# Patient Record
Sex: Female | Born: 1965 | Race: Black or African American | Hispanic: No | Marital: Single | Smoking: Never smoker
Health system: Southern US, Community
[De-identification: ages and names within clinical notes are randomized; demographics above are authoritative.]

## PROBLEM LIST (undated history)

## (undated) DIAGNOSIS — E119 Type 2 diabetes mellitus without complications: Secondary | ICD-10-CM

## (undated) DIAGNOSIS — G8250 Quadriplegia, unspecified: Secondary | ICD-10-CM

## (undated) DIAGNOSIS — I10 Essential (primary) hypertension: Secondary | ICD-10-CM

## (undated) HISTORY — PX: JOINT REPLACEMENT: SHX530

## (undated) HISTORY — PX: CERVICAL SPINE SURGERY: SHX589

---

## 2019-09-26 NOTE — ED Provider Notes (Signed)
 [facility] Emergency Department Provider Note  Initial Impression/Assessment and Plan/ED Course   Wendy Nelson is a 54 y.o. female who p/w SOB. Exam: VS WNL. Well-appearing obese middle-aged female. NAD. Speaks in complete sentences. WOB normal. LLL field crackles. No other adventitious lung sounds. No m/r/g. No JVD. Abdo soft, benign. Lwr ext symmetric and non-edematous. Consider PE, new onset CHF, URI, ACS, other. Plan: ECG, CBM, CT PE, BNP, C19 rapid, other basic labs, reassess.    12:55 PM ECG (my interpretation): NSR (HR 70), PR/QRS/QT intervals nml, QW V1-2, NAIC.    1:31 PM CBC w/o anemia or leukocytosis. Rapid C19 negative.   3:15 PM CMP w/ glucose 69, other values not acutely concerning. Trop neg. Minor elevation CK - 820. BNP 50. Unlikely CHF. Will encourage PO hydration, given this.   4:00 PM CT PE read as 1. No evidence of pulmonary emboli. 2. Scattered mild groundglass opacities of the periphery of both lungs with differential considerations including atypical infection, unusual  inflammatory conditions, and atypical edema. 3. Deep tissue locules of the right chest wall. Correlation for any history of trauma or other explanation is recommended.   5:08 PM Procalc < 0.10. ABX not recommended. Unclear etiology of GGOs. PT updated re: above. Will discharge w/ strict return precautions and recommendation that pt f/u w/ Pulmonology per my referral. No e/o acute life-threatening process at this time.   Exam, labs, imaging results, interventions, and POC discussed w/ pt (and/or family/caregivers) who understand and agree and have no further questions. Given above, will proceed w/ stated plan.  Chief Complaint  Shortness of Breath  History of Present Illness  Wendy Nelson is a 54 y.o. female c PMH CAD, HTN, HLD, obesity, GERD, left knee melanoma, needle phobia, and anxiety who p/w SOB. Onset 3d ago. No rhinorrhea, or sore throat. Nighttime congestion and cough productive of  clear sputum. No fever. No chest pain. No orthopnea. 5# weight gain in last month. No lower extremity swelling. Recently quit smoking cigarettes 4d ago. No vaping or cannabis. Pt endorses audible wheezing. Does not carry formal dx of asthma. Of note, pt had rotator cuff surgery 3d ago as well. No complications reported. Not currently in pain. No previous VTE.   Past Medical History/Past Surgical History   Past Medical History:  Diagnosis Date  . Anxiety due to invasive procedure   . Cancer (CMS-HCC)    melanona left knee removed in 2002  . Coronary artery disease involving native coronary artery of native heart without angina pectoris 03/07/2017  . GERD (gastroesophageal reflux disease)   . Hyperlipidemia 11/29/2016  . Hypertension   . Motion sickness   . Needle phobia   . Obesity (BMI 30-39.9), unspecified   . Primary osteoarthritis of right knee 12/01/2014  . Vision abnormalities    Past Surgical History:  Procedure Laterality Date  . ARTHROPLASTY TOTAL KNEE Right 12/13/2016   Procedure: ARTHROPLASTY, RIGHT KNEE, CONDYLE AND PLATEAU; MEDIAL AND LATERAL COMPARTMENTSWITH OR WITHOUT PATELLA RESURFACING (TOTAL KNEE ARTHROPLASTY);  Surgeon: Jayson Alm Bars, MD;  Location: [facility] OR;  Service: Orthopedics;  Laterality: Right;  . ARTHROSCOPIC ROTATOR CUFF REPAIR Right 09/24/2019   Procedure: R- ARTHROSCOPY, SHOULDER, SURGICAL; WITH ROTATOR CUFF REPAIR;  Surgeon: Klifto, Christopher Scott, MD;  Location: [facility] OR;  Service: Orthopedics;  Laterality: Right;  . ARTHROSCOPIC SUBACROMIAL DECOMP Right 09/24/2019   Procedure: R- ARTHROSCOPY, SHOULDER, SURGICAL; DECOMPRESSION SUBACROMIAL SPACE W/PARTIAL ACROMIOPLASTY, W/CORACOACROMIAL LIGAMENT RELEASE, WHEN PERFORMED (LIST IN ADDITION TO PRIMARY PROCEDURE);  Surgeon:  Klifto, Lonni Hamilton, MD;  Location: [facility] OR;  Service: Orthopedics;  Laterality: Right;  . ARTHROSCOPY KNEE W/LYSIS ADHESIONS Right 04/18/2017   Procedure: ARTHROSCOPY, RIGHT KNEE,  SURGICAL; WITH LYSIS OF ADHESIONS, WITH MANIPULATION (SEPARATE PROCEDURE);  Surgeon: Taft Jayson Lenis, MD;  Location: [facility] OR;  Service: Orthopedics;  Laterality: Right;  . ARTHROSCOPY SHOULDER W/DEBRIDEMENT Right 09/24/2019   Procedure: R- ARTHROSCOPY, SHOULDER, SURGICAL; DEBRIDEMENT, EXTENSIVE, 3 OR MORE DISCRETE STRUCTURES;  Surgeon: Sandralee Lonni Hamilton, MD;  Location: [facility] OR;  Service: Orthopedics;  Laterality: Right;  . BONE SPUR REMOVAL RIGHT FOOT    . cardiac catheterization  12/2016  . CESAREAN SECTION     THREE  . COLONOSCOPY  2020   diverticuli  . CYST REMOVAL RIGHT WRIST Right   . ESOPHAGOGASTRODOUDENOSCOPY W/BIOPSY N/A 01/09/2019   Procedure: EGD;  Surgeon: Merle Norleen Ricardo Mickey., MD;  Location: [facility];  Service: Gastroenterology;  Laterality: N/A;  . EXCISION TUMOR SOFT TISSUE FOREARM &/OR WRIST Right 05/19/2019   Procedure: EXCISION, TUMOR, SOFT TISSUE OF FOREARM AND/OR WRIST;  Surgeon: Harry Lenis Shaw, MD;  Location: [facility] OR;  Service: Orthopedics;  Laterality: Right;  . JOINT REPLACEMENT Right    knee  . KNEE ARTHROSCOPY Right   . MANIPULATION JOINT UNDER ANESTHESIA KNEE Right 02/07/2017   Procedure: MANIPULATION OF KNEE JOINT UNDER GENERAL ANESTHESIA (INCLUDES APPLICATION OF TRACTION OR OTHER FIXATION DEVICES);  Surgeon: Jayson Lenis Taft, MD;  Location: [facility] OR;  Service: Orthopedics;  Laterality: Right;  . MANIPULATION JOINT UNDER ANESTHESIA KNEE Right 04/18/2017   Procedure: MANIPULATION OF RIGHT KNEE JOINT UNDER GENERAL ANESTHESIA (INCLUDES APPLICATION OF TRACTION OR OTHER FIXATION DEVICES);  Surgeon: Taft Jayson Lenis, MD;  Location: [facility] OR;  Service: Orthopedics;  Laterality: Right;  . NEUROPLASTY &/OR TRANSPOSITION MEDIAN NERVE AT CARPAL TUNNEL Right 01/13/2019   Procedure: NEUROPLASTY AND/OR TRANSPOSITION; MEDIAN NERVE AT CARPAL TUNNEL;  Surgeon: Rocklin Maude Charleston, MD;  Location: [facility] OR;  Service: Neurosurgery;  Laterality: Right;  .  SKIN BIOPSY Left    leg - melanoma  . TENODESIS BICEPS W/TRANSPLANTATION LONG TENDON OPEN Right 09/24/2019   Procedure: R- TENODESIS OF LONG TENDON OF BICEPS;  Surgeon: Klifto, Christopher Scott, MD;  Location: [facility] OR;  Service: Orthopedics;  Laterality: Right;  . TUBAL LIGATION      Medications   Current Outpatient Medications:  .  amlodipine besylate (AMLODIPINE ORAL), Take 5 mg by mouth every morning   , Disp: , Rfl:  .  aspirin  81 MG EC tablet, Take 1 tablet (81 mg total) by mouth 2 (two) times daily for 30 days, Disp: 60 tablet, Rfl: 0 .  atorvastatin  (LIPITOR) 10 MG tablet, Take 10 mg by mouth once daily, Disp: , Rfl:  .  celecoxib (CELEBREX) 200 MG capsule, Take 1 capsule (200 mg total) by mouth 2 (two) times daily for 14 days, Disp: 28 capsule, Rfl: 0 .  cyclobenzaprine (FEXMID) 7.5 MG tablet, Take 1 tablet (7.5 mg total) by mouth once daily as needed for Muscle spasms Take in evening, Disp: 30 tablet, Rfl: 5 .  dicyclomine  (BENTYL ) 10 mg capsule, Take 1 capsule (10 mg total) by mouth 4 (four) times daily before meals and nightly, Disp: 30 capsule, Rfl: 11 .  DULoxetine  (CYMBALTA ) 30 MG DR capsule, Take 1 capsule (30 mg total) by mouth once daily, Disp: 90 capsule, Rfl: 2 .  fluorometholone  (FML) 0.1 % ophthalmic suspension, 1 drop as needed   , Disp: , Rfl:  .  hydroCHLOROthiazide (HYDRODIURIL) 25 MG  tablet, Take 25 mg by mouth every morning   , Disp: , Rfl:  .  losartan (COZAAR) 100 MG tablet, Take 100 mg by mouth every morning   , Disp: , Rfl:  .  omeprazole (PRILOSEC) 20 MG DR capsule, Take 20 mg by mouth once daily   , Disp: , Rfl:  .  ondansetron  (ZOFRAN ) 4 MG tablet, once daily as needed, Disp: , Rfl:  .  ondansetron  (ZOFRAN ) 4 MG tablet, Take 1 tablet (4 mg total) by mouth every 8 (eight) hours as needed for Nausea for up to 7 days, Disp: 20 tablet, Rfl: 0 .  oxyCODONE  (ROXICODONE ) 5 MG immediate release tablet, Take 1-2 tablets (5-10 mg total) by mouth every 4 (four) hours as  needed for Pain (1 tab moderate pain, 2 tabs severe pain) for up to 30 doses, Disp: 42 tablet, Rfl: 0 .  pregabalin  (LYRICA ) 150 MG capsule, Take 1 capsule (150 mg total) by mouth 2 (two) times daily, Disp: 60 capsule, Rfl: 5 .  sennosides (SENOKOT) 8.6 mg tablet, Take 1 tablet by mouth once daily, Disp: 30 tablet, Rfl: 0 .  traZODone  (DESYREL ) 50 MG tablet, Take 1 tablet (50 mg total) by mouth nightly as needed for Sleep, Disp: 90 tablet, Rfl: 2  Allergies  Melatonin and Neomycin sulfate  Family History   Family History  Problem Relation Age of Onset  . Diabetes type II Mother   . High blood pressure (Hypertension) Mother   . Diabetes Brother        diabetic coma twin brother  . Anesthesia problems Neg Hx   . Malignant hyperthermia Neg Hx     Social History   Social History   Tobacco Use  . Smoking status: Former Smoker    Packs/day: 0.25    Years: 15.00    Pack years: 3.75    Types: Cigarettes    Quit date: 09/23/2019  . Smokeless tobacco: Never Used  . Tobacco comment: 4-5 cigarettes a day  Substance Use Topics  . Alcohol  use: Yes    Alcohol /week: 6.0 standard drinks    Types: 6 Cans of beer per week    Drinks per session: 1 or 2    Binge frequency: Never    Comment: occ  . Drug use: No    Review of Systems   Constitutional: Negative for fever. Eyes: Negative for visual changes. ENT: Negative for sore throat. Positive for cough and congestion.  Cardiovascular: Negative for chest pain. Respiratory: Positive for shortness of breath. Gastrointestinal: Negative for abdominal pain, vomiting or diarrhea. Genitourinary: Negative for dysuria. Musculoskeletal: Negative for back pain. Skin: Negative for rash. Neurological: Negative for headache, focal weakness or numbness.  All ROS reviewed and otherwise negative  Physical Exam   VITAL SIGNS:   ED Triage Vitals [09/26/19 1203]  Enc Vitals Group     BP 124/85     Heart Rate 66     Resp 18     Temp 36.6 C  (97.9 F)     Temp Source Oral     SpO2 98 %     Weight      Height      Head Circumference      Peak Flow      Pain Score      Pain Loc      Pain Edu?      Excl. in GC?     Constitutional: Well-appearing obese middle-aged female. NAD. AOx3.  Eyes: PERRL. Conjunctiva clear.  HEENT: NCAT. MMM. Trachea midline.  Cardiovascular: No m/r/g. 2+ symmetric radial pulses. Respiratory: Speaks in complete sentences. WOB normal. LLL field crackles. No other adventitious lung sounds. Gastrointestinal: No TTP. No peritoneal signs.  Musculoskeletal: No ext edema or TTP. Neurologic: No gross focal deficits. MAE.  Skin: Warm, dry, intact.  Psychiatric: Mood/affect normal. Speech/behavior normal.  ECG   ECG (my interpretation): NSR (HR 70), PR/QRS/QT intervals nml, QW V1-2, NAIC.    Radiology   CT PE read as 1. No evidence of pulmonary emboli. 2. Scattered mild groundglass opacities of the periphery of both lungs with differential considerations including atypical infection, unusual  inflammatory conditions, and atypical edema. 3. Deep tissue locules of the right chest wall. Correlation for any history of trauma or other explanation is recommended.   Attestation   Pertinent labs & imaging results that were available during my care of the patient were reviewed by me and considered in my medical decision making (see chart for details).      Jolee Fonda Bumps, MD 09/26/19 [phone]

## 2020-09-13 DIAGNOSIS — I959 Hypotension, unspecified: Secondary | ICD-10-CM | POA: Diagnosis present

## 2020-09-13 DIAGNOSIS — G9511 Acute infarction of spinal cord (embolic) (nonembolic): Secondary | ICD-10-CM | POA: Diagnosis present

## 2020-09-13 DIAGNOSIS — M4712 Other spondylosis with myelopathy, cervical region: Secondary | ICD-10-CM | POA: Diagnosis present

## 2020-09-13 DIAGNOSIS — I639 Cerebral infarction, unspecified: Secondary | ICD-10-CM

## 2020-09-13 HISTORY — DX: Cerebral infarction, unspecified: I63.9

## 2020-10-19 ENCOUNTER — Observation Stay: Payer: Medicare Other

## 2020-10-19 ENCOUNTER — Inpatient Hospital Stay
Admission: EM | Admit: 2020-10-19 | Discharge: 2020-10-21 | DRG: 682 | Disposition: A | Discharge status: Skilled Nursing Facility | Payer: Medicare Other | Source: Skilled Nursing Facility | Attending: Internal Medicine | Admitting: Internal Medicine

## 2020-10-19 ENCOUNTER — Other Ambulatory Visit: Payer: Self-pay

## 2020-10-19 DIAGNOSIS — Z87440 Personal history of urinary (tract) infections: Secondary | ICD-10-CM

## 2020-10-19 DIAGNOSIS — N39490 Overflow incontinence: Secondary | ICD-10-CM

## 2020-10-19 DIAGNOSIS — Z888 Allergy status to other drugs, medicaments and biological substances status: Secondary | ICD-10-CM

## 2020-10-19 DIAGNOSIS — I129 Hypertensive chronic kidney disease with stage 1 through stage 4 chronic kidney disease, or unspecified chronic kidney disease: Secondary | ICD-10-CM | POA: Diagnosis present

## 2020-10-19 DIAGNOSIS — N1330 Unspecified hydronephrosis: Secondary | ICD-10-CM | POA: Diagnosis present

## 2020-10-19 DIAGNOSIS — N182 Chronic kidney disease, stage 2 (mild): Secondary | ICD-10-CM

## 2020-10-19 DIAGNOSIS — M4712 Other spondylosis with myelopathy, cervical region: Secondary | ICD-10-CM | POA: Diagnosis not present

## 2020-10-19 DIAGNOSIS — E785 Hyperlipidemia, unspecified: Secondary | ICD-10-CM | POA: Diagnosis present

## 2020-10-19 DIAGNOSIS — F419 Anxiety disorder, unspecified: Secondary | ICD-10-CM | POA: Diagnosis present

## 2020-10-19 DIAGNOSIS — Z981 Arthrodesis status: Secondary | ICD-10-CM

## 2020-10-19 DIAGNOSIS — Z8673 Personal history of transient ischemic attack (TIA), and cerebral infarction without residual deficits: Secondary | ICD-10-CM

## 2020-10-19 DIAGNOSIS — N319 Neuromuscular dysfunction of bladder, unspecified: Secondary | ICD-10-CM

## 2020-10-19 DIAGNOSIS — Z882 Allergy status to sulfonamides status: Secondary | ICD-10-CM

## 2020-10-19 DIAGNOSIS — M4722 Other spondylosis with radiculopathy, cervical region: Secondary | ICD-10-CM

## 2020-10-19 DIAGNOSIS — F444 Conversion disorder with motor symptom or deficit: Secondary | ICD-10-CM | POA: Diagnosis present

## 2020-10-19 DIAGNOSIS — I10 Essential (primary) hypertension: Secondary | ICD-10-CM | POA: Diagnosis present

## 2020-10-19 DIAGNOSIS — F418 Other specified anxiety disorders: Secondary | ICD-10-CM | POA: Diagnosis present

## 2020-10-19 DIAGNOSIS — G9511 Acute infarction of spinal cord (embolic) (nonembolic): Secondary | ICD-10-CM | POA: Diagnosis present

## 2020-10-19 DIAGNOSIS — F32A Depression, unspecified: Secondary | ICD-10-CM | POA: Diagnosis present

## 2020-10-19 DIAGNOSIS — I959 Hypotension, unspecified: Secondary | ICD-10-CM | POA: Diagnosis not present

## 2020-10-19 DIAGNOSIS — I700 Atherosclerosis of aorta: Secondary | ICD-10-CM | POA: Diagnosis present

## 2020-10-19 DIAGNOSIS — R55 Syncope and collapse: Secondary | ICD-10-CM

## 2020-10-19 DIAGNOSIS — N179 Acute kidney failure, unspecified: Secondary | ICD-10-CM | POA: Diagnosis not present

## 2020-10-19 DIAGNOSIS — N178 Other acute kidney failure: Secondary | ICD-10-CM | POA: Diagnosis not present

## 2020-10-19 DIAGNOSIS — Z8249 Family history of ischemic heart disease and other diseases of the circulatory system: Secondary | ICD-10-CM

## 2020-10-19 DIAGNOSIS — Z20822 Contact with and (suspected) exposure to covid-19: Secondary | ICD-10-CM | POA: Diagnosis present

## 2020-10-19 DIAGNOSIS — I2510 Atherosclerotic heart disease of native coronary artery without angina pectoris: Secondary | ICD-10-CM | POA: Diagnosis present

## 2020-10-19 LAB — URINALYSIS, COMPLETE (UACMP) WITH MICROSCOPIC
Bilirubin Urine: NEGATIVE
Glucose, UA: NEGATIVE mg/dL
Hgb urine dipstick: NEGATIVE
Ketones, ur: NEGATIVE mg/dL
Leukocytes,Ua: NEGATIVE
Nitrite: NEGATIVE
Protein, ur: NEGATIVE mg/dL
Specific Gravity, Urine: 1.01 (ref 1.005–1.030)
pH: 5 (ref 5.0–8.0)

## 2020-10-19 LAB — CBC WITH DIFFERENTIAL/PLATELET
Abs Immature Granulocytes: 0.03 10*3/uL (ref 0.00–0.07)
Basophils Absolute: 0.1 10*3/uL (ref 0.0–0.1)
Basophils Relative: 1 %
Eosinophils Absolute: 0.7 10*3/uL — ABNORMAL HIGH (ref 0.0–0.5)
Eosinophils Relative: 9 %
HCT: 30.5 % — ABNORMAL LOW (ref 36.0–46.0)
Hemoglobin: 10.5 g/dL — ABNORMAL LOW (ref 12.0–15.0)
Immature Granulocytes: 0 %
Lymphocytes Relative: 21 %
Lymphs Abs: 1.5 10*3/uL (ref 0.7–4.0)
MCH: 29.1 pg (ref 26.0–34.0)
MCHC: 34.4 g/dL (ref 30.0–36.0)
MCV: 84.5 fL (ref 80.0–100.0)
Monocytes Absolute: 0.7 10*3/uL (ref 0.1–1.0)
Monocytes Relative: 9 %
Neutro Abs: 4.6 10*3/uL (ref 1.7–7.7)
Neutrophils Relative %: 60 %
Platelets: 214 10*3/uL (ref 150–400)
RBC: 3.61 MIL/uL — ABNORMAL LOW (ref 3.87–5.11)
RDW: 13.5 % (ref 11.5–15.5)
WBC: 7.5 10*3/uL (ref 4.0–10.5)
nRBC: 0 % (ref 0.0–0.2)

## 2020-10-19 LAB — COMPREHENSIVE METABOLIC PANEL
ALT: 21 U/L (ref 0–44)
AST: 20 U/L (ref 15–41)
Albumin: 2.8 g/dL — ABNORMAL LOW (ref 3.5–5.0)
Alkaline Phosphatase: 82 U/L (ref 38–126)
Anion gap: 14 (ref 5–15)
BUN: 102 mg/dL — ABNORMAL HIGH (ref 6–20)
CO2: 22 mmol/L (ref 22–32)
Calcium: 8.6 mg/dL — ABNORMAL LOW (ref 8.9–10.3)
Chloride: 96 mmol/L — ABNORMAL LOW (ref 98–111)
Creatinine, Ser: 4.58 mg/dL — ABNORMAL HIGH (ref 0.44–1.00)
GFR, Estimated: 11 mL/min — ABNORMAL LOW (ref >60)
Glucose, Bld: 96 mg/dL (ref 70–99)
Potassium: 4.1 mmol/L (ref 3.5–5.1)
Sodium: 132 mmol/L — ABNORMAL LOW (ref 135–145)
Total Bilirubin: 0.9 mg/dL (ref 0.3–1.2)
Total Protein: 6.6 g/dL (ref 6.5–8.1)

## 2020-10-19 LAB — TROPONIN I (HIGH SENSITIVITY)
Troponin I (High Sensitivity): 8 ng/L (ref <18)
Troponin I (High Sensitivity): 6 ng/L (ref <18)

## 2020-10-19 LAB — BRAIN NATRIURETIC PEPTIDE: B Natriuretic Peptide: 30.9 pg/mL (ref 0.0–100.0)

## 2020-10-19 LAB — MRSA PCR SCREENING: MRSA by PCR: NEGATIVE

## 2020-10-19 LAB — RESP PANEL BY RT-PCR (FLU A&B, COVID) ARPGX2
Influenza A by PCR: NEGATIVE
Influenza B by PCR: NEGATIVE
SARS Coronavirus 2 by RT PCR: NEGATIVE

## 2020-10-19 LAB — HIV ANTIBODY (ROUTINE TESTING W REFLEX): HIV Screen 4th Generation wRfx: NONREACTIVE

## 2020-10-19 MED ORDER — ACETAMINOPHEN 325 MG PO TABS
650.0000 mg | ORAL_TABLET | Freq: Four times a day (QID) | ORAL | Status: DC | PRN
  Administered 2020-10-19 – 2020-10-21 (×3): 650 mg via ORAL
  Filled 2020-10-19 (×4): qty 2

## 2020-10-19 MED ORDER — ENOXAPARIN SODIUM 40 MG/0.4ML ~~LOC~~ SOLN
40.0000 mg | SUBCUTANEOUS | Status: DC
  Administered 2020-10-19 – 2020-10-20 (×2): 40 mg via SUBCUTANEOUS
  Filled 2020-10-19 (×2): qty 0.4

## 2020-10-19 MED ORDER — MIDODRINE HCL 5 MG PO TABS
7.5000 mg | ORAL_TABLET | Freq: Three times a day (TID) | ORAL | Status: DC
  Administered 2020-10-20 – 2020-10-21 (×5): 7.5 mg via ORAL
  Filled 2020-10-19 (×5): qty 2

## 2020-10-19 MED ORDER — ATORVASTATIN CALCIUM 10 MG PO TABS
10.0000 mg | ORAL_TABLET | Freq: Every day | ORAL | Status: DC
  Administered 2020-10-20 – 2020-10-21 (×2): 10 mg via ORAL
  Filled 2020-10-19 (×2): qty 1

## 2020-10-19 MED ORDER — DULOXETINE HCL 30 MG PO CPEP
60.0000 mg | ORAL_CAPSULE | Freq: Every day | ORAL | Status: DC
  Administered 2020-10-20 – 2020-10-21 (×2): 60 mg via ORAL
  Filled 2020-10-19 (×2): qty 2

## 2020-10-19 MED ORDER — PANTOPRAZOLE SODIUM 40 MG PO TBEC
40.0000 mg | DELAYED_RELEASE_TABLET | Freq: Two times a day (BID) | ORAL | Status: DC
  Administered 2020-10-19 – 2020-10-21 (×4): 40 mg via ORAL
  Filled 2020-10-19 (×4): qty 1

## 2020-10-19 MED ORDER — LACTATED RINGERS IV BOLUS
1000.0000 mL | Freq: Once | INTRAVENOUS | Status: AC
  Administered 2020-10-19: 1000 mL via INTRAVENOUS

## 2020-10-19 MED ORDER — ONDANSETRON HCL 4 MG/2ML IJ SOLN: 4.0000 mg | Freq: Three times a day (TID) | INTRAMUSCULAR | Status: DC | PRN

## 2020-10-19 MED ORDER — MIRTAZAPINE 15 MG PO TABS
15.0000 mg | ORAL_TABLET | Freq: Every day | ORAL | Status: DC
  Administered 2020-10-19 – 2020-10-20 (×2): 15 mg via ORAL
  Filled 2020-10-19 (×2): qty 1

## 2020-10-19 MED ORDER — SODIUM CHLORIDE 0.9 % IV BOLUS
500.0000 mL | Freq: Once | INTRAVENOUS | Status: AC
  Administered 2020-10-19: 500 mL via INTRAVENOUS

## 2020-10-19 MED ORDER — PREGABALIN 50 MG PO CAPS
100.0000 mg | ORAL_CAPSULE | Freq: Every day | ORAL | Status: DC
  Administered 2020-10-19 – 2020-10-20 (×2): 100 mg via ORAL
  Filled 2020-10-19 (×2): qty 2

## 2020-10-19 MED ORDER — LIDOCAINE 5 % EX PTCH
1.0000 | MEDICATED_PATCH | CUTANEOUS | Status: DC
  Administered 2020-10-19 – 2020-10-20 (×2): 1 via TRANSDERMAL
  Filled 2020-10-19 (×4): qty 1

## 2020-10-19 MED ORDER — ASPIRIN EC 325 MG PO TBEC: 325.0000 mg | DELAYED_RELEASE_TABLET | Freq: Every day | ORAL | Status: DC

## 2020-10-19 MED ORDER — LACTATED RINGERS IV SOLN: INTRAVENOUS | Status: DC

## 2020-10-19 MED ORDER — TRAZODONE HCL 50 MG PO TABS
50.0000 mg | ORAL_TABLET | Freq: Every day | ORAL | Status: DC
  Administered 2020-10-19 – 2020-10-20 (×2): 50 mg via ORAL
  Filled 2020-10-19 (×2): qty 1

## 2020-10-19 NOTE — ED Triage Notes (Addendum)
From [facility], syncopal episode after PT (did not hit head) BP dropped to 80s. EMS BP was 120s SBP. 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 C collar in place Per EMS, Pt usually talks in full sentences but curently only answering yes,no to questions No CP, negative cardiac hx but ST depression seen on EMS EKG CBG 117, 20g in R hand EDP at bedside. Pt currently answering questions in full sentences. Pt states has not been able to walk since stroke and has limited motor function on R side.

## 2020-10-19 NOTE — H&P (Addendum)
History and Physical    Wendy Nelson BHA:[id number] DOB: Jan 13, 1966 DOA: 10/19/2020  Referring MD/NP/PA:   PCP: System, Provider Not In   Patient coming from:  The patient is coming from rehab.     Chief Complaint: syncope  HPI: Wendy Nelson is a 55 y.o. 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  Pt has hx of cervical spondylosis with myelopathy and radiculopathy. She was recently admitted to [facility] and had surgical intervention in the form of a C5-7 laminectomy and fusion with right-sided foraminotomies. After the surgery, pt developed weakness in all extremities, MRI of cervical spine on 09/25/2020 showed possible cervical spine spinal cord infarction. Pt is currently doing rehab.  For her daughter at the bedside, patient passed out at about 9 AM when she was doing physical therapy.  She passed out for few seconds. Staff further was assisting her, she did not fall, did not hit her head. She has weakness in all extremities which has not changed. No facial droop or slurred speech.  Patient has lightheadedness. Pt has confusion.  No chest pain, cough, shortness breath, fever or chills.  No nausea, vomiting, diarrhea or abdominal pain, no symptoms of UTI. Overall feels that her neck pain is decreasing since her surgery. Per report, pt had hypotension with systolic blood pressure 80s.  Her blood pressure is 107/76 the ED.  ED Course: pt was found to have WBC 7.5, troponin level 8, BNP 30.9, negative Covid PCR, urinalysis (clear appearance, negative leukocyte, many bacteria, WBC 0-5), worsening renal function with creatinine up from baseline 1.0 on 10/06/2018 ~4.58, BUN 102, temperature normal, blood pressure 107/76, heart rate 66, RR 16, oxygen saturation 97% on room air.  CT of head is  negative for acute intracranial abnormalities.  Patient is placed on progressive benefit observation.  Dr. Apolinar Junes of urology is consulted.  US-renal: Moderate bilateral hydronephrosis without shadowing stones. Ureteral jets are noted.  CT-renal stone study: 1. Moderately distended urinary bladder with mild to moderate bilateral hydronephrosis possibly related to chronic bladder dysfunction. Clinical correlation is recommended. No kidney stones. 2. No bowel obstruction. Normal appendix. 3. Aortic Atherosclerosis (ICD10-I70.0).  Review of Systems:   General: no fevers, chills, no body weight gain, has fatigue HEENT: no blurry vision, hearing changes or sore throat Respiratory: no dyspnea, coughing, wheezing CV: no chest pain, no palpitations GI: no nausea, vomiting, abdominal pain, diarrhea, constipation GU: no dysuria, burning on urination, increased urinary frequency, hematuria  Ext: no leg edema Neuro: Has confusion and syncope.  Has weakness in all extremities. Skin: no rash, no skin tear. MSK:  Has neck pain, with collar Heme: No easy bruising.  Travel history: No recent long distant travel.  Allergy:  Allergies  Allergen Reactions  . Melatonin   . Sulfa Antibiotics     Past Medical History:  Diagnosis Date  . Stroke [facility]) 09/2020    Past Surgical History:  Procedure Laterality Date  . CERVICAL SPINE SURGERY      Social History:  reports that she has never smoked. She has never used smokeless tobacco. No history on file for alcohol use and drug use.  Family History:  Family History  Problem Relation Age of Onset  . Diabetes Mellitus II Mother   . Hypertension Mother   . Diabetes Mellitus II Brother      Prior to Admission  medications   Not on File    Physical Exam: Vitals:   10/19/20 1100 10/19/20 1230 10/19/20 1330 10/19/20 1530  BP: 107/76 128/90 131/90 113/74  Pulse: 66 63 (!) 58 64  Resp: 16 18 15 18   Temp: 98.1 F (36.7 C)   98.5 F (36.9 C)   TempSrc: Oral     SpO2: 97% 97% 99% 100%  Weight:      Height:       General: Not in acute distress HEENT:       Eyes: PERRL, EOMI, no scleral icterus.       ENT: No discharge from the ears and nose.       Neck: No JVD, no bruit, no mass felt. Heme: No neck lymph node enlargement. Cardiac: S1/S2, RRR, No murmurs, No gallops or rubs. Respiratory: No rales, wheezing, rhonchi or rubs. GI: Soft, nondistended, nontender, no organomegaly, BS present. GU: No hematuria Ext: No pitting leg edema bilaterally. 1+DP/PT pulse bilaterally. Musculoskeletal: wearing C-collar Skin: No rashes.  Neuro: has mild confusion, but still oriented X3, cranial nerves II-XII grossly intact. Has weakness in all extremities, slightly move all extremities Psych: Patient is not psychotic, no suicidal or hemocidal ideation.  Labs on Admission: I have personally reviewed following labs and imaging studies  CBC: Recent Labs  Lab 10/19/20 1113  WBC 7.5  NEUTROABS 4.6  HGB 10.5*  HCT 30.5*  MCV 84.5  PLT 214   Basic Metabolic Panel: Recent Labs  Lab 10/19/20 1113  NA 132*  K 4.1  CL 96*  CO2 22  GLUCOSE 96  BUN 102*  CREATININE 4.58*  CALCIUM 8.6*   GFR: Estimated Creatinine Clearance: 18.9 mL/min (A) (by C-G formula based on SCr of 4.58 mg/dL (H)). Liver Function Tests: Recent Labs  Lab 10/19/20 1113  AST 20  ALT 21  ALKPHOS 82  BILITOT 0.9  PROT 6.6  ALBUMIN 2.8*   No results for input(s): LIPASE, AMYLASE in the last 168 hours. No results for input(s): AMMONIA in the last 168 hours. Coagulation Profile: No results for input(s): INR, PROTIME in the last 168 hours. Cardiac Enzymes: No results for input(s): CKTOTAL, CKMB, CKMBINDEX, TROPONINI in the last 168 hours. BNP (last 3 results) No results for input(s): PROBNP in the last 8760 hours. HbA1C: No results for input(s): HGBA1C in the last 72 hours. CBG: No results for input(s): GLUCAP in the last 168 hours. Lipid Profile: No  results for input(s): CHOL, HDL, LDLCALC, TRIG, CHOLHDL, LDLDIRECT in the last 72 hours. Thyroid Function Tests: No results for input(s): TSH, T4TOTAL, FREET4, T3FREE, THYROIDAB in the last 72 hours. Anemia Panel: No results for input(s): VITAMINB12, FOLATE, FERRITIN, TIBC, IRON, RETICCTPCT in the last 72 hours. Urine analysis:    Component Value Date/Time   COLORURINE YELLOW (A) 10/19/2020 1645   APPEARANCEUR CLEAR (A) 10/19/2020 1645   LABSPEC 1.010 10/19/2020 1645   PHURINE 5.0 10/19/2020 1645   GLUCOSEU NEGATIVE 10/19/2020 1645   HGBUR NEGATIVE 10/19/2020 1645   BILIRUBINUR NEGATIVE 10/19/2020 1645   KETONESUR NEGATIVE 10/19/2020 1645   PROTEINUR NEGATIVE 10/19/2020 1645   NITRITE NEGATIVE 10/19/2020 1645   LEUKOCYTESUR NEGATIVE 10/19/2020 1645   Sepsis Labs: @LABRCNTIP (procalcitonin:4,lacticidven:4) ) Recent Results (from the past 240 hour(s))  Resp Panel by RT-PCR (Flu A&B, Covid) Nasopharyngeal Swab     Status: None   Collection Time: 10/19/20  1:45 PM   Specimen: Nasopharyngeal Swab; Nasopharyngeal(NP) swabs in vial transport medium  Result Value Ref Range Status   SARS Coronavirus  2 by RT PCR NEGATIVE NEGATIVE Final    Comment: (NOTE) SARS-CoV-2 target nucleic acids are NOT DETECTED.  The SARS-CoV-2 RNA is generally detectable in upper respiratory specimens during the acute phase of infection. The lowest concentration of SARS-CoV-2 viral copies this assay can detect is 138 copies/mL. A negative result does not preclude SARS-Cov-2 infection and should not be used as the sole basis for treatment or other patient management decisions. A negative result may occur with  improper specimen collection/handling, submission of specimen other than nasopharyngeal swab, presence of viral mutation(s) within the areas targeted by this assay, and inadequate number of viral copies(<138 copies/mL). A negative result must be combined with clinical observations, patient history, and  epidemiological information. The expected result is Negative.  Fact Sheet for Patients:  BloggerCourse.comhttps://www.fda.gov/media/152166/download  Fact Sheet for Healthcare Providers:  SeriousBroker.ithttps://www.fda.gov/media/152162/download  This test is no t yet approved or cleared by the [place] FDA and  has been authorized for detection and/or diagnosis of SARS-CoV-2 by FDA under an Emergency Use Authorization (EUA). This EUA will remain  in effect (meaning this test can be used) for the duration of the COVID-19 declaration under Section 564(b)(1) of the Act, 21 U.S.C.section 360bbb-3(b)(1), unless the authorization is terminated  or revoked sooner.       Influenza A by PCR NEGATIVE NEGATIVE Final   Influenza B by PCR NEGATIVE NEGATIVE Final    Comment: (NOTE) The Xpert Xpress SARS-CoV-2/FLU/RSV plus assay is intended as an aid in the diagnosis of influenza from Nasopharyngeal swab specimens and should not be used as a sole basis for treatment. Nasal washings and aspirates are unacceptable for Xpert Xpress SARS-CoV-2/FLU/RSV testing.  Fact Sheet for Patients: BloggerCourse.comhttps://www.fda.gov/media/152166/download  Fact Sheet for Healthcare Providers: SeriousBroker.ithttps://www.fda.gov/media/152162/download  This test is not yet approved or cleared by the [place] FDA and has been authorized for detection and/or diagnosis of SARS-CoV-2 by FDA under an Emergency Use Authorization (EUA). This EUA will remain in effect (meaning this test can be used) for the duration of the COVID-19 declaration under Section 564(b)(1) of the Act, 21 U.S.C. section 360bbb-3(b)(1), unless the authorization is terminated or revoked.  Performed at [facility], [address] [place], [place] [address]   MRSA PCR Screening     Status: None   Collection Time: 10/19/20  4:56 PM   Specimen: Urine, Catheterized; Nasopharyngeal  Result Value Ref Range Status   MRSA by PCR NEGATIVE NEGATIVE Final    Comment:        The  GeneXpert MRSA Assay (FDA approved for NASAL specimens only), is one component of a comprehensive MRSA colonization surveillance program. It is not intended to diagnose MRSA infection nor to guide or monitor treatment for MRSA infections. Performed at [facility], [address] [place], [place] [address]      Radiological Exams on Admission: CT HEAD WO CONTRAST  Result Date: 10/19/2020 CLINICAL DATA:  Syncope EXAM: CT HEAD WITHOUT CONTRAST TECHNIQUE: Contiguous axial images were obtained from the base of the skull through the vertex without intravenous contrast. COMPARISON:  Head CT July 21, 2020 FINDINGS: Brain: No evidence of acute large vascular territory infarction, hemorrhage, hydrocephalus, extra-axial collection or mass lesion/mass effect. Vascular: No hyperdense vessel or unexpected calcification. Skull: Normal. Negative for fracture or focal lesion. Sinuses/Orbits: The paranasal sinuses and mastoid air cells are predominantly clear. Orbits are grossly unremarkable. Other: None IMPRESSION: No acute intracranial findings. Electronically Signed   By: Maudry MayhewJeffrey  Waltz MD   On: 10/19/2020 14:30  US RENAL  Result Date: 10/19/2020 CLINICAL DATA:  Acute kidney injury EXAM: RENAL / URINARY TRACT ULTRASOUND COMPLETE COMPARISON:  None. FINDINGS: Right Kidney: Renal measurements: 11.1 x 5.3 x 5.2 cm = volume: 157 mL. Echogenicity within normal limits. Moderate hydronephrosis is seen. No renal or collecting system calculi. Left Kidney: Renal measurements: 1.9 x 6.4 x 5.8 cm = volume: 233 mL. Echogenicity within normal limits. Moderate hydronephrosis is noted. Bladder: Appears normal for degree of bladder distention. Bilateral ureteral jets are seen. Other: None. IMPRESSION: Moderate bilateral hydronephrosis without shadowing stones. Ureteral jets are noted. Electronically Signed   By: Jonna Clark M.D.   On: 10/19/2020 15:33   CT RENAL STONE STUDY  Result Date: 10/19/2020 CLINICAL  DATA:  55 year old female with hydronephrosis. Concern for kidney stone. EXAM: CT ABDOMEN AND PELVIS WITHOUT CONTRAST TECHNIQUE: Multidetector CT imaging of the abdomen and pelvis was performed following the standard protocol without IV contrast. COMPARISON:  Renal ultrasound dated 10/19/2020. FINDINGS: Evaluation of this exam is limited in the absence of intravenous contrast. Lower chest: Left lung base linear atelectasis/scarring. A 2 cm focal subpleural nodularity at the left lung base, likely scarring. Attention on follow-up imaging recommended. No intra-abdominal free air or free fluid. Hepatobiliary: No focal liver abnormality is seen. No gallstones, gallbladder wall thickening, or biliary dilatation. Pancreas: Unremarkable. No pancreatic ductal dilatation or surrounding inflammatory changes. Spleen: Normal in size without focal abnormality. Adrenals/Urinary Tract: The adrenal glands unremarkable. Mild to moderate bilateral hydronephrosis. No stone identified. There is mild bilateral hydroureter. There is moderate distension of the bladder with slightly irregular and trabeculated bladder wall which may be related to chronic bladder dysfunction. Clinical correlation is recommended. Stomach/Bowel: No bowel obstruction or active inflammation. The appendix is normal. Vascular/Lymphatic: Mild aortoiliac atherosclerotic disease. The IVC is unremarkable. No portal venous gas. There is no adenopathy. Reproductive: The uterus is grossly unremarkable. No adnexal masses. Other: None Musculoskeletal: Bilateral sacroiliitis. No acute osseous pathology. IMPRESSION: 1. Moderately distended urinary bladder with mild to moderate bilateral hydronephrosis possibly related to chronic bladder dysfunction. Clinical correlation is recommended. No kidney stones. 2. No bowel obstruction. Normal appendix. 3. Aortic Atherosclerosis (ICD10-I70.0). Electronically Signed   By: Elgie Collard M.D.   On: 10/19/2020 16:43     EKG: I  have personally reviewed.  Sinus rhythm, QTC 469, T wave inversion in V1-V4, Q waves in inferior leads.   Assessment/Plan Principal Problem:   Syncope Active Problems:   Acute renal failure superimposed on stage 2 chronic kidney disease (HCC)   Hypotension   Cervical spondylosis with myelopathy and radiculopathy   Spinal cord infarction (HCC)   Bilateral hydronephrosis   HTN (hypertension)   HLD (hyperlipidemia)   Syncope: Likely due to hypotension.  CT head is negative for acute intracranial abnormalities.  The hypotension possibly due to dehydration, but not sure if it is related to spinal cord infarction.  Blood pressure improved to 107/76 currently.  -Placed on progressive bed for position -Check orthostatic status -IV fluid: 1 L LR, 500 cc normal saline, followed by 100 cc per hour of LR -Frequent neuro check  Acute renal failure superimposed on stage 2 chronic kidney disease due to bilateral hydronephrosis: Dr. Apolinar Junes of urology is consulted. -Foley catheter placed -Follow-up urine culture  Hypotension: -IV fluid as above -midodrine 7.5 mg tid  Cervical spondylosis with myelopathy and radiculopathy and spinal cord infarction [facility]): -continue PT/OT when pt is back to rehab -continue lyric, tizannidine, prn percocet -ASA and lipitor for spinal cord infarction  HTN (hypertension) -Hold blood pressure medications due to hypotension  HLD (hyperlipidemia) -Lipitor  Depression and anxiety: Stable, no suicidal or homicidal ideations. -Continue home medications          DVT ppx: SQ Lovenox Code Status: Full code Family Communication:    Yes, patient's  daughter at bed side Disposition Plan:  Anticipate discharge back to previous environment Consults called:  none Admission status and Level of care: Progressive Cardiac:    for obs   Status is: Observation  The patient remains OBS appropriate and will d/c before 2 midnights.  Dispo: The patient is from:  rehab               Anticipated d/c is to: rehab              Patient currently is not medically stable to d/c.   Difficult to place patient No           Date of Service 10/19/2020    Lorretta Harp [facility]   If 7PM-7AM, please contact night-coverage [email or web address] 10/19/2020, 7:22 PM

## 2020-10-19 NOTE — ED Notes (Signed)
EDP requests repeat EKG with posterior lead placement. Repeat EKG obtained.

## 2020-10-19 NOTE — ED Notes (Signed)
Dr Niu at bedside 

## 2020-10-19 NOTE — ED Provider Notes (Signed)
[facility] Emergency Department Provider Note  ____________________________________________  Time seen: Approximately 1:04 PM  I have reviewed the triage vital signs and the nursing notes.   HISTORY  Chief Complaint Fall    Level 5 Caveat: Portions of the History and Physical including HPI and review of systems are unable to be completely obtained due to patient being a poor historian   HPI Wendy Nelson is a 55 y.o. female with a past history of stroke who was at [facility] doing physical therapy today when she became lightheaded and passed out.  Staff further assisting her, she did not fall, did not hit her head.  She was noted at that time to have systolic blood pressure decreased to 80s.  EMS report that when they arrived her blood pressure was normal and patient was alert, but still seems confused and low energy.   Patient denies any focal pain, denies headache or acute neck pain.  Overall feels that her neck pain is decreasing since her surgery.     Past Medical History:  Diagnosis Date  . Stroke [facility]) 09/2020     There are no problems to display for this patient.    Past Surgical History:  Procedure Laterality Date  . CERVICAL SPINE SURGERY       Prior to Admission medications   Not on File     Allergies Patient has no known allergies.   History reviewed. No pertinent family history.  Social History Social History   Tobacco Use  . Smoking status: Never Smoker  . Smokeless tobacco: Never Used    Review of Systems Level 5 Caveat: Portions of the History and Physical including HPI and review of systems are unable to be completely obtained due to patient being a poor historian   Constitutional:   No known fever.  ENT:   No rhinorrhea. Cardiovascular:   No chest pain or syncope. Respiratory:   No dyspnea or cough. Gastrointestinal:   Negative for abdominal pain, vomiting and diarrhea.  Musculoskeletal:   Negative for focal  pain or swelling ____________________________________________   PHYSICAL EXAM:  VITAL SIGNS: ED Triage Vitals  Enc Vitals Group     BP 10/19/20 1100 107/76     Pulse Rate 10/19/20 1100 66     Resp 10/19/20 1100 16     Temp 10/19/20 1100 98.1 F (36.7 C)     Temp Source 10/19/20 1100 Oral     SpO2 10/19/20 1100 97 %     Weight 10/19/20 1055 275 lb (124.7 kg)     Height 10/19/20 1055 5\' 5"  (1.651 m)     Head Circumference --      [facility] --      Pain Score 10/19/20 1053 0     Pain Loc --      Pain Edu? --      Excl. in GC? --     Vital signs reviewed, nursing assessments reviewed.   Constitutional:   Alert and oriented. Non-toxic appearance. Eyes:   Conjunctivae are normal. EOMI. PERRL.  No APD ENT      Head:   Normocephalic and atraumatic.      Nose:   No congestion/rhinnorhea.       Mouth/Throat:   Dry mucous membranes, no pharyngeal erythema. No peritonsillar mass.       Neck:   No meningismus. Full ROM. Hematological/Lymphatic/Immunilogical:   No cervical lymphadenopathy. Cardiovascular:   RRR. Symmetric bilateral radial and DP pulses.  No murmurs. Cap  refill less than 2 seconds. Respiratory:   Normal respiratory effort without tachypnea/retractions. Breath sounds are clear and equal bilaterally. No wheezes/rales/rhonchi. Gastrointestinal:   Soft and nontender. Non distended. There is no CVA tenderness.  No rebound, rigidity, or guarding. Genitourinary:   deferred Musculoskeletal:   Normal range of motion in all extremities. No joint effusions.  No lower extremity tenderness.  No edema. Neurologic:   Normal speech and language.  Motor grossly intact. No acute focal neurologic deficits are appreciated.  Skin:    Skin is warm, dry and intact. No rash noted.  No petechiae, purpura, or bullae.  ____________________________________________    LABS (pertinent positives/negatives) (all labs ordered are listed, but only abnormal results are displayed) Labs Reviewed   COMPREHENSIVE METABOLIC PANEL - Abnormal; Notable for the following components:      Result Value   Sodium 132 (*)    Chloride 96 (*)    BUN 102 (*)    Creatinine, Ser 4.58 (*)    Calcium 8.6 (*)    Albumin 2.8 (*)    GFR, Estimated 11 (*)    All other components within normal limits  CBC WITH DIFFERENTIAL/PLATELET - Abnormal; Notable for the following components:   RBC 3.61 (*)    Hemoglobin 10.5 (*)    HCT 30.5 (*)    Eosinophils Absolute 0.7 (*)    All other components within normal limits  RESP PANEL BY RT-PCR (FLU A&B, COVID) [id number]  URINALYSIS, COMPLETE (UACMP) WITH MICROSCOPIC  TROPONIN I (HIGH SENSITIVITY)  TROPONIN I (HIGH SENSITIVITY)   ____________________________________________   EKG  Interpreted by me Sinus rhythm rate of 67, normal axis and intervals.  Normal QRS.  Slight ST depression with T wave inversion in V2 through V4.  Repeat EKG obtained with posterior leads V4 through V6, negative for posterior STEMI.  ____________________________________________    RADIOLOGY  No results found.  ____________________________________________   PROCEDURES Procedures  ____________________________________________  DIFFERENTIAL DIAGNOSIS   Dehydration, AKI, electrolyte abnormality, non-STEMI, cystitis, Covid/flu  CLINICAL IMPRESSION / ASSESSMENT AND PLAN / ED COURSE  Medications ordered in the ED: Medications  lactated ringers bolus 1,000 mL (has no administration in time range)  sodium chloride 0.9 % bolus 500 mL (500 mLs Intravenous New Bag/Given 10/19/20 1119)    Pertinent labs & imaging results that were available during my care of the patient were reviewed by me and considered in my medical decision making (see chart for details).   Wendy Nelson was evaluated in Emergency Department on 10/19/2020 for the symptoms described in the history of present illness. She was evaluated in the context of the global COVID-19 pandemic, which necessitated  consideration that the patient might be at risk for infection with the SARS-CoV-2 virus that causes COVID-19. Institutional protocols and algorithms that pertain to the evaluation of patients at risk for COVID-19 are in a state of rapid change based on information released by regulatory bodies including the CDC and federal and state organizations. These policies and algorithms were followed during the patient's care in the ED.   Patient brought to ED due to syncope while upright doing physical therapy at [facility].  EKGs negative for STEMI.  No chest pain or shortness of breath.  Initial troponin normal.  Vital signs normal.  Lab panel shows creatinine of 4.5, acutely elevated compared to creatinine of 1.02 weeks ago in care everywhere.  Patient is also uremic with BUN of 102.  This may be causing encephalopathy.  No evidence of uremic pericarditis.  Electrolytes are normal.  Suspect dehydration.  We will plan to admit for further management.      ____________________________________________   FINAL CLINICAL IMPRESSION(S) / ED DIAGNOSES    Final diagnoses:  AKI (acute kidney injury) (HCC)  Syncope, unspecified syncope type     ED Discharge Orders    None      Portions of this note were generated with dragon dictation software. Dictation errors may occur despite best attempts at proofreading.   Sharman Cheek, MD 10/19/20 1312

## 2020-10-19 NOTE — ED Notes (Signed)
Informed RN bed assigned 1324 

## 2020-10-19 NOTE — Consult Note (Signed)
Urology Consult  I have been asked to see the patient by Dr. Clyde Lundborg, for evaluation and management of acute renal failure associated with bilateral hydronephrosis.  Chief Complaint: Syncope, hypotension  History of Present Illness: Wendy Nelson is a 55 y.o. 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  Notably, she was treated for febrile UTI during her hospitalization last month.  Urine culture grew MDR Enterobacter cloacae.  Notably, per chart review she was recommended to initiate in and out catheterization every 6 hours upon discharge given her new spinal cord injury.  Admission labs notable for creatinine 4.58, baseline 1.0.  UA pending.  WBC count 7.5.  She is afebrile, VSS.  She underwent renal ultrasound on admission which revealed moderate bilateral hydronephrosis without shadowing stones.  Both ureteral jets were noted.  Additionally, bladder was noted to be distended with estimated volume 1220 mL.  Subsequent CT stone study revealed a largely distended urinary bladder with bilateral hydronephrosis and no urolithiasis.  No prior cross-sectional imaging available to view, however on chart review she underwent CTAP with contrast on 09/29/2020; she was not noted to have hydronephrosis and her bladder was decompressed due to Foley at that time.  Today she reports loss of bladder sensation following her recent spinal cord injury.  She does not believe she has been having any urinary leakage.  She states she has not been intermittently catheterized at her SNF as recommended.  Past Medical History:  Diagnosis Date  . Stroke [facility]) 09/2020    Past Surgical  History:  Procedure Laterality Date  . CERVICAL SPINE SURGERY      Home Medications:  Current Meds  Medication Sig  . Acetaminophen 500 MG capsule Take by mouth.    Allergies:  Allergies  Allergen Reactions  . Melatonin   . Sulfa Antibiotics     Family History  Problem Relation Age of Onset  . Diabetes Mellitus II Mother   . Hypertension Mother   . Diabetes Mellitus II Brother     Social History:  reports that she has never smoked. She has never used smokeless tobacco. No history on file for alcohol use and drug use.  ROS: A complete review of systems was performed.  All systems are negative except for pertinent findings as noted.  Physical Exam:  Vital signs in last 24 hours: Temp:  [98.1 F (36.7 C)-98.5 F (36.9 C)] 98.5 F (36.9 C) (04/06 1530) Pulse Rate:  [58-66] 64 (04/06 1530) Resp:  [15-18] 18 (04/06 1530) BP: (107-131)/(74-90) 113/74 (04/06 1530) SpO2:  [97 %-100 %] 100 % (04/06 1530) Weight:  [124.7 kg] 124.7 kg (04/06 1055) Constitutional:  Alert and oriented, no acute distress HEENT: [place] AT, moist mucus membranes Cardiovascular: No clubbing, cyanosis, or edema Respiratory: Normal respiratory effort Skin: No rashes, bruises or suspicious lesions Neurologic: Lying in bed, requires assistance with movements, wearing c-collar Psychiatric: Normal mood and affect  Laboratory Data:  Recent Labs    10/19/20 1113  WBC 7.5  HGB 10.5*  HCT 30.5*   Recent Labs    10/19/20 1113  NA 132*  K 4.1  CL 96*  CO2 22  GLUCOSE 96  BUN 102*  CREATININE 4.58*  CALCIUM 8.6*  Urinalysis Results for orders placed or performed during the hospital encounter of 10/19/20  Resp Panel by RT-PCR (Flu A&B, Covid) Nasopharyngeal Swab     Status: None   Collection Time: 10/19/20  1:45 PM   Specimen: Nasopharyngeal Swab; Nasopharyngeal(NP) swabs in vial transport medium  Result Value Ref Range Status   SARS Coronavirus 2 by RT PCR NEGATIVE NEGATIVE Final    Comment:  (NOTE) SARS-CoV-2 target nucleic acids are NOT DETECTED.  The SARS-CoV-2 RNA is generally detectable in upper respiratory specimens during the acute phase of infection. The lowest concentration of SARS-CoV-2 viral copies this assay can detect is 138 copies/mL. A negative result does not preclude SARS-Cov-2 infection and should not be used as the sole basis for treatment or other patient management decisions. A negative result may occur with  improper specimen collection/handling, submission of specimen other than nasopharyngeal swab, presence of viral mutation(s) within the areas targeted by this assay, and inadequate number of viral copies(<138 copies/mL). A negative result must be combined with clinical observations, patient history, and epidemiological information. The expected result is Negative.  Fact Sheet for Patients:  BloggerCourse.com  Fact Sheet for Healthcare Providers:  SeriousBroker.it  This test is no t yet approved or cleared by the [place] FDA and  has been authorized for detection and/or diagnosis of SARS-CoV-2 by FDA under an Emergency Use Authorization (EUA). This EUA will remain  in effect (meaning this test can be used) for the duration of the COVID-19 declaration under Section 564(b)(1) of the Act, 21 U.S.C.section 360bbb-3(b)(1), unless the authorization is terminated  or revoked sooner.       Influenza A by PCR NEGATIVE NEGATIVE Final   Influenza B by PCR NEGATIVE NEGATIVE Final    Comment: (NOTE) The Xpert Xpress SARS-CoV-2/FLU/RSV plus assay is intended as an aid in the diagnosis of influenza from Nasopharyngeal swab specimens and should not be used as a sole basis for treatment. Nasal washings and aspirates are unacceptable for Xpert Xpress SARS-CoV-2/FLU/RSV testing.  Fact Sheet for Patients: BloggerCourse.com  Fact Sheet for Healthcare  Providers: SeriousBroker.it  This test is not yet approved or cleared by the [place] FDA and has been authorized for detection and/or diagnosis of SARS-CoV-2 by FDA under an Emergency Use Authorization (EUA). This EUA will remain in effect (meaning this test can be used) for the duration of the COVID-19 declaration under Section 564(b)(1) of the Act, 21 U.S.C. section 360bbb-3(b)(1), unless the authorization is terminated or revoked.  Performed at [facility], [address], [place], [place] [address]     Radiologic Imaging: US RENAL  Result Date: 10/19/2020 CLINICAL DATA:  Acute kidney injury EXAM: RENAL / URINARY TRACT ULTRASOUND COMPLETE COMPARISON:  None. FINDINGS: Right Kidney: Renal measurements: 11.1 x 5.3 x 5.2 cm = volume: 157 mL. Echogenicity within normal limits. Moderate hydronephrosis is seen. No renal or collecting system calculi. Left Kidney: Renal measurements: 1.9 x 6.4 x 5.8 cm = volume: 233 mL. Echogenicity within normal limits. Moderate hydronephrosis is noted. Bladder: Appears normal for degree of bladder distention. Bilateral ureteral jets are seen. Other: None. IMPRESSION: Moderate bilateral hydronephrosis without shadowing stones. Ureteral jets are noted. Electronically Signed   By: Jonna Clark M.D.   On: 10/19/2020 15:33   CT RENAL STONE STUDY  Result Date: 10/19/2020 CLINICAL DATA:  55 year old female with hydronephrosis. Concern for kidney stone. EXAM: CT ABDOMEN AND PELVIS WITHOUT CONTRAST TECHNIQUE: Multidetector CT imaging of the abdomen and pelvis was performed following the standard protocol  without IV contrast. COMPARISON:  Renal ultrasound dated 10/19/2020. FINDINGS: Evaluation of this exam is limited in the absence of intravenous contrast. Lower chest: Left lung base linear atelectasis/scarring. A 2 cm focal subpleural nodularity at the left lung base, likely scarring. Attention on follow-up imaging recommended. No  intra-abdominal free air or free fluid. Hepatobiliary: No focal liver abnormality is seen. No gallstones, gallbladder wall thickening, or biliary dilatation. Pancreas: Unremarkable. No pancreatic ductal dilatation or surrounding inflammatory changes. Spleen: Normal in size without focal abnormality. Adrenals/Urinary Tract: The adrenal glands unremarkable. Mild to moderate bilateral hydronephrosis. No stone identified. There is mild bilateral hydroureter. There is moderate distension of the bladder with slightly irregular and trabeculated bladder wall which may be related to chronic bladder dysfunction. Clinical correlation is recommended. Stomach/Bowel: No bowel obstruction or active inflammation. The appendix is normal. Vascular/Lymphatic: Mild aortoiliac atherosclerotic disease. The IVC is unremarkable. No portal venous gas. There is no adenopathy. Reproductive: The uterus is grossly unremarkable. No adnexal masses. Other: None Musculoskeletal: Bilateral sacroiliitis. No acute osseous pathology. IMPRESSION: 1. Moderately distended urinary bladder with mild to moderate bilateral hydronephrosis possibly related to chronic bladder dysfunction. Clinical correlation is recommended. No kidney stones. 2. No bowel obstruction. Normal appendix. 3. Aortic Atherosclerosis (ICD10-I70.0). Electronically Signed   By: Elgie Collard M.D.   On: 10/19/2020 16:43   Assessment & Plan:  55 year old female s/p cervical spinal cord infarct following C5-7 posterior cervical laminectomy and fusion with right-sided foraminotomies who presents today following a syncopal episode at SNF due to hypotension.  She was subsequently found to have acute renal failure and urinary retention with a distended urinary bladder and bilateral hydronephrosis.  Acute renal failure likely postrenal in etiology secondary to neurogenic bladder given her recent spinal cord infarct.  I explained to the patient and her daughter that I am not optimistic  regarding her ability to regain the ability to spontaneously void in the short-term given the recency of her spinal cord injury.  Management options in this case would include chronic indwelling Foley catheter versus intermittent self-catheterization, however if intermittent catheterization cannot be performed reliably at SNF, indwelling Foley would be preferred.  Recommendations: -Place Foley catheter -Obtain UA and culture -Monitor I/O's as patient is at risk for postobstructive diuresis following Foley placement -Daily BMP -Outpatient follow-up in 2-3 weeks to discuss management of neurogenic bladder  Thank you for involving me in this patient's care, please page with any further questions or concerns.  Carman Ching, PA-C 10/19/2020 4:02 PM

## 2020-10-20 DIAGNOSIS — M4712 Other spondylosis with myelopathy, cervical region: Secondary | ICD-10-CM | POA: Diagnosis present

## 2020-10-20 DIAGNOSIS — N178 Other acute kidney failure: Secondary | ICD-10-CM | POA: Diagnosis present

## 2020-10-20 DIAGNOSIS — Z981 Arthrodesis status: Secondary | ICD-10-CM | POA: Diagnosis not present

## 2020-10-20 DIAGNOSIS — Z8249 Family history of ischemic heart disease and other diseases of the circulatory system: Secondary | ICD-10-CM | POA: Diagnosis not present

## 2020-10-20 DIAGNOSIS — N1330 Unspecified hydronephrosis: Secondary | ICD-10-CM

## 2020-10-20 DIAGNOSIS — N179 Acute kidney failure, unspecified: Secondary | ICD-10-CM | POA: Diagnosis not present

## 2020-10-20 DIAGNOSIS — I129 Hypertensive chronic kidney disease with stage 1 through stage 4 chronic kidney disease, or unspecified chronic kidney disease: Secondary | ICD-10-CM | POA: Diagnosis present

## 2020-10-20 DIAGNOSIS — E785 Hyperlipidemia, unspecified: Secondary | ICD-10-CM | POA: Diagnosis present

## 2020-10-20 DIAGNOSIS — I2510 Atherosclerotic heart disease of native coronary artery without angina pectoris: Secondary | ICD-10-CM | POA: Diagnosis present

## 2020-10-20 DIAGNOSIS — Z888 Allergy status to other drugs, medicaments and biological substances status: Secondary | ICD-10-CM | POA: Diagnosis not present

## 2020-10-20 DIAGNOSIS — Z8673 Personal history of transient ischemic attack (TIA), and cerebral infarction without residual deficits: Secondary | ICD-10-CM | POA: Diagnosis not present

## 2020-10-20 DIAGNOSIS — R55 Syncope and collapse: Secondary | ICD-10-CM | POA: Diagnosis present

## 2020-10-20 DIAGNOSIS — I959 Hypotension, unspecified: Secondary | ICD-10-CM | POA: Diagnosis present

## 2020-10-20 DIAGNOSIS — Z882 Allergy status to sulfonamides status: Secondary | ICD-10-CM | POA: Diagnosis not present

## 2020-10-20 DIAGNOSIS — N182 Chronic kidney disease, stage 2 (mild): Secondary | ICD-10-CM | POA: Diagnosis present

## 2020-10-20 DIAGNOSIS — G9511 Acute infarction of spinal cord (embolic) (nonembolic): Secondary | ICD-10-CM | POA: Diagnosis present

## 2020-10-20 DIAGNOSIS — F32A Depression, unspecified: Secondary | ICD-10-CM | POA: Diagnosis present

## 2020-10-20 DIAGNOSIS — F419 Anxiety disorder, unspecified: Secondary | ICD-10-CM | POA: Diagnosis present

## 2020-10-20 DIAGNOSIS — I700 Atherosclerosis of aorta: Secondary | ICD-10-CM | POA: Diagnosis present

## 2020-10-20 DIAGNOSIS — M4722 Other spondylosis with radiculopathy, cervical region: Secondary | ICD-10-CM | POA: Diagnosis present

## 2020-10-20 DIAGNOSIS — N319 Neuromuscular dysfunction of bladder, unspecified: Secondary | ICD-10-CM | POA: Diagnosis present

## 2020-10-20 DIAGNOSIS — Z87440 Personal history of urinary (tract) infections: Secondary | ICD-10-CM | POA: Diagnosis not present

## 2020-10-20 DIAGNOSIS — Z20822 Contact with and (suspected) exposure to covid-19: Secondary | ICD-10-CM | POA: Diagnosis present

## 2020-10-20 DIAGNOSIS — F444 Conversion disorder with motor symptom or deficit: Secondary | ICD-10-CM | POA: Diagnosis present

## 2020-10-20 LAB — BASIC METABOLIC PANEL
Anion gap: 12 (ref 5–15)
BUN: 84 mg/dL — ABNORMAL HIGH (ref 6–20)
CO2: 24 mmol/L (ref 22–32)
Calcium: 8.3 mg/dL — ABNORMAL LOW (ref 8.9–10.3)
Chloride: 104 mmol/L (ref 98–111)
Creatinine, Ser: 2.57 mg/dL — ABNORMAL HIGH (ref 0.44–1.00)
GFR, Estimated: 22 mL/min — ABNORMAL LOW (ref >60)
Glucose, Bld: 82 mg/dL (ref 70–99)
Potassium: 3.9 mmol/L (ref 3.5–5.1)
Sodium: 140 mmol/L (ref 135–145)

## 2020-10-20 LAB — CBC
HCT: 26.4 % — ABNORMAL LOW (ref 36.0–46.0)
Hemoglobin: 9.5 g/dL — ABNORMAL LOW (ref 12.0–15.0)
MCH: 30.7 pg (ref 26.0–34.0)
MCHC: 36 g/dL (ref 30.0–36.0)
MCV: 85.4 fL (ref 80.0–100.0)
Platelets: 242 10*3/uL (ref 150–400)
RBC: 3.09 MIL/uL — ABNORMAL LOW (ref 3.87–5.11)
RDW: 13.6 % (ref 11.5–15.5)
WBC: 6.5 10*3/uL (ref 4.0–10.5)
nRBC: 0 % (ref 0.0–0.2)

## 2020-10-20 MED ORDER — OXYCODONE-ACETAMINOPHEN 5-325 MG PO TABS
1.0000 | ORAL_TABLET | Freq: Three times a day (TID) | ORAL | Status: DC | PRN
  Administered 2020-10-20: 1 via ORAL
  Filled 2020-10-20: qty 1

## 2020-10-20 MED ORDER — DICYCLOMINE HCL 10 MG PO CAPS
10.0000 mg | ORAL_CAPSULE | Freq: Three times a day (TID) | ORAL | Status: DC
  Administered 2020-10-20 – 2020-10-21 (×6): 10 mg via ORAL
  Filled 2020-10-20 (×7): qty 1

## 2020-10-20 MED ORDER — CHLORHEXIDINE GLUCONATE CLOTH 2 % EX PADS
6.0000 | MEDICATED_PAD | Freq: Every day | CUTANEOUS | Status: DC
  Administered 2020-10-20: 6 via TOPICAL

## 2020-10-20 MED ORDER — FLUOROMETHOLONE 0.1 % OP SUSP
1.0000 [drp] | OPHTHALMIC | Status: DC | PRN
  Filled 2020-10-20: qty 5

## 2020-10-20 MED ORDER — TIZANIDINE HCL 2 MG PO TABS
2.0000 mg | ORAL_TABLET | Freq: Two times a day (BID) | ORAL | Status: DC
  Administered 2020-10-20 – 2020-10-21 (×3): 2 mg via ORAL
  Filled 2020-10-20 (×4): qty 1

## 2020-10-20 MED ORDER — BISACODYL 10 MG RE SUPP
10.0000 mg | Freq: Every day | RECTAL | Status: DC
  Administered 2020-10-20 – 2020-10-21 (×2): 10 mg via RECTAL
  Filled 2020-10-20 (×2): qty 1

## 2020-10-20 MED ORDER — ASPIRIN 325 MG PO TABS
325.0000 mg | ORAL_TABLET | Freq: Every day | ORAL | Status: DC
  Administered 2020-10-20 – 2020-10-21 (×2): 325 mg via ORAL
  Filled 2020-10-20 (×2): qty 1

## 2020-10-20 MED ORDER — SENNOSIDES-DOCUSATE SODIUM 8.6-50 MG PO TABS: 1.0000 | ORAL_TABLET | Freq: Every day | ORAL | Status: DC | PRN

## 2020-10-20 NOTE — Progress Notes (Addendum)
[facility]  - [place] at [facility]   PATIENT NAME: Wendy Nelson    MR#:  [id number]  DATE OF BIRTH:  [birth year]  SUBJECTIVE:   Patient came in after she had a spell of hypertension and dizziness with possible near syncope at the facility. She was recently discharged from [facility] mid March after she had second surgery for cervical spinal fusion and was found to have spinal cord infarction. [facility]  REVIEW OF SYSTEMS:   Review of Systems  Constitutional: Negative for chills, fever and weight loss.  HENT: Negative for ear discharge, ear pain and nosebleeds.   Eyes: Negative for blurred vision, pain and discharge.  Respiratory: Negative for sputum production, shortness of breath, wheezing and stridor.   Cardiovascular: Negative for chest pain, palpitations, orthopnea and PND.  Gastrointestinal: Negative for abdominal pain, diarrhea, nausea and vomiting.  Genitourinary: Negative for frequency and urgency.  Musculoskeletal: Negative for back pain and joint pain.  Neurological: Positive for weakness. Negative for sensory change, speech change and focal weakness.  Psychiatric/Behavioral: Negative for depression and hallucinations. The patient is not nervous/anxious.    Tolerating Diet: Tolerating PT:   DRUG ALLERGIES:   Allergies  Allergen Reactions  . Melatonin   . Sulfa Antibiotics     VITALS:  Blood pressure 122/69, pulse 68, temperature 98.6 F (37 C), temperature source Oral, resp. rate 17, height 5\' 5"  (1.651 m), weight 119.4 kg, SpO2 97 %.  PHYSICAL EXAMINATION:   Physical Exam  GENERAL:  55 y.o.-year-old patient lying in the bed with no acute distress.   LUNGS: Normal breath sounds bilaterally, no wheezing, rales, rhonchi. No use of accessory muscles of respiration.  CARDIOVASCULAR: S1, S2 normal. No murmurs, rubs, or gallops.  ABDOMEN: Soft, nontender, nondistended. Bowel sounds present. No organomegaly or mass.   Foley+ EXTREMITIES: No cyanosis, clubbing or edema b/l.    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  SKIN: No obvious rash, lesion, or ulcer.   LABORATORY PANEL:  CBC Recent Labs  Lab 10/20/20 0437  WBC 6.5  HGB 9.5*  HCT 26.4*  PLT 242    Chemistries  Recent Labs  Lab 10/19/20 1113 10/20/20 0437  NA 132* 140  K 4.1 3.9  CL 96* 104  CO2 22 24  GLUCOSE 96 82  BUN 102* 84*  CREATININE 4.58* 2.57*  CALCIUM 8.6* 8.3*  AST 20  --   ALT 21  --   ALKPHOS 82  --   BILITOT 0.9  --    Cardiac Enzymes No results for input(s): TROPONINI in the last 168 hours. RADIOLOGY:  CT HEAD WO CONTRAST  Result Date: 10/19/2020 CLINICAL DATA:  Syncope EXAM: CT HEAD WITHOUT CONTRAST TECHNIQUE: Contiguous axial images were obtained from the base of the skull through the vertex without intravenous contrast. COMPARISON:  Head CT July 21, 2020 FINDINGS: Brain: No evidence of acute large vascular territory infarction, hemorrhage, hydrocephalus, extra-axial collection or mass lesion/mass effect. Vascular: No hyperdense vessel or unexpected calcification. Skull: Normal. Negative for fracture or focal lesion. Sinuses/Orbits: The paranasal sinuses and mastoid air cells are predominantly clear. Orbits are grossly unremarkable. Other: None IMPRESSION: No acute intracranial findings. Electronically Signed   By: July 23, 2020 MD   On: 10/19/2020 14:30   12/19/2020 RENAL  Result Date: 10/19/2020 CLINICAL DATA:  Acute kidney injury EXAM: RENAL / URINARY TRACT ULTRASOUND COMPLETE COMPARISON:  None. FINDINGS: Right Kidney:  Renal measurements: 11.1 x 5.3 x 5.2 cm = volume: 157 mL. Echogenicity within normal limits. Moderate hydronephrosis is seen. No renal or collecting system calculi. Left Kidney: Renal measurements: 1.9 x 6.4 x 5.8 cm = volume: 233 mL. Echogenicity within normal limits. Moderate hydronephrosis is noted. Bladder: Appears normal for  degree of bladder distention. Bilateral ureteral jets are seen. Other: None. IMPRESSION: Moderate bilateral hydronephrosis without shadowing stones. Ureteral jets are noted. Electronically Signed   By: Jonna Clark M.D.   On: 10/19/2020 15:33   CT RENAL STONE STUDY  Result Date: 10/19/2020 CLINICAL DATA:  55 year old female with hydronephrosis. Concern for kidney stone. EXAM: CT ABDOMEN AND PELVIS WITHOUT CONTRAST TECHNIQUE: Multidetector CT imaging of the abdomen and pelvis was performed following the standard protocol without IV contrast. COMPARISON:  Renal ultrasound dated 10/19/2020. FINDINGS: Evaluation of this exam is limited in the absence of intravenous contrast. Lower chest: Left lung base linear atelectasis/scarring. A 2 cm focal subpleural nodularity at the left lung base, likely scarring. Attention on follow-up imaging recommended. No intra-abdominal free air or free fluid. Hepatobiliary: No focal liver abnormality is seen. No gallstones, gallbladder wall thickening, or biliary dilatation. Pancreas: Unremarkable. No pancreatic ductal dilatation or surrounding inflammatory changes. Spleen: Normal in size without focal abnormality. Adrenals/Urinary Tract: The adrenal glands unremarkable. Mild to moderate bilateral hydronephrosis. No stone identified. There is mild bilateral hydroureter. There is moderate distension of the bladder with slightly irregular and trabeculated bladder wall which may be related to chronic bladder dysfunction. Clinical correlation is recommended. Stomach/Bowel: No bowel obstruction or active inflammation. The appendix is normal. Vascular/Lymphatic: Mild aortoiliac atherosclerotic disease. The IVC is unremarkable. No portal venous gas. There is no adenopathy. Reproductive: The uterus is grossly unremarkable. No adnexal masses. Other: None Musculoskeletal: Bilateral sacroiliitis. No acute osseous pathology. IMPRESSION: 1. Moderately distended urinary bladder with mild to moderate  bilateral hydronephrosis possibly related to chronic bladder dysfunction. Clinical correlation is recommended. No kidney stones. 2. No bowel obstruction. Normal appendix. 3. Aortic Atherosclerosis (ICD10-I70.0). Electronically Signed   By: Elgie Collard M.D.   On: 10/19/2020 16:43   ASSESSMENT AND PLAN:  Wendy Nelson is a 55 y.o. 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 US-renal: Moderate bilateral hydronephrosis without shadowing stones. Ureteral jets are noted.  Syncope: Likely due to hypotension.  CT head is negative for acute intracranial abnormalities.  The hypotension possibly due to dehydration, but not sure if it is related to spinal cord infarction.  Blood pressure improved to 107/76 currently. -- Hemodynamically stable. Remains in sinus rhythm.  Acute renal failure superimposed on stage 2 chronic kidney disease due to bilateral hydronephrosis:  -- suspect neurogenic bladder Dr. Apolinar Junes of urology is consulted-- recommends Foley catheter for now and follow-up in the office for further evaluation -Foley catheter placed--6475 cc UOP so far -- creatinine 4.58-- 2.5  Hypotension: -midodrine 7.5 mg tid  Cervical spondylosis with myelopathy and radiculopathy and spinal cord infarction [facility]): -continue PT/OT when pt is back to rehab -continue lyric, tizannidine, prn percocet -ASA and lipitor for spinal cord infarction  HTN (hypertension) -Hold blood pressure medications due to hypotension  HLD (hyperlipidemia) -Lipitor  Depression and anxiety: Stable, no suicidal or homicidal ideations. -Continue home medications    DVT ppx: SQ Lovenox Code Status: Full code Family Communication:    Yes, patient's  daughter on the phone  Disposition Plan:  Anticipate  discharge back to previous environment Consults called:  none Level of care: Med-Surg Status is: Inpatient  Remains inpatient appropriate because:Inpatient level of care appropriate due to severity of illness   Dispo: The patient is from: SNF              Anticipated d/c is to: SNF              Patient currently is not medically stable to d/c.   Difficult to place patient No   If creat improves than pt will d/c to rehab in am     TOTAL TIME TAKING CARE OF THIS PATIENT: 30 minutes.  >50% time spent on counselling and coordination of care  Note: This dictation was prepared with Dragon dictation along with smaller phrase technology. Any transcriptional errors that result from this process are unintentional.  Enedina Finner M.D    [facility]   CC: Primary care physician; System, Provider Not InPatient ID: Suheyla Mortellaro, female   DOB: 1966-02-27, 55 y.o.   MRN: [id number]

## 2020-10-20 NOTE — TOC Progression Note (Signed)
Transition of Care [facility]) - Progression Note    Patient Details  Name: Wendy Nelson MRN: [id number] Date of Birth: 04/25/1966  Transition of Care [facility]) CM/SW Contact  Hetty Ely, RN Phone Number: 10/20/2020, 1:36 PM  Clinical Narrative:  Spoke with patient, who says she was in Rehab. At [facility] due to Spinal Surgery at [facility] and have no issues returning.         Expected Discharge Plan and Services                                                 Social Determinants of Health (SDOH) Interventions    Readmission Risk Interventions No flowsheet data found.

## 2020-10-21 DIAGNOSIS — N319 Neuromuscular dysfunction of bladder, unspecified: Secondary | ICD-10-CM

## 2020-10-21 LAB — BASIC METABOLIC PANEL
Anion gap: 10 (ref 5–15)
BUN: 38 mg/dL — ABNORMAL HIGH (ref 6–20)
CO2: 26 mmol/L (ref 22–32)
Calcium: 8.5 mg/dL — ABNORMAL LOW (ref 8.9–10.3)
Chloride: 109 mmol/L (ref 98–111)
Creatinine, Ser: 1.06 mg/dL — ABNORMAL HIGH (ref 0.44–1.00)
GFR, Estimated: >60 mL/min (ref >60)
Glucose, Bld: 85 mg/dL (ref 70–99)
Potassium: 4.2 mmol/L (ref 3.5–5.1)
Sodium: 145 mmol/L (ref 135–145)

## 2020-10-21 LAB — URINE CULTURE: Culture: 80000 — AB

## 2020-10-21 MED ORDER — LIDOCAINE 5 % EX PTCH
1.0000 | MEDICATED_PATCH | CUTANEOUS | 0 refills | Status: DC
Start: 2020-10-21 — End: 2022-12-05

## 2020-10-21 NOTE — [place] FL2 (Addendum)
[place] MEDICAID FL2 LEVEL OF CARE SCREENING TOOL     IDENTIFICATION  Patient Name: Wendy Nelson Birthdate: June 09, 1966 Sex: female Admission Date (Current Location): 10/19/2020  [place] Number:  Chiropodist and Address:  [facility], [address], [place], [place] [address]      Provider Number: [id number]  Attending Physician Name and Address:  Enedina Finner, MD  Relative Name and Phone Number:  Joylene Igo Day daughter [phone]    Current Level of Care: Hospital Recommended Level of Care: Skilled Nursing Facility Prior Approval Number:    Date Approved/Denied:   PASRR Number: PENDING  Discharge Plan: SNF    Current Diagnoses: Patient Active Problem List   Diagnosis Date Noted  . Neurogenic bladder   . Syncope 10/19/2020  . AKI (acute kidney injury) (HCC) 10/19/2020  . Bilateral hydronephrosis 10/19/2020  . HTN (hypertension) 10/19/2020  . HLD (hyperlipidemia) 10/19/2020  . Depression with anxiety 10/19/2020  . Hypotension 09/2020  . Cervical spondylosis with myelopathy and radiculopathy 09/2020  . Spinal cord infarction (HCC) 09/2020    Orientation RESPIRATION BLADDER Height & Weight     Self,Time,Situation  Normal External catheter Weight: 116.2 kg Height:  5\' 5"  (165.1 cm)  BEHAVIORAL SYMPTOMS/MOOD NEUROLOGICAL BOWEL NUTRITION STATUS      Incontinent Diet (need assistance with meals)  AMBULATORY STATUS COMMUNICATION OF NEEDS Skin   Extensive Assist Verbally Normal                       Personal Care Assistance Level of Assistance  Total care       Total Care Assistance: Maximum assistance   Functional Limitations Info  Sight,Hearing,Speech Sight Info: Adequate Hearing Info: Adequate Speech Info: Adequate    SPECIAL CARE FACTORS FREQUENCY                       Contractures Contractures Info: Not present    Additional Factors Info  Code Status,Allergies Code Status Info:  Full Allergies Info: Melatonin, Sulfa           Current Medications (10/21/2020):  This is the current hospital active medication list Current Facility-Administered Medications  Medication Dose Route Frequency Provider Last Rate Last Admin  . acetaminophen (TYLENOL) tablet 650 mg  650 mg Oral Q6H PRN 12/21/2020, MD   650 mg at 10/20/20 0049  . aspirin tablet 325 mg  325 mg Oral Daily 12/20/20, MD   325 mg at 10/21/20 12/21/20  . atorvastatin (LIPITOR) tablet 10 mg  10 mg Oral Daily 4709, MD   10 mg at 10/21/20 0840  . bisacodyl (DULCOLAX) suppository 10 mg  10 mg Rectal Daily 12/21/20, MD   10 mg at 10/21/20 0846  . Chlorhexidine Gluconate Cloth 2 % PADS 6 each  6 each Topical Daily 12/21/20, MD   6 each at 10/20/20 1242  . dicyclomine (BENTYL) capsule 10 mg  10 mg Oral TID WC 12/20/20, MD   10 mg at 10/21/20 1358  . DULoxetine (CYMBALTA) DR capsule 60 mg  60 mg Oral Daily 12/21/20, MD   60 mg at 10/21/20 0840  . enoxaparin (LOVENOX) injection 40 mg  40 mg Subcutaneous Q24H 12/21/20, MD   40 mg at 10/20/20 2159  . fluorometholone (FML) 0.1 % ophthalmic suspension 1 drop  1 drop Right Eye PRN 2160, MD      . lactated ringers infusion   Intravenous  Continuous Lorretta Harp, MD 75 mL/hr at 10/21/20 6333 Infusion Verify at 10/21/20 [phone]  . lidocaine (LIDODERM) 5 % 1 patch  1 patch Transdermal Q24H Manuela Schwartz, NP   1 patch at 10/20/20 2159  . midodrine (PROAMATINE) tablet 7.5 mg  7.5 mg Oral TID WC Lorretta Harp, MD   7.5 mg at 10/21/20 1357  . mirtazapine (REMERON) tablet 15 mg  15 mg Oral QHS Lorretta Harp, MD   15 mg at 10/20/20 2158  . ondansetron (ZOFRAN) injection 4 mg  4 mg Intravenous Q8H PRN Lorretta Harp, MD      . oxyCODONE-acetaminophen (PERCOCET/ROXICET) 5-325 MG per tablet 1 tablet  1 tablet Oral Q8H PRN Lorretta Harp, MD   1 tablet at 10/20/20 1004  . pantoprazole (PROTONIX) EC tablet 40 mg  40 mg Oral BID Lorretta Harp, MD   40 mg at 10/21/20 0840  . pregabalin (LYRICA)  capsule 100 mg  100 mg Oral QHS Lorretta Harp, MD   100 mg at 10/20/20 2158  . senna-docusate (Senokot-S) tablet 1 tablet  1 tablet Oral Daily PRN Lorretta Harp, MD      . tiZANidine (ZANAFLEX) tablet 2 mg  2 mg Oral BID Lorretta Harp, MD   2 mg at 10/21/20 0842  . traZODone (DESYREL) tablet 50 mg  50 mg Oral QHS Lorretta Harp, MD   50 mg at 10/20/20 2158     Discharge Medications: Please see discharge summary for a list of discharge medications.  Relevant Imaging Results:  Relevant Lab Results:   Additional Information SS# [id number]  Hetty Ely, RN

## 2020-10-21 NOTE — Discharge Summary (Signed)
[facility] -  at [facility]   PATIENT NAME: Wendy PlumpSherry Nelson    MR#:  [id number]  DATE OF BIRTH:  04/22/1966  DATE OF ADMISSION:  10/19/2020 ADMITTING PHYSICIAN: Enedina FinnerSona Nicolas Banh, MD  DATE OF DISCHARGE: 10/21/2020  PRIMARY CARE PHYSICIAN: System, Provider Not In    ADMISSION DIAGNOSIS:  Syncope [R55] AKI (acute kidney injury) (HCC) [N17.9] Syncope, unspecified syncope type [R55]  DISCHARGE DIAGNOSIS:  Acute Renal failure due to Neurogenic bladder now has Foley H/o Spinal cord infarct post Cervical surgery Hypotension--improved SECONDARY DIAGNOSIS:   Past Medical History:  Diagnosis Date  . 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 US-renal: Moderate bilateral hydronephrosis without shadowing stones. Ureteral jets are noted.  Syncope:Likely due to hypotension.CT head is negative for acute intracranial abnormalities. The hypotension possibly due to dehydration, but not sure if it is related to spinal cord infarction. Blood pressure improved to 107/76 currently. -- Hemodynamically stable. Remains in sinus rhythm.  Acute renal failure superimposed on stage 2 chronic kidney diseasedue to bilateral hydronephrosis: -- suspect neurogenic bladder Dr. Apolinar JunesBrandon of urology is consulted-- recommends Foley catheter for now and follow-up in the office for further evaluation -Foley catheter placed-- 8800 cc UOP so far -- creatinine 4.58-- 2.5--1.0  Hypotension: -midodrine7.5 mg tid --d/ced losartan and amlodipine for now  Cervical spondylosis with myelopathy and radiculopathyand spinal cord infarction [facility](HCC): -continue PT/OT when pt is back to rehab -continue lyric, tizannidine, prn  percocet -ASA and lipitor for spinal cord infarction  HTN (hypertension) -Hold blood pressure medications due to hypotension  HLD (hyperlipidemia) -Lipitor  Depression and anxiety:Stable, no suicidal or homicidal ideations. -Continue home medications   DVT ppx: SQ Lovenox Code Status:Full code Family Communication: Yes, patient's daughteron the phone 4/8 Disposition Plan: Anticipate discharge back to previous environment Consults called:none Level of care: Med-Surg Status is: Inpatient Dispo: The patient is from: SNF  Anticipated d/c is to: SNF  Patient currently is  medically stable to d/c.              Difficult to place patient No  Pt will f[facility]rology on April 26th.  CONSULTS OBTAINED:  Treatment Team:  Wendy Nelson, Ashley, MD  DRUG ALLERGIES:   Allergies  Allergen Reactions  . Melatonin   . Sulfa Antibiotics     DISCHARGE MEDICATIONS:   Allergies as of 10/21/2020      Reactions   Melatonin    Sulfa Antibiotics       Medication List    STOP taking these medications   amLODipine 5 MG tablet Commonly known as: NORVASC   losartan 100 MG tablet Commonly known as: COZAAR     TAKE these medications   acamprosate 333 MG tablet Commonly known as: CAMPRAL Take 333 mg by mouth 3 (three) times daily.   Acetaminophen 500 MG capsule Take by mouth.   aspirin 325 MG tablet Take 325 mg by mouth daily.   atorvastatin 10 MG tablet Commonly known as: LIPITOR Take 10 mg by mouth daily.   bisacodyl 10 MG suppository Commonly known as: DULCOLAX Place 1 suppository rectally daily. For 10 days   buprenorphine 2 MG Subl SL tablet Commonly known as: SUBUTEX Place 2 tablets under the tongue daily.   dicyclomine 10 MG capsule Commonly known as: BENTYL Take 10 mg by mouth 3 (three) times daily  with meals.   DULoxetine 30 MG capsule Commonly known as: CYMBALTA Take 60 mg by mouth daily.   fluorometholone 0.1 %  ophthalmic suspension Commonly known as: FML Place 1 drop into the right eye as needed.   lidocaine 5 % Commonly known as: LIDODERM Place 1 patch onto the skin daily. Remove & Discard patch within 12 hours or as directed by MD   meloxicam 7.5 MG tablet Commonly known as: MOBIC Take 7.5 mg by mouth daily.   midodrine 2.5 MG tablet Commonly known as: PROAMATINE Take 7.5 mg by mouth every 8 (eight) hours. For 30 days   mirtazapine 15 MG tablet Commonly known as: REMERON Take 15 mg by mouth at bedtime. For 30 days   omeprazole 20 MG capsule Commonly known as: PRILOSEC Take 40 mg by mouth 2 (two) times daily.   oxyCODONE-acetaminophen 5-325 MG tablet Commonly known as: PERCOCET/ROXICET Take 1 tablet by mouth every 8 (eight) hours as needed.   polyethylene glycol powder 17 GM/SCOOP powder Commonly known as: GLYCOLAX/MIRALAX Take by mouth.   pregabalin 100 MG capsule Commonly known as: LYRICA Take 1 capsule by mouth 2 (two) times daily. What changed: Another medication with the same name was removed. Continue taking this medication, and follow the directions you see here.   senna-docusate 8.6-50 MG tablet Commonly known as: Senokot-S Take 1 tablet by mouth daily as needed. For up to 14 days   tiZANidine 2 MG tablet Commonly known as: ZANAFLEX Take 2 mg by mouth 2 (two) times daily.   traZODone 50 MG tablet Commonly known as: DESYREL Take 50 mg by mouth at bedtime.       If you experience worsening of your admission symptoms, develop shortness of breath, life threatening emergency, suicidal or homicidal thoughts you must seek medical attention immediately by calling 911 or calling your MD immediately  if symptoms less severe.  You Must read complete instructions/literature along with all the possible adverse reactions/side effects for all the Medicines you take and that have been prescribed to you. Take any new Medicines after you have completely understood and accept  all the possible adverse reactions/side effects.   Please note  You were cared for by a hospitalist during your hospital stay. If you have any questions about your discharge medications or the care you received while you were in the hospital after you are discharged, you can call the unit and asked to speak with the hospitalist on call if the hospitalist that took care of you is not available. Once you are discharged, your primary care physician will handle any further medical issues. Please note that NO REFILLS for any discharge medications will be authorized once you are discharged, as it is imperative that you return to your primary care physician (or establish a relationship with a primary care physician if you do not have one) for your aftercare needs so that they can reassess your need for medications and monitor your lab values. Today   SUBJECTIVE   No new complaints  VITAL SIGNS:  Blood pressure (!) 146/87, pulse 66, temperature 99.1 F (37.3 C), temperature source Oral, resp. rate 18, height 5\' 5"  (1.651 m), weight 116.2 kg, SpO2 98 %.  I/O:    Intake/Output Summary (Last 24 hours) at 10/21/2020 1321 Last data filed at 10/21/2020 1027 Gross per 24 hour  Intake 2346.74 ml  Output 2825 ml  Net -478.26 ml    PHYSICAL EXAMINATION:  GENERAL:  55 y.o.-year-old patient lying in the bed with  no acute distress.  LUNGS: Normal breath sounds bilaterally, no wheezing, rales,rhonchi or crepitation. No use of accessory muscles of respiration.  CARDIOVASCULAR: S1, S2 normal. No murmurs, rubs, or gallops.  ABDOMEN: Soft, non-tender, non-distended. Bowel sounds present. No organomegaly or mass. Foley+ EXTREMITIES: No pedal edema, cyanosis, or clubbing.  NEUROLOGIC: Cranial nerves II through XII are intact. Right UE 3/5 Bilateral LE 2/5 weakness  PSYCHIATRIC: patient is alert and oriented x 3.  SKIN: No obvious rash, lesion, or ulcer.   DATA REVIEW:   CBC  Recent Labs  Lab 10/20/20 0437   WBC 6.5  HGB 9.5*  HCT 26.4*  PLT 242    Chemistries  Recent Labs  Lab 10/19/20 1113 10/20/20 0437 10/21/20 0747  NA 132*   < > 145  K 4.1   < > 4.2  CL 96*   < > 109  CO2 22   < > 26  GLUCOSE 96   < > 85  BUN 102*   < > 38*  CREATININE 4.58*   < > 1.06*  CALCIUM 8.6*   < > 8.5*  AST 20  --   --   ALT 21  --   --   ALKPHOS 82  --   --   BILITOT 0.9  --   --    < > = values in this interval not displayed.    Microbiology Results   Recent Results (from the past 240 hour(s))  Resp Panel by RT-PCR (Flu A&B, Covid) Nasopharyngeal Swab     Status: None   Collection Time: 10/19/20  1:45 PM   Specimen: Nasopharyngeal Swab; Nasopharyngeal(NP) swabs in vial transport medium  Result Value Ref Range Status   SARS Coronavirus 2 by RT PCR NEGATIVE NEGATIVE Final    Comment: (NOTE) SARS-CoV-2 target nucleic acids are NOT DETECTED.  The SARS-CoV-2 RNA is generally detectable in upper respiratory specimens during the acute phase of infection. The lowest concentration of SARS-CoV-2 viral copies this assay can detect is 138 copies/mL. A negative result does not preclude SARS-Cov-2 infection and should not be used as the sole basis for treatment or other patient management decisions. A negative result may occur with  improper specimen collection/handling, submission of specimen other than nasopharyngeal swab, presence of viral mutation(s) within the areas targeted by this assay, and inadequate number of viral copies(<138 copies/mL). A negative result must be combined with clinical observations, patient history, and epidemiological information. The expected result is Negative.  Fact Sheet for Patients:  BloggerCourse.com  Fact Sheet for Healthcare Providers:  SeriousBroker.it  This test is no t yet approved or cleared by the [place] FDA and  has been authorized for detection and/or diagnosis of SARS-CoV-2 by FDA under an  Emergency Use Authorization (EUA). This EUA will remain  in effect (meaning this test can be used) for the duration of the COVID-19 declaration under Section 564(b)(1) of the Act, 21 U.S.C.section 360bbb-3(b)(1), unless the authorization is terminated  or revoked sooner.       Influenza A by PCR NEGATIVE NEGATIVE Final   Influenza B by PCR NEGATIVE NEGATIVE Final    Comment: (NOTE) The Xpert Xpress SARS-CoV-2/FLU/RSV plus assay is intended as an aid in the diagnosis of influenza from Nasopharyngeal swab specimens and should not be used as a sole basis for treatment. Nasal washings and aspirates are unacceptable for Xpert Xpress SARS-CoV-2/FLU/RSV testing.  Fact Sheet for Patients: BloggerCourse.com  Fact Sheet for Healthcare Providers: SeriousBroker.it  This test is not yet  approved or cleared by the [place] and has been authorized for detection and/or diagnosis of SARS-CoV-2 by FDA under an Emergency Use Authorization (EUA). This EUA will remain in effect (meaning this test can be used) for the duration of the COVID-19 declaration under Section 564(b)(1) of the Act, 21 U.S.C. section 360bbb-3(b)(1), unless the authorization is terminated or revoked.  Performed at [facility], [address] [place], [place] [address]   Urine Culture     Status: None (Preliminary result)   Collection Time: 10/19/20  4:45 PM   Specimen: Urine, Random  Result Value Ref Range Status   Specimen Description   Final    URINE, RANDOM Performed at [facility], [address] [place], [place] [address]    Special Requests   Final    NONE Performed at [facility], [address], [place], [place] [address]    Culture   Final    CULTURE REINCUBATED FOR BETTER GROWTH Performed at [facility], [address] [place], [place] [address]    Report Status PENDING  Incomplete  MRSA PCR Screening      Status: None   Collection Time: 10/19/20  4:56 PM   Specimen: Urine, Catheterized; Nasopharyngeal  Result Value Ref Range Status   MRSA by PCR NEGATIVE NEGATIVE Final    Comment:        The GeneXpert MRSA Assay (FDA approved for NASAL specimens only), is one component of a comprehensive MRSA colonization surveillance program. It is not intended to diagnose MRSA infection nor to guide or monitor treatment for MRSA infections. Performed at [facility], [address] [place], [place] [address]     RADIOLOGY:  CT HEAD WO CONTRAST  Result Date: 10/19/2020 CLINICAL DATA:  Syncope EXAM: CT HEAD WITHOUT CONTRAST TECHNIQUE: Contiguous axial images were obtained from the base of the skull through the vertex without intravenous contrast. COMPARISON:  Head CT July 21, 2020 FINDINGS: Brain: No evidence of acute large vascular territory infarction, hemorrhage, hydrocephalus, extra-axial collection or mass lesion/mass effect. Vascular: No hyperdense vessel or unexpected calcification. Skull: Normal. Negative for fracture or focal lesion. Sinuses/Orbits: The paranasal sinuses and mastoid air cells are predominantly clear. Orbits are grossly unremarkable. Other: None IMPRESSION: No acute intracranial findings. Electronically Signed   By: Maudry Mayhew MD   On: 10/19/2020 14:30   US RENAL  Result Date: 10/19/2020 CLINICAL DATA:  Acute kidney injury EXAM: RENAL / URINARY TRACT ULTRASOUND COMPLETE COMPARISON:  None. FINDINGS: Right Kidney: Renal measurements: 11.1 x 5.3 x 5.2 cm = volume: 157 mL. Echogenicity within normal limits. Moderate hydronephrosis is seen. No renal or collecting system calculi. Left Kidney: Renal measurements: 1.9 x 6.4 x 5.8 cm = volume: 233 mL. Echogenicity within normal limits. Moderate hydronephrosis is noted. Bladder: Appears normal for degree of bladder distention. Bilateral ureteral jets are seen. Other: None. IMPRESSION: Moderate bilateral hydronephrosis without  shadowing stones. Ureteral jets are noted. Electronically Signed   By: Jonna Clark M.D.   On: 10/19/2020 15:33   CT RENAL STONE STUDY  Result Date: 10/19/2020 CLINICAL DATA:  55 year old female with hydronephrosis. Concern for kidney stone. EXAM: CT ABDOMEN AND PELVIS WITHOUT CONTRAST TECHNIQUE: Multidetector CT imaging of the abdomen and pelvis was performed following the standard protocol without IV contrast. COMPARISON:  Renal ultrasound dated 10/19/2020. FINDINGS: Evaluation of this exam is limited in the absence of intravenous contrast. Lower chest: Left lung base linear atelectasis/scarring. A 2 cm focal subpleural nodularity  at the left lung base, likely scarring. Attention on follow-up imaging recommended. No intra-abdominal free air or free fluid. Hepatobiliary: No focal liver abnormality is seen. No gallstones, gallbladder wall thickening, or biliary dilatation. Pancreas: Unremarkable. No pancreatic ductal dilatation or surrounding inflammatory changes. Spleen: Normal in size without focal abnormality. Adrenals/Urinary Tract: The adrenal glands unremarkable. Mild to moderate bilateral hydronephrosis. No stone identified. There is mild bilateral hydroureter. There is moderate distension of the bladder with slightly irregular and trabeculated bladder wall which may be related to chronic bladder dysfunction. Clinical correlation is recommended. Stomach/Bowel: No bowel obstruction or active inflammation. The appendix is normal. Vascular/Lymphatic: Mild aortoiliac atherosclerotic disease. The IVC is unremarkable. No portal venous gas. There is no adenopathy. Reproductive: The uterus is grossly unremarkable. No adnexal masses. Other: None Musculoskeletal: Bilateral sacroiliitis. No acute osseous pathology. IMPRESSION: 1. Moderately distended urinary bladder with mild to moderate bilateral hydronephrosis possibly related to chronic bladder dysfunction. Clinical correlation is recommended. No kidney stones. 2.  No bowel obstruction. Normal appendix. 3. Aortic Atherosclerosis (ICD10-I70.0). Electronically Signed   By: Elgie Collard M.D.   On: 10/19/2020 16:43     CODE STATUS:     Code Status Orders  (From admission, onward)         Start     Ordered   10/19/20 1339  Full code  Continuous        10/19/20 1339        Code Status History    This patient has a current code status but no historical code status.   Advance Care Planning Activity       TOTAL TIME TAKING CARE OF THIS PATIENT: *40* minutes.    Enedina Finner M.D  [facility]    CC: Primary care physician; System, Provider Not In

## 2020-10-21 NOTE — TOC Transition Note (Signed)
Transition of Care [facility]) - CM/SW Discharge Note   Patient Details  Name: Wendy Nelson MRN: [id number] Date of Birth: 1965-11-19  [facility] CM/SW Contact:  Hetty Ely, RN Phone Number: 10/21/2020, 12:54 PM   Clinical Narrative:  Patient to be transported back to [facility], Room# 802. TOC barriers resolved.    Final next level of care: Skilled Nursing Facility Barriers to Discharge: Barriers Resolved   Patient Goals and CMS Choice Patient states their goals for this hospitalization and ongoing recovery are:: To return to SNF.   Choice offered to / list presented to : NA  Discharge Placement                Patient to be transferred to facility by: [facility] Name of family member notified: Nurse contacted family Patient and family notified of of transfer: 10/21/20  Discharge Plan and Services                DME Arranged: N/A DME Agency: NA       HH Arranged: NA HH Agency: NA        Social Determinants of Health (SDOH) Interventions     Readmission Risk Interventions No flowsheet data found.

## 2020-10-21 NOTE — Evaluation (Addendum)
Physical Therapy Evaluation Patient Details Name: Wendy Nelson MRN: [id number] DOB: [birth year] Today's Date: 10/21/2020   History of Present Illness  Pt is a 55 y/o F admitted from rehab on 10/19/20 with c/c of syncope. She was recently admitted to [facility] and had surgical intervention in the form of a C5-7 laminectomy and fusion with right-sided foraminotomies. After the surgery, pt developed weakness in all extremities, MRI of cervical spine on 09/25/2020 showed possible cervical spine spinal cord infarction. Pt passed out when doing PT. CT head is negative for acute intracranial abnormalities. Hypotension possibly due to dehydration but unsure if it's related to spinal cord infarction. 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 Pt without cervical collar donned, with pt reporting "I just took it off because it wasn't comfortable". MD (Dr. Allena Katz) in room to assess with recommendations to keep soft cervical collar donned at all times; PT educates pt on cervical precautions and importance of wearing collar, as well as log rolling for mobility. Pt requires +2 for long rolling & sidelying<>sitting with pt mainly able to assist with mobility with LUE as very little active movement noted in BLE & significant RUE weakness. Pt tolerates sitting EOB ~5 minutes with MAX assist & engages in washing face with therapists educating pt on sitting balance, core activation and upright posture. Therapists also educate pt & family on importance of repositioning in bed every 2 hours to prevent skin breakdown, benefits of PRAFO boots, R UE resting hand splint & stretching BLE & RUE. Pt's mother very grateful for all education. Pt would benefit from intensive therapies at CIR to maximize independence with  functional mobility & reduce caregiver burden prior to return home.      Follow Up Recommendations CIR    Equipment Recommendations  None recommended by PT    Recommendations for Other Services Rehab consult     Precautions / Restrictions Precautions Precautions: Fall;Cervical Required Braces or Orthoses: Cervical Brace Cervical Brace: Soft collar;At all times Restrictions Weight Bearing Restrictions: No      Mobility  Bed Mobility Overal bed mobility: Needs Assistance Bed Mobility: Rolling;Sidelying to Sit;Sit to Sidelying Rolling: Max assist;+2 for physical assistance Sidelying to sit: Max assist;+2 for physical assistance     Sit to sidelying: Max assist;+2 for safety/equipment General bed mobility comments: Cuing for use of bed rails, cuing for log rolling, moving BLE to EOB, uprighting trunk & reversing movements to return supine    Transfers                    Ambulation/Gait                Stairs            Wheelchair Mobility    Modified Rankin (Stroke Patients Only)       Balance Overall balance assessment: Needs assistance Sitting-balance support: Feet unsupported;Bilateral upper extremity supported Sitting balance-Leahy Scale: Zero Sitting balance - Comments: max assist Postural control: Posterior lean                                   Pertinent Vitals/Pain Pain Assessment: Faces Faces Pain Scale: No hurt    Home Living Family/patient expects to be discharged to::  Skilled nursing facility                      Prior Function           Comments: Pt reports independently walking prior to surgery in March, but currently requires +2 assist for bed mobility & sitting EOB at [facility].     Hand Dominance   Dominant Hand: Right    Extremity/Trunk Assessment   Upper Extremity Assessment Upper Extremity Assessment: RUE deficits/detail;LUE deficits/detail RUE Deficits / Details: 2/5 R shoulder  flexion, no active grip strength noted LUE Deficits / Details: 3+/5 L shoulder flexion    Lower Extremity Assessment Lower Extremity Assessment: RLE deficits/detail;LLE deficits/detail RLE Deficits / Details: RLE knee flexor tone, great difficulty flexing R knee to 45 degrees in supine, no active movement noted in RLE on command but some quad & dorsiflexion muscle contraction noted when PT assists pt with moving RLE in bed LLE Deficits / Details: no active movement noted in LLE on command but some quad & dorsiflexion muscle contraction noted when PT assists pt with moving LLE in bed Clonus noted in BLE    Cervical / Trunk Assessment Cervical / Trunk Assessment: Kyphotic (forward head, rounded shoulders)  Communication   Communication: No difficulties  Cognition Arousal/Alertness: Awake/alert Behavior During Therapy: WFL for tasks assessed/performed Overall Cognitive Status: Within Functional Limits for tasks assessed                                 General Comments: Pt unable to recall instructions for use of cervical collar, reports "I just took it off because it was uncomfortable"      General Comments General comments (skin integrity, edema, etc.): BP supine at beginning of session 109/67 mmHg in RUE (MAP 80), HR 73; pt does note feeling "lightheaded" after sitting EOB ~5 minutes but upon return supine reports symptoms immediately ceased    Exercises     Assessment/Plan    PT Assessment Patient needs continued PT services  PT Problem List Decreased strength;Decreased mobility;Impaired tone;Decreased safety awareness;Decreased range of motion;Decreased coordination;Obesity;Decreased knowledge of precautions;Decreased activity tolerance;Cardiopulmonary status limiting activity;Decreased balance;Decreased knowledge of use of DME       PT Treatment Interventions DME instruction;Therapeutic activities;Modalities;Gait training;Therapeutic exercise;Patient/family  education;Balance training;Stair training;Wheelchair mobility training;Functional mobility training;Neuromuscular re-education;Manual techniques    PT Goals (Current goals can be found in the Care Plan section)  Acute Rehab PT Goals Patient Stated Goal: get better PT Goal Formulation: With patient/family Time For Goal Achievement: 11/04/20 Potential to Achieve Goals: Fair    Frequency 7X/week   Barriers to discharge Decreased caregiver support;Inaccessible home environment      Co-evaluation               AM-PAC PT "6 Clicks" Mobility  Outcome Measure Help needed turning from your back to your side while in a flat bed without using bedrails?: Total Help needed moving from lying on your back to sitting on the side of a flat bed without using bedrails?: Total Help needed moving to and from a bed to a chair (including a wheelchair)?: Total Help needed standing up from a chair using your arms (e.g., wheelchair or bedside chair)?: Total Help needed to walk in hospital room?: Total Help needed climbing 3-5 steps with a railing? : Total 6 Click Score: 6    End of Session Equipment Utilized During Treatment: Cervical collar Activity Tolerance: Patient tolerated  treatment well Patient left: in bed;with call bell/phone within reach;with bed alarm set;with family/visitor present (cervical donned, positioned in R sidelying) Nurse Communication: Mobility status PT Visit Diagnosis: Muscle weakness (generalized) (M62.81);Hemiplegia and hemiparesis;Difficulty in walking, not elsewhere classified (R26.2) Hemiplegia - Right/Left: Right Hemiplegia - dominant/non-dominant: Dominant Hemiplegia - caused by: Other cerebrovascular disease    Time: 0539-7673 PT Time Calculation (min) (ACUTE ONLY): 34 min   Charges:   PT Evaluation $PT Eval Moderate Complexity: 1 Mod PT Treatments $Therapeutic Activity: 23-37 mins        Aleda Grana, PT, DPT 10/21/20, 2:15 PM   Sandi Mariscal 10/21/2020, 2:08 PM

## 2020-10-21 NOTE — Discharge Instructions (Signed)
Foley care per protocol  Pt to wear her soft neck collar as per instructions from [facility]

## 2020-11-08 ENCOUNTER — Encounter: Payer: Self-pay | Admitting: Physician Assistant

## 2020-11-08 ENCOUNTER — Ambulatory Visit: Payer: Medicare Other | Admitting: Physician Assistant

## 2020-11-17 ENCOUNTER — Ambulatory Visit: Payer: Medicare Other | Admitting: Physician Assistant

## 2020-11-22 ENCOUNTER — Ambulatory Visit: Payer: Medicare Other | Admitting: Physician Assistant

## 2020-11-29 ENCOUNTER — Ambulatory Visit (INDEPENDENT_AMBULATORY_CARE_PROVIDER_SITE_OTHER): Payer: Medicare Other | Admitting: Physician Assistant

## 2020-11-29 ENCOUNTER — Other Ambulatory Visit: Payer: Self-pay

## 2020-11-29 VITALS — BP 104/78 | HR 68

## 2020-11-29 DIAGNOSIS — N319 Neuromuscular dysfunction of bladder, unspecified: Secondary | ICD-10-CM | POA: Diagnosis not present

## 2020-11-29 NOTE — Progress Notes (Signed)
11/29/2020 4:36 PM   [name] 1966/06/19 [id number]  CC: Chief Complaint  Patient presents with  . Urinary Retention    HPI: Wendy Nelson is a 55 y.o. female with neurogenic bladder secondary to a spinal cord infarct sustained 2 months ago following C-spine surgery who presents today to discuss bladder management with Foley catheter in place.  She is accompanied today by her son, who contributes to HPI.  Patient continues to reside at [facility] and is undergoing rehab.  She reports she is getting stronger, however she remains nonambulatory and requiring max assist.  She states her Foley catheter drainage bag has been exchanged since she was seen in the hospital last month, however it is unclear if the Foley catheter itself has been exchanged.  PMH: Past Medical History:  Diagnosis Date  . Stroke [facility]) 09/2020    Surgical History: Past Surgical History:  Procedure Laterality Date  . CERVICAL SPINE SURGERY      Home Medications:  Allergies as of 11/29/2020      Reactions   Melatonin    Neomycin Other (See Comments)   Positive patch test   Sulfa Antibiotics       Medication List       Accurate as of Nov 29, 2020  4:36 PM. If you have any questions, ask your nurse or doctor.        acamprosate 333 MG tablet Commonly known as: CAMPRAL Take 333 mg by mouth 3 (three) times daily.   Acetaminophen 500 MG capsule Take by mouth.   aspirin 325 MG tablet Take 325 mg by mouth daily.   atorvastatin 10 MG tablet Commonly known as: LIPITOR Take 10 mg by mouth daily.   buprenorphine 2 MG Subl SL tablet Commonly known as: SUBUTEX Place 2 tablets under the tongue daily.   dicyclomine 10 MG capsule Commonly known as: BENTYL Take 10 mg by mouth 3 (three) times daily with meals.   DULoxetine 30 MG capsule Commonly known as: CYMBALTA Take 60 mg by mouth daily.   fluorometholone 0.1 % ophthalmic suspension Commonly known as: FML Place 1 drop into the right  eye as needed.   lidocaine 5 % Commonly known as: LIDODERM Place 1 patch onto the skin daily. Remove & Discard patch within 12 hours or as directed by MD   losartan 100 MG tablet Commonly known as: COZAAR Take 1 tablet by mouth daily.   meloxicam 7.5 MG tablet Commonly known as: MOBIC Take 7.5 mg by mouth daily.   omeprazole 20 MG capsule Commonly known as: PRILOSEC Take 40 mg by mouth 2 (two) times daily.   oxyCODONE-acetaminophen 5-325 MG tablet Commonly known as: PERCOCET/ROXICET Take 1 tablet by mouth every 8 (eight) hours as needed.   pregabalin 100 MG capsule Commonly known as: LYRICA Take 1 capsule by mouth 2 (two) times daily.   tiZANidine 2 MG tablet Commonly known as: ZANAFLEX Take 2 mg by mouth 2 (two) times daily.   traZODone 50 MG tablet Commonly known as: DESYREL Take 50 mg by mouth at bedtime.       Allergies:  Allergies  Allergen Reactions  . Melatonin   . Neomycin Other (See Comments)    Positive patch test  . Sulfa Antibiotics     Family History: Family History  Problem Relation Age of Onset  . Diabetes Mellitus II Mother   . Hypertension Mother   . Diabetes Mellitus II Brother     Social History:   reports that she  has never smoked. She has never used smokeless tobacco. No history on file for alcohol use and drug use.  Physical Exam: BP 104/78   Pulse 68   Constitutional:  Alert and oriented, no acute distress, nontoxic appearing HEENT: [place], AT Cardiovascular: No clubbing, cyanosis, or edema Respiratory: Normal respiratory effort, no increased work of breathing Skin: No rashes, bruises or suspicious lesions Neurologic: Global weakness, requires max assist and hydraulic lift, nonambulatory Psychiatric: Normal mood and affect  Assessment & Plan:   1. Neurogenic bladder We discussed bladder management options including chronic indwelling Foley, SPT, and CIC. Patient wishes to continue urethral Foley for now and I think this is  reasonable. Foley exchanged in clinic today with the aid of the hydraulic lift; see separate procedure note for more details. Patient wishes to pursue monthly Foley exchanges in our clinic and I am in agreement with this plan.  I recommended urodynamics testing for baseline evaluation of her bladder in the setting of recent spinal cord infarct and she agreed, referral placed today. - Ambulatory referral to Urology  Return in about 4 weeks (around 12/27/2020) for Catheter exchange.  Carman Ching, PA-C  [facility] [address], [address] [place], [place] [address] [phone]

## 2020-11-29 NOTE — Progress Notes (Signed)
Cath Change/ Replacement  Patient is present today for a catheter change due to urinary retention.  68ml of water was removed from the balloon, a 16FR foley cath was removed without difficulty.  Patient was cleaned and prepped in a sterile fashion with betadine. A 16 FR foley cath was replaced into the bladder no complications were noted Urine return was noted 49ml and urine was clear in color. The balloon was filled with 73ml of sterile water. A night bag was attached for drainage.      Performed by: Carman Ching, PA-C, Franchot Erichsen, CMA, and Ples Specter, CMA

## 2020-12-20 ENCOUNTER — Telehealth: Payer: Self-pay | Admitting: *Deleted

## 2020-12-20 ENCOUNTER — Telehealth: Payer: Self-pay | Admitting: Physician Assistant

## 2020-12-20 NOTE — Telephone Encounter (Signed)
Patient called to let us know that she has not produced any urine since yesterday. She does have a foley. Wendy Nelson

## 2020-12-20 NOTE — Telephone Encounter (Signed)
Patient called in today at 3.50pm  and states she has not had urine since last night. Offered her to come in today for a office visit. Patient dont have a ride. She states she needs a 24  Hour notices to get a ride. She states she has been drinking a lot of water. The medical tech got a urine sample from the bag per patient . Talked to the nurse and she will call us back with the output.

## 2020-12-20 NOTE — Telephone Encounter (Signed)
Nurse called back and states there has been output thought out the day . They did send her urine out for culture.

## 2020-12-23 ENCOUNTER — Emergency Department: Payer: Medicare Other

## 2020-12-23 ENCOUNTER — Inpatient Hospital Stay
Admission: EM | Admit: 2020-12-23 | Discharge: 2021-01-03 | DRG: 698 | Disposition: A | Discharge status: Skilled Nursing Facility | Payer: Medicare Other | Source: Skilled Nursing Facility | Attending: Internal Medicine | Admitting: Internal Medicine

## 2020-12-23 ENCOUNTER — Other Ambulatory Visit: Payer: Self-pay

## 2020-12-23 DIAGNOSIS — R509 Fever, unspecified: Secondary | ICD-10-CM | POA: Diagnosis not present

## 2020-12-23 DIAGNOSIS — Z833 Family history of diabetes mellitus: Secondary | ICD-10-CM

## 2020-12-23 DIAGNOSIS — T83511A Infection and inflammatory reaction due to indwelling urethral catheter, initial encounter: Secondary | ICD-10-CM | POA: Diagnosis not present

## 2020-12-23 DIAGNOSIS — N3090 Cystitis, unspecified without hematuria: Secondary | ICD-10-CM | POA: Diagnosis present

## 2020-12-23 DIAGNOSIS — M4712 Other spondylosis with myelopathy, cervical region: Secondary | ICD-10-CM

## 2020-12-23 DIAGNOSIS — M47812 Spondylosis without myelopathy or radiculopathy, cervical region: Secondary | ICD-10-CM | POA: Diagnosis present

## 2020-12-23 DIAGNOSIS — N319 Neuromuscular dysfunction of bladder, unspecified: Secondary | ICD-10-CM | POA: Diagnosis not present

## 2020-12-23 DIAGNOSIS — F32A Depression, unspecified: Secondary | ICD-10-CM | POA: Diagnosis present

## 2020-12-23 DIAGNOSIS — G9511 Acute infarction of spinal cord (embolic) (nonembolic): Secondary | ICD-10-CM | POA: Diagnosis not present

## 2020-12-23 DIAGNOSIS — R7989 Other specified abnormal findings of blood chemistry: Secondary | ICD-10-CM

## 2020-12-23 DIAGNOSIS — A419 Sepsis, unspecified organism: Secondary | ICD-10-CM

## 2020-12-23 DIAGNOSIS — I6930 Unspecified sequelae of cerebral infarction: Secondary | ICD-10-CM

## 2020-12-23 DIAGNOSIS — Z79899 Other long term (current) drug therapy: Secondary | ICD-10-CM

## 2020-12-23 DIAGNOSIS — Z791 Long term (current) use of non-steroidal anti-inflammatories (NSAID): Secondary | ICD-10-CM

## 2020-12-23 DIAGNOSIS — Y846 Urinary catheterization as the cause of abnormal reaction of the patient, or of later complication, without mention of misadventure at the time of the procedure: Secondary | ICD-10-CM | POA: Diagnosis present

## 2020-12-23 DIAGNOSIS — Z7982 Long term (current) use of aspirin: Secondary | ICD-10-CM

## 2020-12-23 DIAGNOSIS — F419 Anxiety disorder, unspecified: Secondary | ICD-10-CM | POA: Diagnosis present

## 2020-12-23 DIAGNOSIS — I69898 Other sequelae of other cerebrovascular disease: Secondary | ICD-10-CM

## 2020-12-23 DIAGNOSIS — Z20822 Contact with and (suspected) exposure to covid-19: Secondary | ICD-10-CM | POA: Diagnosis present

## 2020-12-23 DIAGNOSIS — Z888 Allergy status to other drugs, medicaments and biological substances status: Secondary | ICD-10-CM

## 2020-12-23 DIAGNOSIS — F418 Other specified anxiety disorders: Secondary | ICD-10-CM

## 2020-12-23 DIAGNOSIS — Z8249 Family history of ischemic heart disease and other diseases of the circulatory system: Secondary | ICD-10-CM

## 2020-12-23 DIAGNOSIS — N390 Urinary tract infection, site not specified: Principal | ICD-10-CM

## 2020-12-23 DIAGNOSIS — M4722 Other spondylosis with radiculopathy, cervical region: Secondary | ICD-10-CM

## 2020-12-23 DIAGNOSIS — G8250 Quadriplegia, unspecified: Secondary | ICD-10-CM | POA: Diagnosis present

## 2020-12-23 DIAGNOSIS — Z882 Allergy status to sulfonamides status: Secondary | ICD-10-CM

## 2020-12-23 LAB — URINALYSIS, COMPLETE (UACMP) WITH MICROSCOPIC
Bilirubin Urine: NEGATIVE
Glucose, UA: NEGATIVE mg/dL
Hgb urine dipstick: NEGATIVE
Ketones, ur: NEGATIVE mg/dL
Nitrite: NEGATIVE
Protein, ur: NEGATIVE mg/dL
Specific Gravity, Urine: 1.003 — ABNORMAL LOW (ref 1.005–1.030)
Squamous Epithelial / HPF: NONE SEEN (ref 0–5)
pH: 7 (ref 5.0–8.0)

## 2020-12-23 LAB — CBC WITH DIFFERENTIAL/PLATELET
Abs Immature Granulocytes: 0.05 10*3/uL (ref 0.00–0.07)
Basophils Absolute: 0.1 10*3/uL (ref 0.0–0.1)
Basophils Relative: 1 %
Eosinophils Absolute: 0.1 10*3/uL (ref 0.0–0.5)
Eosinophils Relative: 1 %
HCT: 32.7 % — ABNORMAL LOW (ref 36.0–46.0)
Hemoglobin: 10.9 g/dL — ABNORMAL LOW (ref 12.0–15.0)
Immature Granulocytes: 0 %
Lymphocytes Relative: 27 %
Lymphs Abs: 3.1 10*3/uL (ref 0.7–4.0)
MCH: 28.7 pg (ref 26.0–34.0)
MCHC: 33.3 g/dL (ref 30.0–36.0)
MCV: 86.1 fL (ref 80.0–100.0)
Monocytes Absolute: 1.2 10*3/uL — ABNORMAL HIGH (ref 0.1–1.0)
Monocytes Relative: 10 %
Neutro Abs: 7.2 10*3/uL (ref 1.7–7.7)
Neutrophils Relative %: 61 %
Platelets: 233 10*3/uL (ref 150–400)
RBC: 3.8 MIL/uL — ABNORMAL LOW (ref 3.87–5.11)
RDW: 15.1 % (ref 11.5–15.5)
WBC: 11.6 10*3/uL — ABNORMAL HIGH (ref 4.0–10.5)
nRBC: 0 % (ref 0.0–0.2)

## 2020-12-23 LAB — COMPREHENSIVE METABOLIC PANEL
ALT: 39 U/L (ref 0–44)
AST: 23 U/L (ref 15–41)
Albumin: 3 g/dL — ABNORMAL LOW (ref 3.5–5.0)
Alkaline Phosphatase: 134 U/L — ABNORMAL HIGH (ref 38–126)
Anion gap: 10 (ref 5–15)
BUN: 14 mg/dL (ref 6–20)
CO2: 25 mmol/L (ref 22–32)
Calcium: 8.9 mg/dL (ref 8.9–10.3)
Chloride: 100 mmol/L (ref 98–111)
Creatinine, Ser: 0.72 mg/dL (ref 0.44–1.00)
GFR, Estimated: >60 mL/min (ref >60)
Glucose, Bld: 101 mg/dL — ABNORMAL HIGH (ref 70–99)
Potassium: 4.1 mmol/L (ref 3.5–5.1)
Sodium: 135 mmol/L (ref 135–145)
Total Bilirubin: 0.9 mg/dL (ref 0.3–1.2)
Total Protein: 7 g/dL (ref 6.5–8.1)

## 2020-12-23 LAB — PROTIME-INR
INR: 1.2 (ref 0.8–1.2)
Prothrombin Time: 15.1 seconds (ref 11.4–15.2)

## 2020-12-23 LAB — LACTIC ACID, PLASMA: Lactic Acid, Venous: 0.8 mmol/L (ref 0.5–1.9)

## 2020-12-23 LAB — APTT: aPTT: 33 seconds (ref 24–36)

## 2020-12-23 MED ORDER — CHLORHEXIDINE GLUCONATE CLOTH 2 % EX PADS
6.0000 | MEDICATED_PAD | Freq: Every day | CUTANEOUS | Status: DC
  Administered 2020-12-24 – 2021-01-03 (×11): 6 via TOPICAL

## 2020-12-23 MED ORDER — ENOXAPARIN SODIUM 60 MG/0.6ML IJ SOSY
60.0000 mg | PREFILLED_SYRINGE | INTRAMUSCULAR | Status: DC
  Administered 2020-12-23 – 2021-01-02 (×11): 60 mg via SUBCUTANEOUS
  Filled 2020-12-23 (×11): qty 0.6

## 2020-12-23 MED ORDER — ONDANSETRON HCL 4 MG/2ML IJ SOLN
4.0000 mg | Freq: Four times a day (QID) | INTRAMUSCULAR | Status: DC | PRN
  Administered 2020-12-24 (×2): 4 mg via INTRAVENOUS
  Filled 2020-12-23 (×2): qty 2

## 2020-12-23 MED ORDER — ASCORBIC ACID 500 MG PO TABS
500.0000 mg | ORAL_TABLET | Freq: Two times a day (BID) | ORAL | Status: DC
  Administered 2020-12-23 – 2021-01-03 (×22): 500 mg via ORAL
  Filled 2020-12-23 (×23): qty 1

## 2020-12-23 MED ORDER — MIDODRINE HCL 5 MG PO TABS
10.0000 mg | ORAL_TABLET | Freq: Three times a day (TID) | ORAL | Status: DC
  Administered 2020-12-23: 10 mg via ORAL
  Filled 2020-12-23: qty 2

## 2020-12-23 MED ORDER — PANTOPRAZOLE SODIUM 40 MG PO TBEC
80.0000 mg | DELAYED_RELEASE_TABLET | Freq: Every day | ORAL | Status: DC
  Administered 2020-12-23 – 2021-01-03 (×12): 80 mg via ORAL
  Filled 2020-12-23 (×12): qty 2

## 2020-12-23 MED ORDER — LACTATED RINGERS IV SOLN: INTRAVENOUS | Status: DC

## 2020-12-23 MED ORDER — LACTATED RINGERS IV BOLUS
1000.0000 mL | Freq: Once | INTRAVENOUS | Status: AC
  Administered 2020-12-23: 1000 mL via INTRAVENOUS

## 2020-12-23 MED ORDER — TRAZODONE HCL 50 MG PO TABS
50.0000 mg | ORAL_TABLET | Freq: Every day | ORAL | Status: DC
  Administered 2020-12-23 – 2020-12-24 (×2): 50 mg via ORAL
  Filled 2020-12-23 (×2): qty 1

## 2020-12-23 MED ORDER — ACETAMINOPHEN 325 MG PO TABS
650.0000 mg | ORAL_TABLET | Freq: Four times a day (QID) | ORAL | Status: DC | PRN
  Administered 2020-12-25 – 2020-12-28 (×7): 650 mg via ORAL
  Filled 2020-12-23 (×7): qty 2

## 2020-12-23 MED ORDER — TIZANIDINE HCL 2 MG PO TABS
2.0000 mg | ORAL_TABLET | Freq: Two times a day (BID) | ORAL | Status: DC
  Administered 2020-12-23 – 2021-01-03 (×22): 2 mg via ORAL
  Filled 2020-12-23 (×23): qty 1

## 2020-12-23 MED ORDER — MELOXICAM 7.5 MG PO TABS
7.5000 mg | ORAL_TABLET | Freq: Every day | ORAL | Status: DC
  Administered 2020-12-23 – 2020-12-27 (×5): 7.5 mg via ORAL
  Filled 2020-12-23 (×7): qty 1

## 2020-12-23 MED ORDER — DICYCLOMINE HCL 10 MG PO CAPS
10.0000 mg | ORAL_CAPSULE | Freq: Three times a day (TID) | ORAL | Status: DC
  Administered 2020-12-23 – 2021-01-03 (×33): 10 mg via ORAL
  Filled 2020-12-23 (×35): qty 1

## 2020-12-23 MED ORDER — ONDANSETRON HCL 4 MG/2ML IJ SOLN
4.0000 mg | Freq: Once | INTRAMUSCULAR | Status: AC
  Administered 2020-12-23: 4 mg via INTRAVENOUS
  Filled 2020-12-23: qty 2

## 2020-12-23 MED ORDER — DULOXETINE HCL 60 MG PO CPEP
60.0000 mg | ORAL_CAPSULE | Freq: Every day | ORAL | Status: DC
  Administered 2020-12-23 – 2021-01-03 (×12): 60 mg via ORAL
  Filled 2020-12-23 (×2): qty 1
  Filled 2020-12-23 (×4): qty 2
  Filled 2020-12-23: qty 1
  Filled 2020-12-23: qty 2
  Filled 2020-12-23 (×5): qty 1
  Filled 2020-12-23: qty 2
  Filled 2020-12-23: qty 1
  Filled 2020-12-23: qty 2
  Filled 2020-12-23 (×3): qty 1

## 2020-12-23 MED ORDER — ACAMPROSATE CALCIUM 333 MG PO TBEC
333.0000 mg | DELAYED_RELEASE_TABLET | Freq: Three times a day (TID) | ORAL | Status: DC
  Administered 2020-12-23 – 2021-01-03 (×34): 333 mg via ORAL
  Filled 2020-12-23 (×35): qty 1

## 2020-12-23 MED ORDER — SODIUM CHLORIDE 0.9 % IV SOLN
INTRAVENOUS | Status: DC | PRN
  Administered 2020-12-24 – 2020-12-31 (×3): 500 mL via INTRAVENOUS

## 2020-12-23 MED ORDER — POLYETHYLENE GLYCOL 3350 17 G PO PACK
17.0000 g | PACK | Freq: Every day | ORAL | Status: DC
  Administered 2020-12-26 – 2020-12-27 (×2): 17 g via ORAL
  Filled 2020-12-23 (×4): qty 1

## 2020-12-23 MED ORDER — ATORVASTATIN CALCIUM 20 MG PO TABS
10.0000 mg | ORAL_TABLET | Freq: Every day | ORAL | Status: DC
  Administered 2020-12-23 – 2021-01-03 (×12): 10 mg via ORAL
  Filled 2020-12-23 (×12): qty 1

## 2020-12-23 MED ORDER — SODIUM CHLORIDE 0.9 % IV SOLN
2.0000 g | Freq: Once | INTRAVENOUS | Status: DC
  Filled 2020-12-23: qty 2

## 2020-12-23 MED ORDER — ADULT MULTIVITAMIN W/MINERALS CH
1.0000 | ORAL_TABLET | Freq: Every day | ORAL | Status: DC
  Administered 2020-12-23 – 2021-01-03 (×12): 1 via ORAL
  Filled 2020-12-23 (×12): qty 1

## 2020-12-23 MED ORDER — SODIUM CHLORIDE 0.9 % IV SOLN
1.0000 g | Freq: Once | INTRAVENOUS | Status: AC
  Administered 2020-12-23: 1 g via INTRAVENOUS
  Filled 2020-12-23: qty 10

## 2020-12-23 MED ORDER — SODIUM CHLORIDE 0.9 % IV SOLN
2.0000 g | Freq: Three times a day (TID) | INTRAVENOUS | Status: DC
  Administered 2020-12-23 – 2020-12-25 (×6): 2 g via INTRAVENOUS
  Filled 2020-12-23 (×8): qty 2

## 2020-12-23 MED ORDER — PREGABALIN 50 MG PO CAPS
100.0000 mg | ORAL_CAPSULE | Freq: Two times a day (BID) | ORAL | Status: DC
  Administered 2020-12-23 – 2021-01-03 (×22): 100 mg via ORAL
  Filled 2020-12-23 (×22): qty 2

## 2020-12-23 MED ORDER — SODIUM CHLORIDE 0.9 % IV SOLN
2.0000 g | Freq: Two times a day (BID) | INTRAVENOUS | Status: DC
  Filled 2020-12-23: qty 2

## 2020-12-23 MED ORDER — ACETAMINOPHEN 650 MG RE SUPP: 650.0000 mg | Freq: Four times a day (QID) | RECTAL | Status: DC | PRN

## 2020-12-23 MED ORDER — METHOCARBAMOL 1000 MG/10ML IJ SOLN
500.0000 mg | Freq: Four times a day (QID) | INTRAVENOUS | Status: DC | PRN
  Filled 2020-12-23: qty 5

## 2020-12-23 MED ORDER — OXYCODONE HCL 5 MG PO TABS
5.0000 mg | ORAL_TABLET | ORAL | Status: DC | PRN
  Administered 2020-12-24: 5 mg via ORAL
  Filled 2020-12-23: qty 1

## 2020-12-23 MED ORDER — ASPIRIN EC 325 MG PO TBEC
325.0000 mg | DELAYED_RELEASE_TABLET | Freq: Every day | ORAL | Status: DC
  Administered 2020-12-23 – 2021-01-03 (×12): 325 mg via ORAL
  Filled 2020-12-23 (×12): qty 1

## 2020-12-23 MED ORDER — ONDANSETRON HCL 4 MG PO TABS
4.0000 mg | ORAL_TABLET | Freq: Four times a day (QID) | ORAL | Status: DC | PRN
  Administered 2020-12-25: 09:00:00 4 mg via ORAL
  Filled 2020-12-23: qty 1

## 2020-12-23 NOTE — H&P (Signed)
[facility] History and Physical  Wendy Nelson CNO:[id number] DOB: 08/30/1965 DOA: 12/23/2020  Referring physician: Dr. Katrinka Blazing PCP: [facility]   Chief Complaint: Fever  HPI: Wendy Nelson is a 55 y.o. 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  Patient reports that for the last 3 days she has had a fever.  She reports that she lives at [facility] facility in [place].  Reports that they did many test to try and figure out why she was having fevers but did not find any source.  She currently denies any congestion, runny nose, cough, shortness of breath, chest pain, abdominal pain.  She denies any new bedsores that she is aware of.  She reports that due to a neurogenic bladder from her spinal cord injury she has had a chronic indwelling Foley for some time now.  Per review of documents from nursing facility patient had temperature as high as 103 Fahrenheit, and has been started on a course of Keflex for presumed urinary tract infection.  In the ED initial vital signs notable for temperature of 100.3 Fahrenheit, tachycardia to 107, and tachypnea to the low 20s, with remainder of vital signs normal.  Lab work-up notable for mild leukocytosis of 11.6 and anemia with hemoglobin of 10.9, remainder of laboratory work-up largely unremarkable including CMP, coags, EKG, and UA.  Chest x-ray was obtained which showed stable mild cardiomegaly but no other findings.  A CT abdomen pelvis was also obtained which showed suggestion of bladder wall thickening indicative of acute cystitis but no other acute findings.  She was presumed to have a urinary source of infection, and given 1 g ceftriaxone as well as a 1 L LR bolus.  Review of Systems:  Pertinent positives and negative per HPI,  all others reviewed and negative  Past Medical History:  Diagnosis Date   Stroke (HCC) 09/2020   Past Surgical History:  Procedure Laterality Date   CERVICAL SPINE SURGERY     Social History:  reports that she has never smoked. She has never used smokeless tobacco. No history on file for alcohol use and drug use.  Allergies  Allergen Reactions   Melatonin    Neomycin Other (See Comments)    Positive patch test   Sulfa Antibiotics     Family History  Problem Relation Age of Onset   Diabetes Mellitus II Mother    Hypertension Mother    Diabetes Mellitus II Brother      Prior to Admission medications   Medication Sig Start Date End Date Taking? Authorizing Provider  acamprosate (CAMPRAL) 333 MG tablet Take 333 mg by mouth 3 (three) times daily. 09/05/20   [provider]  Acetaminophen 500 MG capsule Take by mouth. 10/12/20   [provider]  aspirin 325 MG tablet Take 325 mg by mouth daily. 09/24/20 09/24/21  [provider]  atorvastatin (LIPITOR) 10 MG tablet Take 10 mg by mouth daily.    [provider]  buprenorphine (SUBUTEX) 2 MG SUBL SL tablet Place 2 tablets under the tongue daily. 09/12/20   [provider]  dicyclomine (BENTYL) 10 MG capsule Take 10 mg by mouth 3 (three) times daily with meals. 08/04/20 08/04/21  [provider]  DULoxetine (CYMBALTA) 30 MG capsule Take 60 mg by mouth daily. 10/12/20   [provider]  fluorometholone (FML)  0.1 % ophthalmic suspension Place 1 drop into the right eye as needed.    [provider]  lidocaine (LIDODERM) 5 % Place 1 patch onto the skin daily. Remove & Discard patch within 12 hours or as directed by MD 10/21/20   Enedina Finner, MD  losartan (COZAAR) 100 MG tablet Take 1 tablet by mouth daily. 11/28/20   [provider]  meloxicam (MOBIC) 7.5 MG tablet Take 7.5 mg by mouth daily. 09/04/20   [provider]  omeprazole (PRILOSEC) 20 MG capsule Take 40  mg by mouth 2 (two) times daily. 08/04/20   [provider]  oxyCODONE-acetaminophen (PERCOCET/ROXICET) 5-325 MG tablet Take 1 tablet by mouth every 8 (eight) hours as needed. 08/18/20   [provider]  pregabalin (LYRICA) 100 MG capsule Take 1 capsule by mouth 2 (two) times daily. 10/12/20   [provider]  tiZANidine (ZANAFLEX) 2 MG tablet Take 2 mg by mouth 2 (two) times daily. 09/14/20   [provider]  traZODone (DESYREL) 50 MG tablet Take 50 mg by mouth at bedtime. 07/25/20   [provider]   Physical Exam: Vitals:   12/23/20 1045 12/23/20 1100 12/23/20 1200 12/23/20 1230  BP:   124/88 119/80  Pulse: 95 98 90 88  Resp: (!) 23 (!) 21 15 18   Temp:      TempSrc:      SpO2: 94% 96% 94% 96%  Height:        Wt Readings from Last 3 Encounters:  10/21/20 116.2 kg     General:  Appears calm and comfortable. Speaks very softly. Eyes: PERRL, normal lids, irises & conjunctiva ENT: grossly normal hearing, lips & tongue Neck: no masses Cardiovascular: RRR, no m/r/g. No LE edema. Telemetry: SR, no arrhythmias  Respiratory: CTA bilaterally, no w/r/r. Normal respiratory effort. Abdomen: soft, ntnd Skin: no rash or induration seen on limited exam, bilateral feet in socks and heel protectors Psychiatric: grossly normal mood and affect, speech quiet and a little slow but intelligible          Labs on Admission:  Basic Metabolic Panel: Recent Labs  Lab 12/23/20 1025  NA 135  K 4.1  CL 100  CO2 25  GLUCOSE 101*  BUN 14  CREATININE 0.72  CALCIUM 8.9   Liver Function Tests: Recent Labs  Lab 12/23/20 1025  AST 23  ALT 39  ALKPHOS 134*  BILITOT 0.9  PROT 7.0  ALBUMIN 3.0*   No results for input(s): LIPASE, AMYLASE in the last 168 hours. No results for input(s): AMMONIA in the last 168 hours. CBC: Recent Labs  Lab 12/23/20 1025  WBC 11.6*  NEUTROABS 7.2  HGB 10.9*  HCT 32.7*  MCV 86.1  PLT 233   Cardiac Enzymes: No  results for input(s): CKTOTAL, CKMB, CKMBINDEX, TROPONINI in the last 168 hours.  BNP (last 3 results) Recent Labs    10/19/20 1113  BNP 30.9    ProBNP (last 3 results) No results for input(s): PROBNP in the last 8760 hours.  CBG: No results for input(s): GLUCAP in the last 168 hours.  Radiological Exams on Admission: CT ABDOMEN PELVIS WO CONTRAST  Result Date: 12/23/2020 CLINICAL DATA:  Abdominal pain with fever. Recent urinary tract infection diagnosis. Possible sepsis. EXAM: CT ABDOMEN AND PELVIS WITHOUT CONTRAST TECHNIQUE: Multidetector CT imaging of the abdomen and pelvis was performed following the standard protocol without IV contrast. COMPARISON:  Abdominopelvic CT 10/19/2020. FINDINGS: Lower chest: Stable bibasilar atelectasis or scarring. No significant pleural  or pericardial effusion. Hepatobiliary: No focal hepatic abnormalities are identified on noncontrast imaging. No evidence of gallstones, gallbladder wall thickening or biliary dilatation. Pancreas: Unremarkable. No pancreatic ductal dilatation or surrounding inflammatory changes. Spleen: Normal in size without focal abnormality. Adrenals/Urinary Tract: Both adrenal glands appear normal. Both kidneys appear unremarkable as imaged in the noncontrast state. There is no evidence of urinary tract calculus, hydronephrosis or perinephric soft tissue stranding. A Foley catheter is in place. The bladder is decompressed with possible mild wall thickening and surrounding inflammation. Stomach/Bowel: No enteric contrast administered. The stomach appears unremarkable for its degree of distension. No evidence of bowel wall thickening, distention or surrounding inflammatory change. The appendix appears normal. There is stool throughout the colon. Vascular/Lymphatic: There are no enlarged abdominal or pelvic lymph nodes. Mild aortic and branch vessel atherosclerosis. Reproductive: The uterus and ovaries appear unremarkable. No adnexal mass. Other:  No evidence of abdominal wall mass or hernia. No ascites. Musculoskeletal: No acute or significant osseous findings. Moderate facet arthropathy at L4-5 and chronic bilateral sacroiliitis, similar to previous study. IMPRESSION: 1. The urinary bladder is decompressed by a Foley catheter and suboptimally evaluated. There is possible bladder wall thickening and surrounding inflammation which could indicate cystitis. 2. No evidence of urinary tract calculus, hydronephrosis or perinephric inflammation. 3. No evidence of bowel inflammation or obstruction. 4. Chronic sacroiliitis and facet arthropathy in the lower lumbar spine. 5.  Aortic Atherosclerosis (ICD10-I70.0). Electronically Signed   By: Carey Bullocks M.D.   On: 12/23/2020 11:47   DG Chest 2 View  Result Date: 12/23/2020 CLINICAL DATA:  Suspected sepsis.  Recent UTI diagnosis. EXAM: CHEST - 2 VIEW COMPARISON:  Radiographs 08/15/2018. FINDINGS: Suboptimal inspiration on the lateral view. No focal airspace disease is seen on the frontal examination. There is stable mild cardiac enlargement. No pleural effusion or pneumothorax. Previous lower cervical fusion without evidence of acute osseous abnormality. Telemetry leads overlie the chest. IMPRESSION: No evidence of active cardiopulmonary process. Stable mild cardiomegaly. Electronically Signed   By: Carey Bullocks M.D.   On: 12/23/2020 11:40    EKG: Independently reviewed.  Sinus tachycardia, abnormal R wave progression most notable in V2 with T wave inversion, no ischemic changes noted.  Overall no significant change compared to prior.  Assessment/Plan Active Problems:   Cervical spondylosis with myelopathy and radiculopathy   Spinal cord infarction [facility])   Depression with anxiety   Neurogenic bladder   Sepsis (HCC)   #Sepsis, likely secondary to urinary source Patient presenting with sepsis physiology likely secondary to urinary source from chronic indwelling Foley.  Already started on  ceftriaxone.  Blood and urine cultures obtained which will be followed up.  Per signout from ED provider and interview with patient no bedsores contributing.  Heart exam normal without concern for endocarditis.  Imaging of chest, abdomen, and pelvis all unremarkable without acute source. - Status post ceftriaxone in the ED, per sepsis order set we will switch to preferred healthcare associated agent cefepime - Follow-up urine and blood cultures - Continue IV fluids overnight - Replace urinary Foley, may need to consider intermittent cathing if this becomes a recurrent issue   #Known Medical problems Spinal cord injury-continue acamprosate, aspirin, atorvastatin, midodrine, tizanidine GI-continue dicyclomine Anxiety/depression-continue duloxetine, trazodone Neuropathy-continue Lyrica MSK-continue meloxicam GERD-continue PPI  Code Status: Full code, confirmed DVT Prophylaxis: Lovenox Family Communication: Daughter Joylene Igo updated at bedside Disposition Plan: Observation, MedSurg  Time spent: 50 min  Venora Maples MD/MPH [facility]  Note:  This document was prepared using  [employer] training and education officer and may include unintentional dictation errors.

## 2020-12-23 NOTE — Progress Notes (Signed)
PHARMACIST - PHYSICIAN COMMUNICATION  CONCERNING:  Enoxaparin (Lovenox) for DVT Prophylaxis    RECOMMENDATION: Patient was prescribed enoxaprin 40mg  q24 hours for VTE prophylaxis.   Filed Weights   12/23/20 1610  Weight: 116.3 kg (256 lb 6.3 oz)    Body mass index is 42.67 kg/m.  Estimated Creatinine Clearance: 103.6 mL/min (by C-G formula based on SCr of 0.72 mg/dL).   Based on [facility] policy patient is candidate for enoxaparin 0.5mg /kg TBW SQ every 24 hours based on BMI being >30.   DESCRIPTION: Pharmacy has adjusted enoxaparin dose per [facility] policy.  Patient is now receiving enoxaparin 60 mg every 24 hours    [facility], PharmD, BCPS Clinical Pharmacist  12/23/2020 4:20 PM

## 2020-12-23 NOTE — Consult Note (Signed)
Pharmacy Antibiotic Note  Wendy Nelson is a 55 y.o. female admitted on 12/23/2020 with sepsis and UTI.  Pharmacy has been consulted for cefepime dosing.  Plan: Cefepime 2 gram Q8H  Height: 5\' 5"  (165.1 cm) IBW/kg (Calculated) : 57  Temp (24hrs), Avg:99.9 F (37.7 C), Min:99.4 F (37.4 C), Max:100.3 F (37.9 C)  Recent Labs  Lab 12/23/20 1025  WBC 11.6*  CREATININE 0.72  LATICACIDVEN 0.8    CrCl cannot be calculated (Unknown ideal weight.).    Allergies  Allergen Reactions   Melatonin    Neomycin Other (See Comments)    Positive patch test   Sulfa Antibiotics     Antimicrobials this admission: 6/10 ceftriaxone >> x 1 6/10 cefepime >>   Dose adjustments this admission: N/a  Microbiology results: 6/10 BCx: sent 6/10 UCx: sent    Thank you for allowing pharmacy to be a part of this patient's care.  8/10, PharmD, BCPS Clinical Pharmacist   12/23/2020 4:00 PM

## 2020-12-23 NOTE — ED Notes (Signed)
EMS, this RN and Sam RN unable to start IV, order placed for Iv team.

## 2020-12-23 NOTE — Progress Notes (Addendum)
IV Team here - ED RN  Florentina Addison had achieved IV access per Lt hand - no IV consult needed - told to replace IV Team Consult if 2nd line needed.

## 2020-12-23 NOTE — ED Provider Notes (Signed)
[facility] Emergency Department Provider Note  ____________________________________________   Event Date/Time   First MD Initiated Contact with Patient 12/23/20 1015     (approximate)  I have reviewed the triage vital signs and the nursing notes.   HISTORY  Chief Complaint Urinary Tract Infection   HPI Wendy PlumpSherry Eisinger is a 55 y.o. 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  Patient reportedly is taking Keflex for UTI and had a Foley changed last week.  She denies any recent injuries or falls, cough, shortness of breath, headache, earache, sore throat or any other clear associated sick symptoms.  She notes she is not able to feel anything in her lower abdomen and is not sure if she has had any diarrhea.  No other history is made available on patient arrival.          Past Medical History:  Diagnosis Date   Stroke (HCC) 09/2020    Patient Active Problem List   Diagnosis Date Noted   Sepsis (HCC) 12/23/2020   Neurogenic bladder    Syncope 10/19/2020   AKI (acute kidney injury) (HCC) 10/19/2020   Bilateral hydronephrosis 10/19/2020   HTN (hypertension) 10/19/2020   HLD (hyperlipidemia) 10/19/2020   Depression with anxiety 10/19/2020   Hypotension 09/2020   Cervical spondylosis with myelopathy and radiculopathy 09/2020   Spinal cord infarction [facility](HCC) 09/2020    Past Surgical History:  Procedure Laterality Date   CERVICAL SPINE SURGERY      Prior to Admission medications   Medication Sig Start Date End Date Taking? Authorizing Provider  acamprosate (CAMPRAL) 333 MG tablet Take 333 mg by mouth 3 (three) times daily. 09/05/20  Yes [provider]  Acetaminophen 500 MG capsule  Take 1,000 mg by mouth every 6 (six) hours as needed. 10/12/20  Yes [provider]  aspirin 325 MG tablet Take 325 mg by mouth daily. 09/24/20 09/24/21 Yes [provider]  atorvastatin (LIPITOR) 10 MG tablet Take 10 mg by mouth daily.   Yes [provider]  cephALEXin (KEFLEX) 500 MG capsule Take 500 mg by mouth 2 (two) times daily. 12/22/20  Yes [provider]  dicyclomine (BENTYL) 10 MG capsule Take 10 mg by mouth 3 (three) times daily with meals. 08/04/20 08/04/21 Yes [provider]  DULoxetine (CYMBALTA) 30 MG capsule Take 60 mg by mouth daily. 10/12/20  Yes [provider]  lidocaine (LIDODERM) 5 % Place 1 patch onto the skin daily. Remove & Discard patch within 12 hours or as directed by MD 10/21/20  Yes Enedina FinnerPatel, Sona, MD  meloxicam (MOBIC) 7.5 MG tablet Take 7.5 mg by mouth daily. 09/04/20  Yes [provider]  midodrine (PROAMATINE) 10 MG tablet Take 10 mg by mouth 3 (three) times daily. 12/14/20  Yes [provider]  Multiple Vitamin (MULTIVITAMIN) tablet Take 1 tablet by mouth daily.   Yes [provider]  omeprazole (PRILOSEC) 40 MG capsule Take 1 capsule by mouth 2 (two) times daily. 12/11/20  Yes [provider]  oxyCODONE-acetaminophen (PERCOCET/ROXICET) 5-325 MG tablet Take 1 tablet by mouth every 8 (eight) hours as needed. 08/18/20  Yes [provider]  polyethylene glycol (MIRALAX) 17 g packet Take 17 g by mouth daily.   Yes [provider]  pregabalin (LYRICA) 100 MG capsule Take 1 capsule by mouth 2 (two) times daily. 10/12/20  Yes [provider]  senna-docusate (SENOKOT-S) 8.6-50 MG tablet Take 1 tablet by mouth daily.   Yes [provider]  tiZANidine (ZANAFLEX) 2 MG tablet Take 2 mg by mouth 2 (two) times daily. 09/14/20  Yes [provider]  traZODone (DESYREL) 50 MG tablet Take 50 mg by mouth at bedtime. 07/25/20  Yes [provider]  vitamin C  (ASCORBIC ACID) 500 MG tablet Take 500 mg by mouth 2 (two) times daily.   Yes [provider]  amLODipine (NORVASC) 5 MG tablet Take 1 tablet by mouth daily. Patient not taking: Reported on 12/23/2020 12/18/20   [provider]  amoxicillin (AMOXIL) 875 MG tablet Take 1 tablet by mouth. Patient not taking: No sig reported    [provider]  azithromycin (ZITHROMAX) 500 MG tablet Take 1 tablet by mouth daily. Patient not taking: No sig reported    [provider]  buprenorphine (SUBUTEX) 2 MG SUBL SL tablet Place 2 tablets under the tongue daily. Patient not taking: No sig reported 09/12/20   [provider]  fluorometholone (FML) 0.1 % ophthalmic suspension Place 1 drop into the right eye as needed. Patient not taking: Reported on 12/23/2020    [provider]  hydrochlorothiazide (HYDRODIURIL) 25 MG tablet Take 1 tablet by mouth daily. Patient not taking: Reported on 12/23/2020 12/14/16   [provider]  losartan (COZAAR) 100 MG tablet Take 1 tablet by mouth daily. Patient not taking: Reported on 12/23/2020 11/28/20   [provider]  omeprazole (PRILOSEC) 20 MG capsule Take 40 mg by mouth 2 (two) times daily. Patient not taking: Reported on 12/23/2020 08/04/20   [provider]    Allergies Melatonin, Neomycin, and Sulfa antibiotics  Family History  Problem Relation Age of Onset   Diabetes Mellitus II Mother    Hypertension Mother    Diabetes Mellitus II Brother     Social History Social History   Tobacco Use   Smoking status: Never   Smokeless tobacco: Never    Review of Systems  Review of Systems  Constitutional:  Positive for chills, fever and malaise/fatigue.  HENT:  Negative for sore throat.   Eyes:  Negative for pain.  Respiratory:  Negative for cough and stridor.   Cardiovascular:  Negative for chest pain.  Gastrointestinal:  Positive for nausea. Negative for vomiting.  Genitourinary:   Negative for dysuria.  Musculoskeletal:  Positive for myalgias.  Skin:  Negative for rash.  Neurological:  Positive for weakness. Negative for seizures, loss of consciousness and headaches.  Psychiatric/Behavioral:  Negative for suicidal ideas.   All other systems reviewed and are negative.    ____________________________________________   PHYSICAL EXAM:  VITAL SIGNS: ED Triage Vitals  Enc Vitals Group     BP      Pulse      Resp      Temp      Temp src      SpO2      Weight      Height      Head Circumference      Peak Flow      Pain Score      Pain Loc      Pain Edu?      Excl. in GC?    Vitals:   12/23/20 1430 12/23/20 1500  BP: 130/83 140/90  Pulse: 90 86  Resp: 16 16  Temp:    SpO2: 96% 95%   Physical Exam Vitals and nursing note reviewed.  Constitutional:      General: She is not in acute distress.    Appearance: She is well-developed. She is obese. She is ill-appearing.  HENT:     Head: Normocephalic and atraumatic.     Right Ear: External ear normal.     Left Ear: External ear normal.     Nose: Nose normal.     Mouth/Throat:     Mouth: Mucous membranes are dry.  Eyes:     Conjunctiva/sclera: Conjunctivae normal.  Cardiovascular:     Rate and Rhythm: Regular rhythm. Tachycardia present.     Heart sounds: No murmur heard. Pulmonary:     Effort: Pulmonary effort is normal. No respiratory distress.     Breath sounds: Normal breath sounds.  Abdominal:     Palpations: Abdomen is soft.     Tenderness: There is no abdominal tenderness.  Musculoskeletal:     Cervical back: Neck supple.  Skin:    General: Skin is warm and dry.     Capillary Refill: Capillary refill takes more than 3 seconds.  Neurological:     Mental Status: She is alert and oriented to person, place, and time.  Psychiatric:        Mood and Affect: Mood normal.    No large sacral wounds or pressure ulcers noted.  Extremities have no significant erythema induration or obvious foci  of infection. ____________________________________________   LABS (all labs ordered are listed, but only abnormal results are displayed)  Labs Reviewed  COMPREHENSIVE METABOLIC PANEL - Abnormal; Notable for the following components:      Result Value   Glucose, Bld 101 (*)    Albumin 3.0 (*)    Alkaline Phosphatase 134 (*)    All other components within normal limits  CBC WITH DIFFERENTIAL/PLATELET - Abnormal; Notable for the following components:   WBC 11.6 (*)    RBC 3.80 (*)    Hemoglobin 10.9 (*)    HCT 32.7 (*)    Monocytes Absolute 1.2 (*)    All other components within normal limits  URINALYSIS, COMPLETE (UACMP) WITH MICROSCOPIC - Abnormal; Notable for the following components:   Color, Urine STRAW (*)    APPearance CLEAR (*)    Specific Gravity, Urine 1.003 (*)    Leukocytes,Ua MODERATE (*)    Bacteria, UA RARE (*)    All other components within normal limits  CULTURE, BLOOD (ROUTINE X 2)  CULTURE, BLOOD (ROUTINE X 2)  URINE CULTURE  SARS CORONAVIRUS 2 (TAT 6-24 HRS)  LACTIC ACID, PLASMA  PROTIME-INR  APTT   ____________________________________________  EKG  Sinus tachycardia with ventricular rate of 107, right axis deviation, unremarkable intervals without clearance of acute ischemia or significant underlying arrhythmia. ____________________________________________  RADIOLOGY  ED MD interpretation: CT abdomen pelvis with Foley in place and decompressed bladder without clear thickening although somewhat difficult to exclude.  No perinephric stranding, kidney stone, diverticulitis, cholecystitis, pancreatitis or other clear acute abdominopelvic process.  No SBO.  Chest x-ray has no evidence of pneumonia, pneumothorax, effusion, edema or other clear acute thoracic process.   Official radiology report(s): CT ABDOMEN PELVIS WO CONTRAST  Result Date: 12/23/2020 CLINICAL DATA:  Abdominal pain with fever. Recent urinary tract infection diagnosis. Possible sepsis.  EXAM: CT ABDOMEN AND PELVIS WITHOUT CONTRAST TECHNIQUE: Multidetector CT imaging of the abdomen and pelvis was performed following the standard protocol without IV contrast. COMPARISON:  Abdominopelvic CT 10/19/2020.  FINDINGS: Lower chest: Stable bibasilar atelectasis or scarring. No significant pleural or pericardial effusion. Hepatobiliary: No focal hepatic abnormalities are identified on noncontrast imaging. No evidence of gallstones, gallbladder wall thickening or biliary dilatation. Pancreas: Unremarkable. No pancreatic ductal dilatation or surrounding inflammatory changes. Spleen: Normal in size without focal abnormality. Adrenals/Urinary Tract: Both adrenal glands appear normal. Both kidneys appear unremarkable as imaged in the noncontrast state. There is no evidence of urinary tract calculus, hydronephrosis or perinephric soft tissue stranding. A Foley catheter is in place. The bladder is decompressed with possible mild wall thickening and surrounding inflammation. Stomach/Bowel: No enteric contrast administered. The stomach appears unremarkable for its degree of distension. No evidence of bowel wall thickening, distention or surrounding inflammatory change. The appendix appears normal. There is stool throughout the colon. Vascular/Lymphatic: There are no enlarged abdominal or pelvic lymph nodes. Mild aortic and branch vessel atherosclerosis. Reproductive: The uterus and ovaries appear unremarkable. No adnexal mass. Other: No evidence of abdominal wall mass or hernia. No ascites. Musculoskeletal: No acute or significant osseous findings. Moderate facet arthropathy at L4-5 and chronic bilateral sacroiliitis, similar to previous study. IMPRESSION: 1. The urinary bladder is decompressed by a Foley catheter and suboptimally evaluated. There is possible bladder wall thickening and surrounding inflammation which could indicate cystitis. 2. No evidence of urinary tract calculus, hydronephrosis or perinephric  inflammation. 3. No evidence of bowel inflammation or obstruction. 4. Chronic sacroiliitis and facet arthropathy in the lower lumbar spine. 5.  Aortic Atherosclerosis (ICD10-I70.0). Electronically Signed   By: Carey Bullocks M.D.   On: 12/23/2020 11:47   DG Chest 2 View  Result Date: 12/23/2020 CLINICAL DATA:  Suspected sepsis.  Recent UTI diagnosis. EXAM: CHEST - 2 VIEW COMPARISON:  Radiographs 08/15/2018. FINDINGS: Suboptimal inspiration on the lateral view. No focal airspace disease is seen on the frontal examination. There is stable mild cardiac enlargement. No pleural effusion or pneumothorax. Previous lower cervical fusion without evidence of acute osseous abnormality. Telemetry leads overlie the chest. IMPRESSION: No evidence of active cardiopulmonary process. Stable mild cardiomegaly. Electronically Signed   By: Carey Bullocks M.D.   On: 12/23/2020 11:40    ____________________________________________   PROCEDURES  Procedure(s) performed (including Critical Care):  .Critical Care  Date/Time: 12/23/2020 3:39 PM Performed by: Gilles Chiquito, MD Authorized by: Gilles Chiquito, MD   Critical care provider statement:    Critical care time (minutes):  45   Critical care was necessary to treat or prevent imminent or life-threatening deterioration of the following conditions:  Sepsis   Critical care was time spent personally by me on the following activities:  Discussions with consultants, evaluation of patient's response to treatment, examination of patient, ordering and performing treatments and interventions, ordering and review of laboratory studies, ordering and review of radiographic studies, pulse oximetry, re-evaluation of patient's condition, obtaining history from patient or surrogate and review of old charts   ____________________________________________   INITIAL IMPRESSION / ASSESSMENT AND PLAN / ED COURSE     Patient presents with above-stated history exam for  assessment of some nausea and fever and concerns that she may be septic from urinary tract infection currently being treated with Keflex.  On arrival patient is noted to have an elevated temperature of 100.7 and a slight tachypneic in the low 20s with otherwise stable vital signs on room air.  However on the monitor she is tachycardic at 105 my assessment.  EKG shows a rate of 107.  Differential includes possible symptomatic arrhythmia, ACS, and acute infectious  process.  No obvious evidence of cellulitis or sacral injury on exam.    CT abdomen pelvis with Foley in place and decompressed bladder without clear thickening although somewhat difficult to exclude.  No perinephric stranding, kidney stone, diverticulitis, cholecystitis, pancreatitis or other clear acute abdominopelvic process.  No SBO. Marland Kitchen Chest x-ray shows no clear evidence of pneumonia pneumothorax effusion or other acute intrathoracic process.  ECG is not suggestive of ischemia.   CMP without any significant electrode or metabolic derangements.  CBC with leukocytosis with WBC count of 11.6 and hemoglobin of 10.9 which is at baseline.  UA obtained after Foley replaced shows moderate leukocyte esterase with 21-50 WBCs and rare bacteria consistent with cystitis.  Given tachycardia seen on the monitor and EKG with leukocytosis and concern for possible sepsis with source being urine.  Blood and urine cultures were ordered and patient was given IV fluids and Rocephin.  Admit to medicine service for further evaluation and management.     ____________________________________________   FINAL CLINICAL IMPRESSION(S) / ED DIAGNOSES  Final diagnoses:  Urinary tract infection associated with indwelling urethral catheter, initial encounter (HCC)  Sepsis, due to unspecified organism, unspecified whether acute organ dysfunction present (HCC)    Medications  aspirin tablet 325 mg (has no administration in time range)  meloxicam (MOBIC) tablet 7.5  mg (has no administration in time range)  atorvastatin (LIPITOR) tablet 10 mg (has no administration in time range)  midodrine (PROAMATINE) tablet 10 mg (has no administration in time range)  acamprosate (CAMPRAL) tablet 333 mg (has no administration in time range)  DULoxetine (CYMBALTA) DR capsule 60 mg (has no administration in time range)  traZODone (DESYREL) tablet 50 mg (has no administration in time range)  dicyclomine (BENTYL) capsule 10 mg (has no administration in time range)  pantoprazole (PROTONIX) EC tablet 80 mg (has no administration in time range)  polyethylene glycol (MIRALAX / GLYCOLAX) packet 17 g (has no administration in time range)  pregabalin (LYRICA) capsule 100 mg (has no administration in time range)  tiZANidine (ZANAFLEX) tablet 2 mg (has no administration in time range)  multivitamin with minerals tablet 1 tablet (has no administration in time range)  ascorbic acid (VITAMIN C) tablet 500 mg (has no administration in time range)  enoxaparin (LOVENOX) injection 40 mg (has no administration in time range)  lactated ringers infusion (has no administration in time range)  ceFEPIme (MAXIPIME) 2 g in sodium chloride 0.9 % 100 mL IVPB (has no administration in time range)  acetaminophen (TYLENOL) tablet 650 mg (has no administration in time range)    Or  acetaminophen (TYLENOL) suppository 650 mg (has no administration in time range)  oxyCODONE (Oxy IR/ROXICODONE) immediate release tablet 5 mg (has no administration in time range)  methocarbamol (ROBAXIN) 500 mg in dextrose 5 % 50 mL IVPB (has no administration in time range)  ondansetron (ZOFRAN) tablet 4 mg (has no administration in time range)    Or  ondansetron (ZOFRAN) injection 4 mg (has no administration in time range)  ceFEPIme (MAXIPIME) 2 g in sodium chloride 0.9 % 100 mL IVPB (has no administration in time range)  0.9 %  sodium chloride infusion (has no administration in time range)  lactated ringers bolus  1,000 mL (0 mLs Intravenous Stopped 12/23/20 1356)  cefTRIAXone (ROCEPHIN) 1 g in sodium chloride 0.9 % 100 mL IVPB (0 g Intravenous Stopped 12/23/20 1230)  ondansetron (ZOFRAN) injection 4 mg (4 mg Intravenous Given 12/23/20 1133)     ED Discharge Orders  None        Note:  This document was prepared using Dragon voice recognition software and may include unintentional dictation errors.    Gilles Chiquito, MD 12/23/20 1540

## 2020-12-23 NOTE — ED Triage Notes (Signed)
Pt to ER via [facility] from [facility] where she is staying for Rehab. Pt was diagnosed with a UTI on Wednesday. Has chronic foley in place, reports changed monthly at facility and believes it was changed last week. Pt with spinal cord injury/ quadriplegia/ right sided deficits from previous stroke.   Pt has been receiving keflex for UTI.  EMS VS- fever 102.6, bp 153/106, HR 100-107, RA sats 96%. RR 30. CBG 124.

## 2020-12-23 NOTE — ED Notes (Signed)
14 Fr foley w/ 30cc balloon in place on arrival, removed per MD Katrinka Blazing and changed at this time.

## 2020-12-23 NOTE — ED Notes (Signed)
Pt to xray

## 2020-12-24 DIAGNOSIS — R502 Drug induced fever: Secondary | ICD-10-CM

## 2020-12-24 DIAGNOSIS — B353 Tinea pedis: Secondary | ICD-10-CM

## 2020-12-24 DIAGNOSIS — A419 Sepsis, unspecified organism: Secondary | ICD-10-CM | POA: Diagnosis not present

## 2020-12-24 DIAGNOSIS — N319 Neuromuscular dysfunction of bladder, unspecified: Secondary | ICD-10-CM | POA: Diagnosis not present

## 2020-12-24 DIAGNOSIS — T83511A Infection and inflammatory reaction due to indwelling urethral catheter, initial encounter: Secondary | ICD-10-CM | POA: Diagnosis not present

## 2020-12-24 LAB — BASIC METABOLIC PANEL
Anion gap: 7 (ref 5–15)
BUN: 14 mg/dL (ref 6–20)
CO2: 29 mmol/L (ref 22–32)
Calcium: 9 mg/dL (ref 8.9–10.3)
Chloride: 104 mmol/L (ref 98–111)
Creatinine, Ser: 0.72 mg/dL (ref 0.44–1.00)
GFR, Estimated: >60 mL/min (ref >60)
Glucose, Bld: 77 mg/dL (ref 70–99)
Potassium: 4.1 mmol/L (ref 3.5–5.1)
Sodium: 140 mmol/L (ref 135–145)

## 2020-12-24 LAB — CBC
HCT: 32.5 % — ABNORMAL LOW (ref 36.0–46.0)
Hemoglobin: 10.6 g/dL — ABNORMAL LOW (ref 12.0–15.0)
MCH: 28 pg (ref 26.0–34.0)
MCHC: 32.6 g/dL (ref 30.0–36.0)
MCV: 85.8 fL (ref 80.0–100.0)
Platelets: 231 10*3/uL (ref 150–400)
RBC: 3.79 MIL/uL — ABNORMAL LOW (ref 3.87–5.11)
RDW: 15.3 % (ref 11.5–15.5)
WBC: 8 10*3/uL (ref 4.0–10.5)
nRBC: 0 % (ref 0.0–0.2)

## 2020-12-24 LAB — PROCALCITONIN: Procalcitonin: 0.25 ng/mL

## 2020-12-24 LAB — SARS CORONAVIRUS 2 (TAT 6-24 HRS): SARS Coronavirus 2: NEGATIVE

## 2020-12-24 MED ORDER — TERBINAFINE HCL 1 % EX CREA
TOPICAL_CREAM | Freq: Two times a day (BID) | CUTANEOUS | Status: DC
  Filled 2020-12-24: qty 12

## 2020-12-24 NOTE — Progress Notes (Signed)
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  Reportedly febrile to 103 at nursing facility and started on Keflex for presumed UTI.  In ED, mild temp to 100.3.  Tachycardic to 107.  Tachypnea low 20s.  Leukocytosis to 11.6.  Hgb 10.9.  Otherwise, CBC and CMP without significant finding.  UA with moderate LE and rare bacteria.  CXR with mild cardiomegaly.  CT abdomen and pelvis w/o contrast with decompressed bladder but possible bladder wall thickening suggestive for cystitis.  Cultures obtained.  Patient was started on IV ceftriaxone.  Antibiotics escalated to IV cefepime on admission.  Subjective: Seen and examined earlier this morning.  No major events overnight of this morning.  She has no complaints.  She denies chest pain, dyspnea, nausea, vomiting or abdominal pain.  She reports cough which is at her baseline.  Objective: Vitals:   12/23/20 1954 12/24/20 0016 12/24/20 0456 12/24/20 0829  BP: (!) 156/85 115/68 129/79 136/76  Pulse: 81 83 94 (!) 104  Resp: 20 20 18 20   Temp: 100.1 F (37.8 C) 99.1 F (37.3 C) 99.7 F (37.6 C) 99.6 F (37.6 C)  TempSrc: Oral Oral Oral Oral  SpO2: 98% (!) 89% 94% 92%  Weight:      Height:        Intake/Output Summary (Last 24 hours) at 12/24/2020 1250 Last data filed at 12/24/2020 0400 Gross per 24 hour  Intake 2460.96 ml  Output --  Net 2460.96 ml   Filed Weights   12/23/20 1610  Weight: 116.3 kg    Examination:  GENERAL: No apparent distress.  Nontoxic. HEENT: MMM.  Vision and hearing grossly intact.  NECK: Supple.  No  apparent JVD.  RESP:  No IWOB.  Fair aeration bilaterally. CVS:  RRR. Heart sounds normal.  ABD/GI/GU: BS+. Abd soft, NTND.  Indwelling Foley with clear yellow urine MSK/EXT: BLE diplegia.  Splint to right wrist for wrist drop.  SKIN: Clean looking stage I pressure ulcer of left heel.  No signs of infection.  Tinea pedis bilaterally NEURO: Awake, alert and oriented appropriately.  No apparent focal neuro deficit other than known BLE diplegia and right wrist drop. PSYCH: Calm. Normal affect.   Procedures:  None  Microbiology summarized: COVID-19 PCR negative. Blood cultures NGTD. Urine culture with 10,000 colonies of GNR  Assessment & Plan: Sepsis, likely due to catheter associated UTI in patient with chronic indwelling Foley.  Recently started on Keflex outpatient.  UA with moderate LE and rare bacteria.  Urine culture with GNR. -Foley replaced. -Continue IV cefepime pending urine culture speciation and sensitivity  Possible tinea pedis -Terbinafine cream twice daily for 2 weeks   BLE diplegia/spinal cord injury/chronic pain -Continue home pain medications-Lyrica, tizanidine, Mobic and as needed oxy  History of hypotension:  On midodrine.  BP elevated this morning. -Discontinue IV fluid and midodrine  Anxiety/depression: Stable -continue duloxetine, trazodone  Neuropathy-continue Lyrica  History of CVA: -Continue statin and aspirin.  Morbid obesity Body mass index is 42.67 kg/m.         DVT prophylaxis:    Subcu Lovenox  Code Status: Full code Family Communication: Patient  and/or RN. Available if any question.  Level of care: Med-Surg Status is: Observation  The patient will require care spanning > 2 midnights and should be moved to inpatient because: IV treatments appropriate due to intensity of illness or inability to take PO and Inpatient level of care appropriate due to severity of illness  Dispo: The patient is from: ALF              Anticipated d/c is  to: ALF              Patient currently is not medically stable to d/c.   Difficult to place patient No       Consultants:  None   Sch Meds:  Scheduled Meds:  acamprosate  333 mg Oral TID   vitamin C  500 mg Oral BID   aspirin EC  325 mg Oral Daily   atorvastatin  10 mg Oral Daily   Chlorhexidine Gluconate Cloth  6 each Topical Daily   dicyclomine  10 mg Oral TID WC   DULoxetine  60 mg Oral Daily   enoxaparin (LOVENOX) injection  60 mg Subcutaneous Q24H   meloxicam  7.5 mg Oral Daily   multivitamin with minerals  1 tablet Oral Daily   pantoprazole  80 mg Oral Daily   polyethylene glycol  17 g Oral Daily   pregabalin  100 mg Oral BID   terbinafine   Topical BID   tiZANidine  2 mg Oral BID   traZODone  50 mg Oral QHS   Continuous Infusions:  sodium chloride     ceFEPime (MAXIPIME) IV 2 g (12/24/20 0517)   methocarbamol (ROBAXIN) IV     PRN Meds:.sodium chloride, acetaminophen **OR** acetaminophen, methocarbamol (ROBAXIN) IV, ondansetron **OR** ondansetron (ZOFRAN) IV, oxyCODONE  Antimicrobials: Anti-infectives (From admission, onward)    Start     Dose/Rate Route Frequency Ordered Stop   12/24/20 0500  ceFEPIme (MAXIPIME) 2 g in sodium chloride 0.9 % 100 mL IVPB  Status:  Discontinued        2 g 200 mL/hr over 30 Minutes Intravenous Every 12 hours 12/23/20 1504 12/23/20 1619   12/23/20 1700  ceFEPIme (MAXIPIME) 2 g in sodium chloride 0.9 % 100 mL IVPB        2 g 200 mL/hr over 30 Minutes Intravenous Every 8 hours 12/23/20 1619     12/23/20 1600  ceFEPIme (MAXIPIME) 2 g in sodium chloride 0.9 % 100 mL IVPB  Status:  Discontinued        2 g 200 mL/hr over 30 Minutes Intravenous  Once 12/23/20 1501 12/23/20 1619   12/23/20 1045  cefTRIAXone (ROCEPHIN) 1 g in sodium chloride 0.9 % 100 mL IVPB        1 g 200 mL/hr over 30 Minutes Intravenous  Once 12/23/20 1035 12/23/20 1230        I have personally reviewed the following labs and images: CBC: Recent Labs  Lab  12/23/20 1025 12/24/20 0532  WBC 11.6* 8.0  NEUTROABS 7.2  --   HGB 10.9* 10.6*  HCT 32.7* 32.5*  MCV 86.1 85.8  PLT 233 231   BMP &GFR Recent Labs  Lab 12/23/20 1025 12/24/20 0532  NA 135 140  K 4.1 4.1  CL 100 104  CO2 25 29  GLUCOSE 101* 77  BUN 14 14  CREATININE 0.72 0.72  CALCIUM 8.9 9.0   Estimated Creatinine Clearance: 103.6 mL/min (by C-G formula based on SCr of 0.72 mg/dL). Liver & Pancreas: Recent  Labs  Lab 12/23/20 1025  AST 23  ALT 39  ALKPHOS 134*  BILITOT 0.9  PROT 7.0  ALBUMIN 3.0*   No results for input(s): LIPASE, AMYLASE in the last 168 hours. No results for input(s): AMMONIA in the last 168 hours. Diabetic: No results for input(s): HGBA1C in the last 72 hours. No results for input(s): GLUCAP in the last 168 hours. Cardiac Enzymes: No results for input(s): CKTOTAL, CKMB, CKMBINDEX, TROPONINI in the last 168 hours. No results for input(s): PROBNP in the last 8760 hours. Coagulation Profile: Recent Labs  Lab 12/23/20 1025  INR 1.2   Thyroid Function Tests: No results for input(s): TSH, T4TOTAL, FREET4, T3FREE, THYROIDAB in the last 72 hours. Lipid Profile: No results for input(s): CHOL, HDL, LDLCALC, TRIG, CHOLHDL, LDLDIRECT in the last 72 hours. Anemia Panel: No results for input(s): VITAMINB12, FOLATE, FERRITIN, TIBC, IRON, RETICCTPCT in the last 72 hours. Urine analysis:    Component Value Date/Time   COLORURINE STRAW (A) 12/23/2020 1026   APPEARANCEUR CLEAR (A) 12/23/2020 1026   LABSPEC 1.003 (L) 12/23/2020 1026   PHURINE 7.0 12/23/2020 1026   GLUCOSEU NEGATIVE 12/23/2020 1026   HGBUR NEGATIVE 12/23/2020 1026   BILIRUBINUR NEGATIVE 12/23/2020 1026   KETONESUR NEGATIVE 12/23/2020 1026   PROTEINUR NEGATIVE 12/23/2020 1026   NITRITE NEGATIVE 12/23/2020 1026   LEUKOCYTESUR MODERATE (A) 12/23/2020 1026   Sepsis Labs: Invalid input(s): PROCALCITONIN, LACTICIDVEN  Microbiology: Recent Results (from the past 240 hour(s))   Culture, blood (Routine x 2)     Status: None (Preliminary result)   Collection Time: 12/23/20 10:25 AM   Specimen: BLOOD LEFT HAND  Result Value Ref Range Status   Specimen Description BLOOD LEFT HAND  Final   Special Requests   Final    BOTTLES DRAWN AEROBIC AND ANAEROBIC Blood Culture adequate volume   Culture   Final    NO GROWTH < 24 HOURS Performed at [facility], [address], [place], [place] [address]    Report Status PENDING  Incomplete  Culture, blood (Routine x 2)     Status: None (Preliminary result)   Collection Time: 12/23/20 10:30 AM   Specimen: Right Antecubital; Blood  Result Value Ref Range Status   Specimen Description RIGHT ANTECUBITAL  Final   Special Requests   Final    BOTTLES DRAWN AEROBIC AND ANAEROBIC Blood Culture adequate volume   Culture   Final    NO GROWTH < 24 HOURS Performed at [facility], [address] [place], [place] [address]    Report Status PENDING  Incomplete  Urine culture     Status: Abnormal (Preliminary result)   Collection Time: 12/23/20 10:36 AM   Specimen: Urine, Random  Result Value Ref Range Status   Specimen Description   Final    URINE, RANDOM Performed at [facility], [address] [place], [place] [address]    Special Requests   Final    NONE Performed at [facility], [address], [place], [place] [address]    Culture (A)  Final    10,000 COLONIES/mL Romie Minus NEGATIVE RODS SUSCEPTIBILITIES TO FOLLOW Performed at [facility], [address] [place], [place] [address]    Report Status PENDING  Incomplete  SARS CORONAVIRUS 2 (TAT 6-24 HRS) Nasopharyngeal Nasopharyngeal Swab     Status: None   Collection Time: 12/23/20  1:20 PM   Specimen: Nasopharyngeal Swab  Result Value Ref Range Status   SARS Coronavirus 2 NEGATIVE NEGATIVE Final  Comment: (NOTE) SARS-CoV-2 target nucleic acids are NOT DETECTED.  The SARS-CoV-2 RNA is generally detectable in upper and  lower respiratory specimens during the acute phase of infection. Negative results do not preclude SARS-CoV-2 infection, do not rule out co-infections with other pathogens, and should not be used as the sole basis for treatment or other patient management decisions. Negative results must be combined with clinical observations, patient history, and epidemiological information. The expected result is Negative.  Fact Sheet for Patients: HairSlick.nohttps://www.fda.gov/media/138098/download  Fact Sheet for Healthcare Providers: quierodirigir.comhttps://www.fda.gov/media/138095/download  This test is not yet approved or cleared by the [place] FDA and  has been authorized for detection and/or diagnosis of SARS-CoV-2 by FDA under an Emergency Use Authorization (EUA). This EUA will remain  in effect (meaning this test can be used) for the duration of the COVID-19 declaration under Se ction 564(b)(1) of the Act, 21 U.S.C. section 360bbb-3(b)(1), unless the authorization is terminated or revoked sooner.  Performed at [facility], [address] [place], [place] [id number]     Radiology Studies: No results found.     Nyzir Dubois T. Yukiko Minnich [facility]  If 7PM-7AM, please contact night-coverage [email or web address] 12/24/2020, 12:50 PM

## 2020-12-24 NOTE — Plan of Care (Signed)
Pt rested during overnight. Turned approx. Every 2 hours. Heel protectors on. Foley patent. Last BM smear 6/10. Med compliant. Oxycodone given prn for Right arm pain. Vitals stable with slightly elevated temp during overnight 100.1 too 99.1 this am.  Problem: Education: Goal: Knowledge of General Education information will improve Description: Including pain rating scale, medication(s)/side effects and non-pharmacologic comfort measures Outcome: Progressing   Problem: Health Behavior/Discharge Planning: Goal: Ability to manage health-related needs will improve Outcome: Progressing   Problem: Clinical Measurements: Goal: Ability to maintain clinical measurements within normal limits will improve Outcome: Progressing Goal: Will remain free from infection Outcome: Progressing Goal: Diagnostic test results will improve Outcome: Progressing Goal: Respiratory complications will improve Outcome: Progressing Goal: Cardiovascular complication will be avoided Outcome: Progressing   Problem: Activity: Goal: Risk for activity intolerance will decrease Outcome: Progressing   Problem: Nutrition: Goal: Adequate nutrition will be maintained Outcome: Progressing   Problem: Coping: Goal: Level of anxiety will decrease Outcome: Progressing   Problem: Elimination: Goal: Will not experience complications related to bowel motility Outcome: Progressing Goal: Will not experience complications related to urinary retention Outcome: Progressing   Problem: Pain Managment: Goal: General experience of comfort will improve Outcome: Progressing   Problem: Safety: Goal: Ability to remain free from injury will improve Outcome: Progressing   Problem: Skin Integrity: Goal: Risk for impaired skin integrity will decrease Outcome: Progressing

## 2020-12-25 DIAGNOSIS — G9511 Acute infarction of spinal cord (embolic) (nonembolic): Secondary | ICD-10-CM | POA: Diagnosis not present

## 2020-12-25 DIAGNOSIS — N390 Urinary tract infection, site not specified: Secondary | ICD-10-CM | POA: Diagnosis not present

## 2020-12-25 DIAGNOSIS — R509 Fever, unspecified: Secondary | ICD-10-CM | POA: Diagnosis present

## 2020-12-25 DIAGNOSIS — N319 Neuromuscular dysfunction of bladder, unspecified: Secondary | ICD-10-CM | POA: Diagnosis present

## 2020-12-25 DIAGNOSIS — M4712 Other spondylosis with myelopathy, cervical region: Secondary | ICD-10-CM | POA: Diagnosis present

## 2020-12-25 DIAGNOSIS — Z7982 Long term (current) use of aspirin: Secondary | ICD-10-CM | POA: Diagnosis not present

## 2020-12-25 DIAGNOSIS — F419 Anxiety disorder, unspecified: Secondary | ICD-10-CM | POA: Diagnosis present

## 2020-12-25 DIAGNOSIS — F32A Depression, unspecified: Secondary | ICD-10-CM | POA: Diagnosis present

## 2020-12-25 DIAGNOSIS — Z833 Family history of diabetes mellitus: Secondary | ICD-10-CM | POA: Diagnosis not present

## 2020-12-25 DIAGNOSIS — I6930 Unspecified sequelae of cerebral infarction: Secondary | ICD-10-CM | POA: Diagnosis not present

## 2020-12-25 DIAGNOSIS — Z20822 Contact with and (suspected) exposure to covid-19: Secondary | ICD-10-CM | POA: Diagnosis present

## 2020-12-25 DIAGNOSIS — N12 Tubulo-interstitial nephritis, not specified as acute or chronic: Secondary | ICD-10-CM | POA: Diagnosis not present

## 2020-12-25 DIAGNOSIS — N3090 Cystitis, unspecified without hematuria: Secondary | ICD-10-CM | POA: Diagnosis present

## 2020-12-25 DIAGNOSIS — A419 Sepsis, unspecified organism: Secondary | ICD-10-CM | POA: Diagnosis present

## 2020-12-25 DIAGNOSIS — Z888 Allergy status to other drugs, medicaments and biological substances status: Secondary | ICD-10-CM | POA: Diagnosis not present

## 2020-12-25 DIAGNOSIS — T83511A Infection and inflammatory reaction due to indwelling urethral catheter, initial encounter: Secondary | ICD-10-CM | POA: Diagnosis present

## 2020-12-25 DIAGNOSIS — R502 Drug induced fever: Secondary | ICD-10-CM | POA: Diagnosis not present

## 2020-12-25 DIAGNOSIS — Z79899 Other long term (current) drug therapy: Secondary | ICD-10-CM | POA: Diagnosis not present

## 2020-12-25 DIAGNOSIS — G8250 Quadriplegia, unspecified: Secondary | ICD-10-CM | POA: Diagnosis present

## 2020-12-25 DIAGNOSIS — I69898 Other sequelae of other cerebrovascular disease: Secondary | ICD-10-CM | POA: Diagnosis not present

## 2020-12-25 DIAGNOSIS — Y846 Urinary catheterization as the cause of abnormal reaction of the patient, or of later complication, without mention of misadventure at the time of the procedure: Secondary | ICD-10-CM | POA: Diagnosis present

## 2020-12-25 DIAGNOSIS — M4722 Other spondylosis with radiculopathy, cervical region: Secondary | ICD-10-CM | POA: Diagnosis present

## 2020-12-25 DIAGNOSIS — Z8249 Family history of ischemic heart disease and other diseases of the circulatory system: Secondary | ICD-10-CM | POA: Diagnosis not present

## 2020-12-25 DIAGNOSIS — Z882 Allergy status to sulfonamides status: Secondary | ICD-10-CM | POA: Diagnosis not present

## 2020-12-25 DIAGNOSIS — Z791 Long term (current) use of non-steroidal anti-inflammatories (NSAID): Secondary | ICD-10-CM | POA: Diagnosis not present

## 2020-12-25 DIAGNOSIS — M47812 Spondylosis without myelopathy or radiculopathy, cervical region: Secondary | ICD-10-CM | POA: Diagnosis present

## 2020-12-25 LAB — CBC WITH DIFFERENTIAL/PLATELET
Abs Immature Granulocytes: 0.03 10*3/uL (ref 0.00–0.07)
Basophils Absolute: 0.1 10*3/uL (ref 0.0–0.1)
Basophils Relative: 1 %
Eosinophils Absolute: 0.3 10*3/uL (ref 0.0–0.5)
Eosinophils Relative: 4 %
HCT: 32.5 % — ABNORMAL LOW (ref 36.0–46.0)
Hemoglobin: 10.6 g/dL — ABNORMAL LOW (ref 12.0–15.0)
Immature Granulocytes: 0 %
Lymphocytes Relative: 39 %
Lymphs Abs: 3.3 10*3/uL (ref 0.7–4.0)
MCH: 28 pg (ref 26.0–34.0)
MCHC: 32.6 g/dL (ref 30.0–36.0)
MCV: 85.8 fL (ref 80.0–100.0)
Monocytes Absolute: 0.9 10*3/uL (ref 0.1–1.0)
Monocytes Relative: 11 %
Neutro Abs: 3.8 10*3/uL (ref 1.7–7.7)
Neutrophils Relative %: 45 %
Platelets: 273 10*3/uL (ref 150–400)
RBC: 3.79 MIL/uL — ABNORMAL LOW (ref 3.87–5.11)
RDW: 15.1 % (ref 11.5–15.5)
WBC: 8.5 10*3/uL (ref 4.0–10.5)
nRBC: 0 % (ref 0.0–0.2)

## 2020-12-25 LAB — URINE CULTURE: Culture: 10000 — AB

## 2020-12-25 MED ORDER — IBUPROFEN 400 MG PO TABS
600.0000 mg | ORAL_TABLET | Freq: Once | ORAL | Status: AC
  Administered 2020-12-25: 22:00:00 600 mg via ORAL
  Filled 2020-12-25: qty 2

## 2020-12-25 MED ORDER — CEFAZOLIN SODIUM-DEXTROSE 1-4 GM/50ML-% IV SOLN
1.0000 g | Freq: Three times a day (TID) | INTRAVENOUS | Status: DC
  Administered 2020-12-25 – 2020-12-26 (×4): 1 g via INTRAVENOUS
  Filled 2020-12-25 (×7): qty 50

## 2020-12-25 NOTE — Progress Notes (Signed)
   12/25/20 1758  Assess: MEWS Score  Temp 99.1 F (37.3 C)  Assess: MEWS Score  MEWS Temp 0  MEWS Systolic 0  MEWS Pulse 0  MEWS RR 0  MEWS LOC 0  MEWS Score 0  MEWS Score Color Green  Assess: if the MEWS score is Yellow or Red  Were vital signs taken at a resting state? Yes  Focused Assessment Change from prior assessment (see assessment flowsheet)  MEWS guidelines implemented *See Row Information* No, vital signs rechecked  Treat  Pain Scale 0-10  Pain Score 0  Notify: Charge Nurse/RN  Name of Charge Nurse/RN Notified Malka RN  Date Charge Nurse/RN Notified 12/25/20  Time Charge Nurse/RN Notified 1800

## 2020-12-25 NOTE — Progress Notes (Addendum)
   12/25/20 0400  Vitals  Temp (!) 101.1 F (38.4 C)  Temp Source Oral  BP (!) 146/92  MAP (mmHg) 109  BP Location Left Arm  BP Method Automatic  Patient Position (if appropriate) Lying  Pulse Rate 93  Pulse Rate Source Monitor  Resp 18  Tylenol given for 101.1 temp. On recheck temp 101.2. Attending contacted regarding temp not decreasing with tylenol.

## 2020-12-25 NOTE — Plan of Care (Signed)
Pt rested during overnight. Pt continues to run low grade temps. This am 0400 temp 101.1 Tylenol given. No decrease in temp noted. Provider contacted. Pt compliant with meds. Last Bm 12/25/20 Problem: Clinical Measurements: Goal: Signs and symptoms of infection will decrease Outcome: Not Progressing   Problem: Education: Goal: Knowledge of General Education information will improve Description: Including pain rating scale, medication(s)/side effects and non-pharmacologic comfort measures 12/25/2020 0624 by Moishe Spice, RN Outcome: Progressing    Problem: Health Behavior/Discharge Planning: Goal: Ability to manage health-related needs will improve 12/25/2020 0624 by Moishe Spice, RN Outcome: Progressing  Problem: Clinical Measurements: Goal: Ability to maintain clinical measurements within normal limits will improve 12/25/2020 0624 by Moishe Spice, RN Outcome: Progressing  Goal: Will remain free from infection 12/25/2020 0624 by Moishe Spice, RN Outcome: Progressing  Goal: Diagnostic test results will improve 12/25/2020 0624 by Moishe Spice, RN Outcome: Progressing  Goal: Respiratory complications will improve 12/25/2020 0624 by Moishe Spice, RN Outcome: Progressing  Goal: Cardiovascular complication will be avoided 12/25/2020 3329 by Moishe Spice, RN Outcome: Progressing    Problem: Activity: Goal: Risk for activity intolerance will decrease 12/25/2020 0624 by Moishe Spice, RN    Problem: Nutrition: Goal: Adequate nutrition will be maintained 12/25/2020 0624 by Moishe Spice, RN Outcome: Progressing    Problem: Coping: Goal: Level of anxiety will decrease 12/25/2020 0624 by Moishe Spice, RN Outcome: Progressing    Problem: Elimination: Goal: Will not experience complications related to bowel motility 12/25/2020 0624 by Moishe Spice, RN Outcome: Progressing  Goal: Will not experience complications related to urinary  retention 12/25/2020 0624 by Moishe Spice, RN Outcome: Progressing  Problem: Pain Managment: Goal: General experience of comfort will improve 12/25/2020 0624 by Moishe Spice, RN Outcome: Progressing    Problem: Safety: Goal: Ability to remain free from injury will improve 12/25/2020 0624 by Moishe Spice, RN Outcome: Progressing  Problem: Skin Integrity: Goal: Risk for impaired skin integrity will decrease 12/25/2020 0624 by Moishe Spice, RN Outcome: Progressing

## 2020-12-25 NOTE — Progress Notes (Signed)
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  Reportedly febrile to 103 at nursing facility and started on Keflex for presumed UTI.  In ED, mild temp to 100.3.  Tachycardic to 107.  Tachypnea low 20s.  Leukocytosis to 11.6.  Hgb 10.9.  Otherwise, CBC and CMP without significant finding.  UA with moderate LE and rare bacteria.  CXR with mild cardiomegaly.  CT abdomen and pelvis w/o contrast with decompressed bladder but possible bladder wall thickening suggestive for cystitis.  Cultures obtained.  Patient was started on IV ceftriaxone.  Antibiotics escalated to IV cefepime on admission.  Urine culture with 10,000 colonies of Proteus Mirabilis.  Antibiotics de-escalated to IV Ancef  Subjective: Seen and examined earlier this morning.  She spiked fever to 101.2 F earlier this morning.  Otherwise no major issues overnight of this morning.  No complaint this morning.  She denies chest pain, dyspnea, cough or GI symptoms.  Objective: Vitals:   12/25/20 0400 12/25/20 0600 12/25/20 0759 12/25/20 1057  BP: (!) 146/92  (!) 156/94 122/76  Pulse: 93  95 87  Resp: 18  16 16   Temp: (!) 101.1 F (38.4 C) (!) 101.2 F (38.4 C) 98.2 F (36.8 C) (!) 101.3 F (38.5 C)  TempSrc: Oral Oral Oral   SpO2: 93%  91% 90%  Weight:      Height:        Intake/Output Summary (Last 24 hours) at 12/25/2020 1313 Last data filed at 12/25/2020 1100 Gross per 24 hour  Intake 1047.26 ml  Output 3350 ml  Net -2302.74 ml   Filed Weights   12/23/20 1610  Weight: 116.3 kg    Examination:  GENERAL: No  apparent distress.  Nontoxic. HEENT: MMM.  Vision and hearing grossly intact.  NECK: Supple.  No apparent JVD.  RESP:  No IWOB.  Fair aeration bilaterally. CVS:  RRR. Heart sounds normal.  ABD/GI/GU: BS+. Abd soft, NTND.  Indwelling Foley with clear yellow urine MSK/EXT: BLE diplegia.  Splint to right wrist for wrist drop.  SKIN: Clean looking stage I pressure ulcer of left heel.  No signs of infection.  Tinea pedis bilaterally NEURO: Awake, alert and oriented appropriately.  No apparent focal neuro deficit other than known BLE diplegia and right wrist drop. PSYCH: Calm. Normal affect.   Procedures:  None  Microbiology summarized: COVID-19 PCR negative. Blood cultures NGTD. Urine culture with 10,000 colonies Proteus mirabilis.  Assessment & Plan: Sepsis due to catheter associated UTI in patient with chronic indwelling Foley.  Recently started on Keflex outpatient.  UA with moderate LE and rare bacteria.  Culture data as above.  Continues to spike fever despite appropriate antibiotic.  She is on Cymbalta and trazodone which raises concern for serotonin syndrome.  -Foley replaced on admission. -De-escalate antibiotic to IV Ancef -Discontinue trazodone.  Possible tinea pedis -Terbinafine cream twice daily for 2 weeks   BLE diplegia/spinal cord injury/chronic pain -Continue home pain medications-Lyrica, tizanidine, Mobic and as needed oxy  History of hypotension:  On midodrine.  Normotensive. -Discontinue IV fluid and midodrine  Anxiety/depression: Stable.  Spiking fever despite appropriate antibiotics.  BP slightly  high. -Continue home Cymbalta -Discontinue trazodone out of concern for serotonin syndrome.  Neuropathy-continue Lyrica  History of CVA: -Continue statin and aspirin.  Morbid obesity Body mass index is 42.67 kg/m.         DVT prophylaxis:    Subcu Lovenox  Code Status: Full code Family Communication: Patient and/or RN. Available if any question.  Level  of care: Med-Surg Status is: Inpatient  Remains inpatient appropriate because:IV treatments appropriate due to intensity of illness or inability to take PO and Inpatient level of care appropriate due to severity of illness  Dispo: The patient is from:  LTF              Anticipated d/c is to:  LTF              Patient currently is not medically stable to d/c.   Difficult to place patient No            Consultants:  None   Sch Meds:  Scheduled Meds:  acamprosate  333 mg Oral TID   vitamin C  500 mg Oral BID   aspirin EC  325 mg Oral Daily   atorvastatin  10 mg Oral Daily   Chlorhexidine Gluconate Cloth  6 each Topical Daily   dicyclomine  10 mg Oral TID WC   DULoxetine  60 mg Oral Daily   enoxaparin (LOVENOX) injection  60 mg Subcutaneous Q24H   meloxicam  7.5 mg Oral Daily   multivitamin with minerals  1 tablet Oral Daily   pantoprazole  80 mg Oral Daily   polyethylene glycol  17 g Oral Daily   pregabalin  100 mg Oral BID   terbinafine   Topical BID   tiZANidine  2 mg Oral BID   Continuous Infusions:  sodium chloride 500 mL (12/24/20 1250)    ceFAZolin (ANCEF) IV     methocarbamol (ROBAXIN) IV     PRN Meds:.sodium chloride, acetaminophen **OR** acetaminophen, methocarbamol (ROBAXIN) IV, ondansetron **OR** ondansetron (ZOFRAN) IV, oxyCODONE  Antimicrobials: Anti-infectives (From admission, onward)    Start     Dose/Rate Route Frequency Ordered Stop   12/25/20 1400  ceFAZolin (ANCEF) IVPB 1 g/50 mL premix        1 g 100 mL/hr over 30 Minutes Intravenous Every 8 hours 12/25/20 1214     12/24/20 0500  ceFEPIme (MAXIPIME) 2 g in sodium chloride 0.9 % 100 mL IVPB  Status:  Discontinued        2 g 200 mL/hr over 30 Minutes Intravenous Every 12 hours 12/23/20 1504 12/23/20 1619   12/23/20 1700  ceFEPIme (MAXIPIME) 2 g in sodium chloride 0.9 % 100 mL IVPB  Status:  Discontinued        2 g 200 mL/hr over 30 Minutes Intravenous Every 8 hours 12/23/20 1619 12/25/20 1214    12/23/20 1600  ceFEPIme (MAXIPIME) 2 g in sodium chloride 0.9 % 100 mL IVPB  Status:  Discontinued        2 g 200 mL/hr over 30 Minutes Intravenous  Once 12/23/20 1501 12/23/20 1619   12/23/20 1045  cefTRIAXone (ROCEPHIN) 1 g in sodium chloride 0.9 % 100 mL IVPB        1 g 200 mL/hr over 30 Minutes Intravenous  Once 12/23/20 1035 12/23/20 1230        I have personally reviewed the following labs and images: CBC: Recent Labs  Lab 12/23/20 1025 12/24/20 0532 12/25/20 0719  WBC 11.6* 8.0 8.5  NEUTROABS 7.2  --  3.8  HGB 10.9* 10.6* 10.6*  HCT 32.7* 32.5* 32.5*  MCV 86.1 85.8 85.8  PLT 233 231 273   BMP &GFR Recent Labs  Lab 12/23/20 1025 12/24/20 0532  NA 135 140  K 4.1 4.1  CL 100 104  CO2 25 29  GLUCOSE 101* 77  BUN 14 14  CREATININE 0.72 0.72  CALCIUM 8.9 9.0   Estimated Creatinine Clearance: 103.6 mL/min (by C-G formula based on SCr of 0.72 mg/dL). Liver & Pancreas: Recent Labs  Lab 12/23/20 1025  AST 23  ALT 39  ALKPHOS 134*  BILITOT 0.9  PROT 7.0  ALBUMIN 3.0*   No results for input(s): LIPASE, AMYLASE in the last 168 hours. No results for input(s): AMMONIA in the last 168 hours. Diabetic: No results for input(s): HGBA1C in the last 72 hours. No results for input(s): GLUCAP in the last 168 hours. Cardiac Enzymes: No results for input(s): CKTOTAL, CKMB, CKMBINDEX, TROPONINI in the last 168 hours. No results for input(s): PROBNP in the last 8760 hours. Coagulation Profile: Recent Labs  Lab 12/23/20 1025  INR 1.2   Thyroid Function Tests: No results for input(s): TSH, T4TOTAL, FREET4, T3FREE, THYROIDAB in the last 72 hours. Lipid Profile: No results for input(s): CHOL, HDL, LDLCALC, TRIG, CHOLHDL, LDLDIRECT in the last 72 hours. Anemia Panel: No results for input(s): VITAMINB12, FOLATE, FERRITIN, TIBC, IRON, RETICCTPCT in the last 72 hours. Urine analysis:    Component Value Date/Time   COLORURINE STRAW (A) 12/23/2020 1026   APPEARANCEUR  CLEAR (A) 12/23/2020 1026   LABSPEC 1.003 (L) 12/23/2020 1026   PHURINE 7.0 12/23/2020 1026   GLUCOSEU NEGATIVE 12/23/2020 1026   HGBUR NEGATIVE 12/23/2020 1026   BILIRUBINUR NEGATIVE 12/23/2020 1026   KETONESUR NEGATIVE 12/23/2020 1026   PROTEINUR NEGATIVE 12/23/2020 1026   NITRITE NEGATIVE 12/23/2020 1026   LEUKOCYTESUR MODERATE (A) 12/23/2020 1026   Sepsis Labs: Invalid input(s): PROCALCITONIN, LACTICIDVEN  Microbiology: Recent Results (from the past 240 hour(s))  Culture, blood (Routine x 2)     Status: None (Preliminary result)   Collection Time: 12/23/20 10:25 AM   Specimen: BLOOD LEFT HAND  Result Value Ref Range Status   Specimen Description BLOOD LEFT HAND  Final   Special Requests   Final    BOTTLES DRAWN AEROBIC AND ANAEROBIC Blood Culture adequate volume   Culture   Final    NO GROWTH < 24 HOURS Performed at [facility], [address] [place], [place] [address]    Report Status PENDING  Incomplete  Culture, blood (Routine x 2)     Status: None (Preliminary result)   Collection Time: 12/23/20 10:30 AM   Specimen: Right Antecubital; Blood  Result Value Ref Range Status   Specimen Description RIGHT ANTECUBITAL  Final   Special Requests   Final    BOTTLES DRAWN AEROBIC AND ANAEROBIC Blood Culture adequate volume   Culture   Final    NO GROWTH < 24 HOURS Performed at [facility], [address] [place], [place] [address]    Report Status PENDING  Incomplete  Urine culture     Status: Abnormal   Collection Time: 12/23/20 10:36 AM   Specimen: Urine, Random  Result Value Ref Range Status   Specimen Description   Final    URINE, RANDOM Performed at [facility], [address] [place], [place] [address]    Special Requests   Final    NONE Performed at [facility], [address] [place], [place]  [address]    Culture 10,000 COLONIES/mL PROTEUS MIRABILIS (A)  Final   Report Status 12/25/2020 FINAL  Final   Organism  ID, Bacteria PROTEUS MIRABILIS (A)  Final      Susceptibility   Proteus mirabilis - MIC*    AMPICILLIN <=2 SENSITIVE Sensitive     CEFAZOLIN <=4 SENSITIVE Sensitive     CEFEPIME <=0.12 SENSITIVE Sensitive     CEFTRIAXONE <=0.25 SENSITIVE Sensitive     CIPROFLOXACIN <=0.25 SENSITIVE Sensitive     GENTAMICIN <=1 SENSITIVE Sensitive     IMIPENEM 1 SENSITIVE Sensitive     NITROFURANTOIN 128 RESISTANT Resistant     TRIMETH/SULFA <=20 SENSITIVE Sensitive     AMPICILLIN/SULBACTAM <=2 SENSITIVE Sensitive     PIP/TAZO <=4 SENSITIVE Sensitive     * 10,000 COLONIES/mL PROTEUS MIRABILIS  SARS CORONAVIRUS 2 (TAT 6-24 HRS) Nasopharyngeal Nasopharyngeal Swab     Status: None   Collection Time: 12/23/20  1:20 PM   Specimen: Nasopharyngeal Swab  Result Value Ref Range Status   SARS Coronavirus 2 NEGATIVE NEGATIVE Final    Comment: (NOTE) SARS-CoV-2 target nucleic acids are NOT DETECTED.  The SARS-CoV-2 RNA is generally detectable in upper and lower respiratory specimens during the acute phase of infection. Negative results do not preclude SARS-CoV-2 infection, do not rule out co-infections with other pathogens, and should not be used as the sole basis for treatment or other patient management decisions. Negative results must be combined with clinical observations, patient history, and epidemiological information. The expected result is Negative.  Fact Sheet for Patients: HairSlick.no  Fact Sheet for Healthcare Providers: quierodirigir.com  This test is not yet approved or cleared by the [place] FDA and  has been authorized for detection and/or diagnosis of SARS-CoV-2 by FDA under an Emergency Use Authorization (EUA). This EUA will remain  in effect (meaning this test can be used) for the duration of the COVID-19 declaration under Se ction 564(b)(1) of the Act, 21 U.S.C. section 360bbb-3(b)(1), unless the authorization is terminated  or revoked sooner.  Performed at [facility], [address], [place], [place] [address]     Radiology Studies: No results found.     Catcher Dehoyos T. Viliami Bracco [facility]  If 7PM-7AM, please contact night-coverage [email or web address] 12/25/2020, 1:13 PM

## 2020-12-26 ENCOUNTER — Inpatient Hospital Stay: Payer: Medicare Other

## 2020-12-26 DIAGNOSIS — T83511A Infection and inflammatory reaction due to indwelling urethral catheter, initial encounter: Secondary | ICD-10-CM | POA: Diagnosis not present

## 2020-12-26 DIAGNOSIS — R502 Drug induced fever: Secondary | ICD-10-CM | POA: Diagnosis not present

## 2020-12-26 DIAGNOSIS — N319 Neuromuscular dysfunction of bladder, unspecified: Secondary | ICD-10-CM | POA: Diagnosis not present

## 2020-12-26 DIAGNOSIS — A419 Sepsis, unspecified organism: Secondary | ICD-10-CM | POA: Diagnosis not present

## 2020-12-26 LAB — RENAL FUNCTION PANEL
Albumin: 2.7 g/dL — ABNORMAL LOW (ref 3.5–5.0)
Anion gap: 6 (ref 5–15)
BUN: 13 mg/dL (ref 6–20)
CO2: 32 mmol/L (ref 22–32)
Calcium: 9.1 mg/dL (ref 8.9–10.3)
Chloride: 97 mmol/L — ABNORMAL LOW (ref 98–111)
Creatinine, Ser: 0.79 mg/dL (ref 0.44–1.00)
GFR, Estimated: >60 mL/min (ref >60)
Glucose, Bld: 137 mg/dL — ABNORMAL HIGH (ref 70–99)
Phosphorus: 3.8 mg/dL (ref 2.5–4.6)
Potassium: 4.7 mmol/L (ref 3.5–5.1)
Sodium: 135 mmol/L (ref 135–145)

## 2020-12-26 LAB — RESPIRATORY PANEL BY PCR

## 2020-12-26 LAB — CBC
HCT: 33.7 % — ABNORMAL LOW (ref 36.0–46.0)
Hemoglobin: 11 g/dL — ABNORMAL LOW (ref 12.0–15.0)
MCH: 27.9 pg (ref 26.0–34.0)
MCHC: 32.6 g/dL (ref 30.0–36.0)
MCV: 85.5 fL (ref 80.0–100.0)
Platelets: 325 10*3/uL (ref 150–400)
RBC: 3.94 MIL/uL (ref 3.87–5.11)
RDW: 14.6 % (ref 11.5–15.5)
WBC: 8.5 10*3/uL (ref 4.0–10.5)
nRBC: 0 % (ref 0.0–0.2)

## 2020-12-26 LAB — D-DIMER, QUANTITATIVE: D-Dimer, Quant: 1.97 ug/mL-FEU — ABNORMAL HIGH (ref 0.00–0.50)

## 2020-12-26 LAB — MAGNESIUM: Magnesium: 1.9 mg/dL (ref 1.7–2.4)

## 2020-12-26 LAB — SEDIMENTATION RATE: Sed Rate: 115 mm/hr — ABNORMAL HIGH (ref 0–30)

## 2020-12-26 MED ORDER — SODIUM CHLORIDE 0.9 % IV SOLN
1.0000 g | Freq: Three times a day (TID) | INTRAVENOUS | Status: DC
  Administered 2020-12-26 – 2020-12-28 (×5): 1 g via INTRAVENOUS
  Filled 2020-12-26 (×3): qty 1
  Filled 2020-12-26 (×4): qty 10

## 2020-12-26 NOTE — Progress Notes (Signed)
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  Reportedly febrile to 103 at nursing facility and started on Keflex for presumed UTI.  In ED, mild temp to 100.3.  Tachycardic to 107.  Tachypnea low 20s.  Leukocytosis to 11.6.  Hgb 10.9.  Otherwise, CBC and CMP without significant finding.  UA with moderate LE and rare bacteria.  CXR with mild cardiomegaly.  CT abdomen and pelvis w/o contrast with decompressed bladder but possible bladder wall thickening suggestive for cystitis.  Cultures obtained.  Patient was started on IV ceftriaxone.  Antibiotics escalated to IV cefepime on admission.  Urine culture with 10,000 colonies of Proteus Mirabilis.  Antibiotics de-escalated to IV Ancef.  However, patient continues to spike fever despite appropriate antibiotics.  No other obvious source of infection or leukocytosis.  ESR elevated.  Subjective: Seen and examined earlier this morning.  She was febrile to 101.9 earlier this morning.  She was not symptomatic from this.  No complaint this morning.  She denies chest pain, shortness of breath or GI symptoms.  She has cough but nothing unusual.  She denies runny nose or sore throat. Objective: Vitals:   12/26/20 0333 12/26/20 0757 12/26/20 1031 12/26/20 1105  BP: 124/78 (!) 151/85  111/66  Pulse: 89 89  86  Resp: _0 Temp: (!) 100.8 F (38.2 C) (!) 100.8 F (38.2 C) 99.7 F (37.6 C) 99.3 F (37.4 C)  TempSrc:      SpO2: 93% 93%  93%  Weight:      Height:        Intake/Output Summary (Last 24 hours) at 12/26/2020 1254 Last data filed  at 12/26/2020 1007 Gross per 24 hour  Intake 326.75 ml  Output 2750 ml  Net -2423.25 ml   Filed Weights   12/23/20 1610  Weight: 116.3 kg    Examination:  GENERAL: No apparent distress.  Nontoxic. HEENT: MMM.  Vision and hearing grossly intact.  NECK: Supple.  No apparent JVD.  RESP: On RA.  No IWOB.  Fair aeration bilaterally but limited exam. CVS:  RRR. Heart sounds normal.  ABD/GI/GU: BS+. Abd soft, NTND.  MSK/EXT: BLE diplegia.  Splints on right wrist for wrist drop. SKIN: Clear looking the stage I pressure ulcer in her heels. Skin intact elsewhere. NEURO: Awake and alert. Oriented appropriately.  CN II-XII intact.  Right wrist drop.  Deep plegia in BLE. PSYCH: Calm. Normal affect.   Procedures:  None  Microbiology summarized: COVID-19 PCR negative. Blood cultures NGTD. Urine culture with 10,000 colonies Proteus mirabilis.  Assessment & Plan: Sepsis due to catheter associated UTI in patient with chronic indwelling Foley.  Recently started on Keflex outpatient.  UA with moderate LE and rare bacteria.  Culture data as above.  Continues to spike fever despite appropriate antibiotic.  -Foley replaced on admission. -De-escalate antibiotic to IV Ancef  Fever of unknown origin-patient continues to spike fever despite appropriate antibiotic.  CT A/P, CXR, blood cultures negative.  Urine culture as above.  ESR elevated. Trazodone discontinued out of concern for possible serotonin syndrome with Cymbalta.  Patient is basically asymptomatic and clinically well.  Has no leukocytosis either.  Autoimmune?  Autonomic dysregulation? -Check D-dimer given risk for VTE -RVP and CRP -Run by ID if she continues to spike fever.  Possible tinea pedis -Terbinafine cream twice daily for 2 weeks   BLE diplegia/spinal cord injury/chronic pain -Continue home pain medications-Lyrica, tizanidine, Mobic and as needed oxy  History of hypotension:  On midodrine.  Normotensive. -Discontinue IV fluid  and midodrine  Anxiety/depression: Stable.  Spiking fever despite appropriate antibiotics.  BP slightly high. -Continue home Cymbalta -Discontinue trazodone out of concern for serotonin syndrome.  Neuropathy-continue Lyrica  History of CVA: -Continue statin and aspirin.  Morbid obesity Body mass index is 42.67 kg/m.         DVT prophylaxis:    Subcu Lovenox  Code Status: Full code Family Communication: Updated patient's daughter over the phone on 6/12. Level of care: Med-Surg Status is: Inpatient  Remains inpatient appropriate because:Ongoing diagnostic testing needed not appropriate for outpatient work up, IV treatments appropriate due to intensity of illness or inability to take PO, and Inpatient level of care appropriate due to severity of illness  Dispo: The patient is from:  LTF              Anticipated d/c is to:  LTF              Patient currently is not medically stable to d/c.   Difficult to place patient No            Consultants:  None   Sch Meds:  Scheduled Meds:  acamprosate  333 mg Oral TID   vitamin C  500 mg Oral BID   aspirin EC  325 mg Oral Daily   atorvastatin  10 mg Oral Daily   Chlorhexidine Gluconate Cloth  6 each Topical Daily   dicyclomine  10 mg Oral TID WC   DULoxetine  60 mg Oral Daily   enoxaparin (LOVENOX) injection  60 mg Subcutaneous Q24H   meloxicam  7.5 mg Oral Daily   multivitamin with minerals  1 tablet Oral Daily   pantoprazole  80 mg Oral Daily   polyethylene glycol  17 g Oral Daily   pregabalin  100 mg Oral BID   terbinafine   Topical BID   tiZANidine  2 mg Oral BID   Continuous Infusions:  sodium chloride Stopped (12/24/20 2115)    ceFAZolin (ANCEF) IV 1 g (12/26/20 0508)   methocarbamol (ROBAXIN) IV     PRN Meds:.sodium chloride, acetaminophen **OR** acetaminophen, methocarbamol (ROBAXIN) IV, ondansetron **OR** ondansetron (ZOFRAN) IV, oxyCODONE  Antimicrobials: Anti-infectives (From admission, onward)     Start     Dose/Rate Route Frequency Ordered Stop   12/25/20 1400  ceFAZolin (ANCEF) IVPB 1 g/50 mL premix        1 g 100 mL/hr over 30 Minutes Intravenous Every 8 hours 12/25/20 1214     12/24/20 0500  ceFEPIme (MAXIPIME) 2 g in sodium chloride 0.9 % 100 mL IVPB  Status:  Discontinued        2 g 200 mL/hr over 30 Minutes Intravenous Every 12 hours 12/23/20 1504 12/23/20 1619   12/23/20 1700  ceFEPIme (MAXIPIME) 2 g in sodium chloride 0.9 % 100 mL IVPB  Status:  Discontinued        2 g 200 mL/hr over 30 Minutes Intravenous Every 8 hours 12/23/20 1619 12/25/20 1214   12/23/20 1600  ceFEPIme (MAXIPIME) 2 g in sodium chloride 0.9 % 100 mL IVPB  Status:  Discontinued  2 g 200 mL/hr over 30 Minutes Intravenous  Once 12/23/20 1501 12/23/20 1619   12/23/20 1045  cefTRIAXone (ROCEPHIN) 1 g in sodium chloride 0.9 % 100 mL IVPB        1 g 200 mL/hr over 30 Minutes Intravenous  Once 12/23/20 1035 12/23/20 1230        I have personally reviewed the following labs and images: CBC: Recent Labs  Lab 12/23/20 1025 12/24/20 0532 12/25/20 0719  WBC 11.6* 8.0 8.5  NEUTROABS 7.2  --  3.8  HGB 10.9* 10.6* 10.6*  HCT 32.7* 32.5* 32.5*  MCV 86.1 85.8 85.8  PLT 233 231 273   BMP &GFR Recent Labs  Lab 12/23/20 1025 12/24/20 0532  NA 135 140  K 4.1 4.1  CL 100 104  CO2 25 29  GLUCOSE 101* 77  BUN 14 14  CREATININE 0.72 0.72  CALCIUM 8.9 9.0   Estimated Creatinine Clearance: 103.6 mL/min (by C-G formula based on SCr of 0.72 mg/dL). Liver & Pancreas: Recent Labs  Lab 12/23/20 1025  AST 23  ALT 39  ALKPHOS 134*  BILITOT 0.9  PROT 7.0  ALBUMIN 3.0*   No results for input(s): LIPASE, AMYLASE in the last 168 hours. No results for input(s): AMMONIA in the last 168 hours. Diabetic: No results for input(s): HGBA1C in the last 72 hours. No results for input(s): GLUCAP in the last 168 hours. Cardiac Enzymes: No results for input(s): CKTOTAL, CKMB, CKMBINDEX, TROPONINI in the  last 168 hours. No results for input(s): PROBNP in the last 8760 hours. Coagulation Profile: Recent Labs  Lab 12/23/20 1025  INR 1.2   Thyroid Function Tests: No results for input(s): TSH, T4TOTAL, FREET4, T3FREE, THYROIDAB in the last 72 hours. Lipid Profile: No results for input(s): CHOL, HDL, LDLCALC, TRIG, CHOLHDL, LDLDIRECT in the last 72 hours. Anemia Panel: No results for input(s): VITAMINB12, FOLATE, FERRITIN, TIBC, IRON, RETICCTPCT in the last 72 hours. Urine analysis:    Component Value Date/Time   COLORURINE STRAW (A) 12/23/2020 1026   APPEARANCEUR CLEAR (A) 12/23/2020 1026   LABSPEC 1.003 (L) 12/23/2020 1026   PHURINE 7.0 12/23/2020 1026   GLUCOSEU NEGATIVE 12/23/2020 1026   HGBUR NEGATIVE 12/23/2020 1026   BILIRUBINUR NEGATIVE 12/23/2020 1026   KETONESUR NEGATIVE 12/23/2020 1026   PROTEINUR NEGATIVE 12/23/2020 1026   NITRITE NEGATIVE 12/23/2020 1026   LEUKOCYTESUR MODERATE (A) 12/23/2020 1026   Sepsis Labs: Invalid input(s): PROCALCITONIN, [place]  Microbiology: Recent Results (from the past 240 hour(s))  Culture, blood (Routine x 2)     Status: None (Preliminary result)   Collection Time: 12/23/20 10:25 AM   Specimen: BLOOD LEFT HAND  Result Value Ref Range Status   Specimen Description BLOOD LEFT HAND  Final   Special Requests   Final    BOTTLES DRAWN AEROBIC AND ANAEROBIC Blood Culture adequate volume   Culture   Final    NO GROWTH 3 DAYS Performed at [facility], [address], [place], [place] [address]    Report Status PENDING  Incomplete  Culture, blood (Routine x 2)     Status: None (Preliminary result)   Collection Time: 12/23/20 10:30 AM   Specimen: Right Antecubital; Blood  Result Value Ref Range Status   Specimen Description RIGHT ANTECUBITAL  Final   Special Requests   Final    BOTTLES DRAWN AEROBIC AND ANAEROBIC Blood Culture adequate volume   Culture   Final    NO GROWTH 3 DAYS Performed at [facility], [address] [place], [place] [address]    Report Status PENDING  Incomplete  Urine culture     Status: Abnormal   Collection Time: 12/23/20 10:36 AM   Specimen: Urine, Random  Result Value Ref Range Status   Specimen Description   Final    URINE, RANDOM Performed at [facility], [address] [place], [place] [address]    Special Requests   Final    NONE Performed at [facility], [place], [place] [address]    Culture 10,000 COLONIES/mL PROTEUS MIRABILIS (A)  Final   Report Status 12/25/2020 FINAL  Final   Organism ID, Bacteria PROTEUS MIRABILIS (A)  Final      Susceptibility   Proteus mirabilis - MIC*    AMPICILLIN <=2 SENSITIVE Sensitive     CEFAZOLIN <=4 SENSITIVE Sensitive     CEFEPIME <=0.12 SENSITIVE Sensitive     CEFTRIAXONE <=0.25 SENSITIVE Sensitive     CIPROFLOXACIN <=0.25 SENSITIVE Sensitive     GENTAMICIN <=1 SENSITIVE Sensitive     IMIPENEM 1 SENSITIVE Sensitive     NITROFURANTOIN 128 RESISTANT Resistant     TRIMETH/SULFA <=20 SENSITIVE Sensitive     AMPICILLIN/SULBACTAM <=2 SENSITIVE Sensitive     PIP/TAZO <=4 SENSITIVE Sensitive     * 10,000 COLONIES/mL PROTEUS MIRABILIS  SARS CORONAVIRUS 2 (TAT 6-24 HRS) Nasopharyngeal Nasopharyngeal Swab     Status: None   Collection Time: 12/23/20  1:20 PM   Specimen: Nasopharyngeal Swab  Result Value Ref Range Status   SARS Coronavirus 2 NEGATIVE NEGATIVE Final    Comment: (NOTE) SARS-CoV-2 target nucleic acids are NOT DETECTED.  The SARS-CoV-2 RNA is generally detectable in upper and lower respiratory specimens during the acute phase of infection. Negative results do not preclude SARS-CoV-2 infection, do not rule out co-infections with other pathogens, and should not be used as the sole basis for treatment or other patient management decisions. Negative results must be combined with clinical observations, patient history, and epidemiological information. The expected result is  Negative.  Fact Sheet for Patients: SugarRoll.be  Fact Sheet for Healthcare Providers: https://www.woods-mathews.com/  This test is not yet approved or cleared by the [place] FDA and  has been authorized for detection and/or diagnosis of SARS-CoV-2 by FDA under an Emergency Use Authorization (EUA). This EUA will remain  in effect (meaning this test can be used) for the duration of the COVID-19 declaration under Se ction 564(b)(1) of the Act, 21 U.S.C. section 360bbb-3(b)(1), unless the authorization is terminated or revoked sooner.  Performed at [facility], [place] [address] [place], [place] [address]     Radiology Studies: DG Chest Port 1 View  Result Date: 12/26/2020 CLINICAL DATA:  Fevers EXAM: PORTABLE CHEST 1 VIEW COMPARISON:  None. FINDINGS: The heart size and mediastinal contours are within normal limits. Both lungs are clear. The visualized skeletal structures show postsurgical changes in the cervical spine. IMPRESSION: No active disease. Electronically Signed   By: Inez Catalina M.D.   On: 12/26/2020 09:01       Taye T. Humptulips  If 7PM-7AM, please contact night-coverage [email or web address] 12/26/2020, 12:54 PM

## 2020-12-27 DIAGNOSIS — N3090 Cystitis, unspecified without hematuria: Secondary | ICD-10-CM

## 2020-12-27 DIAGNOSIS — G9511 Acute infarction of spinal cord (embolic) (nonembolic): Secondary | ICD-10-CM

## 2020-12-27 DIAGNOSIS — A419 Sepsis, unspecified organism: Secondary | ICD-10-CM | POA: Diagnosis not present

## 2020-12-27 DIAGNOSIS — T83511A Infection and inflammatory reaction due to indwelling urethral catheter, initial encounter: Secondary | ICD-10-CM | POA: Diagnosis not present

## 2020-12-27 DIAGNOSIS — R502 Drug induced fever: Secondary | ICD-10-CM | POA: Diagnosis not present

## 2020-12-27 DIAGNOSIS — R509 Fever, unspecified: Secondary | ICD-10-CM

## 2020-12-27 DIAGNOSIS — N319 Neuromuscular dysfunction of bladder, unspecified: Secondary | ICD-10-CM

## 2020-12-27 LAB — C-REACTIVE PROTEIN: CRP: 12.5 mg/dL — ABNORMAL HIGH (ref <1.0)

## 2020-12-27 MED ORDER — SODIUM CHLORIDE 0.9 % IV BOLUS
500.0000 mL | Freq: Once | INTRAVENOUS | Status: AC
  Administered 2020-12-27: 500 mL via INTRAVENOUS

## 2020-12-27 NOTE — TOC Initial Note (Signed)
Transition of Care [facility]) - Initial/Assessment Note    Patient Details  Name: Wendy Nelson MRN: [id number] Date of Birth: 1966-05-14  Transition [facility]) CM/SW Contact:    Allayne Butcher, RN Phone Number: 12/27/2020, 9:10 AM  Clinical Narrative:                 Patient admitted to the hospital with sepsis.  Patient is from [facility] where she is long term care but has been receiving therapy.  Plan for discharge is return to [facility].  PT and OT evals ordered so patient can continue doing therapy at [facility] when she returns.    Expected Discharge Plan: Skilled Nursing Facility Barriers to Discharge: Continued Medical Work up   Patient Goals and CMS Choice   CMS Medicare.gov Compare Post Acute Care list provided to:: Patient Choice offered to / list presented to : Patient  Expected Discharge Plan and Services Expected Discharge Plan: Skilled Nursing Facility   Discharge Planning Services: CM Consult Post Acute Care Choice: Skilled Nursing Facility Living arrangements for the past 2 months: Skilled Nursing Facility                 DME Arranged: N/A DME Agency: NA       HH Arranged: NA          Prior Living Arrangements/Services Living arrangements for the past 2 months: Skilled Nursing Facility Lives with:: Facility Resident Patient language and need for interpreter reviewed:: Yes Do you feel safe going back to the place where you live?: Yes      Need for Family Participation in Patient Care: Yes (Comment) Care giver support system in place?: Yes (comment) (children)   Criminal Activity/Legal Involvement Pertinent to Current Situation/Hospitalization: No - Comment as needed  Activities of Daily Living   ADL Screening (condition at time of admission) Patient's cognitive ability adequate to safely complete daily activities?: Yes Is the patient deaf or have difficulty hearing?: No Does the patient have difficulty seeing, even when wearing glasses/contacts?: No Does  the patient have difficulty concentrating, remembering, or making decisions?: No Patient able to express need for assistance with ADLs?: Yes Does the patient have difficulty dressing or bathing?: Yes Independently performs ADLs?: No Communication: Independent with device (comment) (touch pad call bell) Dressing (OT): Dependent Is this a change from baseline?:  (new in march) Grooming: Dependent Is this a change from baseline?:  (new in march) Feeding: Dependent Is this a change from baseline?:  (new in march) Bathing: Dependent Toileting: Dependent In/Out Bed: Dependent Walks in Home: Dependent Does the patient have difficulty walking or climbing stairs?: Yes Weakness of Legs: Both Weakness of Arms/Hands: Both  Permission Sought/Granted Permission sought to share information with : Case Manager, Family Supports, Oceanographer granted to share information with : Yes, Verbal Permission Granted  Share Information with NAME: LaDonna  Permission granted to share info w AGENCY: [facility]  Permission granted to share info w Relationship: daughter     Emotional Assessment       Orientation: : Oriented to Self, Oriented to Place, Oriented to  Time, Oriented to Situation Alcohol / Substance Use: Not Applicable Psych Involvement: No (comment)  Admission diagnosis:  Sepsis [facility]) [A41.9] Patient Active Problem List   Diagnosis Date Noted   Sepsis (HCC) 12/23/2020   Neurogenic bladder    Syncope 10/19/2020   AKI (acute kidney injury) (HCC) 10/19/2020   Bilateral hydronephrosis 10/19/2020   HTN (hypertension) 10/19/2020   HLD (hyperlipidemia) 10/19/2020  Depression with anxiety 10/19/2020   Hypotension 09/2020   Cervical spondylosis with myelopathy and radiculopathy 09/2020   Spinal cord infarction [facility]) 09/2020   PCP:  [facility]:   [id number] [place], [place] - [address] AT [facility] [address] [place] [address] Phone: [phone] Fax: [phone]     Social Determinants of Health (SDOH) Interventions    Readmission Risk Interventions No flowsheet data found.

## 2020-12-27 NOTE — Evaluation (Signed)
Physical Therapy Evaluation Patient Details Name: Wendy Nelson MRN: [id number] DOB: [birth year] Today's Date: 12/27/2020   History of Present Illness  Pt is a 55 y.o. female presenting to hospital 6/10 from nursing facility with concerns for possible sepsis with some fevers and nausea today.  Pt admitted with sepsis (likely d/t urinary souce).  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  Clinical Impression  Prior to hospital admission, pt was at [facility] and pt reports receiving therapy 5 days a week and was working on strengthening, bed mobility, and activities in sitting.  Currently pt is max to total assist with logrolling in bed.  Pt initially reported she could not move R UE but then R elbow flexion noted multiple times during session; L UE weakness also noted but was stronger than R UE.  No active movement noted B LE's although possible reflexive movement noted with L hip/knee flexion ROM.  Minimal hip/knee flexion PROM noted R LE (possibly limited d/t tone).  Deferred sitting edge of bed d/t questions regarding pt's need for soft cervical collar use (pt reports using it for some activities but not others).  Therapist contacted facility after session and per facility, cervical soft collar was discontinued but pt wears it for comfort only (pt does not have soft cervical collar at hospital--currently at facility).  Pt oriented to person, place, month, year, and general situation but pt noted with some confusion during session and also intermittent delayed responses noted requiring adjustment to questions/cueing.  Pt would benefit from skilled PT to address noted impairments and functional limitations (see below for any additional details).   Upon hospital discharge, pt would benefit from continued physical therapy at facility.    Follow Up Recommendations SNF    Equipment Recommendations  Other (comment) (pt has needed DME at facility already)    Recommendations for Other Services       Precautions / Restrictions Precautions Precautions: Fall Precaution Comments: Residual CVA deficits; chronic indwelling catheter Required Braces or Orthoses: Other Brace Other Brace: Cervical soft collar for comfort only (per facility soft cervical collar was discontinued) Restrictions Weight Bearing Restrictions: No      Mobility  Bed Mobility Overal bed mobility: Needs Assistance Bed Mobility: Rolling Rolling: Max assist;Total assist         General bed mobility comments: pt able to reach towards R (with L UE) to assist with logrolling to R side; pt able to reach towards L (with R UE) with min assist to assist with logrolling towards L side; vc's for technique; max to total assist for logrolling    Transfers                 General transfer comment: Deferred (pt has been sitting edge of bed with 2 assist)  Ambulation/Gait                Stairs            Wheelchair Mobility    Modified Rankin (Stroke Patients Only)       Balance                                             Pertinent Vitals/Pain  Pain Assessment: No/denies pain Pain Intervention(s): Limited activity within patient's tolerance;Monitored during session;Repositioned HR 114 bpm at rest beginning of session and 104 bpm end of session at rest; O2 sats WFL on room air during session.    Home Living Family/patient expects to be discharged to:: Skilled nursing facility                 Additional Comments: [facility]    Prior Function Level of Independence: Needs assistance   Gait / Transfers Assistance Needed: Pt has been transferring to chair via hoyer lift at facility; requires 2 assist with bed mobility  and sitting edge of bed (pt reports able to maintain balance once sitting edge of bed).     Comments: Per chart pt was ambulatory prior to surgery March 2022.  Pt reports currently working with therapy 5 days/week (M-F).     Hand Dominance   Dominant Hand: Right    Extremity/Trunk Assessment   Upper Extremity Assessment Upper Extremity Assessment: Defer to OT evaluation RUE Deficits / Details: per OT eval "trace movement in wrist and digits - brace donned"--pt did not move wrist/fingers for physical therapist; pt noted with 3-/5 R elbow flexion (delayed muscle initiation noted)    Lower Extremity Assessment Lower Extremity Assessment:  (no movement noted B LE's with cueing although possible reflexive movement noted with L hip/knee flexion ROM; minimal R hip/knee flexion ROM noted (possible tone limiting movement?); L knee flexion to grossly 70-80 degrees flexion) RLE Sensation: decreased light touch;decreased proprioception LLE Sensation: decreased light touch;decreased proprioception    Cervical / Trunk Assessment Cervical / Trunk Assessment: Normal  Communication   Communication: Other (comment) (Delayed responses noted intermittently)  Cognition Arousal/Alertness: Awake/alert Behavior During Therapy: Flat affect                                   General Comments: Oriented to person, month, year, place, and general situation.  Pt requiring increased time to process information intermittently requiring 2 choices to help answer questions.      General Comments General comments (skin integrity, edema, etc.): R UE brace in place.  Therapist called pt's facility to determine need for soft cervical collar use; therapist was able to talk to pt's nurse at facility (pt's nurse contacted Angie--therapy manager to assist with soft cervical collar question) and nurse relayed information from therapy manager that cervical soft collar was discontinued (used just for comfort  now).    Exercises     Assessment/Plan    PT Assessment Patient needs continued PT services  PT Problem List Decreased strength;Decreased activity tolerance;Decreased balance;Decreased mobility;Decreased range of motion;Decreased cognition;Decreased knowledge of use of DME;Decreased knowledge of precautions       PT Treatment Interventions DME instruction;Functional mobility training;Therapeutic activities;Therapeutic exercise;Balance training;Neuromuscular re-education;Cognitive remediation;Patient/family education    PT Goals (Current goals can be found in the Care Plan section)  Acute Rehab PT Goals Patient Stated Goal: to improve strength and mobility PT Goal Formulation: With patient/family Time For Goal Achievement: 01/10/21 Potential to Achieve Goals: Fair    Frequency Min 2X/week   Barriers to discharge        Co-evaluation               AM-PAC PT "6 Clicks" Mobility  Outcome Measure Help needed turning from your back to your side while in a flat bed without using bedrails?: Total Help needed moving from lying on your  back to sitting on the side of a flat bed without using bedrails?: Total Help needed moving to and from a bed to a chair (including a wheelchair)?: Total Help needed standing up from a chair using your arms (e.g., wheelchair or bedside chair)?: Total Help needed to walk in hospital room?: Total Help needed climbing 3-5 steps with a railing? : Total 6 Click Score: 6    End of Session   Activity Tolerance: Patient tolerated treatment well Patient left: in bed;with call bell/phone within reach;with bed alarm set;with nursing/sitter in room;with family/visitor present;Other (comment) (B prevalon boots donned; pt positioned/rolled towards L side (was positioned/rolled towards R side beginning of session)) Nurse Communication: Precautions;Mobility status PT Visit Diagnosis: Other abnormalities of gait and mobility (R26.89);Muscle weakness  (generalized) (M62.81)    Time: 9767-3419 PT Time Calculation (min) (ACUTE ONLY): 45 min   Charges:   PT Evaluation $PT Eval Low Complexity: 1 Low PT Treatments $Therapeutic Activity: 8-22 mins       Hendricks Limes, PT 12/27/20, 4:21 PM

## 2020-12-27 NOTE — Evaluation (Signed)
Occupational Therapy Evaluation Patient Details Name: Wendy Nelson MRN: [id number] DOB: October 08, 1965 Today's Date: 12/27/2020    History of Present Illness 55 y.o. 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   Clinical Impression   Patient presenting with decreased I in self care, balance, functional mobility/transfers, and endurance.Patient reports living at LTC facility and needing assistance from bed level for self care tasks from staff. She endorses use of hoyer lift to transfer to chair during the day. She has wrist brace donned on R UE this session and needing increased time to process information. She is able to utilize L UE for self care tasks but needing mod multimodal cuing to initiate and sequence tasks.  Patient currently functioning at total A for bed mobility, max A for UB self care, and total A (+2) for LB self care. Pt is pleasant and cooperative throughout and reports therapy staff have been working with her PTA for strengthening and EOB tasks.  Patient will benefit from acute OT to increase overall independence in the areas of ADLs, functional mobility, safety awareness in order to safely discharge to next venue of care.    Follow Up Recommendations  SNF;Supervision/Assistance - 24 hour    Equipment Recommendations  Other (comment) (defer to next venue of care)       Precautions / Restrictions Precautions Precautions: Fall Precaution Comments: residual CVA deficits      Mobility Bed Mobility Overal bed mobility: Needs Assistance Bed Mobility: Rolling Rolling: Total assist         General bed mobility comments: Pt able to follow commands to reach with L UE to bed rail and total A needed to roll    Transfers                 General transfer comment: deferred  for safety        ADL either performed or assessed with clinical judgement   ADL Overall ADL's : Needs assistance/impaired                                       General ADL Comments: max A for UB self care with pt able to utilize L UE to assist. Total A for LB self care and pt reports no sensation in B LEs. She is unable to move B LEs without total A. Total A for rolling.     Vision Patient Visual Report: No change from baseline       Perception     Praxis      Pertinent Vitals/Pain Pain Assessment: Faces Faces Pain Scale: Hurts a little bit Pain Location: R UE Pain Descriptors / Indicators: Discomfort Pain Intervention(s): Limited activity within patient's tolerance;Monitored during session;Repositioned     Hand Dominance Right   Extremity/Trunk Assessment Upper Extremity Assessment Upper Extremity Assessment: RUE deficits/detail;Generalized weakness RUE Deficits / Details: trace movement in wrist and digits - brace donned.           Communication Communication Communication: No difficulties   Cognition Arousal/Alertness: Awake/alert Behavior During Therapy: Flat affect Overall Cognitive Status: History of cognitive impairments - at baseline  General Comments: Pt very slow to process information and at times needing choice of 2 to correctly answer questions.              Home Living Family/patient expects to be discharged to:: Skilled nursing facility                                        Prior Functioning/Environment          Comments: Pt reports staff transfer her via hoyer to chair and she requires +2 assistance for self care and bed mobility. She has been seeing therapy PTA.        OT Problem List: Decreased strength;Decreased activity tolerance;Cardiopulmonary status limiting activity;Impaired balance (sitting and/or standing);Decreased safety awareness;Impaired UE  functional use      OT Treatment/Interventions: Self-care/ADL training;Manual therapy;Therapeutic exercise;Modalities;Patient/family education;Neuromuscular education;Balance training;Energy conservation;Therapeutic activities;Cognitive remediation/compensation    OT Goals(Current goals can be found in the care plan section) Acute Rehab OT Goals Patient Stated Goal: none stated OT Goal Formulation: Patient unable to participate in goal setting Time For Goal Achievement: 01/10/21 Potential to Achieve Goals: Good  OT Frequency: Min 2X/week   Barriers to D/C:    none known at this time          AM-PAC OT "6 Clicks" Daily Activity     Outcome Measure Help from another person eating meals?: A Little Help from another person taking care of personal grooming?: A Little Help from another person toileting, which includes using toliet, bedpan, or urinal?: Total Help from another person bathing (including washing, rinsing, drying)?: Total Help from another person to put on and taking off regular upper body clothing?: A Lot Help from another person to put on and taking off regular lower body clothing?: Total 6 Click Score: 11   End of Session Nurse Communication: Mobility status  Activity Tolerance: Patient tolerated treatment well Patient left: in bed;with call bell/phone within reach;with bed alarm set  OT Visit Diagnosis: Muscle weakness (generalized) (M62.81)                Time: 5366-4403 OT Time Calculation (min): 25 min Charges:  OT General Charges $OT Visit: 1 Visit OT Evaluation $OT Eval High Complexity: 1 High OT Treatments $Self Care/Home Management : 8-22 mins Jackquline Denmark, MS, OTR/L , CBIS ascom [phone]  12/27/20, 12:41 PM

## 2020-12-27 NOTE — Plan of Care (Signed)
Updated patient's daughter at bedside.

## 2020-12-27 NOTE — Progress Notes (Signed)
Patient states that she has had a cough for the last month and a half. Made Infection MD aware.

## 2020-12-27 NOTE — Consult Note (Signed)
NAME: Wendy Nelson  DOB: [birth year]  MRN: [id number]  Date/Time: 12/27/2020 3:10 PM  REQUESTING PROVIDER: Georges Lynch Subjective:  REASON FOR CONSULT: fever ? Wendy Nelson is a 55 y.o. 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 [facility] She has neurogenic bladder and has a foley cathere- she was in [facility] in April 2022 with acute urinary retention anda foley has been placed since then. In the ED Tmax 100.3, BP 104/78, HR 107, RR 23 and sats 94% WBC 11.6, HB 10.9, PLT 233. Blood culture and urine culture sent- was initally started on cefepime until 12/25/20 when it wa sswitched to cefazolin as proteus susceptible to cefazolin. Fever persist and increasing since 6/12 when was still on cefepime.pt doe snot feel the fever, she as no headache, sweating, chills , nausea, vomiting Has some cough for a few weeks but not productive       Past Surgical History:  Procedure Laterality Date   CERVICAL SPINE SURGERY     Rt Knee replacement  Social History   Socioeconomic History   Marital status: Single    Spouse name: Not on file   Number of children: Not on file   Years of education: Not on file   Highest education level: Not on file  Occupational History   Not on file  Tobacco Use   Smoking status: Never   Smokeless tobacco: Never  Substance and Sexual Activity   Alcohol use: Not on file   Drug use: Not on file   Sexual activity: Not Currently  Other Topics Concern   Not on file  Social History Narrative   Not on file   Social Determinants of Health   Financial Resource Strain: Not on file  Food Insecurity: Not on file  Transportation Needs: Not on file   Physical Activity: Not on file  Stress: Not on file  Social Connections: Not on file  Intimate Partner Violence: Not on file    Family History  Problem Relation Age of Onset   Diabetes Mellitus II Mother    Hypertension Mother    Diabetes Mellitus II Brother    Allergies  Allergen Reactions   Melatonin    Neomycin Other (See Comments)    Positive patch test   Sulfa Antibiotics    I? Current Facility-Administered Medications  Medication Dose Route Frequency Provider Last Rate Last Admin   0.9 %  sodium chloride infusion   Intravenous PRN Clarnce Flock, MD 10 mL/hr at 12/26/20 1505 500 mL at 12/26/20 1505   acamprosate (CAMPRAL) tablet 333 mg  333 mg Oral TID Clarnce Flock, MD   333 mg at 12/27/20 1219   acetaminophen (TYLENOL) tablet 650 mg  650 mg Oral Q6H PRN Clarnce Flock, MD   650 mg at 12/27/20 7588   Or   acetaminophen (TYLENOL) suppository 650 mg  650 mg Rectal Q6H PRN Clarnce Flock, MD       ascorbic acid (VITAMIN C) tablet 500 mg  500 mg Oral BID Clarnce Flock, MD   500 mg at 12/27/20 0930   aspirin EC tablet 325 mg  325 mg Oral Daily Clarnce Flock, MD   325 mg at  12/27/20 0929   atorvastatin (LIPITOR) tablet 10 mg  10 mg Oral Daily Clarnce Flock, MD   10 mg at 12/27/20 0930   ceFAZolin (ANCEF) 1 g in sodium chloride 0.9 % 100 mL IVPB  1 g Intravenous Q8H Dorothe Pea, RPH   1 g at 12/27/20 1453   Chlorhexidine Gluconate Cloth 2 % PADS 6 each  6 each Topical Daily Clarnce Flock, MD   6 each at 12/27/20 0931   dicyclomine (BENTYL) capsule 10 mg  10 mg Oral TID WC Clarnce Flock, MD   10 mg at 12/27/20 1208   DULoxetine (CYMBALTA) DR capsule 60 mg  60 mg Oral Daily Clarnce Flock, MD   60 mg at 12/27/20 0928   enoxaparin (LOVENOX) injection 60 mg  60 mg Subcutaneous Q24H Clarnce Flock, MD   60 mg at 12/26/20 2211   meloxicam (MOBIC) tablet 7.5 mg  7.5 mg Oral Daily Clarnce Flock, MD   7.5 mg at 12/27/20 2620    methocarbamol (ROBAXIN) 500 mg in dextrose 5 % 50 mL IVPB  500 mg Intravenous Q6H PRN Clarnce Flock, MD       multivitamin with minerals tablet 1 tablet  1 tablet Oral Daily Clarnce Flock, MD   1 tablet at 12/27/20 0927   ondansetron (ZOFRAN) tablet 4 mg  4 mg Oral Q6H PRN Clarnce Flock, MD   4 mg at 12/25/20 0900   Or   ondansetron (ZOFRAN) injection 4 mg  4 mg Intravenous Q6H PRN Clarnce Flock, MD   4 mg at 12/24/20 1711   oxyCODONE (Oxy IR/ROXICODONE) immediate release tablet 5 mg  5 mg Oral Q4H PRN Clarnce Flock, MD   5 mg at 12/24/20 0132   pantoprazole (PROTONIX) EC tablet 80 mg  80 mg Oral Daily Clarnce Flock, MD   80 mg at 12/27/20 0928   polyethylene glycol (MIRALAX / GLYCOLAX) packet 17 g  17 g Oral Daily Clarnce Flock, MD   17 g at 12/27/20 0928   pregabalin (LYRICA) capsule 100 mg  100 mg Oral BID Clarnce Flock, MD   100 mg at 12/27/20 3559   terbinafine (LAMISIL) 1 % cream   Topical BID Mercy Riding, MD   Given at 12/27/20 7416   tiZANidine (ZANAFLEX) tablet 2 mg  2 mg Oral BID Clarnce Flock, MD   2 mg at 12/27/20 3845     Abtx:  Anti-infectives (From admission, onward)    Start     Dose/Rate Route Frequency Ordered Stop   12/26/20 2200  ceFAZolin (ANCEF) 1 g in sodium chloride 0.9 % 100 mL IVPB        1 g 200 mL/hr over 30 Minutes Intravenous Every 8 hours 12/26/20 1550     12/25/20 1400  ceFAZolin (ANCEF) IVPB 1 g/50 mL premix  Status:  Discontinued        1 g 100 mL/hr over 30 Minutes Intravenous Every 8 hours 12/25/20 1214 12/26/20 1550   12/24/20 0500  ceFEPIme (MAXIPIME) 2 g in sodium chloride 0.9 % 100 mL IVPB  Status:  Discontinued        2 g 200 mL/hr over 30 Minutes Intravenous Every 12 hours 12/23/20 1504 12/23/20 1619   12/23/20 1700  ceFEPIme (MAXIPIME) 2 g in sodium chloride 0.9 % 100 mL IVPB  Status:  Discontinued        2 g 200 mL/hr over 30 Minutes  Intravenous Every 8 hours 12/23/20 1619 12/25/20 1214   12/23/20  1600  ceFEPIme (MAXIPIME) 2 g in sodium chloride 0.9 % 100 mL IVPB  Status:  Discontinued        2 g 200 mL/hr over 30 Minutes Intravenous  Once 12/23/20 1501 12/23/20 1619   12/23/20 1045  cefTRIAXone (ROCEPHIN) 1 g in sodium chloride 0.9 % 100 mL IVPB        1 g 200 mL/hr over 30 Minutes Intravenous  Once 12/23/20 1035 12/23/20 1230       REVIEW OF SYSTEMS:  Const: negative fever, negative chills, negative weight loss Eyes: negative diplopia or visual changes, negative eye pain ENT: negative coryza, negative sore throat Resp: +cough, no hemoptysis, dyspnea Cards: negative for chest pain, palpitations, lower extremity edema GU: negative for frequency, dysuria and hematuria GI: Negative for abdominal pain, diarrhea, bleeding, constipation Skin: negative for rash and pruritus Heme: negative for easy bruising and gum/nose bleeding MS: weakness Neurolo:paralysis of both lower extremities Paresis of upper extremities- able to use left arm  Psych: negative for feelings of anxiety, depression  Endocrine: negative for thyroid, diabetes Allergy/Immunology- as above Objective:  VITALS:   Patient Vitals for the past 24 hrs:  BP Temp Temp src Pulse Resp SpO2  12/27/20 1345 97/78 99.6 F (37.6 C) Oral -- 16 94 %  12/27/20 1140 95/70 98 F (36.7 C) -- (!) 114 16 100 %  12/27/20 0700 118/79 98.1 F (36.7 C) Oral 95 18 98 %  12/27/20 0338 122/84 98.2 F (36.8 C) Oral 97 20 96 %  12/27/20 0225 -- 100.2 F (37.9 C) Oral -- -- --  12/27/20 0054 (!) 93/58 (!) 102.7 F (39.3 C) Oral 90 18 98 %  12/26/20 1948 117/82 99.8 F (37.7 C) Oral 86 20 95 %  12/26/20 1650 104/68 (!) 100.7 F (38.2 C) Oral 89 20 97 %      PHYSICAL EXAM:  General: Alert, cooperative, no distress, appears stated age.  Head: Normocephalic, without obvious abnormality, atraumatic. Eyes: Conjunctivae clear, anicteric sclerae. Pupils are equal ENT Nares normal. No drainage or sinus tenderness. Lips, mucosa, and  tongue normal. No Thrush Neck: Scervical spine scar healed well- no tenderness supple, symmetrical, no adenopathy, thyroid: non tender no carotid bruit and no JVD. Back: No CVA tenderness. Lungs: Clear to auscultation bilaterally. No Wheezing or Rhonchi. No rales. Heart: Regular rate and rhythm, no murmur, rub or gallop. Abdomen: Soft, non-tender,not distended. Bowel sounds normal. No masses Extremities: atraumatic, no cyanosis. No edema. No clubbing Skin: No rashes or lesions. Or bruising Interdigital cleft has scaling of skin Lymph: Cervical, supraclavicular normal. Neurologic: o/5 power lower extremity- 3/5 upper extremities left > rt Pertinent Labs Lab Results CBC    Component Value Date/Time   WBC 8.5 12/26/2020 1541   RBC 3.94 12/26/2020 1541   HGB 11.0 (L) 12/26/2020 1541   HCT 33.7 (L) 12/26/2020 1541   PLT 325 12/26/2020 1541   MCV 85.5 12/26/2020 1541   MCH 27.9 12/26/2020 1541   MCHC 32.6 12/26/2020 1541   RDW 14.6 12/26/2020 1541   LYMPHSABS 3.3 12/25/2020 0719   MONOABS 0.9 12/25/2020 0719   EOSABS 0.3 12/25/2020 0719   BASOSABS 0.1 12/25/2020 0719    CMP Latest Ref Rng & Units 12/26/2020 12/24/2020 12/23/2020  Glucose 70 - 99 mg/dL 137(H) 77 101(H)  BUN 6 - 20 mg/dL _0 Creatinine 0.44 - 1.00 mg/dL 0.79 0.72 0.72  Sodium 135 - 145 mmol/L 135  140 135  Potassium 3.5 - 5.1 mmol/L 4.7 4.1 4.1  Chloride 98 - 111 mmol/L 97(L) 104 100  CO2 22 - 32 mmol/L 32 29 25  Calcium 8.9 - 10.3 mg/dL 9.1 9.0 8.9  Total Protein 6.5 - 8.1 g/dL - - 7.0  Total Bilirubin 0.3 - 1.2 mg/dL - - 0.9  Alkaline Phos 38 - 126 U/L - - 134(H)  AST 15 - 41 U/L - - 23  ALT 0 - 44 U/L - - 39      Microbiology: Recent Results (from the past 240 hour(s))  Culture, blood (Routine x 2)     Status: None (Preliminary result)   Collection Time: 12/23/20 10:25 AM   Specimen: BLOOD LEFT HAND  Result Value Ref Range Status   Specimen Description BLOOD LEFT HAND  Final   Special Requests    Final    BOTTLES DRAWN AEROBIC AND ANAEROBIC Blood Culture adequate volume   Culture   Final    NO GROWTH 4 DAYS Performed at [facility], [address], [place], [place] [address]    Report Status PENDING  Incomplete  Culture, blood (Routine x 2)     Status: None (Preliminary result)   Collection Time: 12/23/20 10:30 AM   Specimen: Right Antecubital; Blood  Result Value Ref Range Status   Specimen Description RIGHT ANTECUBITAL  Final   Special Requests   Final    BOTTLES DRAWN AEROBIC AND ANAEROBIC Blood Culture adequate volume   Culture   Final    NO GROWTH 4 DAYS Performed at [facility], [address] [place], [place] [address]    Report Status PENDING  Incomplete  Urine culture     Status: Abnormal   Collection Time: 12/23/20 10:36 AM   Specimen: Urine, Random  Result Value Ref Range Status   Specimen Description   Final    URINE, RANDOM Performed at [facility], [address] [place], [place] [address]    Special Requests   Final    NONE Performed at [facility], [address] [place], [place] [address]    Culture 10,000 COLONIES/mL PROTEUS MIRABILIS (A)  Final   Report Status 12/25/2020 FINAL  Final   Organism ID, Bacteria PROTEUS MIRABILIS (A)  Final      Susceptibility   Proteus mirabilis - MIC*    AMPICILLIN <=2 SENSITIVE Sensitive     CEFAZOLIN <=4 SENSITIVE Sensitive     CEFEPIME <=0.12 SENSITIVE Sensitive     CEFTRIAXONE <=0.25 SENSITIVE Sensitive     CIPROFLOXACIN <=0.25 SENSITIVE Sensitive     GENTAMICIN <=1 SENSITIVE Sensitive     IMIPENEM 1 SENSITIVE Sensitive     NITROFURANTOIN 128 RESISTANT Resistant     TRIMETH/SULFA <=20 SENSITIVE Sensitive     AMPICILLIN/SULBACTAM <=2 SENSITIVE Sensitive     PIP/TAZO <=4 SENSITIVE Sensitive     * 10,000 COLONIES/mL PROTEUS MIRABILIS  SARS CORONAVIRUS 2 (TAT 6-24 HRS) Nasopharyngeal Nasopharyngeal Swab     Status: None   Collection Time: 12/23/20  1:20 PM    Specimen: Nasopharyngeal Swab  Result Value Ref Range Status   SARS Coronavirus 2 NEGATIVE NEGATIVE Final    Comment: (NOTE) SARS-CoV-2 target nucleic acids are NOT DETECTED.  The SARS-CoV-2 RNA is generally detectable in upper and lower respiratory specimens during the acute phase of infection. Negative results do not preclude SARS-CoV-2 infection, do not rule out co-infections with other pathogens, and should not be used as the sole basis for treatment  or other patient management decisions. Negative results must be combined with clinical observations, patient history, and epidemiological information. The expected result is Negative.  Fact Sheet for Patients: SugarRoll.be  Fact Sheet for Healthcare Providers: https://www.woods-mathews.com/  This test is not yet approved or cleared by the [place] FDA and  has been authorized for detection and/or diagnosis of SARS-CoV-2 by FDA under an Emergency Use Authorization (EUA). This EUA will remain  in effect (meaning this test can be used) for the duration of the COVID-19 declaration under Se ction 564(b)(1) of the Act, 21 U.S.C. section 360bbb-3(b)(1), unless the authorization is terminated or revoked sooner.  Performed at [facility], [place] [address], [place], [place] [address]   Respiratory (~20 pathogens) panel by PCR     Status: None   Collection Time: 12/26/20 10:17 AM   Specimen: Nasopharyngeal Swab; Respiratory  Result Value Ref Range Status   Adenovirus NOT DETECTED NOT DETECTED Final   Coronavirus 229E NOT DETECTED NOT DETECTED Final    Comment: (NOTE) The Coronavirus on the Respiratory Panel, DOES NOT test for the novel  Coronavirus (2019 nCoV)    Coronavirus HKU1 NOT DETECTED NOT DETECTED Final   Coronavirus NL63 NOT DETECTED NOT DETECTED Final   Coronavirus OC43 NOT DETECTED NOT DETECTED Final   Metapneumovirus NOT DETECTED NOT DETECTED Final   Rhinovirus /  Enterovirus NOT DETECTED NOT DETECTED Final   Influenza A NOT DETECTED NOT DETECTED Final   Influenza B NOT DETECTED NOT DETECTED Final   Parainfluenza Virus 1 NOT DETECTED NOT DETECTED Final   Parainfluenza Virus 2 NOT DETECTED NOT DETECTED Final   Parainfluenza Virus 3 NOT DETECTED NOT DETECTED Final   Parainfluenza Virus 4 NOT DETECTED NOT DETECTED Final   Respiratory Syncytial Virus NOT DETECTED NOT DETECTED Final   Bordetella pertussis NOT DETECTED NOT DETECTED Final   Bordetella Parapertussis NOT DETECTED NOT DETECTED Final   Chlamydophila pneumoniae NOT DETECTED NOT DETECTED Final   Mycoplasma pneumoniae NOT DETECTED NOT DETECTED Final    Comment: Performed at [facility], [place]. [address], [place], [place] [address]   ESR 115 CRP 12.5 Procal 0.25 UC proteus 10K  on cefazolin BC 6/10 NG 6/10 sars COV2 neg RSV panel neg  IMAGING RESULTS: The urinary bladder is decompressed by a Foley catheter and suboptimally evaluated. There is possible bladder wall thickening and surrounding inflammation which could indicate cystitis.2. No evidence of urinary tract calculus, hydronephrosis or perinephric inflammation. 3. No evidence of bowel inflammation or obstruction.4. Chronic sacroiliitis and facet arthropathy in the lower lumbar spine.The urinary bladder is decompressed by a Foley catheter and suboptimally evaluated. There is possible bladder wall thickening and surrounding inflammation which could indicate cystitis. 2. No evidence of urinary tract calculus, hydronephrosis or perinephric inflammation. 3. No evidence of bowel inflammation or obstruction. 4. Chronic sacroiliitis and facet arthropathy in the lower lumbar spine. I have personally reviewed the films  [place] legs- DVT neg  ? Impression/Recommendation ? ?Fever in a patient with neurogenic bladder, foley and cervical cord infarction at c6-c7 level ?Has proteus cystitis and is on appropriate antibiotics but fever  persist inspite of antibiotics- raises concern for autonomic fever No evidence of pneumonia No evidnce of cellulitis No evidence of Surgical site infection Complete cefazolin today to finish 5 days of Rx cystitis/Uti   Spinal cord infarction causing quadriplegia with some recovery of upper extremities  Cervical myelopathy needing cervical laminectomies and post fusion C5-C7 ___________________________________________________ Discussed with patient, requesting provider Note:  This document was prepared using Dragon voice recognition software and may include unintentional dictation errors.

## 2020-12-27 NOTE — NC FL2 (Signed)
[place] MEDICAID FL2 LEVEL OF CARE SCREENING TOOL     IDENTIFICATION  Patient Name: Wendy Nelson Birthdate: 12-Jun-1966 Sex: female Admission Date (Current Location): 12/23/2020  [facility] and [place] Number:  Chiropodist and Address:  [facility], [address] [place], [place] [address]      Provider Number: [id number]  Attending Physician Name and Address:  Almon Hercules, MD  Relative Name and Phone Number:       Current Level of Care: Hospital Recommended Level of Care: Skilled Nursing Facility Prior Approval Number:    Date Approved/Denied:   PASRR Number:    Discharge Plan: SNF    Current Diagnoses: Patient Active Problem List   Diagnosis Date Noted   Sepsis (HCC) 12/23/2020   Neurogenic bladder    Syncope 10/19/2020   AKI (acute kidney injury) (HCC) 10/19/2020   Bilateral hydronephrosis 10/19/2020   HTN (hypertension) 10/19/2020   HLD (hyperlipidemia) 10/19/2020   Depression with anxiety 10/19/2020   Hypotension 09/2020   Cervical spondylosis with myelopathy and radiculopathy 09/2020   Spinal cord infarction (HCC) 09/2020    Orientation RESPIRATION BLADDER Height & Weight     Self, Time, Situation, Place  Normal Indwelling catheter Weight: 116.3 kg Height:  5\' 5"  (165.1 cm)  BEHAVIORAL SYMPTOMS/MOOD NEUROLOGICAL BOWEL NUTRITION STATUS      Continent Diet (see discharge summary)  AMBULATORY STATUS COMMUNICATION OF NEEDS Skin   Extensive Assist Verbally Normal                       Personal Care Assistance Level of Assistance  Bathing, Feeding, Dressing, Total care Bathing Assistance: Maximum assistance Feeding assistance: Limited assistance Dressing Assistance: Maximum assistance Total Care Assistance: Limited assistance   Functional Limitations Info             SPECIAL CARE FACTORS FREQUENCY  PT (By licensed PT), OT (By licensed OT)     PT Frequency: 5 times per week OT Frequency: 5 times per week             Contractures Contractures Info: Not present    Additional Factors Info  Code Status, Allergies Code Status Info: Full Allergies Info: Melatonin, neomycin, sulfa           Current Medications (12/27/2020):  This is the current hospital active medication list Current Facility-Administered Medications  Medication Dose Route Frequency Provider Last Rate Last Admin   0.9 %  sodium chloride infusion   Intravenous PRN 12/29/2020, MD 10 mL/hr at 12/26/20 1505 500 mL at 12/26/20 1505   acamprosate (CAMPRAL) tablet 333 mg  333 mg Oral TID 12/28/20, MD   333 mg at 12/26/20 2211   acetaminophen (TYLENOL) tablet 650 mg  650 mg Oral Q6H PRN 2212, MD   650 mg at 12/27/20 12/29/20   Or   acetaminophen (TYLENOL) suppository 650 mg  650 mg Rectal Q6H PRN 4287, MD       ascorbic acid (VITAMIN C) tablet 500 mg  500 mg Oral BID Venora Maples, MD   500 mg at 12/26/20 2211   aspirin EC tablet 325 mg  325 mg Oral Daily 2212, MD   325 mg at 12/26/20 0834   atorvastatin (LIPITOR) tablet 10 mg  10 mg Oral Daily 12/28/20, MD   10 mg at 12/26/20 12/28/20   ceFAZolin (ANCEF) 1 g in sodium chloride 0.9 % 100 mL IVPB  1 g Intravenous Q8H Sharen Hones, RPH   1 g at 12/27/20 0540   Chlorhexidine Gluconate Cloth 2 % PADS 6 each  6 each Topical Daily Venora Maples, MD   6 each at 12/26/20 0936   dicyclomine (BENTYL) capsule 10 mg  10 mg Oral TID WC Venora Maples, MD   10 mg at 12/26/20 1731   DULoxetine (CYMBALTA) DR capsule 60 mg  60 mg Oral Daily Venora Maples, MD   60 mg at 12/26/20 0839   enoxaparin (LOVENOX) injection 60 mg  60 mg Subcutaneous Q24H Venora Maples, MD   60 mg at 12/26/20 2211   meloxicam (MOBIC) tablet 7.5 mg  7.5 mg Oral Daily Venora Maples, MD   7.5 mg at 12/26/20 4287   methocarbamol (ROBAXIN) 500 mg in dextrose 5 % 50 mL IVPB  500 mg Intravenous Q6H PRN Venora Maples, MD        multivitamin with minerals tablet 1 tablet  1 tablet Oral Daily Venora Maples, MD   1 tablet at 12/26/20 0831   ondansetron (ZOFRAN) tablet 4 mg  4 mg Oral Q6H PRN Venora Maples, MD   4 mg at 12/25/20 0900   Or   ondansetron (ZOFRAN) injection 4 mg  4 mg Intravenous Q6H PRN Venora Maples, MD   4 mg at 12/24/20 1711   oxyCODONE (Oxy IR/ROXICODONE) immediate release tablet 5 mg  5 mg Oral Q4H PRN Venora Maples, MD   5 mg at 12/24/20 0132   pantoprazole (PROTONIX) EC tablet 80 mg  80 mg Oral Daily Venora Maples, MD   80 mg at 12/26/20 6811   polyethylene glycol (MIRALAX / GLYCOLAX) packet 17 g  17 g Oral Daily Venora Maples, MD   17 g at 12/26/20 0831   pregabalin (LYRICA) capsule 100 mg  100 mg Oral BID Venora Maples, MD   100 mg at 12/26/20 2211   terbinafine (LAMISIL) 1 % cream   Topical BID Almon Hercules, MD   Given at 12/26/20 2213   tiZANidine (ZANAFLEX) tablet 2 mg  2 mg Oral BID Venora Maples, MD   2 mg at 12/26/20 2211     Discharge Medications: Please see discharge summary for a list of discharge medications.  Relevant Imaging Results:  Relevant Lab Results:   Additional Information SS# [id number]  Allayne Butcher, RN

## 2020-12-27 NOTE — Progress Notes (Signed)
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  Reportedly febrile to 103 at nursing facility and started on Keflex for presumed UTI.  In ED, mild temp to 100.3.  Tachycardic to 107.  Tachypnea low 20s.  Leukocytosis to 11.6.  Hgb 10.9.  Otherwise, CBC and CMP without significant finding.  UA with moderate LE and rare bacteria.  CXR with mild cardiomegaly.  CT abdomen and pelvis w/o contrast with decompressed bladder but possible bladder wall thickening suggestive for cystitis.  Cultures obtained.  Patient was started on IV ceftriaxone.  Antibiotics escalated to IV cefepime on admission.  Urine culture with 10,000 colonies of Proteus Mirabilis.  Antibiotics de-escalated to IV Ancef.  However, patient continues to spike fever despite appropriate antibiotics.  No other obvious source of infection or leukocytosis.  D-dimer elevated but LE US negative for DVT.  Inflammatory markers elevated.  Concern about autonomic dysregulation.  Infectious disease consulted.  Subjective: Seen and examined earlier this morning.  Again she spiked fever to 102.7 last night.  She has no complaints.  She denies headache, chest pain, cough, shortness of breath or GI symptoms. Objective: Vitals:   12/27/20 0225 12/27/20 0338 12/27/20 0700 12/27/20 1140  BP:  122/84 118/79 95/70  Pulse:  97 95 (!) 114  Resp:  20 18 16   Temp: 100.2 F (37.9 C) 98.2 F (36.8 C) 98.1 F (36.7 C) 98 F (36.7 C)  TempSrc: Oral Oral Oral   SpO2:  96% 98% 100%  Weight:      Height:        Intake/Output Summary (Last 24 hours)  at 12/27/2020 1428 Last data filed at 12/27/2020 1030 Gross per 24 hour  Intake 240 ml  Output 1925 ml  Net -1685 ml   Filed Weights   12/23/20 1610  Weight: 116.3 kg    Examination:  GENERAL: No apparent distress.  Nontoxic. HEENT: MMM.  Vision and hearing grossly intact.  NECK: Supple.  No apparent JVD.  RESP: On RA.  No IWOB.  Fair aeration bilaterally but limited exam. CVS:  RRR. Heart sounds normal.  ABD/GI/GU: BS+. Abd soft, NTND.  Indwelling Foley. MSK/EXT: Paraplegic. SKIN: no apparent skin lesion or wound NEURO: Awake and alert. Oriented appropriately.  Paraplegic. PSYCH: Calm. Normal affect.   Procedures:  None  Microbiology summarized: COVID-19 PCR negative. Blood cultures NGTD. Urine culture with 10,000 colonies Proteus mirabilis. RVP panel negative.  Assessment & Plan: Sepsis due to catheter associated UTI in patient with chronic indwelling Foley.  Recently started on Keflex outpatient.  UA with moderate LE and rare bacteria.  Culture data as above.  Continues to spike fever despite appropriate antibiotic.  -Foley replaced on admission. -De-escalate antibiotic to IV Ancef  Fever of unknown origin-patient continues to spike fever despite appropriate antibiotic.  CT A/P, CXR, RVP panel, blood cultures negative.  Urine culture as above. Trazodone discontinued out of concern for possible serotonin syndrome with Cymbalta.  Patient is basically asymptomatic and clinically well.  Has no leukocytosis either.  However, inflammatory markers elevated.  Autoimmune?  Autonomic dysregulation? -ID consulted.  Possible tinea pedis -Terbinafine cream twice daily for  2 weeks   BLE diplegia/spinal cord injury/chronic pain -Continue home pain medications-Lyrica, tizanidine, Mobic and as needed oxy  History of hypotension:  On midodrine.  Normotensive. -Discontinued IV fluid and midodrine  Anxiety/depression: Stable.  Spiking fever despite appropriate antibiotics.  BP  slightly high. -Continue home Cymbalta -Discontinue trazodone out of concern for serotonin syndrome.  Neuropathy-continue Lyrica  History of CVA: -Continue statin and aspirin.  Morbid obesity Body mass index is 42.67 kg/m.         DVT prophylaxis:    Subcu Lovenox  Code Status: Full code Family Communication: Updated patient's daughter over the phone on 6/12. Level of care: Med-Surg Status is: Inpatient  Remains inpatient appropriate because:IV treatments appropriate due to intensity of illness or inability to take PO and Inpatient level of care appropriate due to severity of illness  Dispo: The patient is from:  LTF              Anticipated d/c is to: SNF              Patient currently is not medically stable to d/c.   Difficult to place patient No    Consultants:  Infectious disease  Sch Meds:  Scheduled Meds:  acamprosate  333 mg Oral TID   vitamin C  500 mg Oral BID   aspirin EC  325 mg Oral Daily   atorvastatin  10 mg Oral Daily    ceFAZolin (ANCEF) IV  1 g Intravenous Q8H   Chlorhexidine Gluconate Cloth  6 each Topical Daily   dicyclomine  10 mg Oral TID WC   DULoxetine  60 mg Oral Daily   enoxaparin (LOVENOX) injection  60 mg Subcutaneous Q24H   meloxicam  7.5 mg Oral Daily   multivitamin with minerals  1 tablet Oral Daily   pantoprazole  80 mg Oral Daily   polyethylene glycol  17 g Oral Daily   pregabalin  100 mg Oral BID   terbinafine   Topical BID   tiZANidine  2 mg Oral BID   Continuous Infusions:  sodium chloride 500 mL (12/26/20 1505)   methocarbamol (ROBAXIN) IV     PRN Meds:.sodium chloride, acetaminophen **OR** acetaminophen, methocarbamol (ROBAXIN) IV, ondansetron **OR** ondansetron (ZOFRAN) IV, oxyCODONE  Antimicrobials: Anti-infectives (From admission, onward)    Start     Dose/Rate Route Frequency Ordered Stop   12/26/20 2200  ceFAZolin (ANCEF) 1 g in sodium chloride 0.9 % 100 mL IVPB        1 g 200 mL/hr over 30 Minutes  Intravenous Every 8 hours 12/26/20 1550     12/25/20 1400  ceFAZolin (ANCEF) IVPB 1 g/50 mL premix  Status:  Discontinued        1 g 100 mL/hr over 30 Minutes Intravenous Every 8 hours 12/25/20 1214 12/26/20 1550   12/24/20 0500  ceFEPIme (MAXIPIME) 2 g in sodium chloride 0.9 % 100 mL IVPB  Status:  Discontinued        2 g 200 mL/hr over 30 Minutes Intravenous Every 12 hours 12/23/20 1504 12/23/20 1619   12/23/20 1700  ceFEPIme (MAXIPIME) 2 g in sodium chloride 0.9 % 100 mL IVPB  Status:  Discontinued        2 g 200 mL/hr over 30 Minutes Intravenous Every 8 hours 12/23/20 1619 12/25/20 1214   12/23/20 1600  ceFEPIme (MAXIPIME) 2 g in sodium chloride 0.9 % 100 mL IVPB  Status:  Discontinued        2 g 200 mL/hr over  30 Minutes Intravenous  Once 12/23/20 1501 12/23/20 1619   12/23/20 1045  cefTRIAXone (ROCEPHIN) 1 g in sodium chloride 0.9 % 100 mL IVPB        1 g 200 mL/hr over 30 Minutes Intravenous  Once 12/23/20 1035 12/23/20 1230        I have personally reviewed the following labs and images: CBC: Recent Labs  Lab 12/23/20 1025 12/24/20 0532 12/25/20 0719 12/26/20 1541  WBC 11.6* 8.0 8.5 8.5  NEUTROABS 7.2  --  3.8  --   HGB 10.9* 10.6* 10.6* 11.0*  HCT 32.7* 32.5* 32.5* 33.7*  MCV 86.1 85.8 85.8 85.5  PLT 233 231 273 325   BMP &GFR Recent Labs  Lab 12/23/20 1025 12/24/20 0532 12/26/20 1541  NA 135 140 135  K 4.1 4.1 4.7  CL 100 104 97*  CO2 25 29 32  GLUCOSE 101* 77 137*  BUN CREATININE 0.72 0.72 0.79  CALCIUM 8.9 9.0 9.1  MG  --   --  1.9  PHOS  --   --  3.8   Estimated Creatinine Clearance: 103.6 mL/min (by C-G formula based on SCr of 0.79 mg/dL). Liver & Pancreas: Recent Labs  Lab 12/23/20 1025 12/26/20 1541  AST 23  --   ALT 39  --   ALKPHOS 134*  --   BILITOT 0.9  --   PROT 7.0  --   ALBUMIN 3.0* 2.7*   No results for input(s): LIPASE, AMYLASE in the last 168 hours. No results for input(s): AMMONIA in the last 168  hours. Diabetic: No results for input(s): HGBA1C in the last 72 hours. No results for input(s): GLUCAP in the last 168 hours. Cardiac Enzymes: No results for input(s): CKTOTAL, CKMB, CKMBINDEX, TROPONINI in the last 168 hours. No results for input(s): PROBNP in the last 8760 hours. Coagulation Profile: Recent Labs  Lab 12/23/20 1025  INR 1.2   Thyroid Function Tests: No results for input(s): TSH, T4TOTAL, FREET4, T3FREE, THYROIDAB in the last 72 hours. Lipid Profile: No results for input(s): CHOL, HDL, LDLCALC, TRIG, CHOLHDL, LDLDIRECT in the last 72 hours. Anemia Panel: No results for input(s): VITAMINB12, FOLATE, FERRITIN, TIBC, IRON, RETICCTPCT in the last 72 hours. Urine analysis:    Component Value Date/Time   COLORURINE STRAW (A) 12/23/2020 1026   APPEARANCEUR CLEAR (A) 12/23/2020 1026   LABSPEC 1.003 (L) 12/23/2020 1026   PHURINE 7.0 12/23/2020 1026   GLUCOSEU NEGATIVE 12/23/2020 1026   HGBUR NEGATIVE 12/23/2020 1026   BILIRUBINUR NEGATIVE 12/23/2020 1026   KETONESUR NEGATIVE 12/23/2020 1026   PROTEINUR NEGATIVE 12/23/2020 1026   NITRITE NEGATIVE 12/23/2020 1026   LEUKOCYTESUR MODERATE (A) 12/23/2020 1026   Sepsis Labs: Invalid input(s): PROCALCITONIN, LACTICIDVEN  Microbiology: Recent Results (from the past 240 hour(s))  Culture, blood (Routine x 2)     Status: None (Preliminary result)   Collection Time: 12/23/20 10:25 AM   Specimen: BLOOD LEFT HAND  Result Value Ref Range Status   Specimen Description BLOOD LEFT HAND  Final   Special Requests   Final    BOTTLES DRAWN AEROBIC AND ANAEROBIC Blood Culture adequate volume   Culture   Final    NO GROWTH 4 DAYS Performed at [facility], [address] [place], [place] [address]    Report Status PENDING  Incomplete  Culture, blood (Routine x 2)     Status: None (Preliminary result)   Collection Time: 12/23/20 10:30 AM   Specimen: Right Antecubital; Blood  Result Value Ref Range Status   Specimen  Description RIGHT ANTECUBITAL  Final   Special Requests   Final    BOTTLES DRAWN AEROBIC AND ANAEROBIC Blood Culture adequate volume   Culture   Final    NO GROWTH 4 DAYS Performed at [facility], [address], [place], [place] [address]    Report Status PENDING  Incomplete  Urine culture     Status: Abnormal   Collection Time: 12/23/20 10:36 AM   Specimen: Urine, Random  Result Value Ref Range Status   Specimen Description   Final    URINE, RANDOM Performed at [facility], [address] [place], [place] [address]    Special Requests   Final    NONE Performed at [facility], [address] [place], [place] [address]    Culture 10,000 COLONIES/mL PROTEUS MIRABILIS (A)  Final   Report Status 12/25/2020 FINAL  Final   Organism ID, Bacteria PROTEUS MIRABILIS (A)  Final      Susceptibility   Proteus mirabilis - MIC*    AMPICILLIN <=2 SENSITIVE Sensitive     CEFAZOLIN <=4 SENSITIVE Sensitive     CEFEPIME <=0.12 SENSITIVE Sensitive     CEFTRIAXONE <=0.25 SENSITIVE Sensitive     CIPROFLOXACIN <=0.25 SENSITIVE Sensitive     GENTAMICIN <=1 SENSITIVE Sensitive     IMIPENEM 1 SENSITIVE Sensitive     NITROFURANTOIN 128 RESISTANT Resistant     TRIMETH/SULFA <=20 SENSITIVE Sensitive     AMPICILLIN/SULBACTAM <=2 SENSITIVE Sensitive     PIP/TAZO <=4 SENSITIVE Sensitive     * 10,000 COLONIES/mL PROTEUS MIRABILIS  SARS CORONAVIRUS 2 (TAT 6-24 HRS) Nasopharyngeal Nasopharyngeal Swab     Status: None   Collection Time: 12/23/20  1:20 PM   Specimen: Nasopharyngeal Swab  Result Value Ref Range Status   SARS Coronavirus 2 NEGATIVE NEGATIVE Final    Comment: (NOTE) SARS-CoV-2 target nucleic acids are NOT DETECTED.  The SARS-CoV-2 RNA is generally detectable in upper and lower respiratory specimens during the acute phase of infection. Negative results do not preclude SARS-CoV-2 infection, do not rule out co-infections with other pathogens, and should not be  used as the sole basis for treatment or other patient management decisions. Negative results must be combined with clinical observations, patient history, and epidemiological information. The expected result is Negative.  Fact Sheet for Patients: HairSlick.no  Fact Sheet for Healthcare Providers: quierodirigir.com  This test is not yet approved or cleared by the [place] FDA and  has been authorized for detection and/or diagnosis of SARS-CoV-2 by FDA under an Emergency Use Authorization (EUA). This EUA will remain  in effect (meaning this test can be used) for the duration of the COVID-19 declaration under Se ction 564(b)(1) of the Act, 21 U.S.C. section 360bbb-3(b)(1), unless the authorization is terminated or revoked sooner.  Performed at [facility], [address] [place], [place] [address]   Respiratory (~20 pathogens) panel by PCR     Status: None   Collection Time: 12/26/20 10:17 AM   Specimen: Nasopharyngeal Swab; Respiratory  Result Value Ref Range Status   Adenovirus NOT DETECTED NOT DETECTED Final   Coronavirus 229E NOT DETECTED NOT DETECTED Final    Comment: (NOTE) The Coronavirus on the Respiratory Panel, DOES NOT test for the novel  Coronavirus (2019 nCoV)    Coronavirus HKU1 NOT DETECTED NOT DETECTED Final   Coronavirus NL63 NOT DETECTED NOT DETECTED Final   Coronavirus OC43 NOT DETECTED NOT DETECTED Final  Metapneumovirus NOT DETECTED NOT DETECTED Final   Rhinovirus / Enterovirus NOT DETECTED NOT DETECTED Final   Influenza A NOT DETECTED NOT DETECTED Final   Influenza B NOT DETECTED NOT DETECTED Final   Parainfluenza Virus 1 NOT DETECTED NOT DETECTED Final   Parainfluenza Virus 2 NOT DETECTED NOT DETECTED Final   Parainfluenza Virus 3 NOT DETECTED NOT DETECTED Final   Parainfluenza Virus 4 NOT DETECTED NOT DETECTED Final   Respiratory Syncytial Virus NOT DETECTED NOT DETECTED Final    Bordetella pertussis NOT DETECTED NOT DETECTED Final   Bordetella Parapertussis NOT DETECTED NOT DETECTED Final   Chlamydophila pneumoniae NOT DETECTED NOT DETECTED Final   Mycoplasma pneumoniae NOT DETECTED NOT DETECTED Final    Comment: Performed at [facility], [address], [place], [place] [address]    Radiology Studies: US Venous Img Lower Bilateral (DVT)  Result Date: 12/26/2020 CLINICAL DATA:  Elevated D-dimer EXAM: BILATERAL LOWER EXTREMITY VENOUS DOPPLER ULTRASOUND TECHNIQUE: Gray-scale sonography with compression, as well as color and duplex ultrasound, were performed to evaluate the deep venous system(s) from the level of the common femoral vein through the popliteal and proximal calf veins. COMPARISON:  None. FINDINGS: VENOUS Normal compressibility of the common femoral, superficial femoral, and popliteal veins, as well as the visualized calf veins. Visualized portions of profunda femoral vein and great saphenous vein unremarkable. No filling defects to suggest DVT on grayscale or color Doppler imaging. Doppler waveforms show normal direction of venous flow, normal respiratory plasticity and response to augmentation. OTHER None. Limitations: none IMPRESSION: Negative. Electronically Signed   By: Charlett Nose M.D.   On: 12/26/2020 22:46       Briann Sarchet T. Velencia Lenart [facility]  If 7PM-7AM, please contact night-coverage [email or web address] 12/27/2020, 2:28 PM

## 2020-12-27 NOTE — Progress Notes (Signed)
   12/27/20 1140  Assess: MEWS Score  Temp 98 F (36.7 C)  BP 95/70  Pulse Rate (!) 114  Resp 16  Level of Consciousness Alert  SpO2 100 %  O2 Device Room Air  Assess: MEWS Score  MEWS Temp 0  MEWS Systolic 1  MEWS Pulse 2  MEWS RR 0  MEWS LOC 0  MEWS Score 3  MEWS Score Color Yellow  Assess: if the MEWS score is Yellow or Red  Were vital signs taken at a resting state? Yes  Focused Assessment Change from prior assessment (see assessment flowsheet)  Early Detection of Sepsis Score *See Row Information* Low  MEWS guidelines implemented *See Row Information* Yes  Treat  MEWS Interventions Other (Comment) (waiting for MD orders)  Pain Scale 0-10  Pain Score 0  Take Vital Signs  Increase Vital Sign Frequency  Yellow: Q 2hr X 2 then Q 4hr X 2, if remains yellow, continue Q 4hrs  Escalate  MEWS: Escalate Yellow: discuss with charge nurse/RN and consider discussing with provider and RRT  Notify: Charge Nurse/RN  Name of Charge Nurse/RN Notified Theodosia Quay, RN  Date Charge Nurse/RN Notified 12/27/20  Time Charge Nurse/RN Notified 1144  Notify: Provider  Provider Name/Title Dr. Alanda Slim  Date Provider Notified 12/27/20  Time Provider Notified 1144  Notification Type  (secure chat)  Notification Reason Change in status  Provider response See new orders  Date of Provider Response 12/27/20  Time of Provider Response 1248  Document  Patient Outcome Other (Comment) (continue to monitor)  Progress note created (see row info) Yes

## 2020-12-28 DIAGNOSIS — G9511 Acute infarction of spinal cord (embolic) (nonembolic): Secondary | ICD-10-CM | POA: Diagnosis not present

## 2020-12-28 DIAGNOSIS — N390 Urinary tract infection, site not specified: Secondary | ICD-10-CM

## 2020-12-28 DIAGNOSIS — T83511A Infection and inflammatory reaction due to indwelling urethral catheter, initial encounter: Principal | ICD-10-CM

## 2020-12-28 LAB — RENAL FUNCTION PANEL
Albumin: 2.9 g/dL — ABNORMAL LOW (ref 3.5–5.0)
Anion gap: 10 (ref 5–15)
BUN: 14 mg/dL (ref 6–20)
CO2: 28 mmol/L (ref 22–32)
Calcium: 9 mg/dL (ref 8.9–10.3)
Chloride: 94 mmol/L — ABNORMAL LOW (ref 98–111)
Creatinine, Ser: 0.74 mg/dL (ref 0.44–1.00)
GFR, Estimated: >60 mL/min (ref >60)
Glucose, Bld: 96 mg/dL (ref 70–99)
Phosphorus: 3.6 mg/dL (ref 2.5–4.6)
Potassium: 4.4 mmol/L (ref 3.5–5.1)
Sodium: 132 mmol/L — ABNORMAL LOW (ref 135–145)

## 2020-12-28 LAB — CBC
HCT: 32.8 % — ABNORMAL LOW (ref 36.0–46.0)
Hemoglobin: 10.9 g/dL — ABNORMAL LOW (ref 12.0–15.0)
MCH: 28.1 pg (ref 26.0–34.0)
MCHC: 33.2 g/dL (ref 30.0–36.0)
MCV: 84.5 fL (ref 80.0–100.0)
Platelets: 452 10*3/uL — ABNORMAL HIGH (ref 150–400)
RBC: 3.88 MIL/uL (ref 3.87–5.11)
RDW: 14.7 % (ref 11.5–15.5)
WBC: 13.2 10*3/uL — ABNORMAL HIGH (ref 4.0–10.5)
nRBC: 0 % (ref 0.0–0.2)

## 2020-12-28 LAB — MAGNESIUM: Magnesium: 1.9 mg/dL (ref 1.7–2.4)

## 2020-12-28 LAB — CULTURE, BLOOD (ROUTINE X 2)
Culture: NO GROWTH
Culture: NO GROWTH
Special Requests: ADEQUATE
Special Requests: ADEQUATE

## 2020-12-28 LAB — HEPATIC FUNCTION PANEL
ALT: 20 U/L (ref 0–44)
AST: 27 U/L (ref 15–41)
Albumin: 2.9 g/dL — ABNORMAL LOW (ref 3.5–5.0)
Alkaline Phosphatase: 100 U/L (ref 38–126)
Bilirubin, Direct: <0.1 mg/dL (ref 0.0–0.2)
Total Bilirubin: 0.5 mg/dL (ref 0.3–1.2)
Total Protein: 7 g/dL (ref 6.5–8.1)

## 2020-12-28 LAB — FERRITIN: Ferritin: 272 ng/mL (ref 11–307)

## 2020-12-28 LAB — LACTATE DEHYDROGENASE: LDH: 221 U/L — ABNORMAL HIGH (ref 98–192)

## 2020-12-28 MED ORDER — IBUPROFEN 400 MG PO TABS
400.0000 mg | ORAL_TABLET | Freq: Four times a day (QID) | ORAL | Status: DC | PRN
  Administered 2020-12-28 (×2): 400 mg via ORAL
  Filled 2020-12-28 (×2): qty 1

## 2020-12-28 MED ORDER — BISACODYL 10 MG RE SUPP: 10.0000 mg | Freq: Every day | RECTAL | Status: DC | PRN

## 2020-12-28 MED ORDER — METHOCARBAMOL 500 MG PO TABS
500.0000 mg | ORAL_TABLET | Freq: Four times a day (QID) | ORAL | Status: DC | PRN
  Filled 2020-12-28: qty 1

## 2020-12-28 MED ORDER — SODIUM CHLORIDE 0.9 % IV SOLN: INTRAVENOUS | Status: AC

## 2020-12-28 MED ORDER — NYSTATIN 100000 UNIT/GM EX POWD
Freq: Two times a day (BID) | CUTANEOUS | Status: DC
  Filled 2020-12-28: qty 15

## 2020-12-28 MED ORDER — SODIUM CHLORIDE 0.9 % IV SOLN
2.0000 g | Freq: Three times a day (TID) | INTRAVENOUS | Status: DC
  Administered 2020-12-28 – 2021-01-03 (×18): 2 g via INTRAVENOUS
  Filled 2020-12-28 (×21): qty 2

## 2020-12-28 MED ORDER — VANCOMYCIN HCL 1000 MG/200ML IV SOLN
1000.0000 mg | Freq: Two times a day (BID) | INTRAVENOUS | Status: DC
  Administered 2020-12-29: 07:00:00 1000 mg via INTRAVENOUS
  Filled 2020-12-28 (×3): qty 200

## 2020-12-28 MED ORDER — POLYETHYLENE GLYCOL 3350 17 G PO PACK
17.0000 g | PACK | Freq: Two times a day (BID) | ORAL | Status: DC
  Administered 2020-12-28 – 2020-12-30 (×4): 17 g via ORAL
  Filled 2020-12-28 (×8): qty 1

## 2020-12-28 MED ORDER — VANCOMYCIN HCL 2000 MG/400ML IV SOLN
2000.0000 mg | Freq: Once | INTRAVENOUS | Status: AC
  Administered 2020-12-28: 2000 mg via INTRAVENOUS
  Filled 2020-12-28: qty 400

## 2020-12-28 NOTE — Progress Notes (Signed)
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  Reportedly febrile to 103 at nursing facility and started on Keflex for presumed UTI.  In ED, mild temp to 100.3.  Tachycardic to 107.  Tachypnea low 20s.  Leukocytosis to 11.6.  Hgb 10.9.  Otherwise, CBC and CMP without significant finding.  UA with moderate LE and rare bacteria.  CXR with mild cardiomegaly.  CT abdomen and pelvis w/o contrast with decompressed bladder but possible bladder wall thickening suggestive for cystitis.  Cultures obtained.  Patient was started on IV ceftriaxone.  Antibiotics escalated to IV cefepime on admission.  Urine culture with 10,000 colonies of Proteus Mirabilis.  Antibiotics de-escalated to IV Ancef.  However, patient continues to spike fever despite appropriate antibiotics.  No other obvious source of infection or leukocytosis.  D-dimer elevated but LE [place] negative for DVT.  Inflammatory markers elevated.  Concern about autonomic dysregulation.  Infectious disease consulted.  Subjective: Fever again this AM   Objective: Vitals:   12/28/20 0638 12/28/20 0730 12/28/20 0758 12/28/20 1144  BP:   128/77 104/64  Pulse:   90 91  Resp:   20 18  Temp: (!) 102.7 F (39.3 C) (!) 100.8 F (38.2 C) (!) 102.9 F (39.4 C) (!) 100.7 F (38.2 C)  TempSrc:   Oral Oral  SpO2:   94% 94%  Weight:      Height:        Intake/Output Summary (Last 24 hours) at 12/28/2020 1302 Last data filed at 12/28/2020 1030 Gross per 24 hour  Intake 860.09 ml  Output 1675 ml  Net -814.91 ml   Filed Weights   12/23/20 1610   Weight: 116.3 kg    Examination:   General: Appearance:    Severely obese female in no acute distress- in bed     Lungs:     respirations unlabored  Heart:    Normal heart rate   MS:   All extremities are intact.    Neurologic:   Awake, alert     Procedures:  None  Microbiology summarized: COVID-19 PCR negative. Blood cultures NGTD. Urine culture with 10,000 colonies Proteus mirabilis. RVP panel negative.  Assessment & Plan: Sepsis thought due to catheter associated UTI in patient with chronic indwelling Foley.  Recently started on Keflex outpatient.  UA with moderate LE and rare bacteria.  Culture data as above.  Continues to spike fever despite appropriate antibiotic.  -Foley replaced on admission. -abx per ID ? Drug fever and ? Need to stop abx  Fever of unknown origin-patient continues to spike fever despite appropriate antibiotic.  CT A/P, CXR, RVP panel, blood cultures negative.  Urine culture as above. Trazodone discontinued out of concern for possible serotonin syndrome with Cymbalta.   - inflammatory markers elevated.  -ID consult appreciated   BLE diplegia/spinal cord injury/chronic pain -Continue home pain medications-Lyrica, tizanidine, Mobic and as needed oxy  Anxiety/depression: Stable. -Continue home Cymbalta -Discontinue trazodone out of concern for serotonin syndrome.  Neuropathy-continue Lyrica  History of CVA: -Continue statin and aspirin.  Morbid obesity Body mass index is 42.67 kg/m.  DVT prophylaxis:    Subcu Lovenox  Code Status: Full code Level of care: Med-Surg Status is: Inpatient  Remains inpatient appropriate because:IV treatments appropriate due to intensity of illness or inability to take PO and Inpatient level of care appropriate due to severity of illness  Dispo: The patient is from:  LTF              Anticipated d/c is to: prior facility with PT              Patient currently is not medically stable to d/c.    Difficult to place patient No    Consultants:  Infectious disease  Sch Meds:  Scheduled Meds:  acamprosate  333 mg Oral TID   vitamin C  500 mg Oral BID   aspirin EC  325 mg Oral Daily   atorvastatin  10 mg Oral Daily   Chlorhexidine Gluconate Cloth  6 each Topical Daily   dicyclomine  10 mg Oral TID WC   DULoxetine  60 mg Oral Daily   enoxaparin (LOVENOX) injection  60 mg Subcutaneous Q24H   multivitamin with minerals  1 tablet Oral Daily   pantoprazole  80 mg Oral Daily   polyethylene glycol  17 g Oral BID   pregabalin  100 mg Oral BID   terbinafine   Topical BID   tiZANidine  2 mg Oral BID   Continuous Infusions:  sodium chloride Stopped (12/27/20 0935)   PRN Meds:.sodium chloride, acetaminophen **OR** acetaminophen, bisacodyl, ibuprofen, methocarbamol, ondansetron **OR** ondansetron (ZOFRAN) IV, oxyCODONE  Antimicrobials: Anti-infectives (From admission, onward)    Start     Dose/Rate Route Frequency Ordered Stop   12/26/20 2200  ceFAZolin (ANCEF) 1 g in sodium chloride 0.9 % 100 mL IVPB  Status:  Discontinued        1 g 200 mL/hr over 30 Minutes Intravenous Every 8 hours 12/26/20 1550 12/28/20 0839   12/25/20 1400  ceFAZolin (ANCEF) IVPB 1 g/50 mL premix  Status:  Discontinued        1 g 100 mL/hr over 30 Minutes Intravenous Every 8 hours 12/25/20 1214 12/26/20 1550   12/24/20 0500  ceFEPIme (MAXIPIME) 2 g in sodium chloride 0.9 % 100 mL IVPB  Status:  Discontinued        2 g 200 mL/hr over 30 Minutes Intravenous Every 12 hours 12/23/20 1504 12/23/20 1619   12/23/20 1700  ceFEPIme (MAXIPIME) 2 g in sodium chloride 0.9 % 100 mL IVPB  Status:  Discontinued        2 g 200 mL/hr over 30 Minutes Intravenous Every 8 hours 12/23/20 1619 12/25/20 1214   12/23/20 1600  ceFEPIme (MAXIPIME) 2 g in sodium chloride 0.9 % 100 mL IVPB  Status:  Discontinued        2 g 200 mL/hr over 30 Minutes Intravenous  Once 12/23/20 1501 12/23/20 1619   12/23/20 1045  cefTRIAXone (ROCEPHIN)  1 g in sodium chloride 0.9 % 100 mL IVPB        1 g 200 mL/hr over 30 Minutes Intravenous  Once 12/23/20 1035 12/23/20 1230        I have personally reviewed the following labs and images: CBC: Recent Labs  Lab 12/23/20 1025 12/24/20 0532 12/25/20 0719 12/26/20 1541 12/28/20 0529  WBC 11.6* 8.0 8.5 8.5 13.2*  NEUTROABS 7.2  --  3.8  --   --   HGB 10.9* 10.6* 10.6* 11.0* 10.9*  HCT 32.7* 32.5* 32.5* 33.7* 32.8*  MCV 86.1  85.8 85.8 85.5 84.5  PLT 233 231 273 325 452*   BMP &GFR Recent Labs  Lab 12/23/20 1025 12/24/20 0532 12/26/20 1541 12/28/20 0529  NA 135 140 135 132*  K 4.1 4.1 4.7 4.4  CL 100 104 97* 94*  CO2 25 29 32 28  GLUCOSE 101* 77 137* 96  BUN 14 14 13 14   CREATININE 0.72 0.72 0.79 0.74  CALCIUM 8.9 9.0 9.1 9.0  MG  --   --  1.9 1.9  PHOS  --   --  3.8 3.6   Estimated Creatinine Clearance: 103.6 mL/min (by C-G formula based on SCr of 0.74 mg/dL). Liver & Pancreas: Recent Labs  Lab 12/23/20 1025 12/26/20 1541 12/28/20 0529  AST 23  --  27  ALT 39  --  20  ALKPHOS 134*  --  100  BILITOT 0.9  --  0.5  PROT 7.0  --  7.0  ALBUMIN 3.0* 2.7* 2.9*  2.9*   No results for input(s): LIPASE, AMYLASE in the last 168 hours. No results for input(s): AMMONIA in the last 168 hours. Diabetic: No results for input(s): HGBA1C in the last 72 hours. No results for input(s): GLUCAP in the last 168 hours. Cardiac Enzymes: No results for input(s): CKTOTAL, CKMB, CKMBINDEX, TROPONINI in the last 168 hours. No results for input(s): PROBNP in the last 8760 hours. Coagulation Profile: Recent Labs  Lab 12/23/20 1025  INR 1.2   Thyroid Function Tests: No results for input(s): TSH, T4TOTAL, FREET4, T3FREE, THYROIDAB in the last 72 hours. Lipid Profile: No results for input(s): CHOL, HDL, LDLCALC, TRIG, CHOLHDL, LDLDIRECT in the last 72 hours. Anemia Panel: Recent Labs    12/28/20 0529  FERRITIN 272   Urine analysis:    Component Value Date/Time    COLORURINE STRAW (A) 12/23/2020 1026   APPEARANCEUR CLEAR (A) 12/23/2020 1026   LABSPEC 1.003 (L) 12/23/2020 1026   PHURINE 7.0 12/23/2020 1026   GLUCOSEU NEGATIVE 12/23/2020 1026   HGBUR NEGATIVE 12/23/2020 1026   BILIRUBINUR NEGATIVE 12/23/2020 1026   KETONESUR NEGATIVE 12/23/2020 1026   PROTEINUR NEGATIVE 12/23/2020 1026   NITRITE NEGATIVE 12/23/2020 1026   LEUKOCYTESUR MODERATE (A) 12/23/2020 1026   Sepsis Labs: Invalid input(s): PROCALCITONIN, LACTICIDVEN  Microbiology: Recent Results (from the past 240 hour(s))  Culture, blood (Routine x 2)     Status: None   Collection Time: 12/23/20 10:25 AM   Specimen: BLOOD LEFT HAND  Result Value Ref Range Status   Specimen Description BLOOD LEFT HAND  Final   Special Requests   Final    BOTTLES DRAWN AEROBIC AND ANAEROBIC Blood Culture adequate volume   Culture   Final    NO GROWTH 5 DAYS Performed at [facility], [address] [place], [place]    Report Status 12/28/2020 FINAL  Final  Culture, blood (Routine x 2)     Status: None   Collection Time: 12/23/20 10:30 AM   Specimen: Right Antecubital; Blood  Result Value Ref Range Status   Specimen Description RIGHT ANTECUBITAL  Final   Special Requests   Final    BOTTLES DRAWN AEROBIC AND ANAEROBIC Blood Culture adequate volume   Culture   Final    NO GROWTH 5 DAYS Performed at [facility], [address], [place], [place]    Report Status 12/28/2020 FINAL  Final  Urine culture     Status: Abnormal   Collection Time: 12/23/20 10:36 AM   Specimen: Urine, Random  Result  Value Ref Range Status   Specimen Description   Final    URINE, RANDOM Performed at [facility], [address] [place], [place] [address]    Special Requests   Final    NONE Performed at [facility], [address] [place], [place] [address]    Culture 10,000 COLONIES/mL PROTEUS MIRABILIS (A)  Final   Report Status 12/25/2020 FINAL  Final    Organism ID, Bacteria PROTEUS MIRABILIS (A)  Final      Susceptibility   Proteus mirabilis - MIC*    AMPICILLIN <=2 SENSITIVE Sensitive     CEFAZOLIN <=4 SENSITIVE Sensitive     CEFEPIME <=0.12 SENSITIVE Sensitive     CEFTRIAXONE <=0.25 SENSITIVE Sensitive     CIPROFLOXACIN <=0.25 SENSITIVE Sensitive     GENTAMICIN <=1 SENSITIVE Sensitive     IMIPENEM 1 SENSITIVE Sensitive     NITROFURANTOIN 128 RESISTANT Resistant     TRIMETH/SULFA <=20 SENSITIVE Sensitive     AMPICILLIN/SULBACTAM <=2 SENSITIVE Sensitive     PIP/TAZO <=4 SENSITIVE Sensitive     * 10,000 COLONIES/mL PROTEUS MIRABILIS  SARS CORONAVIRUS 2 (TAT 6-24 HRS) Nasopharyngeal Nasopharyngeal Swab     Status: None   Collection Time: 12/23/20  1:20 PM   Specimen: Nasopharyngeal Swab  Result Value Ref Range Status   SARS Coronavirus 2 NEGATIVE NEGATIVE Final    Comment: (NOTE) SARS-CoV-2 target nucleic acids are NOT DETECTED.  The SARS-CoV-2 RNA is generally detectable in upper and lower respiratory specimens during the acute phase of infection. Negative results do not preclude SARS-CoV-2 infection, do not rule out co-infections with other pathogens, and should not be used as the sole basis for treatment or other patient management decisions. Negative results must be combined with clinical observations, patient history, and epidemiological information. The expected result is Negative.  Fact Sheet for Patients: HairSlick.no  Fact Sheet for Healthcare Providers: quierodirigir.com  This test is not yet approved or cleared by the [place] FDA and  has been authorized for detection and/or diagnosis of SARS-CoV-2 by FDA under an Emergency Use Authorization (EUA). This EUA will remain  in effect (meaning this test can be used) for the duration of the COVID-19 declaration under Se ction 564(b)(1) of the Act, 21 U.S.C. section 360bbb-3(b)(1), unless the authorization is  terminated or revoked sooner.  Performed at [facility], [address], [place], [place] [address]   Respiratory (~20 pathogens) panel by PCR     Status: None   Collection Time: 12/26/20 10:17 AM   Specimen: Nasopharyngeal Swab; Respiratory  Result Value Ref Range Status   Adenovirus NOT DETECTED NOT DETECTED Final   Coronavirus 229E NOT DETECTED NOT DETECTED Final    Comment: (NOTE) The Coronavirus on the Respiratory Panel, DOES NOT test for the novel  Coronavirus (2019 nCoV)    Coronavirus HKU1 NOT DETECTED NOT DETECTED Final   Coronavirus NL63 NOT DETECTED NOT DETECTED Final   Coronavirus OC43 NOT DETECTED NOT DETECTED Final   Metapneumovirus NOT DETECTED NOT DETECTED Final   Rhinovirus / Enterovirus NOT DETECTED NOT DETECTED Final   Influenza A NOT DETECTED NOT DETECTED Final   Influenza B NOT DETECTED NOT DETECTED Final   Parainfluenza Virus 1 NOT DETECTED NOT DETECTED Final   Parainfluenza Virus 2 NOT DETECTED NOT DETECTED Final   Parainfluenza Virus 3 NOT DETECTED NOT DETECTED Final   Parainfluenza Virus 4 NOT DETECTED NOT DETECTED Final   Respiratory Syncytial Virus NOT DETECTED NOT DETECTED Final  Bordetella pertussis NOT DETECTED NOT DETECTED Final   Bordetella Parapertussis NOT DETECTED NOT DETECTED Final   Chlamydophila pneumoniae NOT DETECTED NOT DETECTED Final   Mycoplasma pneumoniae NOT DETECTED NOT DETECTED Final    Comment: Performed at [facility], [address] [place], [place] [address]    Radiology Studies: No results found.     Marlin CanaryJessica Davontay Watlington DO [facility]  If 7PM-7AM, please contact night-coverage [email or web address] 12/28/2020, 1:02 PM

## 2020-12-28 NOTE — Progress Notes (Addendum)
Date of Admission:  12/23/2020      ID: Wendy Nelson is a 55 y.o. female  Active Problems:   Cervical spondylosis with myelopathy and radiculopathy   Spinal cord infarction [facility])   Depression with anxiety   Neurogenic bladder   Sepsis ([place])   Daughter at bedside Subjective: Pt says she is feeling okay Continues to have fever but she does not feel hot No headache, no sweating, no chills Minimal cough, no sob, no pain abdomen  Medications:   acamprosate  333 mg Oral TID   vitamin C  500 mg Oral BID   aspirin EC  325 mg Oral Daily   atorvastatin  10 mg Oral Daily   Chlorhexidine Gluconate Cloth  6 each Topical Daily   dicyclomine  10 mg Oral TID WC   DULoxetine  60 mg Oral Daily   enoxaparin (LOVENOX) injection  60 mg Subcutaneous Q24H   multivitamin with minerals  1 tablet Oral Daily   nystatin   Topical BID   pantoprazole  80 mg Oral Daily   polyethylene glycol  17 g Oral BID   pregabalin  100 mg Oral BID   tiZANidine  2 mg Oral BID    Objective: Vital signs in last 24 hours: Temp:  [99.8 F (37.7 C)-102.9 F (39.4 C)] 100.7 F (38.2 C) (06/15 1144) Pulse Rate:  [90-104] 91 (06/15 1144) Resp:  [18-20] 18 (06/15 1144) BP: (104-135)/(62-81) 104/64 (06/15 1144) SpO2:  [93 %-100 %] 94 % (06/15 1144)  PHYSICAL EXAM:  General: Alert, cooperative, no distress, appears stated age.  Head: Normocephalic, without obvious abnormality, atraumatic. Eyes: Conjunctivae clear, anicteric sclerae. Pupils are equal ENT Nares normal. No drainage or sinus tenderness. Lips, mucosa, and tongue normal. No Thrush Neck: cervical scar- healed well , symmetrical, no adenopathy, thyroid: non tender no carotid bruit and no JVD. Back: No CVA tenderness. Lungs: Clear to auscultation bilaterally. No Wheezing or Rhonchi. No rales. Heart: Regular rate and rhythm, no murmur, rub or gallop. Abdomen: Soft, non-tender,not distended. Bowel sounds normal. No masses foley Extremities: atraumatic,  no cyanosis. No edema. No clubbing Skin: No rashes or lesions. Or bruising Lymph: Cervical, supraclavicular normal. Neurologic: quadriparesis Lab Results Recent Labs    12/26/20 1541 12/28/20 0529  WBC 8.5 13.2*  HGB 11.0* 10.9*  HCT 33.7* 32.8*  NA 135 132*  K 4.7 4.4  CL 97* 94*  CO2 32 28  BUN 13 14  CREATININE 0.79 0.74   Liver Panel Recent Labs    12/26/20 1541 12/28/20 0529  PROT  --  7.0  ALBUMIN 2.7* 2.9*  2.9*  AST  --  27  ALT  --  20  ALKPHOS  --  100  BILITOT  --  0.5  BILIDIR  --  <0.1  IBILI  --  NOT CALCULATED   Sedimentation Rate Recent Labs    12/26/20 0859  ESRSEDRATE 115*   C-Reactive Protein Recent Labs    12/26/20 0859  CRP 12.5*    Microbiology:  Studies/Results: US Venous Img Lower Bilateral (DVT)  Result Date: 12/26/2020 CLINICAL DATA:  Elevated D-dimer EXAM: BILATERAL LOWER EXTREMITY VENOUS DOPPLER ULTRASOUND TECHNIQUE: Gray-scale sonography with compression, as well as color and duplex ultrasound, were performed to evaluate the deep venous system(s) from the level of the common femoral vein through the popliteal and proximal calf veins. COMPARISON:  None. FINDINGS: VENOUS Normal compressibility of the common femoral, superficial femoral, and popliteal veins, as well as the visualized calf veins. Visualized  portions of profunda femoral vein and great saphenous vein unremarkable. No filling defects to suggest DVT on grayscale or color Doppler imaging. Doppler waveforms show normal direction of venous flow, normal respiratory plasticity and response to augmentation. OTHER None. Limitations: none IMPRESSION: Negative. Electronically Signed   By: Rolm Baptise M.D.   On: 12/26/2020 22:46     Assessment/Plan: FUO Initially it was thought to be due to UTI from proteus and has been on appropriate antibiotics for 5 days with fevers persisting. No pneumonia No cellulitis No SSI  SARS cov2 negative No intraabdominal source No diarrhea ( she  is constipated) DVT screen neg Will DC cefazolin today Unclear source for fever- has high ESR and CRP ?? 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

## 2020-12-28 NOTE — Progress Notes (Addendum)
   12/28/20 0435  Assess: MEWS Score  Temp (!) 101.7 F (38.7 C)  BP 135/62  Pulse Rate 92  Resp 18  SpO2 93 %  Assess: MEWS Score  MEWS Temp 2  MEWS Systolic 0  MEWS Pulse 0  MEWS RR 0  MEWS LOC 0  MEWS Score 2  MEWS Score Color Yellow  Treat  MEWS Interventions Administered prn meds/treatments  Take Vital Signs  Increase Vital Sign Frequency  Yellow: Q 2hr X 2 then Q 4hr X 2, if remains yellow, continue Q 4hrs  Escalate  MEWS: Escalate Yellow: discuss with charge nurse/RN and consider discussing with provider and RRT  Notify: Charge Nurse/RN  Name of Charge Nurse/RN Notified [place], RN  Date Charge Nurse/RN Notified 12/28/20  Time Charge Nurse/RN Notified 0440  Notify: Provider  Provider Name/Title Lindajo Royal, MD  Date Provider Notified 12/28/20  Time Provider Notified [phone]  Notification Type Page  Notification Reason Other (Comment) (temp increased after intervention)  Provider response No new orders   Gave 650 mg tylenol and pt's temp increased to 102.7.

## 2020-12-28 NOTE — Progress Notes (Signed)
   12/28/20 1655  Clinical Encounter Type  Visited With Patient  Visit Type Initial;Other (Comment) (rapid response)  Referral From Other (Comment) (rapis response)  Spiritual Encounters  Spiritual Needs Emotional  Chaplain Burris responded to a rapid response. When PT available chaplain made her presence known to PT and checked-in regarding PT well-being. Chaplain Burris validated emotions and offered compassionate presence. PT may benefit from follow-up care for emotional support.

## 2020-12-28 NOTE — TOC Progression Note (Addendum)
Transition of Care [facility]) - Progression Note    Patient Details  Name: Kathlynn Swofford MRN: [id number] Date of Birth: [birth year]  Transition of Care [facility]) CM/SW Contact  Margarito Liner, LCSW Phone Number: 12/28/2020, 3:05 PM  Clinical Narrative:  Patient's children working on getting her into [facility]. Daughter said her brother has been in contact with Asher Muir, an admissions coordinator at the facility. Left voicemail on intake line. Daughter said patient has been at [facility] since March/April but is not a long-term resident. Plan is to return home after rehab.  3:18 pm: Received call back from [place] at [facility]. She is unsure if patient will qualify for rehab at their center. Also concern for caregiver support after discharge. Faxed referral for her to review.  Expected Discharge Plan: Skilled Nursing Facility Barriers to Discharge: Continued Medical Work up  Expected Discharge Plan and Services Expected Discharge Plan: Skilled Nursing Facility   Discharge Planning Services: CM Consult Post Acute Care Choice: Skilled Nursing Facility Living arrangements for the past 2 months: Skilled Nursing Facility                 DME Arranged: N/A DME Agency: NA       HH Arranged: NA           Social Determinants of Health (SDOH) Interventions    Readmission Risk Interventions No flowsheet data found.

## 2020-12-28 NOTE — Progress Notes (Signed)
Rapid Response Event Note   Reason for Call :  [place]: Increase in temp to 101.5 and increase in RR.   Initial Focused Assessment:  Pt resting in bed, alert and oriented X4. Pt has temp of 101.5 on arrival. All other VSS. O2 sats 96% on room air and no signs of distress. Denies pain at this pain.    Interventions: Current order from Dr. Rivka Safer to order blood cultures if temp over 100.6.  This order was placed by primary RN.  MD notified.   Plan of Care:  Blood cultures ordered. Pt will remain on med-surg at this time and I will continue to follow up.     Event Summary: Pt is resting quietly in bed, no s/s of distress noted. Elevated temp but all other VSS. Blood cultures ordered per ID MD.   Follow-up: Followed up with primary RN at 5:56 pm. Pt's temp is now 102.8. Fluids started and ordered antibiotics started. VS remain stable at follow up.  MD Notified: Dr. Benjamine Mola Call Time: 4:48 pm Arrival Time: 4:50 pm End Time: 5:03 pm  Henrene Dodge, RN

## 2020-12-28 NOTE — Progress Notes (Signed)
Pharmacy Antibiotic Note  Wendy Nelson is a 55 y.o. 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 Pt received a one time dose of ceftriaxone and was then switched to cefepime. Cefepime was then de-escalated to cefazolin for proteus mirabilis in the urine. Pt was on antibiotics for 5 days with fevers persisting; wBC 8.5>13.2. Pharmacy has been consulted for cefepime and vancomycin dosing for possible wound infection- TRH discussed with ID.  Plan: Cefepime 2 g IV q8h Vancomycin 2000 mg IV x 1 loading dose followed by 1000 mg IV q12 h Est AUC: 528 Est Cpk: 31.7 Est Ctr: 15.5 Obtain vancomycin levels around 4th or 5th dose if continued Monitor renal function and adjust antibiotics as clinically indicated   Height: 5\' 5"  (165.1 cm) Weight: 116.3 kg (256 lb 6.3 oz) IBW/kg (Calculated) : 57  Temp (24hrs), Avg:101.4 F (38.6 C), Min:99.8 F (37.7 C), Max:102.9 F (39.4 C)  Recent Labs  Lab 12/23/20 1025 12/24/20 0532 12/25/20 0719 12/26/20 1541 12/28/20 0529  WBC 11.6* 8.0 8.5 8.5 13.2*  CREATININE 0.72 0.72  --  0.79 0.74  LATICACIDVEN 0.8  --   --   --   --     Estimated Creatinine Clearance: 103.6 mL/min (by C-G formula based on SCr of 0.74 mg/dL).    Allergies  Allergen Reactions   Melatonin    Neomycin Other (See Comments)    Positive patch test   Sulfa Antibiotics     Antimicrobials this admission: 6/10 Ceftriaxone x1 6/10 Cefepime >> 6/12; 6/15 >> 6/12 Cefazolin >> 6/15 6/15 Vancomycin >>  Dose adjustments this admission: N/A  Microbiology results: 6/10 BCx: NGTD 6/10 UCx: 10,000 colonies Proteus mirabilis - R to nitrofurantoin  Repeat Bcx sent   Thank you for allowing pharmacy to be a part of this patient's care.  8/10, PharmD Pharmacy Resident   12/28/2020 5:32 PM

## 2020-12-28 NOTE — Progress Notes (Signed)
   12/28/20 1642  Assess: MEWS Score  Temp (!) 102.8 F (39.3 C)  BP 124/72  Pulse Rate 88  Resp (!) 28  Level of Consciousness Alert  SpO2 96 %  O2 Device Room Air  Assess: if the MEWS score is Yellow or Red  Were vital signs taken at a resting state? Yes  Focused Assessment Change from prior assessment (see assessment flowsheet)  Early Detection of Sepsis Score *See Row Information* High  MEWS guidelines implemented *See Row Information* Yes  Treat  MEWS Interventions Administered scheduled meds/treatments  Pain Scale 0-10  Pain Score 0  Take Vital Signs  Increase Vital Sign Frequency  Red: Q 1hr X 4 then Q 4hr X 4, if remains red, continue Q 4hrs  Escalate  MEWS: Escalate Red: discuss with charge nurse/RN and provider, consider discussing with RRT  Notify: Charge Nurse/RN  Name of Charge Nurse/RN Notified Theodosia Quay, RN  Date Charge Nurse/RN Notified 12/28/20  Time Charge Nurse/RN Notified 1644  Notify: Provider  Provider Name/Title Dr. Benjamine Mola (and ID MD)  Date Provider Notified 12/28/20  Time Provider Notified 1653  Notification Type Page  Notification Reason Change in status  Provider response See new orders  Date of Provider Response 12/28/20  Time of Provider Response 1702  Notify: Rapid Response  Name of Rapid Response RN Notified Levonne Spiller, RN  Date Rapid Response Notified 12/28/20  Time Rapid Response Notified 1644  Document  Patient Outcome Other (Comment) (gave PRN meds and giving patient new meds MD ordered)  Progress note created (see row info) Yes

## 2020-12-29 ENCOUNTER — Ambulatory Visit: Payer: Medicare Other | Admitting: Physician Assistant

## 2020-12-29 ENCOUNTER — Inpatient Hospital Stay: Payer: Medicare Other

## 2020-12-29 DIAGNOSIS — G9511 Acute infarction of spinal cord (embolic) (nonembolic): Secondary | ICD-10-CM | POA: Diagnosis not present

## 2020-12-29 DIAGNOSIS — A419 Sepsis, unspecified organism: Secondary | ICD-10-CM | POA: Diagnosis not present

## 2020-12-29 DIAGNOSIS — R509 Fever, unspecified: Secondary | ICD-10-CM | POA: Diagnosis not present

## 2020-12-29 LAB — CBC WITH DIFFERENTIAL/PLATELET
Abs Immature Granulocytes: 0.12 10*3/uL — ABNORMAL HIGH (ref 0.00–0.07)
Basophils Absolute: 0.1 10*3/uL (ref 0.0–0.1)
Basophils Relative: 0 %
Eosinophils Absolute: 0.5 10*3/uL (ref 0.0–0.5)
Eosinophils Relative: 4 %
HCT: 32.3 % — ABNORMAL LOW (ref 36.0–46.0)
Hemoglobin: 10.8 g/dL — ABNORMAL LOW (ref 12.0–15.0)
Immature Granulocytes: 1 %
Lymphocytes Relative: 30 %
Lymphs Abs: 3.6 10*3/uL (ref 0.7–4.0)
MCH: 28.5 pg (ref 26.0–34.0)
MCHC: 33.4 g/dL (ref 30.0–36.0)
MCV: 85.2 fL (ref 80.0–100.0)
Monocytes Absolute: 0.8 10*3/uL (ref 0.1–1.0)
Monocytes Relative: 7 %
Neutro Abs: 7 10*3/uL (ref 1.7–7.7)
Neutrophils Relative %: 58 %
Platelets: 451 10*3/uL — ABNORMAL HIGH (ref 150–400)
RBC: 3.79 MIL/uL — ABNORMAL LOW (ref 3.87–5.11)
RDW: 14.7 % (ref 11.5–15.5)
WBC: 12.1 10*3/uL — ABNORMAL HIGH (ref 4.0–10.5)
nRBC: 0 % (ref 0.0–0.2)

## 2020-12-29 LAB — SARS CORONAVIRUS 2 (TAT 6-24 HRS): SARS Coronavirus 2: NEGATIVE

## 2020-12-29 LAB — C-REACTIVE PROTEIN: CRP: 1.6 mg/dL — ABNORMAL HIGH (ref <1.0)

## 2020-12-29 LAB — SEDIMENTATION RATE: Sed Rate: 106 mm/hr — ABNORMAL HIGH (ref 0–30)

## 2020-12-29 MED ORDER — SODIUM CHLORIDE 0.9 % IV BOLUS
500.0000 mL | Freq: Once | INTRAVENOUS | Status: AC
  Administered 2020-12-29: 01:00:00 500 mL via INTRAVENOUS

## 2020-12-29 MED ORDER — SODIUM CHLORIDE 0.9 % IV SOLN: INTRAVENOUS | Status: AC

## 2020-12-29 MED ORDER — VANCOMYCIN HCL IN DEXTROSE 1-5 GM/200ML-% IV SOLN
1000.0000 mg | Freq: Two times a day (BID) | INTRAVENOUS | Status: DC
  Administered 2020-12-29 – 2020-12-30 (×2): 1000 mg via INTRAVENOUS
  Filled 2020-12-29 (×4): qty 200

## 2020-12-29 MED ORDER — IOHEXOL 350 MG/ML SOLN
75.0000 mL | Freq: Once | INTRAVENOUS | Status: AC | PRN
  Administered 2020-12-29: 15:00:00 75 mL via INTRAVENOUS

## 2020-12-29 NOTE — Progress Notes (Addendum)
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  Reportedly febrile to 103 at nursing facility and started on Keflex for presumed UTI.  In ED, mild temp to 100.3.  Tachycardic to 107.  Tachypnea low 20s.  Leukocytosis to 11.6.  Hgb 10.9.  Otherwise, CBC and CMP without significant finding.  UA with moderate LE and rare bacteria.  CXR with mild cardiomegaly.  CT abdomen and pelvis w/o contrast with decompressed bladder but possible bladder wall thickening suggestive for cystitis.  Cultures obtained.  Patient was started on IV ceftriaxone.  Antibiotics escalated to IV cefepime on admission.  Urine culture with 10,000 colonies of Proteus Mirabilis.  Antibiotics de-escalated to IV Ancef.  However, patient continues to spike fever despite appropriate antibiotics.  No other obvious source of infection or leukocytosis.  D-dimer elevated but LE [place] negative for DVT.  Inflammatory markers elevated.  Concern about autonomic dysregulation.  Infectious disease consulted.  Patient being pan-scanned and rechecked for COVID   Subjective: C/o being cold   Objective: Vitals:   12/29/20 0014 12/29/20 0225 12/29/20 0341 12/29/20 0847  BP: (!) 86/62 105/62 123/87 126/75  Pulse: 87 78 71 75  Resp: (!) 22  (!) 22 20  Temp: 100.1 F (37.8 C)  (!) 100.9 F (38.3 C) 98.9 F (37.2 C)  TempSrc: Oral  Oral Oral  SpO2: (!) 89%  100% 100%  Weight:      Height:        Intake/Output Summary (Last 24 hours) at 12/29/2020 0853 Last data filed at 12/29/2020 0341 Gross per 24 hour  Intake 1279.16 ml  Output 2800  ml  Net -1520.84 ml   Filed Weights   12/23/20 1610  Weight: 116.3 kg    Examination:   General: Appearance:    Obese female who appears uncomfortable     Lungs:     On [place], respirations unlabored, diminished  Heart:    Normal heart rate.    MS:   All extremities are intact.    Neurologic:   Awake, alert, oriented x 3. paraplegic       Procedures:  None  Microbiology summarized: COVID-19 PCR negative. Blood cultures NGTD. Urine culture with 10,000 colonies Proteus mirabilis. RVP panel negative.  Assessment & Plan: Sepsis thought due to catheter associated UTI in patient with chronic indwelling Foley plus FUO -started on Keflex outpatient.  UA with moderate LE and rare bacteria.  Culture data as above.  Continues to spike fever despite appropriate antibiotic.  -Foley replaced on admission. -restarted abx 6/15 -blood cultures -6/16: pan-scan- will start with CT scan chest/abd/pelvis and if nothing revealed with get MRI w and w/o contrast of spine (discussed with rads) -recheck COVID -ID consult appreaciated   BLE diplegia/spinal cord injury/chronic pain -Continue home pain medications-Lyrica, tizanidine, Mobic and as needed oxy  Anxiety/depression: Stable. -Continue home Cymbalta -Discontinue trazodone out of concern for serotonin syndrome.  Neuropathy-continue Lyrica  History of CVA: -Continue statin and aspirin.  Morbid obesity Body mass index is 42.67 kg/m.         DVT prophylaxis:    Subcu Lovenox  Code Status: Full code  Level of care: Med-Surg Status is: Inpatient  Remains inpatient appropriate because:IV treatments appropriate due to intensity of illness or inability to take PO and Inpatient level of care appropriate due to severity of illness  Dispo: The patient is from:  LTF              Anticipated d/c is to: prior facility with PT?              Patient currently is not medically stable to d/c.   Difficult to place patient  No    Consultants:  Infectious disease  Sch Meds:  Scheduled Meds:  acamprosate  333 mg Oral TID   vitamin C  500 mg Oral BID   aspirin EC  325 mg Oral Daily   atorvastatin  10 mg Oral Daily   Chlorhexidine Gluconate Cloth  6 each Topical Daily   dicyclomine  10 mg Oral TID WC   DULoxetine  60 mg Oral Daily   enoxaparin (LOVENOX) injection  60 mg Subcutaneous Q24H   multivitamin with minerals  1 tablet Oral Daily   nystatin   Topical BID   pantoprazole  80 mg Oral Daily   polyethylene glycol  17 g Oral BID   pregabalin  100 mg Oral BID   tiZANidine  2 mg Oral BID   Continuous Infusions:  sodium chloride Stopped (12/27/20 0935)   sodium chloride 100 mL/hr at 12/29/20 0828   ceFEPime (MAXIPIME) IV 2 g (12/29/20 0615)   vancomycin 1,000 mg (12/29/20 0647)   PRN Meds:.sodium chloride, acetaminophen **OR** acetaminophen, bisacodyl, ibuprofen, methocarbamol, ondansetron **OR** ondansetron (ZOFRAN) IV, oxyCODONE  Antimicrobials: Anti-infectives (From admission, onward)    Start     Dose/Rate Route Frequency Ordered Stop   12/29/20 0600  vancomycin (VANCOREADY) IVPB 1000 mg/200 mL        1,000 mg 200 mL/hr over 60 Minutes Intravenous Every 12 hours 12/28/20 1728     12/28/20 1900  ceFEPIme (MAXIPIME) 2 g in sodium chloride 0.9 % 100 mL IVPB        2 g 200 mL/hr over 30 Minutes Intravenous Every 8 hours 12/28/20 1727     12/28/20 1815  vancomycin (VANCOREADY) IVPB 2000 mg/400 mL        2,000 mg 200 mL/hr over 120 Minutes Intravenous  Once 12/28/20 1727 12/29/20 0351   12/26/20 2200  ceFAZolin (ANCEF) 1 g in sodium chloride 0.9 % 100 mL IVPB  Status:  Discontinued        1 g 200 mL/hr over 30 Minutes Intravenous Every 8 hours 12/26/20 1550 12/28/20 0839   12/25/20 1400  ceFAZolin (ANCEF) IVPB 1 g/50 mL premix  Status:  Discontinued        1 g 100 mL/hr over 30 Minutes Intravenous Every 8 hours 12/25/20 1214 12/26/20 1550   12/24/20 0500  ceFEPIme (MAXIPIME) 2 g in sodium  chloride 0.9 % 100 mL IVPB  Status:  Discontinued        2 g 200 mL/hr over 30 Minutes Intravenous Every 12 hours 12/23/20 1504 12/23/20 1619   12/23/20 1700  ceFEPIme (MAXIPIME) 2 g in sodium chloride 0.9 % 100 mL IVPB  Status:  Discontinued        2 g 200 mL/hr over 30 Minutes Intravenous Every 8 hours 12/23/20 1619 12/25/20 1214   12/23/20 1600  ceFEPIme (MAXIPIME) 2 g in sodium chloride 0.9 % 100 mL IVPB  Status:  Discontinued        2 g 200  mL/hr over 30 Minutes Intravenous  Once 12/23/20 1501 12/23/20 1619   12/23/20 1045  cefTRIAXone (ROCEPHIN) 1 g in sodium chloride 0.9 % 100 mL IVPB        1 g 200 mL/hr over 30 Minutes Intravenous  Once 12/23/20 1035 12/23/20 1230        I have personally reviewed the following labs and images: CBC: Recent Labs  Lab 12/23/20 1025 12/24/20 0532 12/25/20 0719 12/26/20 1541 12/28/20 0529 12/29/20 0541  WBC 11.6* 8.0 8.5 8.5 13.2* 12.1*  NEUTROABS 7.2  --  3.8  --   --  7.0  HGB 10.9* 10.6* 10.6* 11.0* 10.9* 10.8*  HCT 32.7* 32.5* 32.5* 33.7* 32.8* 32.3*  MCV 86.1 85.8 85.8 85.5 84.5 85.2  PLT 233 231 273 325 452* 451*   BMP &GFR Recent Labs  Lab 12/23/20 1025 12/24/20 0532 12/26/20 1541 12/28/20 0529  NA 135 140 135 132*  K 4.1 4.1 4.7 4.4  CL 100 104 97* 94*  CO2 25 29 32 28  GLUCOSE 101* 77 137* 96  BUN 14 14 13 14   CREATININE 0.72 0.72 0.79 0.74  CALCIUM 8.9 9.0 9.1 9.0  MG  --   --  1.9 1.9  PHOS  --   --  3.8 3.6   Estimated Creatinine Clearance: 103.6 mL/min (by C-G formula based on SCr of 0.74 mg/dL). Liver & Pancreas: Recent Labs  Lab 12/23/20 1025 12/26/20 1541 12/28/20 0529  AST 23  --  27  ALT 39  --  20  ALKPHOS 134*  --  100  BILITOT 0.9  --  0.5  PROT 7.0  --  7.0  ALBUMIN 3.0* 2.7* 2.9*  2.9*   No results for input(s): LIPASE, AMYLASE in the last 168 hours. No results for input(s): AMMONIA in the last 168 hours. Diabetic: No results for input(s): HGBA1C in the last 72 hours. No results for  input(s): GLUCAP in the last 168 hours. Cardiac Enzymes: No results for input(s): CKTOTAL, CKMB, CKMBINDEX, TROPONINI in the last 168 hours. No results for input(s): PROBNP in the last 8760 hours. Coagulation Profile: Recent Labs  Lab 12/23/20 1025  INR 1.2   Thyroid Function Tests: No results for input(s): TSH, T4TOTAL, FREET4, T3FREE, THYROIDAB in the last 72 hours. Lipid Profile: No results for input(s): CHOL, HDL, LDLCALC, TRIG, CHOLHDL, LDLDIRECT in the last 72 hours. Anemia Panel: Recent Labs    12/28/20 0529  FERRITIN 272   Urine analysis:    Component Value Date/Time   COLORURINE STRAW (A) 12/23/2020 1026   APPEARANCEUR CLEAR (A) 12/23/2020 1026   LABSPEC 1.003 (L) 12/23/2020 1026   PHURINE 7.0 12/23/2020 1026   GLUCOSEU NEGATIVE 12/23/2020 1026   HGBUR NEGATIVE 12/23/2020 1026   BILIRUBINUR NEGATIVE 12/23/2020 1026   KETONESUR NEGATIVE 12/23/2020 1026   PROTEINUR NEGATIVE 12/23/2020 1026   NITRITE NEGATIVE 12/23/2020 1026   LEUKOCYTESUR MODERATE (A) 12/23/2020 1026   Sepsis Labs: Invalid input(s): PROCALCITONIN, LACTICIDVEN  Microbiology: Recent Results (from the past 240 hour(s))  Culture, blood (Routine x 2)     Status: None   Collection Time: 12/23/20 10:25 AM   Specimen: BLOOD LEFT HAND  Result Value Ref Range Status   Specimen Description BLOOD LEFT HAND  Final   Special Requests   Final    BOTTLES DRAWN AEROBIC AND ANAEROBIC Blood Culture adequate volume   Culture   Final    NO GROWTH 5 DAYS Performed at [facility], [address] [place], [place]  [address]    Report Status 12/28/2020 FINAL  Final  Culture, blood (Routine x 2)     Status: None   Collection Time: 12/23/20 10:30 AM   Specimen: Right Antecubital; Blood  Result Value Ref Range Status   Specimen Description RIGHT ANTECUBITAL  Final   Special Requests   Final    BOTTLES DRAWN AEROBIC AND ANAEROBIC Blood Culture adequate volume   Culture   Final    NO GROWTH 5  DAYS Performed at [facility], [address] [place], [place] [address]    Report Status 12/28/2020 FINAL  Final  Urine culture     Status: Abnormal   Collection Time: 12/23/20 10:36 AM   Specimen: Urine, Random  Result Value Ref Range Status   Specimen Description   Final    URINE, RANDOM Performed at [facility], [address], [place], [place] [address]    Special Requests   Final    NONE Performed at [facility], [address], [place], [place] [address]    Culture 10,000 COLONIES/mL PROTEUS MIRABILIS (A)  Final   Report Status 12/25/2020 FINAL  Final   Organism ID, Bacteria PROTEUS MIRABILIS (A)  Final      Susceptibility   Proteus mirabilis - MIC*    AMPICILLIN <=2 SENSITIVE Sensitive     CEFAZOLIN <=4 SENSITIVE Sensitive     CEFEPIME <=0.12 SENSITIVE Sensitive     CEFTRIAXONE <=0.25 SENSITIVE Sensitive     CIPROFLOXACIN <=0.25 SENSITIVE Sensitive     GENTAMICIN <=1 SENSITIVE Sensitive     IMIPENEM 1 SENSITIVE Sensitive     NITROFURANTOIN 128 RESISTANT Resistant     TRIMETH/SULFA <=20 SENSITIVE Sensitive     AMPICILLIN/SULBACTAM <=2 SENSITIVE Sensitive     PIP/TAZO <=4 SENSITIVE Sensitive     * 10,000 COLONIES/mL PROTEUS MIRABILIS  SARS CORONAVIRUS 2 (TAT 6-24 HRS) Nasopharyngeal Nasopharyngeal Swab     Status: None   Collection Time: 12/23/20  1:20 PM   Specimen: Nasopharyngeal Swab  Result Value Ref Range Status   SARS Coronavirus 2 NEGATIVE NEGATIVE Final    Comment: (NOTE) SARS-CoV-2 target nucleic acids are NOT DETECTED.  The SARS-CoV-2 RNA is generally detectable in upper and lower respiratory specimens during the acute phase of infection. Negative results do not preclude SARS-CoV-2 infection, do not rule out co-infections with other pathogens, and should not be used as the sole basis for treatment or other patient management decisions. Negative results must be combined with clinical observations, patient history, and  epidemiological information. The expected result is Negative.  Fact Sheet for Patients: HairSlick.no  Fact Sheet for Healthcare Providers: quierodirigir.com  This test is not yet approved or cleared by the [place] FDA and  has been authorized for detection and/or diagnosis of SARS-CoV-2 by FDA under an Emergency Use Authorization (EUA). This EUA will remain  in effect (meaning this test can be used) for the duration of the COVID-19 declaration under Se ction 564(b)(1) of the Act, 21 U.S.C. section 360bbb-3(b)(1), unless the authorization is terminated or revoked sooner.  Performed at [facility], [address] [place], [place] [address]   Respiratory (~20 pathogens) panel by PCR     Status: None   Collection Time: 12/26/20 10:17 AM   Specimen: Nasopharyngeal Swab; Respiratory  Result Value Ref Range Status   Adenovirus NOT DETECTED NOT DETECTED Final   Coronavirus 229E NOT DETECTED NOT DETECTED Final    Comment: (NOTE) The Coronavirus on the Respiratory Panel, DOES  NOT test for the novel  Coronavirus (2019 nCoV)    Coronavirus HKU1 NOT DETECTED NOT DETECTED Final   Coronavirus NL63 NOT DETECTED NOT DETECTED Final   Coronavirus OC43 NOT DETECTED NOT DETECTED Final   Metapneumovirus NOT DETECTED NOT DETECTED Final   Rhinovirus / Enterovirus NOT DETECTED NOT DETECTED Final   Influenza A NOT DETECTED NOT DETECTED Final   Influenza B NOT DETECTED NOT DETECTED Final   Parainfluenza Virus 1 NOT DETECTED NOT DETECTED Final   Parainfluenza Virus 2 NOT DETECTED NOT DETECTED Final   Parainfluenza Virus 3 NOT DETECTED NOT DETECTED Final   Parainfluenza Virus 4 NOT DETECTED NOT DETECTED Final   Respiratory Syncytial Virus NOT DETECTED NOT DETECTED Final   Bordetella pertussis NOT DETECTED NOT DETECTED Final   Bordetella Parapertussis NOT DETECTED NOT DETECTED Final   Chlamydophila pneumoniae NOT DETECTED NOT DETECTED  Final   Mycoplasma pneumoniae NOT DETECTED NOT DETECTED Final    Comment: Performed at [facility], [address] [place], [place] [address]  CULTURE, BLOOD (ROUTINE X 2) w Reflex to ID Panel     Status: None (Preliminary result)   Collection Time: 12/28/20  6:14 PM   Specimen: BLOOD  Result Value Ref Range Status   Specimen Description BLOOD BLOOD LEFT HAND  Final   Special Requests   Final    BOTTLES DRAWN AEROBIC AND ANAEROBIC Blood Culture adequate volume   Culture   Final    NO GROWTH < 24 HOURS Performed at [facility], [address] [place], [place] [address]    Report Status PENDING  Incomplete  CULTURE, BLOOD (ROUTINE X 2) w Reflex to ID Panel     Status: None (Preliminary result)   Collection Time: 12/28/20  6:14 PM   Specimen: BLOOD  Result Value Ref Range Status   Specimen Description BLOOD BLOOD RIGHT HAND  Final   Special Requests   Final    BOTTLES DRAWN AEROBIC AND ANAEROBIC Blood Culture results may not be optimal due to an inadequate volume of blood received in culture bottles   Culture   Final    NO GROWTH < 24 HOURS Performed at [facility], [address] [place], [place] [address]    Report Status PENDING  Incomplete    Radiology Studies: No results found.     Marlin CanaryJessica Rodolfo Notaro DO [facility]  If 7PM-7AM, please contact night-coverage [email or web address] 12/29/2020, 8:53 AM

## 2020-12-29 NOTE — Progress Notes (Signed)
Date of Admission:  12/23/2020     ID: Wendy Nelson is a 55 y.o. female  Active Problems:   Cervical spondylosis with myelopathy and radiculopathy   Spinal cord infarction [facility])   Depression with anxiety   Neurogenic bladder   Sepsis ([place])    Subjective: Awake and alert Says she is feeling better No fever   Medications:   acamprosate  333 mg Oral TID   vitamin C  500 mg Oral BID   aspirin EC  325 mg Oral Daily   atorvastatin  10 mg Oral Daily   Chlorhexidine Gluconate Cloth  6 each Topical Daily   dicyclomine  10 mg Oral TID WC   DULoxetine  60 mg Oral Daily   enoxaparin (LOVENOX) injection  60 mg Subcutaneous Q24H   multivitamin with minerals  1 tablet Oral Daily   nystatin   Topical BID   pantoprazole  80 mg Oral Daily   polyethylene glycol  17 g Oral BID   pregabalin  100 mg Oral BID   tiZANidine  2 mg Oral BID    Objective: Vital signs in last 24 hours: Temp:  [97.9 F (36.6 C)-101.3 F (38.5 C)] 98.1 F (36.7 C) (06/16 1708) Pulse Rate:  [65-97] 85 (06/16 1708) Resp:  [18-26] 20 (06/16 1708) BP: (86-128)/(62-87) 105/65 (06/16 1708) SpO2:  [89 %-100 %] 100 % (06/16 1708)  PHYSICAL EXAM:  General: Alert, cooperative, no distress, appears stated age.  Lungs: Clear to auscultation bilaterally. No Wheezing or Rhonchi. No rales. Heart: Regular rate and rhythm, no murmur, rub or gallop. Abdomen: Soft, non-tender,not distended. Bowel sounds normal. No masses Extremities: atraumatic, no cyanosis. No edema. No clubbing Skin: No rashes or lesions. Or bruising Lymph: Cervical, supraclavicular normal. Neurologic: quadriparesis  Lab Results Recent Labs    12/28/20 0529 12/29/20 0541  WBC 13.2* 12.1*  HGB 10.9* 10.8*  HCT 32.8* 32.3*  NA 132*  --   K 4.4  --   CL 94*  --   CO2 28  --   BUN 14  --   CREATININE 0.74  --    Liver Panel Recent Labs    12/28/20 0529  PROT 7.0  ALBUMIN 2.9*  2.9*  AST 27  ALT 20  ALKPHOS 100  BILITOT 0.5  BILIDIR  <0.1  IBILI NOT CALCULATED   Sedimentation Rate Recent Labs    12/29/20 0541  ESRSEDRATE 106*   C-Reactive Protein Recent Labs    12/29/20 0541  CRP 1.6*    Microbiology:  Studies/Results: CT Angio Chest Pulmonary Embolism (PE) W or WO Contrast  Result Date: 12/29/2020 CLINICAL DATA:  Sepsis and fever of unknown origin. EXAM: CT ANGIOGRAPHY CHEST CT ABDOMEN AND PELVIS WITH CONTRAST TECHNIQUE: Multidetector CT imaging of the chest was performed using the standard protocol during bolus administration of intravenous contrast. Multiplanar CT image reconstructions and MIPs were obtained to evaluate the vascular anatomy. Multidetector CT imaging of the abdomen and pelvis was performed using the standard protocol during bolus administration of intravenous contrast. CONTRAST:  54m OMNIPAQUE IOHEXOL 350 MG/ML SOLN COMPARISON:  Abdomen/pelvis CT 12/23/2020 FINDINGS: CTA CHEST FINDINGS Cardiovascular: Heart is enlarged. Atherosclerotic calcification is noted in the wall of the thoracic aorta. There is no filling defect within the opacified pulmonary arteries to suggest the presence of an acute pulmonary embolus. Mediastinum/Nodes: No mediastinal lymphadenopathy. There is no hilar lymphadenopathy. The esophagus has normal imaging features. There is no axillary lymphadenopathy. Lungs/Pleura: Subsegmental atelectasis noted dependent lung bases. Superimposed pneumonia  cannot be excluded in the left base (image 65/6). There is no evidence of pleural effusion. Musculoskeletal: No worrisome lytic or sclerotic osseous abnormality. Review of the MIP images confirms the above findings. CT ABDOMEN and PELVIS FINDINGS Hepatobiliary: No suspicious focal abnormality within the liver parenchyma. There is no evidence for gallstones, gallbladder wall thickening, or pericholecystic fluid. No intrahepatic or extrahepatic biliary dilation. Pancreas: No focal mass lesion. No dilatation of the main duct. No intraparenchymal  cyst. No peripancreatic edema. Spleen: No splenomegaly. No focal mass lesion. Adrenals/Urinary Tract: No adrenal nodule or mass. Areas of segmental edema/hypoperfusion noted upper pole right kidney (best seen on delayed images 9 and 10 of series 7. There is also wedge-shaped segmental edema anterior upper left kidney on 12/07 and upper interpolar left kidney on 16/7. No evidence for hydroureter. Bladder decompressed by Foley catheter with gas in the bladder presumably secondary to instrumentation. Stomach/Bowel: Tiny hiatal hernia. Stomach decompressed. Duodenum is normally positioned as is the ligament of Treitz. No small bowel wall thickening. No small bowel dilatation. The terminal ileum is normal. The appendix is normal. No gross colonic mass. No colonic wall thickening. Vascular/Lymphatic: There is abdominal aortic atherosclerosis without aneurysm. There is no gastrohepatic or hepatoduodenal ligament lymphadenopathy. No retroperitoneal or mesenteric lymphadenopathy. No pelvic sidewall lymphadenopathy. Reproductive: The uterus is unremarkable.  There is no adnexal mass. Other: No intraperitoneal free fluid. Musculoskeletal: Scattered foci of gas within the anterior subcutaneous fat of the abdominal wall likely related to injection sites. No worrisome lytic or sclerotic osseous abnormality. Stable changes of sacroiliitis. IMPRESSION: 1. No CT evidence for acute pulmonary embolus. 2. Subsegmental atelectasis in the dependent lung bases. Superimposed pneumonia cannot be excluded in the left base. 3. Bilateral wedge-shaped areas of segmental edema/hypoperfusion in the kidneys, right greater than left. Imaging features compatible with pyelonephritis. 4. Tiny hiatal hernia. 5. Aortic Atherosclerosis (ICD10-I70.0). Electronically Signed   By: Misty Stanley M.D.   On: 12/29/2020 16:03   CT ABDOMEN PELVIS W CONTRAST  Result Date: 12/29/2020 CLINICAL DATA:  Sepsis and fever of unknown origin. EXAM: CT ANGIOGRAPHY  CHEST CT ABDOMEN AND PELVIS WITH CONTRAST TECHNIQUE: Multidetector CT imaging of the chest was performed using the standard protocol during bolus administration of intravenous contrast. Multiplanar CT image reconstructions and MIPs were obtained to evaluate the vascular anatomy. Multidetector CT imaging of the abdomen and pelvis was performed using the standard protocol during bolus administration of intravenous contrast. CONTRAST:  29m OMNIPAQUE IOHEXOL 350 MG/ML SOLN COMPARISON:  Abdomen/pelvis CT 12/23/2020 FINDINGS: CTA CHEST FINDINGS Cardiovascular: Heart is enlarged. Atherosclerotic calcification is noted in the wall of the thoracic aorta. There is no filling defect within the opacified pulmonary arteries to suggest the presence of an acute pulmonary embolus. Mediastinum/Nodes: No mediastinal lymphadenopathy. There is no hilar lymphadenopathy. The esophagus has normal imaging features. There is no axillary lymphadenopathy. Lungs/Pleura: Subsegmental atelectasis noted dependent lung bases. Superimposed pneumonia cannot be excluded in the left base (image 65/6). There is no evidence of pleural effusion. Musculoskeletal: No worrisome lytic or sclerotic osseous abnormality. Review of the MIP images confirms the above findings. CT ABDOMEN and PELVIS FINDINGS Hepatobiliary: No suspicious focal abnormality within the liver parenchyma. There is no evidence for gallstones, gallbladder wall thickening, or pericholecystic fluid. No intrahepatic or extrahepatic biliary dilation. Pancreas: No focal mass lesion. No dilatation of the main duct. No intraparenchymal cyst. No peripancreatic edema. Spleen: No splenomegaly. No focal mass lesion. Adrenals/Urinary Tract: No adrenal nodule or mass. Areas of segmental edema/hypoperfusion noted upper pole  right kidney (best seen on delayed images 9 and 10 of series 7. There is also wedge-shaped segmental edema anterior upper left kidney on 12/07 and upper interpolar left kidney on  16/7. No evidence for hydroureter. Bladder decompressed by Foley catheter with gas in the bladder presumably secondary to instrumentation. Stomach/Bowel: Tiny hiatal hernia. Stomach decompressed. Duodenum is normally positioned as is the ligament of Treitz. No small bowel wall thickening. No small bowel dilatation. The terminal ileum is normal. The appendix is normal. No gross colonic mass. No colonic wall thickening. Vascular/Lymphatic: There is abdominal aortic atherosclerosis without aneurysm. There is no gastrohepatic or hepatoduodenal ligament lymphadenopathy. No retroperitoneal or mesenteric lymphadenopathy. No pelvic sidewall lymphadenopathy. Reproductive: The uterus is unremarkable.  There is no adnexal mass. Other: No intraperitoneal free fluid. Musculoskeletal: Scattered foci of gas within the anterior subcutaneous fat of the abdominal wall likely related to injection sites. No worrisome lytic or sclerotic osseous abnormality. Stable changes of sacroiliitis. IMPRESSION: 1. No CT evidence for acute pulmonary embolus. 2. Subsegmental atelectasis in the dependent lung bases. Superimposed pneumonia cannot be excluded in the left base. 3. Bilateral wedge-shaped areas of segmental edema/hypoperfusion in the kidneys, right greater than left. Imaging features compatible with pyelonephritis. 4. Tiny hiatal hernia. 5. Aortic Atherosclerosis (ICD10-I70.0). Electronically Signed   By: Misty Stanley M.D.   On: 12/29/2020 16:03     Assessment/Plan: FUO Initially it was thought to be due to UTI from proteus and has been on appropriate antibiotics for 5 days with fevers persisting. No pneumonia No cellulitis No SSI SARS cov2 negative No intraabdominal source No diarrhea ( she is constipated) DVT screen neg Unclear source for fever- has high ESR and CRP Because of MEWS she was started on Vanco and cefepime after blood cultures. 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

## 2020-12-30 ENCOUNTER — Inpatient Hospital Stay: Payer: Medicare Other

## 2020-12-30 DIAGNOSIS — N12 Tubulo-interstitial nephritis, not specified as acute or chronic: Secondary | ICD-10-CM

## 2020-12-30 DIAGNOSIS — G9511 Acute infarction of spinal cord (embolic) (nonembolic): Secondary | ICD-10-CM | POA: Diagnosis not present

## 2020-12-30 DIAGNOSIS — N390 Urinary tract infection, site not specified: Secondary | ICD-10-CM | POA: Diagnosis not present

## 2020-12-30 DIAGNOSIS — T83511A Infection and inflammatory reaction due to indwelling urethral catheter, initial encounter: Secondary | ICD-10-CM | POA: Diagnosis not present

## 2020-12-30 DIAGNOSIS — R509 Fever, unspecified: Secondary | ICD-10-CM | POA: Diagnosis not present

## 2020-12-30 LAB — BASIC METABOLIC PANEL
Anion gap: 7 (ref 5–15)
BUN: 14 mg/dL (ref 6–20)
CO2: 24 mmol/L (ref 22–32)
Calcium: 9.1 mg/dL (ref 8.9–10.3)
Chloride: 106 mmol/L (ref 98–111)
Creatinine, Ser: 0.6 mg/dL (ref 0.44–1.00)
GFR, Estimated: >60 mL/min (ref >60)
Glucose, Bld: 92 mg/dL (ref 70–99)
Potassium: 4.5 mmol/L (ref 3.5–5.1)
Sodium: 137 mmol/L (ref 135–145)

## 2020-12-30 LAB — CBC
HCT: 34.1 % — ABNORMAL LOW (ref 36.0–46.0)
Hemoglobin: 10.8 g/dL — ABNORMAL LOW (ref 12.0–15.0)
MCH: 28.3 pg (ref 26.0–34.0)
MCHC: 31.7 g/dL (ref 30.0–36.0)
MCV: 89.3 fL (ref 80.0–100.0)
Platelets: 512 10*3/uL — ABNORMAL HIGH (ref 150–400)
RBC: 3.82 MIL/uL — ABNORMAL LOW (ref 3.87–5.11)
RDW: 14.8 % (ref 11.5–15.5)
WBC: 10.1 10*3/uL (ref 4.0–10.5)
nRBC: 0 % (ref 0.0–0.2)

## 2020-12-30 LAB — ANA W/REFLEX IF POSITIVE: Anti Nuclear Antibody (ANA): NEGATIVE

## 2020-12-30 LAB — MRSA PCR SCREENING: MRSA by PCR: NEGATIVE

## 2020-12-30 MED ORDER — LORAZEPAM 2 MG/ML IJ SOLN: 1.0000 mg | Freq: Once | INTRAMUSCULAR | Status: DC | PRN

## 2020-12-30 MED ORDER — GADOBUTROL 1 MMOL/ML IV SOLN
10.0000 mL | Freq: Once | INTRAVENOUS | Status: AC | PRN
  Administered 2020-12-30: 10 mL via INTRAVENOUS

## 2020-12-30 MED ORDER — VANCOMYCIN HCL 2000 MG/400ML IV SOLN
2000.0000 mg | INTRAVENOUS | Status: DC
  Filled 2020-12-30: qty 400

## 2020-12-30 NOTE — Progress Notes (Signed)
Pharmacy Antibiotic Note  Wendy Nelson is a 55 y.o. 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 Pt received a one time dose of ceftriaxone and was then switched to cefepime. Cefepime was then de-escalated to cefazolin for proteus mirabilis in the urine. Pt was on antibiotics for 5 days with fevers persisting; wBC 8.5>13.2. Pharmacy has been consulted for cefepime and vancomycin dosing for possible wound infection- TRH discussed with ID.  Plan: Cefepime 2 g IV q8h Will adjust Vancomycin to 2000 mg IV q24 h Est AUC: 528 Est Ctr: 10.4 Obtain vancomycin levels around 4th or 5th dose if continued Monitor renal function and adjust antibiotics as clinically indicated   Height: 5\' 5"  (165.1 cm) Weight: 116.3 kg (256 lb 6.3 oz) IBW/kg (Calculated) : 57  Temp (24hrs), Avg:98.4 F (36.9 C), Min:97.8 F (36.6 C), Max:99 F (37.2 C)  Recent Labs  Lab 12/24/20 0532 12/25/20 0719 12/26/20 1541 12/28/20 0529 12/29/20 0541 12/30/20 0425  WBC 8.0 8.5 8.5 13.2* 12.1* 10.1  CREATININE 0.72  --  0.79 0.74  --  0.60     Estimated Creatinine Clearance: 103.6 mL/min (by C-G formula based on SCr of 0.6 mg/dL).    Allergies  Allergen Reactions   Melatonin    Neomycin Other (See Comments)    Positive patch test   Sulfa Antibiotics     Antimicrobials this admission: 6/10 Ceftriaxone x1 6/10 Cefepime >> 6/12; 6/15 >> 6/12 Cefazolin >> 6/15 6/15 Vancomycin >>  Dose adjustments this admission: N/A  Microbiology results: 6/10 BCx: NGF 6/10 UCx: 10,000 colonies Proteus mirabilis - R to nitrofurantoin  Repeat Bcx 6/15: NGTD   Thank you for allowing pharmacy to be a part of this patient's care.  7/15, PharmD Clinical Pharmacist 12/30/2020 2:20 PM

## 2020-12-30 NOTE — Progress Notes (Signed)
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  Reportedly febrile to 103 at nursing facility and started on Keflex for presumed UTI.  In ED, mild temp to 100.3.  Tachycardic to 107.  Tachypnea low 20s.  Leukocytosis to 11.6.  Hgb 10.9.  Otherwise, CBC and CMP without significant finding.  UA with moderate LE and rare bacteria.  CXR with mild cardiomegaly.  CT abdomen and pelvis w/o contrast with decompressed bladder but possible bladder wall thickening suggestive for cystitis.  Cultures obtained.  Patient was started on IV ceftriaxone.  Antibiotics escalated to IV cefepime on admission.  Urine culture with 10,000 colonies of Proteus Mirabilis.  Antibiotics de-escalated to IV Ancef.  However, patient continues to spike fever despite antibiotics.  Fever resolved with IV vanc and cefepime.   Subjective: No chills today   Objective: Vitals:   12/29/20 1951 12/30/20 0100 12/30/20 0458 12/30/20 0812  BP: 98/66 97/65 110/77 133/80  Pulse: 84 84 82 76  Resp: Temp: 97.8 F (36.6 C) 98 F (36.7 C) 98.9 F (37.2 C) 99 F (37.2 C)  TempSrc: Oral  Oral   SpO2: 100% 100% 100% 100%  Weight:      Height:        Intake/Output Summary (Last 24 hours) at 12/30/2020 8119 Last data filed at 12/30/2020 0502 Gross per 24 hour  Intake 1455.71 ml  Output 2675 ml  Net -1219.29 ml   Filed Weights   12/23/20 1610  Weight: 116.3 kg    Examination:  General: Appearance:    Severely obese female in no acute distress     Lungs:     diminished, respirations unlabored  Heart:     Normal heart rate. Normal rhythm. No murmurs, rubs, or gallops.    MS:   All extremities are intact.    Neurologic:   Awake, alert      Microbiology summarized: COVID-19 PCR negative. Blood cultures NGTD. Urine culture with 10,000 colonies Proteus mirabilis. RVP panel negative.  Assessment & Plan: Sepsis thought due to catheter associated UTI in patient with chronic indwelling Foley plus FUO -started on Keflex outpatient.  UA with moderate LE and rare bacteria.  Culture data as above.  Continues to spike fever despite appropriate antibiotic.  -Foley replaced on admission. -restarted abx 6/15 -blood cultures -6/16: pan-scan- will start with CT scan chest/abd/pelvis: ? PNA and ? Pyelo-- per ID will get MRI w and w/o contrast of spine to r/o infection -recheck COVID neative -ID consult appreaciated   BLE diplegia/spinal cord injury/chronic pain -Continue home pain medications-Lyrica, tizanidine, Mobic and as needed oxy  Anxiety/depression: Stable. -Continue home Cymbalta -Discontinue trazodone out of concern for serotonin syndrome.  Neuropathy-continue Lyrica  History of CVA: -Continue statin and aspirin.  Morbid obesity Body mass index is 42.67 kg/m.         DVT prophylaxis:    Subcu Lovenox  Code Status: Full code Level of care: Med-Surg Status is: Inpatient  Remains inpatient appropriate because:IV treatments appropriate due to intensity of illness or inability to take PO and Inpatient level of care appropriate due to severity  of illness  Dispo: The patient is from:  LTF              Anticipated d/c is to: prior facility with PT?              Patient currently is not medically stable to d/c.   Difficult to place patient No    Consultants:  Infectious disease  Sch Meds:  Scheduled Meds:  acamprosate  333 mg Oral TID   vitamin C  500 mg Oral BID   aspirin EC  325 mg Oral Daily   atorvastatin  10 mg Oral Daily   Chlorhexidine Gluconate Cloth  6 each  Topical Daily   dicyclomine  10 mg Oral TID WC   DULoxetine  60 mg Oral Daily   enoxaparin (LOVENOX) injection  60 mg Subcutaneous Q24H   multivitamin with minerals  1 tablet Oral Daily   nystatin   Topical BID   pantoprazole  80 mg Oral Daily   polyethylene glycol  17 g Oral BID   pregabalin  100 mg Oral BID   tiZANidine  2 mg Oral BID   Continuous Infusions:  sodium chloride Stopped (12/27/20 0935)   ceFEPime (MAXIPIME) IV 2 g (12/30/20 0527)   vancomycin 1,000 mg (12/30/20 0609)   PRN Meds:.sodium chloride, acetaminophen **OR** acetaminophen, bisacodyl, ibuprofen, LORazepam, methocarbamol, ondansetron **OR** ondansetron (ZOFRAN) IV, oxyCODONE  Antimicrobials: Anti-infectives (From admission, onward)    Start     Dose/Rate Route Frequency Ordered Stop   12/29/20 1800  vancomycin (VANCOCIN) IVPB 1000 mg/200 mL premix        1,000 mg 200 mL/hr over 60 Minutes Intravenous Every 12 hours 12/29/20 1026     12/29/20 0600  vancomycin (VANCOREADY) IVPB 1000 mg/200 mL  Status:  Discontinued        1,000 mg 200 mL/hr over 60 Minutes Intravenous Every 12 hours 12/28/20 1728 12/29/20 1025   12/28/20 1900  ceFEPIme (MAXIPIME) 2 g in sodium chloride 0.9 % 100 mL IVPB        2 g 200 mL/hr over 30 Minutes Intravenous Every 8 hours 12/28/20 1727     12/28/20 1815  vancomycin (VANCOREADY) IVPB 2000 mg/400 mL        2,000 mg 200 mL/hr over 120 Minutes Intravenous  Once 12/28/20 1727 12/29/20 0351   12/26/20 2200  ceFAZolin (ANCEF) 1 g in sodium chloride 0.9 % 100 mL IVPB  Status:  Discontinued        1 g 200 mL/hr over 30 Minutes Intravenous Every 8 hours 12/26/20 1550 12/28/20 0839   12/25/20 1400  ceFAZolin (ANCEF) IVPB 1 g/50 mL premix  Status:  Discontinued        1 g 100 mL/hr over 30 Minutes Intravenous Every 8 hours 12/25/20 1214 12/26/20 1550   12/24/20 0500  ceFEPIme (MAXIPIME) 2 g in sodium chloride 0.9 % 100 mL IVPB  Status:  Discontinued        2 g 200 mL/hr over 30 Minutes  Intravenous Every 12 hours 12/23/20 1504 12/23/20 1619   12/23/20 1700  ceFEPIme (MAXIPIME) 2 g in sodium chloride 0.9 % 100 mL IVPB  Status:  Discontinued        2 g 200 mL/hr over 30 Minutes Intravenous Every 8 hours 12/23/20 1619 12/25/20 1214   12/23/20 1600  ceFEPIme (MAXIPIME) 2 g in sodium chloride 0.9 % 100 mL IVPB  Status:  Discontinued        2 g 200 mL/hr over 30  Minutes Intravenous  Once 12/23/20 1501 12/23/20 1619   12/23/20 1045  cefTRIAXone (ROCEPHIN) 1 g in sodium chloride 0.9 % 100 mL IVPB        1 g 200 mL/hr over 30 Minutes Intravenous  Once 12/23/20 1035 12/23/20 1230        I have personally reviewed the following labs and images: CBC: Recent Labs  Lab 12/23/20 1025 12/24/20 0532 12/25/20 0719 12/26/20 1541 12/28/20 0529 12/29/20 0541 12/30/20 0425  WBC 11.6*   < > 8.5 8.5 13.2* 12.1* 10.1  NEUTROABS 7.2  --  3.8  --   --  7.0  --   HGB 10.9*   < > 10.6* 11.0* 10.9* 10.8* 10.8*  HCT 32.7*   < > 32.5* 33.7* 32.8* 32.3* 34.1*  MCV 86.1   < > 85.8 85.5 84.5 85.2 89.3  PLT 233   < > 273 325 452* 451* 512*   < > = values in this interval not displayed.   BMP &GFR Recent Labs  Lab 12/23/20 1025 12/24/20 0532 12/26/20 1541 12/28/20 0529 12/30/20 0425  NA 135 140 135 132* 137  K 4.1 4.1 4.7 4.4 4.5  CL 100 104 97* 94* 106  CO2 25 29 32 28 24  GLUCOSE 101* 77 137* 96 92  BUN CREATININE 0.72 0.72 0.79 0.74 0.60  CALCIUM 8.9 9.0 9.1 9.0 9.1  MG  --   --  1.9 1.9  --   PHOS  --   --  3.8 3.6  --    Estimated Creatinine Clearance: 103.6 mL/min (by C-G formula based on SCr of 0.6 mg/dL). Liver & Pancreas: Recent Labs  Lab 12/23/20 1025 12/26/20 1541 12/28/20 0529  AST 23  --  27  ALT 39  --  20  ALKPHOS 134*  --  100  BILITOT 0.9  --  0.5  PROT 7.0  --  7.0  ALBUMIN 3.0* 2.7* 2.9*  2.9*   No results for input(s): LIPASE, AMYLASE in the last 168 hours. No results for input(s): AMMONIA in the last 168 hours. Diabetic: No  results for input(s): HGBA1C in the last 72 hours. No results for input(s): GLUCAP in the last 168 hours. Cardiac Enzymes: No results for input(s): CKTOTAL, CKMB, CKMBINDEX, TROPONINI in the last 168 hours. No results for input(s): PROBNP in the last 8760 hours. Coagulation Profile: Recent Labs  Lab 12/23/20 1025  INR 1.2   Thyroid Function Tests: No results for input(s): TSH, T4TOTAL, FREET4, T3FREE, THYROIDAB in the last 72 hours. Lipid Profile: No results for input(s): CHOL, HDL, LDLCALC, TRIG, CHOLHDL, LDLDIRECT in the last 72 hours. Anemia Panel: Recent Labs    12/28/20 0529  FERRITIN 272   Urine analysis:    Component Value Date/Time   COLORURINE STRAW (A) 12/23/2020 1026   APPEARANCEUR CLEAR (A) 12/23/2020 1026   LABSPEC 1.003 (L) 12/23/2020 1026   PHURINE 7.0 12/23/2020 1026   GLUCOSEU NEGATIVE 12/23/2020 1026   HGBUR NEGATIVE 12/23/2020 1026   BILIRUBINUR NEGATIVE 12/23/2020 1026   KETONESUR NEGATIVE 12/23/2020 1026   PROTEINUR NEGATIVE 12/23/2020 1026   NITRITE NEGATIVE 12/23/2020 1026   LEUKOCYTESUR MODERATE (A) 12/23/2020 1026   Sepsis Labs: Invalid input(s): PROCALCITONIN, LACTICIDVEN  Microbiology: Recent Results (from the past 240 hour(s))  Culture, blood (Routine x 2)     Status: None   Collection Time: 12/23/20 10:25 AM   Specimen: BLOOD LEFT HAND  Result Value Ref Range Status   Specimen  Description BLOOD LEFT HAND  Final   Special Requests   Final    BOTTLES DRAWN AEROBIC AND ANAEROBIC Blood Culture adequate volume   Culture   Final    NO GROWTH 5 DAYS Performed at [facility], [address] [place], [place] [address]    Report Status 12/28/2020 FINAL  Final  Culture, blood (Routine x 2)     Status: None   Collection Time: 12/23/20 10:30 AM   Specimen: Right Antecubital; Blood  Result Value Ref Range Status   Specimen Description RIGHT ANTECUBITAL  Final   Special Requests   Final    BOTTLES DRAWN AEROBIC AND ANAEROBIC Blood  Culture adequate volume   Culture   Final    NO GROWTH 5 DAYS Performed at [facility], [address] [place], [place] [address]    Report Status 12/28/2020 FINAL  Final  Urine culture     Status: Abnormal   Collection Time: 12/23/20 10:36 AM   Specimen: Urine, Random  Result Value Ref Range Status   Specimen Description   Final    URINE, RANDOM Performed at [facility], [address] [place], [place] [address]    Special Requests   Final    NONE Performed at [facility], [address], [place], [place] [address]    Culture 10,000 COLONIES/mL PROTEUS MIRABILIS (A)  Final   Report Status 12/25/2020 FINAL  Final   Organism ID, Bacteria PROTEUS MIRABILIS (A)  Final      Susceptibility   Proteus mirabilis - MIC*    AMPICILLIN <=2 SENSITIVE Sensitive     CEFAZOLIN <=4 SENSITIVE Sensitive     CEFEPIME <=0.12 SENSITIVE Sensitive     CEFTRIAXONE <=0.25 SENSITIVE Sensitive     CIPROFLOXACIN <=0.25 SENSITIVE Sensitive     GENTAMICIN <=1 SENSITIVE Sensitive     IMIPENEM 1 SENSITIVE Sensitive     NITROFURANTOIN 128 RESISTANT Resistant     TRIMETH/SULFA <=20 SENSITIVE Sensitive     AMPICILLIN/SULBACTAM <=2 SENSITIVE Sensitive     PIP/TAZO <=4 SENSITIVE Sensitive     * 10,000 COLONIES/mL PROTEUS MIRABILIS  SARS CORONAVIRUS 2 (TAT 6-24 HRS) Nasopharyngeal Nasopharyngeal Swab     Status: None   Collection Time: 12/23/20  1:20 PM   Specimen: Nasopharyngeal Swab  Result Value Ref Range Status   SARS Coronavirus 2 NEGATIVE NEGATIVE Final    Comment: (NOTE) SARS-CoV-2 target nucleic acids are NOT DETECTED.  The SARS-CoV-2 RNA is generally detectable in upper and lower respiratory specimens during the acute phase of infection. Negative results do not preclude SARS-CoV-2 infection, do not rule out co-infections with other pathogens, and should not be used as the sole basis for treatment or other patient management decisions. Negative results must be  combined with clinical observations, patient history, and epidemiological information. The expected result is Negative.  Fact Sheet for Patients: HairSlick.no  Fact Sheet for Healthcare Providers: quierodirigir.com  This test is not yet approved or cleared by the [place] FDA and  has been authorized for detection and/or diagnosis of SARS-CoV-2 by FDA under an Emergency Use Authorization (EUA). This EUA will remain  in effect (meaning this test can be used) for the duration of the COVID-19 declaration under Se ction 564(b)(1) of the Act, 21 U.S.C. section 360bbb-3(b)(1), unless the authorization is terminated or revoked sooner.  Performed at [facility], [address] [place], [place] [address]   Respiratory (~20 pathogens) panel by PCR     Status:  None   Collection Time: 12/26/20 10:17 AM   Specimen: Nasopharyngeal Swab; Respiratory  Result Value Ref Range Status   Adenovirus NOT DETECTED NOT DETECTED Final   Coronavirus 229E NOT DETECTED NOT DETECTED Final    Comment: (NOTE) The Coronavirus on the Respiratory Panel, DOES NOT test for the novel  Coronavirus (2019 nCoV)    Coronavirus HKU1 NOT DETECTED NOT DETECTED Final   Coronavirus NL63 NOT DETECTED NOT DETECTED Final   Coronavirus OC43 NOT DETECTED NOT DETECTED Final   Metapneumovirus NOT DETECTED NOT DETECTED Final   Rhinovirus / Enterovirus NOT DETECTED NOT DETECTED Final   Influenza A NOT DETECTED NOT DETECTED Final   Influenza B NOT DETECTED NOT DETECTED Final   Parainfluenza Virus 1 NOT DETECTED NOT DETECTED Final   Parainfluenza Virus 2 NOT DETECTED NOT DETECTED Final   Parainfluenza Virus 3 NOT DETECTED NOT DETECTED Final   Parainfluenza Virus 4 NOT DETECTED NOT DETECTED Final   Respiratory Syncytial Virus NOT DETECTED NOT DETECTED Final   Bordetella pertussis NOT DETECTED NOT DETECTED Final   Bordetella Parapertussis NOT DETECTED NOT DETECTED  Final   Chlamydophila pneumoniae NOT DETECTED NOT DETECTED Final   Mycoplasma pneumoniae NOT DETECTED NOT DETECTED Final    Comment: Performed at [facility], [address] [place], [place] [address]  CULTURE, BLOOD (ROUTINE X 2) w Reflex to ID Panel     Status: None (Preliminary result)   Collection Time: 12/28/20  6:14 PM   Specimen: BLOOD  Result Value Ref Range Status   Specimen Description BLOOD BLOOD LEFT HAND  Final   Special Requests   Final    BOTTLES DRAWN AEROBIC AND ANAEROBIC Blood Culture adequate volume   Culture   Final    NO GROWTH < 24 HOURS Performed at [facility], [address] [place], [place] [address]    Report Status PENDING  Incomplete  CULTURE, BLOOD (ROUTINE X 2) w Reflex to ID Panel     Status: None (Preliminary result)   Collection Time: 12/28/20  6:14 PM   Specimen: BLOOD  Result Value Ref Range Status   Specimen Description BLOOD BLOOD RIGHT HAND  Final   Special Requests   Final    BOTTLES DRAWN AEROBIC AND ANAEROBIC Blood Culture results may not be optimal due to an inadequate volume of blood received in culture bottles   Culture   Final    NO GROWTH < 24 HOURS Performed at [facility], [address] [place], [place] [address]    Report Status PENDING  Incomplete  SARS CORONAVIRUS 2 (TAT 6-24 HRS) Nasopharyngeal Nasopharyngeal Swab     Status: None   Collection Time: 12/29/20  8:20 AM   Specimen: Nasopharyngeal Swab  Result Value Ref Range Status   SARS Coronavirus 2 NEGATIVE NEGATIVE Final    Comment: (NOTE) SARS-CoV-2 target nucleic acids are NOT DETECTED.  The SARS-CoV-2 RNA is generally detectable in upper and lower respiratory specimens during the acute phase of infection. Negative results do not preclude SARS-CoV-2 infection, do not rule out co-infections with other pathogens, and should not be used as the sole basis for treatment or other patient management decisions. Negative results must be combined  with clinical observations, patient history, and epidemiological information. The expected result is Negative.  Fact Sheet for Patients: HairSlick.no  Fact Sheet for Healthcare Providers: quierodirigir.com  This test is not yet approved or cleared by the [place] FDA and  has been authorized for detection and/or diagnosis  of SARS-CoV-2 by FDA under an Emergency Use Authorization (EUA). This EUA will remain  in effect (meaning this test can be used) for the duration of the COVID-19 declaration under Se ction 564(b)(1) of the Act, 21 U.S.C. section 360bbb-3(b)(1), unless the authorization is terminated or revoked sooner.  Performed at [facility], [address] [place], [place] [id number]     Radiology Studies: CT Angio Chest Pulmonary Embolism (PE) W or WO Contrast  Result Date: 12/29/2020 CLINICAL DATA:  Sepsis and fever of unknown origin. EXAM: CT ANGIOGRAPHY CHEST CT ABDOMEN AND PELVIS WITH CONTRAST TECHNIQUE: Multidetector CT imaging of the chest was performed using the standard protocol during bolus administration of intravenous contrast. Multiplanar CT image reconstructions and MIPs were obtained to evaluate the vascular anatomy. Multidetector CT imaging of the abdomen and pelvis was performed using the standard protocol during bolus administration of intravenous contrast. CONTRAST:  75mL OMNIPAQUE IOHEXOL 350 MG/ML SOLN COMPARISON:  Abdomen/pelvis CT 12/23/2020 FINDINGS: CTA CHEST FINDINGS Cardiovascular: Heart is enlarged. Atherosclerotic calcification is noted in the wall of the thoracic aorta. There is no filling defect within the opacified pulmonary arteries to suggest the presence of an acute pulmonary embolus. Mediastinum/Nodes: No mediastinal lymphadenopathy. There is no hilar lymphadenopathy. The esophagus has normal imaging features. There is no axillary lymphadenopathy. Lungs/Pleura: Subsegmental atelectasis  noted dependent lung bases. Superimposed pneumonia cannot be excluded in the left base (image 65/6). There is no evidence of pleural effusion. Musculoskeletal: No worrisome lytic or sclerotic osseous abnormality. Review of the MIP images confirms the above findings. CT ABDOMEN and PELVIS FINDINGS Hepatobiliary: No suspicious focal abnormality within the liver parenchyma. There is no evidence for gallstones, gallbladder wall thickening, or pericholecystic fluid. No intrahepatic or extrahepatic biliary dilation. Pancreas: No focal mass lesion. No dilatation of the main duct. No intraparenchymal cyst. No peripancreatic edema. Spleen: No splenomegaly. No focal mass lesion. Adrenals/Urinary Tract: No adrenal nodule or mass. Areas of segmental edema/hypoperfusion noted upper pole right kidney (best seen on delayed images 9 and 10 of series 7. There is also wedge-shaped segmental edema anterior upper left kidney on 12/07 and upper interpolar left kidney on 16/7. No evidence for hydroureter. Bladder decompressed by Foley catheter with gas in the bladder presumably secondary to instrumentation. Stomach/Bowel: Tiny hiatal hernia. Stomach decompressed. Duodenum is normally positioned as is the ligament of Treitz. No small bowel wall thickening. No small bowel dilatation. The terminal ileum is normal. The appendix is normal. No gross colonic mass. No colonic wall thickening. Vascular/Lymphatic: There is abdominal aortic atherosclerosis without aneurysm. There is no gastrohepatic or hepatoduodenal ligament lymphadenopathy. No retroperitoneal or mesenteric lymphadenopathy. No pelvic sidewall lymphadenopathy. Reproductive: The uterus is unremarkable.  There is no adnexal mass. Other: No intraperitoneal free fluid. Musculoskeletal: Scattered foci of gas within the anterior subcutaneous fat of the abdominal wall likely related to injection sites. No worrisome lytic or sclerotic osseous abnormality. Stable changes of sacroiliitis.  IMPRESSION: 1. No CT evidence for acute pulmonary embolus. 2. Subsegmental atelectasis in the dependent lung bases. Superimposed pneumonia cannot be excluded in the left base. 3. Bilateral wedge-shaped areas of segmental edema/hypoperfusion in the kidneys, right greater than left. Imaging features compatible with pyelonephritis. 4. Tiny hiatal hernia. 5. Aortic Atherosclerosis (ICD10-I70.0). Electronically Signed   By: Kennith CenterEric  Mansell M.D.   On: 12/29/2020 16:03   CT ABDOMEN PELVIS W CONTRAST  Result Date: 12/29/2020 CLINICAL DATA:  Sepsis and fever of unknown origin. EXAM: CT ANGIOGRAPHY CHEST CT ABDOMEN AND PELVIS WITH CONTRAST TECHNIQUE:  Multidetector CT imaging of the chest was performed using the standard protocol during bolus administration of intravenous contrast. Multiplanar CT image reconstructions and MIPs were obtained to evaluate the vascular anatomy. Multidetector CT imaging of the abdomen and pelvis was performed using the standard protocol during bolus administration of intravenous contrast. CONTRAST:  85mL OMNIPAQUE IOHEXOL 350 MG/ML SOLN COMPARISON:  Abdomen/pelvis CT 12/23/2020 FINDINGS: CTA CHEST FINDINGS Cardiovascular: Heart is enlarged. Atherosclerotic calcification is noted in the wall of the thoracic aorta. There is no filling defect within the opacified pulmonary arteries to suggest the presence of an acute pulmonary embolus. Mediastinum/Nodes: No mediastinal lymphadenopathy. There is no hilar lymphadenopathy. The esophagus has normal imaging features. There is no axillary lymphadenopathy. Lungs/Pleura: Subsegmental atelectasis noted dependent lung bases. Superimposed pneumonia cannot be excluded in the left base (image 65/6). There is no evidence of pleural effusion. Musculoskeletal: No worrisome lytic or sclerotic osseous abnormality. Review of the MIP images confirms the above findings. CT ABDOMEN and PELVIS FINDINGS Hepatobiliary: No suspicious focal abnormality within the liver  parenchyma. There is no evidence for gallstones, gallbladder wall thickening, or pericholecystic fluid. No intrahepatic or extrahepatic biliary dilation. Pancreas: No focal mass lesion. No dilatation of the main duct. No intraparenchymal cyst. No peripancreatic edema. Spleen: No splenomegaly. No focal mass lesion. Adrenals/Urinary Tract: No adrenal nodule or mass. Areas of segmental edema/hypoperfusion noted upper pole right kidney (best seen on delayed images 9 and 10 of series 7. There is also wedge-shaped segmental edema anterior upper left kidney on 12/07 and upper interpolar left kidney on 16/7. No evidence for hydroureter. Bladder decompressed by Foley catheter with gas in the bladder presumably secondary to instrumentation. Stomach/Bowel: Tiny hiatal hernia. Stomach decompressed. Duodenum is normally positioned as is the ligament of Treitz. No small bowel wall thickening. No small bowel dilatation. The terminal ileum is normal. The appendix is normal. No gross colonic mass. No colonic wall thickening. Vascular/Lymphatic: There is abdominal aortic atherosclerosis without aneurysm. There is no gastrohepatic or hepatoduodenal ligament lymphadenopathy. No retroperitoneal or mesenteric lymphadenopathy. No pelvic sidewall lymphadenopathy. Reproductive: The uterus is unremarkable.  There is no adnexal mass. Other: No intraperitoneal free fluid. Musculoskeletal: Scattered foci of gas within the anterior subcutaneous fat of the abdominal wall likely related to injection sites. No worrisome lytic or sclerotic osseous abnormality. Stable changes of sacroiliitis. IMPRESSION: 1. No CT evidence for acute pulmonary embolus. 2. Subsegmental atelectasis in the dependent lung bases. Superimposed pneumonia cannot be excluded in the left base. 3. Bilateral wedge-shaped areas of segmental edema/hypoperfusion in the kidneys, right greater than left. Imaging features compatible with pyelonephritis. 4. Tiny hiatal hernia. 5. Aortic  Atherosclerosis (ICD10-I70.0). Electronically Signed   By: Kennith Center M.D.   On: 12/29/2020 16:03       Marlin Canary DO [facility]  If 7PM-7AM, please contact night-coverage [email or web address] 12/30/2020, 9:03 AM

## 2020-12-30 NOTE — TOC Progression Note (Signed)
Transition of Care [facility]) - Progression Note    Patient Details  Name: Wendy Nelson MRN: [id number] Date of Birth: [birth year]  Transition of [facility]) CM/SW Contact  Allayne Butcher, RN Phone Number: 12/30/2020, 3:23 PM  Clinical Narrative:    Patient will not qualify for IP rehab.  Patient will need to return to [facility] at discharge.  PT needs to work with patient over the weekend, last documented PT session was 6/14.     Expected Discharge Plan: Skilled Nursing Facility Barriers to Discharge: Continued Medical Work up  Expected Discharge Plan and Services Expected Discharge Plan: Skilled Nursing Facility   Discharge Planning Services: CM Consult Post Acute Care Choice: Skilled Nursing Facility Living arrangements for the past 2 months: Skilled Nursing Facility                 DME Arranged: N/A DME Agency: NA       HH Arranged: NA           Social Determinants of Health (SDOH) Interventions    Readmission Risk Interventions No flowsheet data found.

## 2020-12-30 NOTE — Progress Notes (Signed)
Physical Therapy Treatment Patient Details Name: Wendy Nelson MRN: [id number] DOB: [birth year] Today's Date: 12/30/2020    History of Present Illness Pt is a 55 y.o. female presenting to hospital 6/10 from nursing facility with concerns for possible sepsis with some fevers and nausea today.  Pt admitted with sepsis (likely d/t urinary souce).  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    PT Comments    Pt in supine upon arrival to room and agreeable to therapy.  Primary focus was sitting balance and to engage core musculature with use of PT/OT co-treat and to keep pt safe during treatment.  Pt was able to assist with core stability at times, however fatigues quickly.  Pt was seated at EOB with elbows propped on knees for >1 minute.  Pt did have bowel movement during session in which therapists provided maxA to assist with self-care.  Current discharge plans to SNF remain appropriate at this time.  Pt will continue to benefit from skilled therapy in order to address deficits listed below.    Follow Up Recommendations  SNF     Equipment Recommendations  Other (comment) (pt has needed DME at facility already)    Recommendations for Other Services       Precautions / Restrictions Precautions Precautions: Fall Precaution Comments: Residual CVA deficits; chronic indwelling catheter Required Braces or Orthoses: Other Brace Other Brace: Cervical soft collar for comfort only (per facility soft cervical collar was discontinued)    Mobility  Bed Mobility Overal bed mobility: Needs Assistance Bed Mobility: Rolling;Supine to Sit;Sit to Supine Rolling: Total assist;+2 for physical assistance   Supine to sit: Total assist;+2  for physical assistance Sit to supine: Total assist;+2 for physical assistance   General bed mobility comments: Pt able to reach L and R this session with bed mobility with mod multimodal cuing. +2 assistance for rolling secondary to weakness and body habitus.    Transfers                 General transfer comment: Deferred (pt has been sitting edge of bed with 2 assist)  Ambulation/Gait                 Stairs             Wheelchair Mobility    Modified Rankin (Stroke Patients Only)       Balance Overall balance assessment: Needs assistance Sitting-balance support: Feet supported Sitting balance-Leahy Scale: Poor Sitting balance - Comments: SBA (1 minute) - max A with cuing to reposition Postural control: Left lateral lean;Posterior lean                                  Cognition Arousal/Alertness: Awake/alert Behavior During Therapy: Flat affect Overall Cognitive Status: History of cognitive impairments - at baseline                                 General Comments: Pt does appear more alert this session and following commands with increased time.      Exercises      General Comments        Pertinent Vitals/Pain Pain Assessment: No/denies pain Faces Pain  Scale: No hurt    Home Living Family/patient expects to be discharged to:: Skilled nursing facility               Additional Comments: [facility]    Prior Function Level of Independence: Needs assistance          PT Goals (current goals can now be found in the care plan section) Acute Rehab PT Goals Patient Stated Goal: to improve strength and mobility PT Goal Formulation: With patient Time For Goal Achievement: 01/10/21 Potential to Achieve Goals: Fair Progress towards PT goals: Not progressing toward goals - comment    Frequency    Min 2X/week      PT Plan      Co-evaluation   Reason for Co-Treatment: Complexity of the patient's  impairments (multi-system involvement);For patient/therapist safety;To address functional/ADL transfers PT goals addressed during session: Mobility/safety with mobility;Balance OT goals addressed during session: ADL's and self-care;Other (comment) (sitting balance)      AM-PAC PT "6 Clicks" Mobility   Outcome Measure  Help needed turning from your back to your side while in a flat bed without using bedrails?: Total Help needed moving from lying on your back to sitting on the side of a flat bed without using bedrails?: Total Help needed moving to and from a bed to a chair (including a wheelchair)?: Total Help needed standing up from a chair using your arms (e.g., wheelchair or bedside chair)?: Total Help needed to walk in hospital room?: Total Help needed climbing 3-5 steps with a railing? : Total 6 Click Score: 6    End of Session   Activity Tolerance: Patient tolerated treatment well Patient left: in bed;with call bell/phone within reach;with bed alarm set;with nursing/sitter in room;with family/visitor present;Other (comment) (B prevalon boots donned; pt positioned/rolled towards L side (was positioned/rolled towards R side beginning of session)) Nurse Communication: Precautions;Mobility status PT Visit Diagnosis: Other abnormalities of gait and mobility (R26.89);Muscle weakness (generalized) (M62.81)     Time: 3354-5625 PT Time Calculation (min) (ACUTE ONLY): 32 min  Charges:  $Therapeutic Activity: 8-22 mins                     Nolon Bussing, PT, DPT 12/30/20, 4:53 PM    Phineas Real 12/30/2020, 4:50 PM

## 2020-12-30 NOTE — Progress Notes (Signed)
Date of Admission:  12/23/2020     ID: Wendy Nelson is a 55 y.o. female Active Problems:   Cervical spondylosis with myelopathy and radiculopathy   Spinal cord infarction [facility])   Depression with anxiety   Neurogenic bladder   Sepsis (HCC)    Subjective: Doing better No fever  Medications:   acamprosate  333 mg Oral TID   vitamin C  500 mg Oral BID   aspirin EC  325 mg Oral Daily   atorvastatin  10 mg Oral Daily   Chlorhexidine Gluconate Cloth  6 each Topical Daily   dicyclomine  10 mg Oral TID WC   DULoxetine  60 mg Oral Daily   enoxaparin (LOVENOX) injection  60 mg Subcutaneous Q24H   multivitamin with minerals  1 tablet Oral Daily   nystatin   Topical BID   pantoprazole  80 mg Oral Daily   polyethylene glycol  17 g Oral BID   pregabalin  100 mg Oral BID   tiZANidine  2 mg Oral BID    Objective: Patient Vitals for the past 24 hrs:  BP Temp Temp src Pulse Resp SpO2  12/30/20 1236 116/82 98.3 F (36.8 C) -- 77 18 98 %  12/30/20 0812 133/80 99 F (37.2 C) -- 76 18 100 %  12/30/20 0458 110/77 98.9 F (37.2 C) Oral 82 16 100 %  12/30/20 0100 97/65 98 F (36.7 C) -- 84 18 100 %  12/29/20 1951 98/66 97.8 F (36.6 C) Oral 84 16 100 %  12/29/20 1708 105/65 98.1 F (36.7 C) Oral 85 20 100 %    Vital signs in last 24 hours: Temp:  [97.8 F (36.6 C)-99 F (37.2 C)] 98.3 F (36.8 C) (06/17 1236) Pulse Rate:  [76-85] 77 (06/17 1236) Resp:  [16-20] 18 (06/17 1236) BP: (97-133)/(65-82) 116/82 (06/17 1236) SpO2:  [98 %-100 %] 98 % (06/17 1236)  PHYSICAL EXAM:  General: Alert, cooperative, no distress, appears stated age.  Head: Normocephalic, without obvious abnormality, atraumatic. Eyes: Conjunctivae clear, anicteric sclerae. Pupils are equal ENT Nares normal. No drainage or sinus tenderness. Lips, mucosa, and tongue normal. No Thrush Neck: Supple, symmetrical, no adenopathy, thyroid: non tender no carotid bruit and no JVD. Back: No CVA tenderness. Lungs: Clear  to auscultation bilaterally. No Wheezing or Rhonchi. No rales. Heart: Regular rate and rhythm, no murmur, rub or gallop. Abdomen: Soft, non-tender,not distended. Bowel sounds normal. No masses Extremities: atraumatic, no cyanosis. No edema. No clubbing Skin: No rashes or lesions. Or bruising Lymph: Cervical, supraclavicular normal. Neurologic: paraplegia- upper extremities- 2/5 rt 3/5 left  Lab Results Recent Labs    12/28/20 0529 12/29/20 0541 12/30/20 0425  WBC 13.2* 12.1* 10.1  HGB 10.9* 10.8* 10.8*  HCT 32.8* 32.3* 34.1*  NA 132*  --  137  K 4.4  --  4.5  CL 94*  --  106  CO2 28  --  24  BUN 14  --  14  CREATININE 0.74  --  0.60   Liver Panel Recent Labs    12/28/20 0529  PROT 7.0  ALBUMIN 2.9*  2.9*  AST 27  ALT 20  ALKPHOS 100  BILITOT 0.5  BILIDIR <0.1  IBILI NOT CALCULATED   Sedimentation Rate Recent Labs    12/29/20 0541  ESRSEDRATE 106*   C-Reactive Protein Recent Labs    12/29/20 0541  CRP 1.6*    Microbiology:  Studies/Results: CT Angio Chest Pulmonary Embolism (PE) W or WO Contrast  Result Date:  12/29/2020 CLINICAL DATA:  Sepsis and fever of unknown origin. EXAM: CT ANGIOGRAPHY CHEST CT ABDOMEN AND PELVIS WITH CONTRAST TECHNIQUE: Multidetector CT imaging of the chest was performed using the standard protocol during bolus administration of intravenous contrast. Multiplanar CT image reconstructions and MIPs were obtained to evaluate the vascular anatomy. Multidetector CT imaging of the abdomen and pelvis was performed using the standard protocol during bolus administration of intravenous contrast. CONTRAST:  75mL OMNIPAQUE IOHEXOL 350 MG/ML SOLN COMPARISON:  Abdomen/pelvis CT 12/23/2020 FINDINGS: CTA CHEST FINDINGS Cardiovascular: Heart is enlarged. Atherosclerotic calcification is noted in the wall of the thoracic aorta. There is no filling defect within the opacified pulmonary arteries to suggest the presence of an acute pulmonary embolus.  Mediastinum/Nodes: No mediastinal lymphadenopathy. There is no hilar lymphadenopathy. The esophagus has normal imaging features. There is no axillary lymphadenopathy. Lungs/Pleura: Subsegmental atelectasis noted dependent lung bases. Superimposed pneumonia cannot be excluded in the left base (image 65/6). There is no evidence of pleural effusion. Musculoskeletal: No worrisome lytic or sclerotic osseous abnormality. Review of the MIP images confirms the above findings. CT ABDOMEN and PELVIS FINDINGS Hepatobiliary: No suspicious focal abnormality within the liver parenchyma. There is no evidence for gallstones, gallbladder wall thickening, or pericholecystic fluid. No intrahepatic or extrahepatic biliary dilation. Pancreas: No focal mass lesion. No dilatation of the main duct. No intraparenchymal cyst. No peripancreatic edema. Spleen: No splenomegaly. No focal mass lesion. Adrenals/Urinary Tract: No adrenal nodule or mass. Areas of segmental edema/hypoperfusion noted upper pole right kidney (best seen on delayed images 9 and 10 of series 7. There is also wedge-shaped segmental edema anterior upper left kidney on 12/07 and upper interpolar left kidney on 16/7. No evidence for hydroureter. Bladder decompressed by Foley catheter with gas in the bladder presumably secondary to instrumentation. Stomach/Bowel: Tiny hiatal hernia. Stomach decompressed. Duodenum is normally positioned as is the ligament of Treitz. No small bowel wall thickening. No small bowel dilatation. The terminal ileum is normal. The appendix is normal. No gross colonic mass. No colonic wall thickening. Vascular/Lymphatic: There is abdominal aortic atherosclerosis without aneurysm. There is no gastrohepatic or hepatoduodenal ligament lymphadenopathy. No retroperitoneal or mesenteric lymphadenopathy. No pelvic sidewall lymphadenopathy. Reproductive: The uterus is unremarkable.  There is no adnexal mass. Other: No intraperitoneal free fluid.  Musculoskeletal: Scattered foci of gas within the anterior subcutaneous fat of the abdominal wall likely related to injection sites. No worrisome lytic or sclerotic osseous abnormality. Stable changes of sacroiliitis. IMPRESSION: 1. No CT evidence for acute pulmonary embolus. 2. Subsegmental atelectasis in the dependent lung bases. Superimposed pneumonia cannot be excluded in the left base. 3. Bilateral wedge-shaped areas of segmental edema/hypoperfusion in the kidneys, right greater than left. Imaging features compatible with pyelonephritis. 4. Tiny hiatal hernia. 5. Aortic Atherosclerosis (ICD10-I70.0). Electronically Signed   By: Kennith CenterEric  Mansell M.D.   On: 12/29/2020 16:03   MR CERVICAL SPINE W WO CONTRAST  Result Date: 12/30/2020 CLINICAL DATA:  Fever unknown origin. Sepsis. History of spinal cord infarct and paraplegia. EXAM: MRI CERVICAL SPINE WITHOUT AND WITH CONTRAST TECHNIQUE: Multiplanar and multiecho pulse sequences of the cervical spine, to include the craniocervical junction and cervicothoracic junction, were obtained without and with intravenous contrast. CONTRAST:  10mL GADAVIST GADOBUTROL 1 MMOL/ML IV SOLN COMPARISON:  None. FINDINGS: Alignment: Normal Vertebrae: ACDF C4 through C6. Posterior screw and rod fusion C5 through C7. Negative for fracture. No evidence of spinal infection Cord: Extensive atrophy of the cord with associated hyperintense signal at C6 and C7 compatible with chronic myelomalacia.  No cord compression. Posterior Fossa, vertebral arteries, paraspinal tissues: Negative for soft tissue mass or abscess. Empty sella, incidentally noted. Disc levels: C2-3: Negative C3-4: Shallow central disc protrusion.  Negative for stenosis. C4-5: ACDF.  Negative for stenosis C5-6: ACDF with posterior fusion and laminectomy. Negative for stenosis C6-7: Posterior hardware fusion.  Negative for stenosis C7-T1: Negative IMPRESSION: No evidence of infection in the cervical spine. No abscess or discitis  identified ACDF C4 through C6. Posterior hardware fusion C5 through C7. Extensive myelomalacia and atrophy of the cord at C6 and C7. Electronically Signed   By: Marlan Palau M.D.   On: 12/30/2020 12:54   MR THORACIC SPINE W WO CONTRAST  Result Date: 12/30/2020 CLINICAL DATA:  Fever unknown origin.  Paraplegia. EXAM: MRI THORACIC WITHOUT AND WITH CONTRAST TECHNIQUE: Multiplanar and multiecho pulse sequences of the thoracic spine were obtained without and with intravenous contrast. CONTRAST:  68mL GADAVIST GADOBUTROL 1 MMOL/ML IV SOLN COMPARISON:  None. FINDINGS: Alignment:  Normal Vertebrae: Negative for fracture or mass. No evidence of spinal infection thoracic spine Cord: Limited cord evaluation due to motion. Allowing for this, no cord signal abnormality identified. Paraspinal and other soft tissues: Negative for paraspinous mass or abscess. No epidural abscess. Mild atelectasis in the lung bases. Disc levels: Mild disc degeneration in the thoracic spine. No disc protrusion or significant spinal stenosis. IMPRESSION: No source of infection in the thoracic spine.  No acute abnormality Electronically Signed   By: Marlan Palau M.D.   On: 12/30/2020 12:56   MR Lumbar Spine W Wo Contrast  Result Date: 12/30/2020 CLINICAL DATA:  Fever unknown origin.  Paraplegia EXAM: MRI LUMBAR SPINE WITHOUT AND WITH CONTRAST TECHNIQUE: Multiplanar and multiecho pulse sequences of the lumbar spine were obtained without and with intravenous contrast. CONTRAST:  31mL GADAVIST GADOBUTROL 1 MMOL/ML IV SOLN COMPARISON:  None. FINDINGS: Segmentation:  Normal Alignment:  Normal Vertebrae: Negative for fracture or mass. No evidence of spinal infection. Conus medullaris and cauda equina: Conus extends to the L1-2 level. Conus and cauda equina appear normal. Paraspinal and other soft tissues: Negative for paraspinous mass, adenopathy, or abscess. Disc levels: L1-2: Negative L2-3: Negative L3-4: Mild facet degeneration.  Negative for  stenosis L4-5: Diffuse disc bulging and bilateral facet degeneration. Negative for stenosis L5-S1: Small central disc protrusion.  Negative for stenosis. IMPRESSION: No acute abnormality.  No source of infection in the lumbar spine. Electronically Signed   By: Marlan Palau M.D.   On: 12/30/2020 12:59   CT ABDOMEN PELVIS W CONTRAST  Result Date: 12/29/2020 CLINICAL DATA:  Sepsis and fever of unknown origin. EXAM: CT ANGIOGRAPHY CHEST CT ABDOMEN AND PELVIS WITH CONTRAST TECHNIQUE: Multidetector CT imaging of the chest was performed using the standard protocol during bolus administration of intravenous contrast. Multiplanar CT image reconstructions and MIPs were obtained to evaluate the vascular anatomy. Multidetector CT imaging of the abdomen and pelvis was performed using the standard protocol during bolus administration of intravenous contrast. CONTRAST:  43mL OMNIPAQUE IOHEXOL 350 MG/ML SOLN COMPARISON:  Abdomen/pelvis CT 12/23/2020 FINDINGS: CTA CHEST FINDINGS Cardiovascular: Heart is enlarged. Atherosclerotic calcification is noted in the wall of the thoracic aorta. There is no filling defect within the opacified pulmonary arteries to suggest the presence of an acute pulmonary embolus. Mediastinum/Nodes: No mediastinal lymphadenopathy. There is no hilar lymphadenopathy. The esophagus has normal imaging features. There is no axillary lymphadenopathy. Lungs/Pleura: Subsegmental atelectasis noted dependent lung bases. Superimposed pneumonia cannot be excluded in the left base (image 65/6). There is no  evidence of pleural effusion. Musculoskeletal: No worrisome lytic or sclerotic osseous abnormality. Review of the MIP images confirms the above findings. CT ABDOMEN and PELVIS FINDINGS Hepatobiliary: No suspicious focal abnormality within the liver parenchyma. There is no evidence for gallstones, gallbladder wall thickening, or pericholecystic fluid. No intrahepatic or extrahepatic biliary dilation. Pancreas: No  focal mass lesion. No dilatation of the main duct. No intraparenchymal cyst. No peripancreatic edema. Spleen: No splenomegaly. No focal mass lesion. Adrenals/Urinary Tract: No adrenal nodule or mass. Areas of segmental edema/hypoperfusion noted upper pole right kidney (best seen on delayed images 9 and 10 of series 7. There is also wedge-shaped segmental edema anterior upper left kidney on 12/07 and upper interpolar left kidney on 16/7. No evidence for hydroureter. Bladder decompressed by Foley catheter with gas in the bladder presumably secondary to instrumentation. Stomach/Bowel: Tiny hiatal hernia. Stomach decompressed. Duodenum is normally positioned as is the ligament of Treitz. No small bowel wall thickening. No small bowel dilatation. The terminal ileum is normal. The appendix is normal. No gross colonic mass. No colonic wall thickening. Vascular/Lymphatic: There is abdominal aortic atherosclerosis without aneurysm. There is no gastrohepatic or hepatoduodenal ligament lymphadenopathy. No retroperitoneal or mesenteric lymphadenopathy. No pelvic sidewall lymphadenopathy. Reproductive: The uterus is unremarkable.  There is no adnexal mass. Other: No intraperitoneal free fluid. Musculoskeletal: Scattered foci of gas within the anterior subcutaneous fat of the abdominal wall likely related to injection sites. No worrisome lytic or sclerotic osseous abnormality. Stable changes of sacroiliitis. IMPRESSION: 1. No CT evidence for acute pulmonary embolus. 2. Subsegmental atelectasis in the dependent lung bases. Superimposed pneumonia cannot be excluded in the left base. 3. Bilateral wedge-shaped areas of segmental edema/hypoperfusion in the kidneys, right greater than left. Imaging features compatible with pyelonephritis. 4. Tiny hiatal hernia. 5. Aortic Atherosclerosis (ICD10-I70.0). Electronically Signed   By: Kennith Center M.D.   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

## 2020-12-30 NOTE — Progress Notes (Signed)
Occupational Therapy Treatment Patient Details Name: Navaeh Kehres MRN: [id number] DOB: 12-10-1965 Today's Date: 12/30/2020    History of present illness Pt is a 55 y.o. female presenting to hospital 6/10 from nursing facility with concerns for possible sepsis with some fevers and nausea today.  Pt admitted with sepsis (likely d/t urinary souce).  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   OT comments  Pt seen for skilled co-tx with PT. Pt supine in bed and daughter present in room. Pt is agreeable to OT intervention. Pt needing total A of 2 for supine >sit. Pt engaged in static sitting balance balance for 10 minutes with varying assist from SBA - max A. Pt with lateral lean in sitting which she was able to correct with mod - max multimodal cuing about positioning and anterior weight shift. Pt able to prop B UEs on knees and maintain for 1 minute with SBA. Pt leaning laterally L <> R with max A to return to midline. Pt having BM while seated on EOB. Total A of 2 to return to bed and rolling L <> R with pt able to reach out for bed rails when rolling. Total A for hygiene. All needs within reach upon exiting the room. Daughter remains present. Pt continues to benefit from OT intervention.     Follow Up Recommendations  SNF;Supervision/Assistance - 24 hour    Equipment Recommendations  Other (comment) (defer to next venue of care)       Precautions / Restrictions Precautions Precautions: Fall Precaution Comments: Residual CVA deficits; chronic indwelling catheter Required Braces or Orthoses: Other Brace Other Brace: Cervical soft collar for comfort only (per facility soft cervical collar was discontinued)       Mobility  Bed Mobility Overal bed mobility: Needs Assistance Bed Mobility: Rolling;Supine to Sit;Sit to Supine Rolling: Total assist;+2 for physical assistance   Supine to sit: Total assist;+2 for physical assistance Sit to supine: Total assist;+2 for physical assistance   General bed mobility comments: Pt able to reach L and R this session with bed mobility with mod multimodal cuing. +2 assistance for rolling secondary to weakness and body habitus.    Transfers                 General transfer comment: Deferred (pt has been sitting edge of bed with 2 assist)    Balance Overall balance assessment: Needs assistance Sitting-balance support: Feet supported Sitting balance-Leahy Scale: Poor Sitting balance - Comments: SBA (1 minute) - max A with cuing to reposition Postural control: Left lateral lean                                   Vision Patient Visual Report: No change from baseline     Perception     Praxis      Cognition Arousal/Alertness: Awake/alert Behavior During Therapy: Flat affect Overall Cognitive Status: History of cognitive impairments - at baseline                                 General Comments: Pt does appear more alert this session and following commands with increased time.  Pertinent Vitals/ Pain       Pain Assessment: Faces Faces Pain Scale: No hurt  Home Living Family/patient expects to be discharged to:: Skilled nursing facility                                 Additional Comments: [facility]      Prior Functioning/Environment Level of Independence: Needs assistance            Frequency  Min 2X/week        Progress Toward Goals  OT Goals(current goals can now be found in the care plan section)  Progress towards OT goals: Progressing toward goals  Acute Rehab OT Goals Patient Stated Goal: to improve strength and mobility OT Goal Formulation: With patient Time For  Goal Achievement: 01/10/21 Potential to Achieve Goals: Good  Plan Discharge plan remains appropriate    Co-evaluation    PT/OT/SLP Co-Evaluation/Treatment: Yes Reason for Co-Treatment: Complexity of the patient's impairments (multi-system involvement);For patient/therapist safety;To address functional/ADL transfers PT goals addressed during session: Mobility/safety with mobility;Balance OT goals addressed during session: ADL's and self-care;Other (comment) (sitting balance)      AM-PAC OT "6 Clicks" Daily Activity     Outcome Measure   Help from another person eating meals?: A Little Help from another person taking care of personal grooming?: A Little Help from another person toileting, which includes using toliet, bedpan, or urinal?: Total Help from another person bathing (including washing, rinsing, drying)?: Total Help from another person to put on and taking off regular upper body clothing?: A Lot Help from another person to put on and taking off regular lower body clothing?: Total 6 Click Score: 11    End of Session    OT Visit Diagnosis: Muscle weakness (generalized) (M62.81)   Activity Tolerance Patient tolerated treatment well   Patient Left in bed;with call bell/phone within reach;with bed alarm set   Nurse Communication Mobility status        Time: 1341-1413 OT Time Calculation (min): 32 min  Charges: OT General Charges $OT Visit: 1 Visit OT Treatments $Therapeutic Activity: 8-22 mins  Jackquline Denmark, MS, OTR/L , CBIS ascom [phone]  12/30/20, 4:47 PM

## 2020-12-31 DIAGNOSIS — T83511A Infection and inflammatory reaction due to indwelling urethral catheter, initial encounter: Secondary | ICD-10-CM | POA: Diagnosis not present

## 2020-12-31 DIAGNOSIS — G9511 Acute infarction of spinal cord (embolic) (nonembolic): Secondary | ICD-10-CM | POA: Diagnosis not present

## 2020-12-31 DIAGNOSIS — R509 Fever, unspecified: Secondary | ICD-10-CM | POA: Diagnosis not present

## 2020-12-31 DIAGNOSIS — N390 Urinary tract infection, site not specified: Secondary | ICD-10-CM | POA: Diagnosis not present

## 2020-12-31 NOTE — Progress Notes (Addendum)
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  Reportedly febrile to 103 at nursing facility and started on Keflex for presumed UTI. Stay complicated by continued fevers until abx broadened out. Suspected pyelo and plan to d/c on levaquin PO when SNF bed available.    Subjective: No complaints   Objective: Vitals:   12/30/20 2128 12/31/20 0205 12/31/20 0655 12/31/20 0923  BP: 116/82 90/72 128/77 116/79  Pulse: 83 73 63 75  Resp: Temp: 97.8 F (36.6 C) 97.8 F (36.6 C) 98.2 F (36.8 C) 97.6 F (36.4 C)  TempSrc: Oral Oral Oral Oral  SpO2: 100% 99% 100% 100%  Weight:      Height:        Intake/Output Summary (Last 24 hours) at 12/31/2020 1135 Last data filed at 12/31/2020 0950 Gross per 24 hour  Intake --  Output 2826 ml  Net -2826 ml   Filed Weights   12/23/20 1610  Weight: 116.3 kg    Examination:   General: Appearance:    Severely obese female in no acute distress     Lungs:     respirations unlabored  Heart:    Normal heart rate.      Microbiology summarized: COVID-19 PCR negative. Blood cultures NGTD. Urine culture with 10,000 colonies Proteus mirabilis. RVP panel negative.  Assessment & Plan: Sepsis thought due to catheter associated UTI in patient with chronic indwelling Foley plus FUO -started on Keflex outpatient.  UA with moderate LE and rare bacteria.  Culture data as above.  Continues to spike fever despite appropriate antibiotic.  -Foley replaced on admission. -restarted abx 6/15 -blood cultures -6/16: pan-scan- will start  with CT scan chest/abd/pelvis: ? PNA and ? Pyelo-- MRI negative -recheck COVID neative -ID consult appreciated: DC vanco as blood culture neg, MRSA nares neg Continue cefepime, change to Levaquin on discharge until 01/04/21   BLE diplegia/spinal cord injury/chronic pain -Continue home pain medications-Lyrica, tizanidine, Mobic and as needed oxy  Anxiety/depression: Stable. -Continue home Cymbalta  Neuropathy-continue Lyrica  History of CVA: -Continue statin and aspirin.  Morbid obesity Body mass index is 42.67 kg/m.         DVT prophylaxis:    Subcu Lovenox  Code Status: Full code Level of care: Med-Surg Status is: Inpatient  Remains inpatient appropriate because:IV treatments appropriate due to intensity of illness or inability to take PO and Inpatient level of care appropriate due to severity of illness  Dispo: The patient is from:  LTF              Anticipated d/c is to: prior facility with PT?              Patient currently is medically stable to d/c.   Difficult to place patient No    Consultants:  Infectious disease  Sch Meds:  Scheduled Meds:  acamprosate  333 mg Oral TID   vitamin C  500 mg Oral BID   aspirin EC  325 mg Oral Daily   atorvastatin  10 mg Oral Daily   Chlorhexidine Gluconate Cloth  6 each Topical Daily   dicyclomine  10 mg Oral TID WC   DULoxetine  60 mg Oral Daily   enoxaparin (LOVENOX) injection  60 mg Subcutaneous Q24H   multivitamin with minerals  1 tablet Oral Daily   nystatin   Topical BID   pantoprazole  80 mg Oral Daily   polyethylene glycol  17 g Oral BID   pregabalin  100 mg Oral BID   tiZANidine  2 mg Oral BID   Continuous Infusions:  sodium chloride Stopped (12/27/20 0935)   ceFEPime (MAXIPIME) IV 2 g (12/31/20 0544)   PRN Meds:.sodium chloride, acetaminophen **OR** acetaminophen, bisacodyl, ibuprofen, LORazepam, methocarbamol, ondansetron **OR** ondansetron (ZOFRAN) IV, oxyCODONE  Antimicrobials: Anti-infectives (From  admission, onward)    Start     Dose/Rate Route Frequency Ordered Stop   12/31/20 0800  vancomycin (VANCOREADY) IVPB 2000 mg/400 mL  Status:  Discontinued        2,000 mg 200 mL/hr over 120 Minutes Intravenous Every 24 hours 12/30/20 1418 12/30/20 2129   12/29/20 1800  vancomycin (VANCOCIN) IVPB 1000 mg/200 mL premix  Status:  Discontinued        1,000 mg 200 mL/hr over 60 Minutes Intravenous Every 12 hours 12/29/20 1026 12/30/20 1418   12/29/20 0600  vancomycin (VANCOREADY) IVPB 1000 mg/200 mL  Status:  Discontinued        1,000 mg 200 mL/hr over 60 Minutes Intravenous Every 12 hours 12/28/20 1728 12/29/20 1025   12/28/20 1900  ceFEPIme (MAXIPIME) 2 g in sodium chloride 0.9 % 100 mL IVPB        2 g 200 mL/hr over 30 Minutes Intravenous Every 8 hours 12/28/20 1727     12/28/20 1815  vancomycin (VANCOREADY) IVPB 2000 mg/400 mL        2,000 mg 200 mL/hr over 120 Minutes Intravenous  Once 12/28/20 1727 12/29/20 0351   12/26/20 2200  ceFAZolin (ANCEF) 1 g in sodium chloride 0.9 % 100 mL IVPB  Status:  Discontinued        1 g 200 mL/hr over 30 Minutes Intravenous Every 8 hours 12/26/20 1550 12/28/20 0839   12/25/20 1400  ceFAZolin (ANCEF) IVPB 1 g/50 mL premix  Status:  Discontinued        1 g 100 mL/hr over 30 Minutes Intravenous Every 8 hours 12/25/20 1214 12/26/20 1550   12/24/20 0500  ceFEPIme (MAXIPIME) 2 g in sodium chloride 0.9 % 100 mL IVPB  Status:  Discontinued        2 g 200 mL/hr over 30 Minutes Intravenous Every 12 hours 12/23/20 1504 12/23/20 1619   12/23/20 1700  ceFEPIme (MAXIPIME) 2 g in sodium chloride 0.9 % 100 mL IVPB  Status:  Discontinued        2 g 200 mL/hr over 30 Minutes Intravenous Every 8 hours 12/23/20 1619 12/25/20 1214   12/23/20 1600  ceFEPIme (MAXIPIME) 2 g in sodium chloride 0.9 % 100 mL IVPB  Status:  Discontinued        2 g 200 mL/hr over 30 Minutes Intravenous  Once 12/23/20 1501 12/23/20 1619   12/23/20 1045  cefTRIAXone (ROCEPHIN) 1 g in sodium  chloride 0.9 % 100 mL IVPB        1 g 200 mL/hr over 30 Minutes Intravenous  Once 12/23/20 1035 12/23/20 1230        I have personally reviewed the following labs and images: CBC: Recent Labs  Lab 12/25/20 0719 12/26/20 1541 12/28/20 0529 12/29/20 0541 12/30/20 0425  WBC 8.5 8.5 13.2* 12.1* 10.1  NEUTROABS 3.8  --   --  7.0  --   HGB 10.6* 11.0* 10.9* 10.8* 10.8*  HCT 32.5* 33.7* 32.8* 32.3* 34.1*  MCV 85.8 85.5 84.5 85.2 89.3  PLT 273 325 452* 451* 512*   BMP &GFR Recent Labs  Lab 12/26/20 1541 12/28/20 0529 12/30/20 0425  NA 135 132* 137  K 4.7 4.4 4.5  CL 97* 94* 106  CO2 32 28 24  GLUCOSE 137* 96 92  BUN 13 14 14   CREATININE 0.79 0.74 0.60  CALCIUM 9.1 9.0 9.1  MG 1.9 1.9  --   PHOS 3.8 3.6  --    Estimated Creatinine Clearance: 103.6 mL/min (by C-G formula based on SCr of 0.6 mg/dL). Liver & Pancreas: Recent Labs  Lab 12/26/20 1541 12/28/20 0529  AST  --  27  ALT  --  20  ALKPHOS  --  100  BILITOT  --  0.5  PROT  --  7.0  ALBUMIN 2.7* 2.9*  2.9*   No results for input(s): LIPASE, AMYLASE in the last 168 hours. No results for input(s): AMMONIA in the last 168 hours. Diabetic: No results for input(s): HGBA1C in the last 72 hours. No results for input(s): GLUCAP in the last 168 hours. Cardiac Enzymes: No results for input(s): CKTOTAL, CKMB, CKMBINDEX, TROPONINI in the last 168 hours. No results for input(s): PROBNP in the last 8760 hours. Coagulation Profile: No results for input(s): INR, PROTIME in the last 168 hours.  Thyroid Function Tests: No results for input(s): TSH, T4TOTAL, FREET4, T3FREE, THYROIDAB in the last 72 hours. Lipid Profile: No results for input(s): CHOL, HDL, LDLCALC, TRIG, CHOLHDL, LDLDIRECT in the last 72 hours. Anemia Panel: No results for input(s): VITAMINB12, FOLATE, FERRITIN, TIBC, IRON, RETICCTPCT in the last 72 hours.  Urine analysis:    Component Value Date/Time   COLORURINE STRAW (A) 12/23/2020 1026    APPEARANCEUR CLEAR (A) 12/23/2020 1026   LABSPEC 1.003 (L) 12/23/2020 1026   PHURINE 7.0 12/23/2020 1026   GLUCOSEU NEGATIVE 12/23/2020 1026   HGBUR NEGATIVE 12/23/2020 1026   BILIRUBINUR NEGATIVE 12/23/2020 1026   KETONESUR NEGATIVE 12/23/2020 1026   PROTEINUR NEGATIVE 12/23/2020 1026   NITRITE NEGATIVE 12/23/2020 1026   LEUKOCYTESUR MODERATE (A) 12/23/2020 1026   Sepsis Labs: Invalid input(s): PROCALCITONIN, LACTICIDVEN  Microbiology: Recent Results (from the past 240 hour(s))  Culture, blood (Routine x 2)     Status: None   Collection Time: 12/23/20 10:25 AM   Specimen: BLOOD LEFT HAND  Result Value Ref Range Status   Specimen Description BLOOD LEFT HAND  Final   Special Requests   Final    BOTTLES DRAWN AEROBIC AND ANAEROBIC Blood Culture adequate volume   Culture   Final    NO GROWTH 5 DAYS Performed at [facility], [address] [place], [place] [address]    Report Status 12/28/2020 FINAL  Final  Culture, blood (Routine x 2)     Status: None   Collection Time: 12/23/20 10:30 AM   Specimen: Right Antecubital; Blood  Result Value Ref Range Status   Specimen Description RIGHT ANTECUBITAL  Final   Special Requests   Final    BOTTLES DRAWN AEROBIC AND ANAEROBIC Blood Culture adequate volume   Culture   Final    NO GROWTH 5 DAYS Performed at [facility], [address] [place], [place] [address]    Report Status 12/28/2020 FINAL  Final  Urine culture     Status: Abnormal   Collection Time:  12/23/20 10:36 AM   Specimen: Urine, Random  Result Value Ref Range Status   Specimen Description   Final    URINE, RANDOM Performed at [facility], [address] [place], [place] [address]    Special Requests   Final    NONE Performed at [facility], [address] [place], [place] [address]    Culture 10,000 COLONIES/mL PROTEUS MIRABILIS (A)  Final   Report Status 12/25/2020 FINAL  Final   Organism ID, Bacteria PROTEUS  MIRABILIS (A)  Final      Susceptibility   Proteus mirabilis - MIC*    AMPICILLIN <=2 SENSITIVE Sensitive     CEFAZOLIN <=4 SENSITIVE Sensitive     CEFEPIME <=0.12 SENSITIVE Sensitive     CEFTRIAXONE <=0.25 SENSITIVE Sensitive     CIPROFLOXACIN <=0.25 SENSITIVE Sensitive     GENTAMICIN <=1 SENSITIVE Sensitive     IMIPENEM 1 SENSITIVE Sensitive     NITROFURANTOIN 128 RESISTANT Resistant     TRIMETH/SULFA <=20 SENSITIVE Sensitive     AMPICILLIN/SULBACTAM <=2 SENSITIVE Sensitive     PIP/TAZO <=4 SENSITIVE Sensitive     * 10,000 COLONIES/mL PROTEUS MIRABILIS  SARS CORONAVIRUS 2 (TAT 6-24 HRS) Nasopharyngeal Nasopharyngeal Swab     Status: None   Collection Time: 12/23/20  1:20 PM   Specimen: Nasopharyngeal Swab  Result Value Ref Range Status   SARS Coronavirus 2 NEGATIVE NEGATIVE Final    Comment: (NOTE) SARS-CoV-2 target nucleic acids are NOT DETECTED.  The SARS-CoV-2 RNA is generally detectable in upper and lower respiratory specimens during the acute phase of infection. Negative results do not preclude SARS-CoV-2 infection, do not rule out co-infections with other pathogens, and should not be used as the sole basis for treatment or other patient management decisions. Negative results must be combined with clinical observations, patient history, and epidemiological information. The expected result is Negative.  Fact Sheet for Patients: HairSlick.no  Fact Sheet for Healthcare Providers: quierodirigir.com  This test is not yet approved or cleared by the [place] FDA and  has been authorized for detection and/or diagnosis of SARS-CoV-2 by FDA under an Emergency Use Authorization (EUA). This EUA will remain  in effect (meaning this test can be used) for the duration of the COVID-19 declaration under Se ction 564(b)(1) of the Act, 21 U.S.C. section 360bbb-3(b)(1), unless the authorization is terminated or revoked  sooner.  Performed at [facility], [address], [place], [place] [address]   Respiratory (~20 pathogens) panel by PCR     Status: None   Collection Time: 12/26/20 10:17 AM   Specimen: Nasopharyngeal Swab; Respiratory  Result Value Ref Range Status   Adenovirus NOT DETECTED NOT DETECTED Final   Coronavirus 229E NOT DETECTED NOT DETECTED Final    Comment: (NOTE) The Coronavirus on the Respiratory Panel, DOES NOT test for the novel  Coronavirus (2019 nCoV)    Coronavirus HKU1 NOT DETECTED NOT DETECTED Final   Coronavirus NL63 NOT DETECTED NOT DETECTED Final   Coronavirus OC43 NOT DETECTED NOT DETECTED Final   Metapneumovirus NOT DETECTED NOT DETECTED Final   Rhinovirus / Enterovirus NOT DETECTED NOT DETECTED Final   Influenza A NOT DETECTED NOT DETECTED Final   Influenza B NOT DETECTED NOT DETECTED Final   Parainfluenza Virus 1 NOT DETECTED NOT DETECTED Final   Parainfluenza Virus 2 NOT DETECTED NOT DETECTED Final   Parainfluenza Virus 3 NOT DETECTED NOT DETECTED Final   Parainfluenza Virus 4 NOT DETECTED NOT DETECTED Final  Respiratory Syncytial Virus NOT DETECTED NOT DETECTED Final   Bordetella pertussis NOT DETECTED NOT DETECTED Final   Bordetella Parapertussis NOT DETECTED NOT DETECTED Final   Chlamydophila pneumoniae NOT DETECTED NOT DETECTED Final   Mycoplasma pneumoniae NOT DETECTED NOT DETECTED Final    Comment: Performed at [facility], [address] [place], [place] [address]  CULTURE, BLOOD (ROUTINE X 2) w Reflex to ID Panel     Status: None (Preliminary result)   Collection Time: 12/28/20  6:14 PM   Specimen: BLOOD  Result Value Ref Range Status   Specimen Description BLOOD BLOOD LEFT HAND  Final   Special Requests   Final    BOTTLES DRAWN AEROBIC AND ANAEROBIC Blood Culture adequate volume   Culture   Final    NO GROWTH 3 DAYS Performed at [facility], [address], [place], [place] [address]    Report Status PENDING  Incomplete   CULTURE, BLOOD (ROUTINE X 2) w Reflex to ID Panel     Status: None (Preliminary result)   Collection Time: 12/28/20  6:14 PM   Specimen: BLOOD  Result Value Ref Range Status   Specimen Description BLOOD BLOOD RIGHT HAND  Final   Special Requests   Final    BOTTLES DRAWN AEROBIC AND ANAEROBIC Blood Culture results may not be optimal due to an inadequate volume of blood received in culture bottles   Culture   Final    NO GROWTH 3 DAYS Performed at [facility], [address] [place], [place] [address]    Report Status PENDING  Incomplete  SARS CORONAVIRUS 2 (TAT 6-24 HRS) Nasopharyngeal Nasopharyngeal Swab     Status: None   Collection Time: 12/29/20  8:20 AM   Specimen: Nasopharyngeal Swab  Result Value Ref Range Status   SARS Coronavirus 2 NEGATIVE NEGATIVE Final    Comment: (NOTE) SARS-CoV-2 target nucleic acids are NOT DETECTED.  The SARS-CoV-2 RNA is generally detectable in upper and lower respiratory specimens during the acute phase of infection. Negative results do not preclude SARS-CoV-2 infection, do not rule out co-infections with other pathogens, and should not be used as the sole basis for treatment or other patient management decisions. Negative results must be combined with clinical observations, patient history, and epidemiological information. The expected result is Negative.  Fact Sheet for Patients: HairSlick.no  Fact Sheet for Healthcare Providers: quierodirigir.com  This test is not yet approved or cleared by the [place] FDA and  has been authorized for detection and/or diagnosis of SARS-CoV-2 by FDA under an Emergency Use Authorization (EUA). This EUA will remain  in effect (meaning this test can be used) for the duration of the COVID-19 declaration under Se ction 564(b)(1) of the Act, 21 U.S.C. section 360bbb-3(b)(1), unless the authorization is terminated or revoked  sooner.  Performed at [facility], [address], [place], [place] [address]   MRSA PCR Screening     Status: None   Collection Time: 12/30/20  5:00 PM  Result Value Ref Range Status   MRSA by PCR NEGATIVE NEGATIVE Final    Comment:        The GeneXpert MRSA Assay (FDA approved for NASAL specimens only), is one component of a comprehensive MRSA colonization surveillance program. It is not intended to diagnose MRSA infection nor to guide or monitor treatment for MRSA infections. Performed at [facility], [address] [place], [place] [address]     Radiology Studies: MR CERVICAL SPINE W  WO CONTRAST  Result Date: 12/30/2020 CLINICAL DATA:  Fever unknown origin. Sepsis. History of spinal cord infarct and paraplegia. EXAM: MRI CERVICAL SPINE WITHOUT AND WITH CONTRAST TECHNIQUE: Multiplanar and multiecho pulse sequences of the cervical spine, to include the craniocervical junction and cervicothoracic junction, were obtained without and with intravenous contrast. CONTRAST:  10mL GADAVIST GADOBUTROL 1 MMOL/ML IV SOLN COMPARISON:  None. FINDINGS: Alignment: Normal Vertebrae: ACDF C4 through C6. Posterior screw and rod fusion C5 through C7. Negative for fracture. No evidence of spinal infection Cord: Extensive atrophy of the cord with associated hyperintense signal at C6 and C7 compatible with chronic myelomalacia. No cord compression. Posterior Fossa, vertebral arteries, paraspinal tissues: Negative for soft tissue mass or abscess. Empty sella, incidentally noted. Disc levels: C2-3: Negative C3-4: Shallow central disc protrusion.  Negative for stenosis. C4-5: ACDF.  Negative for stenosis C5-6: ACDF with posterior fusion and laminectomy. Negative for stenosis C6-7: Posterior hardware fusion.  Negative for stenosis C7-T1: Negative IMPRESSION: No evidence of infection in the cervical spine. No abscess or discitis identified ACDF C4 through C6. Posterior hardware fusion C5 through  C7. Extensive myelomalacia and atrophy of the cord at C6 and C7. Electronically Signed   By: Marlan Palau M.D.   On: 12/30/2020 12:54   MR THORACIC SPINE W WO CONTRAST  Result Date: 12/30/2020 CLINICAL DATA:  Fever unknown origin.  Paraplegia. EXAM: MRI THORACIC WITHOUT AND WITH CONTRAST TECHNIQUE: Multiplanar and multiecho pulse sequences of the thoracic spine were obtained without and with intravenous contrast. CONTRAST:  10mL GADAVIST GADOBUTROL 1 MMOL/ML IV SOLN COMPARISON:  None. FINDINGS: Alignment:  Normal Vertebrae: Negative for fracture or mass. No evidence of spinal infection thoracic spine Cord: Limited cord evaluation due to motion. Allowing for this, no cord signal abnormality identified. Paraspinal and other soft tissues: Negative for paraspinous mass or abscess. No epidural abscess. Mild atelectasis in the lung bases. Disc levels: Mild disc degeneration in the thoracic spine. No disc protrusion or significant spinal stenosis. IMPRESSION: No source of infection in the thoracic spine.  No acute abnormality Electronically Signed   By: Marlan Palau M.D.   On: 12/30/2020 12:56   MR Lumbar Spine W Wo Contrast  Result Date: 12/30/2020 CLINICAL DATA:  Fever unknown origin.  Paraplegia EXAM: MRI LUMBAR SPINE WITHOUT AND WITH CONTRAST TECHNIQUE: Multiplanar and multiecho pulse sequences of the lumbar spine were obtained without and with intravenous contrast. CONTRAST:  10mL GADAVIST GADOBUTROL 1 MMOL/ML IV SOLN COMPARISON:  None. FINDINGS: Segmentation:  Normal Alignment:  Normal Vertebrae: Negative for fracture or mass. No evidence of spinal infection. Conus medullaris and cauda equina: Conus extends to the L1-2 level. Conus and cauda equina appear normal. Paraspinal and other soft tissues: Negative for paraspinous mass, adenopathy, or abscess. Disc levels: L1-2: Negative L2-3: Negative L3-4: Mild facet degeneration.  Negative for stenosis L4-5: Diffuse disc bulging and bilateral facet  degeneration. Negative for stenosis L5-S1: Small central disc protrusion.  Negative for stenosis. IMPRESSION: No acute abnormality.  No source of infection in the lumbar spine. Electronically Signed   By: Marlan Palau M.D.   On: 12/30/2020 12:59       Marlin Canary DO [facility]  If 7PM-7AM, please contact night-coverage [email or web address] 12/31/2020, 11:35 AM

## 2021-01-01 DIAGNOSIS — N390 Urinary tract infection, site not specified: Secondary | ICD-10-CM | POA: Diagnosis not present

## 2021-01-01 DIAGNOSIS — G9511 Acute infarction of spinal cord (embolic) (nonembolic): Secondary | ICD-10-CM | POA: Diagnosis not present

## 2021-01-01 DIAGNOSIS — R509 Fever, unspecified: Secondary | ICD-10-CM | POA: Diagnosis not present

## 2021-01-01 DIAGNOSIS — T83511A Infection and inflammatory reaction due to indwelling urethral catheter, initial encounter: Secondary | ICD-10-CM | POA: Diagnosis not present

## 2021-01-01 MED ORDER — GUAIFENESIN-DM 100-10 MG/5ML PO SYRP
5.0000 mL | ORAL_SOLUTION | ORAL | Status: DC | PRN
  Administered 2021-01-01: 5 mL via ORAL
  Filled 2021-01-01: qty 5

## 2021-01-01 NOTE — Progress Notes (Signed)
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  Reportedly febrile to 103 at nursing facility and started on Keflex for presumed UTI. Stay complicated by continued fevers until abx broadened out. Suspected pyelo and plan to d/c on levaquin PO when SNF bed available.    Subjective: Ordered a sandwich for breakfast as she thought it was night time   Objective: Vitals:   01/01/21 0044 01/01/21 0444 01/01/21 0822 01/01/21 0907  BP: 98/72 115/76 118/74 123/82  Pulse: 82 79 78 78  Resp: 16 16 20 16   Temp: 98 F (36.7 C) 97.8 F (36.6 C) 98.3 F (36.8 C) 97.8 F (36.6 C)  TempSrc:      SpO2: 97% 100% 99% 100%  Weight:      Height:        Intake/Output Summary (Last 24 hours) at 01/01/2021 0950 Last data filed at 01/01/2021 0600 Gross per 24 hour  Intake 400 ml  Output 1375 ml  Net -975 ml   Filed Weights   12/23/20 1610  Weight: 116.3 kg    Examination:  General: Appearance:    Obese female in no acute distress     Lungs:     respirations unlabored  Heart:    Normal heart rate.       Neurologic:   Awake, alert, oriented x 3      Microbiology summarized: COVID-19 PCR negative. Blood cultures NGTD. Urine culture with 10,000 colonies Proteus mirabilis. RVP panel negative.  Assessment & Plan: Sepsis thought due to catheter associated UTI in patient with chronic indwelling Foley plus FUO -started on Keflex outpatient.  UA with moderate LE and rare bacteria.  Culture data as above.   -Foley replaced on admission. -restarted abx 6/15- plan to de-escalate upon  d/c back to SNF -blood cultures -6/16: pan-scan- will start with CT scan chest/abd/pelvis: ? PNA and ? Pyelo-- MRI negative -recheck COVID neative -ID consult appreciated: DC vanco as blood culture neg, MRSA nares neg Continue cefepime, change to Levaquin on discharge until 01/04/21   BLE diplegia/spinal cord injury/chronic pain -Continue home pain medications-Lyrica, tizanidine, Mobic and as needed oxy  Anxiety/depression: Stable. -Continue home Cymbalta  Neuropathy-continue Lyrica  History of CVA: -Continue statin and aspirin.  Morbid obesity Body mass index is 42.67 kg/m.         DVT prophylaxis:    Subcu Lovenox  Code Status: Full code Level of care: Med-Surg Status is: Inpatient  Remains inpatient appropriate because:IV treatments appropriate due to intensity of illness or inability to take PO and Inpatient level of care appropriate due to severity of illness  Dispo: The patient is from:  LTF              Anticipated d/c is to: prior facility with PT?              Patient currently is medically stable to d/c.   Difficult to place patient No    Consultants:  Infectious disease  Sch Meds:  Scheduled Meds:  acamprosate  333 mg Oral TID   vitamin C  500 mg Oral BID   aspirin EC  325  mg Oral Daily   atorvastatin  10 mg Oral Daily   Chlorhexidine Gluconate Cloth  6 each Topical Daily   dicyclomine  10 mg Oral TID WC   DULoxetine  60 mg Oral Daily   enoxaparin (LOVENOX) injection  60 mg Subcutaneous Q24H   multivitamin with minerals  1 tablet Oral Daily   nystatin   Topical BID   pantoprazole  80 mg Oral Daily   polyethylene glycol  17 g Oral BID   pregabalin  100 mg Oral BID   tiZANidine  2 mg Oral BID   Continuous Infusions:  sodium chloride Stopped (01/01/21 0034)   ceFEPime (MAXIPIME) IV 2 g (01/01/21 0610)   PRN Meds:.sodium chloride, acetaminophen **OR** acetaminophen, bisacodyl, guaiFENesin-dextromethorphan, ibuprofen, LORazepam, methocarbamol,  ondansetron **OR** ondansetron (ZOFRAN) IV, oxyCODONE  Antimicrobials: Anti-infectives (From admission, onward)    Start     Dose/Rate Route Frequency Ordered Stop   12/31/20 0800  vancomycin (VANCOREADY) IVPB 2000 mg/400 mL  Status:  Discontinued        2,000 mg 200 mL/hr over 120 Minutes Intravenous Every 24 hours 12/30/20 1418 12/30/20 2129   12/29/20 1800  vancomycin (VANCOCIN) IVPB 1000 mg/200 mL premix  Status:  Discontinued        1,000 mg 200 mL/hr over 60 Minutes Intravenous Every 12 hours 12/29/20 1026 12/30/20 1418   12/29/20 0600  vancomycin (VANCOREADY) IVPB 1000 mg/200 mL  Status:  Discontinued        1,000 mg 200 mL/hr over 60 Minutes Intravenous Every 12 hours 12/28/20 1728 12/29/20 1025   12/28/20 1900  ceFEPIme (MAXIPIME) 2 g in sodium chloride 0.9 % 100 mL IVPB        2 g 200 mL/hr over 30 Minutes Intravenous Every 8 hours 12/28/20 1727     12/28/20 1815  vancomycin (VANCOREADY) IVPB 2000 mg/400 mL        2,000 mg 200 mL/hr over 120 Minutes Intravenous  Once 12/28/20 1727 12/29/20 0351   12/26/20 2200  ceFAZolin (ANCEF) 1 g in sodium chloride 0.9 % 100 mL IVPB  Status:  Discontinued        1 g 200 mL/hr over 30 Minutes Intravenous Every 8 hours 12/26/20 1550 12/28/20 0839   12/25/20 1400  ceFAZolin (ANCEF) IVPB 1 g/50 mL premix  Status:  Discontinued        1 g 100 mL/hr over 30 Minutes Intravenous Every 8 hours 12/25/20 1214 12/26/20 1550   12/24/20 0500  ceFEPIme (MAXIPIME) 2 g in sodium chloride 0.9 % 100 mL IVPB  Status:  Discontinued        2 g 200 mL/hr over 30 Minutes Intravenous Every 12 hours 12/23/20 1504 12/23/20 1619   12/23/20 1700  ceFEPIme (MAXIPIME) 2 g in sodium chloride 0.9 % 100 mL IVPB  Status:  Discontinued        2 g 200 mL/hr over 30 Minutes Intravenous Every 8 hours 12/23/20 1619 12/25/20 1214   12/23/20 1600  ceFEPIme (MAXIPIME) 2 g in sodium chloride 0.9 % 100 mL IVPB  Status:  Discontinued        2 g 200 mL/hr over 30 Minutes  Intravenous  Once 12/23/20 1501 12/23/20 1619   12/23/20 1045  cefTRIAXone (ROCEPHIN) 1 g in sodium chloride 0.9 % 100 mL IVPB        1 g 200 mL/hr over 30 Minutes Intravenous  Once 12/23/20 1035 12/23/20 1230        I have personally reviewed the following  labs and images: CBC: Recent Labs  Lab 12/26/20 1541 12/28/20 0529 12/29/20 0541 12/30/20 0425  WBC 8.5 13.2* 12.1* 10.1  NEUTROABS  --   --  7.0  --   HGB 11.0* 10.9* 10.8* 10.8*  HCT 33.7* 32.8* 32.3* 34.1*  MCV 85.5 84.5 85.2 89.3  PLT 325 452* 451* 512*   BMP &GFR Recent Labs  Lab 12/26/20 1541 12/28/20 0529 12/30/20 0425  NA 135 132* 137  K 4.7 4.4 4.5  CL 97* 94* 106  CO2 32 28 24  GLUCOSE 137* 96 92  BUN 13 14 14   CREATININE 0.79 0.74 0.60  CALCIUM 9.1 9.0 9.1  MG 1.9 1.9  --   PHOS 3.8 3.6  --    Estimated Creatinine Clearance: 103.6 mL/min (by C-G formula based on SCr of 0.6 mg/dL). Liver & Pancreas: Recent Labs  Lab 12/26/20 1541 12/28/20 0529  AST  --  27  ALT  --  20  ALKPHOS  --  100  BILITOT  --  0.5  PROT  --  7.0  ALBUMIN 2.7* 2.9*  2.9*   No results for input(s): LIPASE, AMYLASE in the last 168 hours. No results for input(s): AMMONIA in the last 168 hours. Diabetic: No results for input(s): HGBA1C in the last 72 hours. No results for input(s): GLUCAP in the last 168 hours. Cardiac Enzymes: No results for input(s): CKTOTAL, CKMB, CKMBINDEX, TROPONINI in the last 168 hours. No results for input(s): PROBNP in the last 8760 hours. Coagulation Profile: No results for input(s): INR, PROTIME in the last 168 hours.  Thyroid Function Tests: No results for input(s): TSH, T4TOTAL, FREET4, T3FREE, THYROIDAB in the last 72 hours. Lipid Profile: No results for input(s): CHOL, HDL, LDLCALC, TRIG, CHOLHDL, LDLDIRECT in the last 72 hours. Anemia Panel: No results for input(s): VITAMINB12, FOLATE, FERRITIN, TIBC, IRON, RETICCTPCT in the last 72 hours.  Urine analysis:    Component Value  Date/Time   COLORURINE STRAW (A) 12/23/2020 1026   APPEARANCEUR CLEAR (A) 12/23/2020 1026   LABSPEC 1.003 (L) 12/23/2020 1026   PHURINE 7.0 12/23/2020 1026   GLUCOSEU NEGATIVE 12/23/2020 1026   HGBUR NEGATIVE 12/23/2020 1026   BILIRUBINUR NEGATIVE 12/23/2020 1026   KETONESUR NEGATIVE 12/23/2020 1026   PROTEINUR NEGATIVE 12/23/2020 1026   NITRITE NEGATIVE 12/23/2020 1026   LEUKOCYTESUR MODERATE (A) 12/23/2020 1026   Sepsis Labs: Invalid input(s): PROCALCITONIN, LACTICIDVEN  Microbiology: Recent Results (from the past 240 hour(s))  Culture, blood (Routine x 2)     Status: None   Collection Time: 12/23/20 10:25 AM   Specimen: BLOOD LEFT HAND  Result Value Ref Range Status   Specimen Description BLOOD LEFT HAND  Final   Special Requests   Final    BOTTLES DRAWN AEROBIC AND ANAEROBIC Blood Culture adequate volume   Culture   Final    NO GROWTH 5 DAYS Performed at [facility], [address] [place], [place]    Report Status 12/28/2020 FINAL  Final  Culture, blood (Routine x 2)     Status: None   Collection Time: 12/23/20 10:30 AM   Specimen: Right Antecubital; Blood  Result Value Ref Range Status   Specimen Description RIGHT ANTECUBITAL  Final   Special Requests   Final    BOTTLES DRAWN AEROBIC AND ANAEROBIC Blood Culture adequate volume   Culture   Final    NO GROWTH 5 DAYS Performed at [facility], [address] [place], [place]    Report  Status 12/28/2020 FINAL  Final  Urine culture     Status: Abnormal   Collection Time: 12/23/20 10:36 AM   Specimen: Urine, Random  Result Value Ref Range Status   Specimen Description   Final    URINE, RANDOM Performed at [facility], [address] [place], [place] [address]    Special Requests   Final    NONE Performed at [facility], [address] [place], [place] [address]    Culture 10,000 COLONIES/mL PROTEUS MIRABILIS (A)  Final   Report Status 12/25/2020  FINAL  Final   Organism ID, Bacteria PROTEUS MIRABILIS (A)  Final      Susceptibility   Proteus mirabilis - MIC*    AMPICILLIN <=2 SENSITIVE Sensitive     CEFAZOLIN <=4 SENSITIVE Sensitive     CEFEPIME <=0.12 SENSITIVE Sensitive     CEFTRIAXONE <=0.25 SENSITIVE Sensitive     CIPROFLOXACIN <=0.25 SENSITIVE Sensitive     GENTAMICIN <=1 SENSITIVE Sensitive     IMIPENEM 1 SENSITIVE Sensitive     NITROFURANTOIN 128 RESISTANT Resistant     TRIMETH/SULFA <=20 SENSITIVE Sensitive     AMPICILLIN/SULBACTAM <=2 SENSITIVE Sensitive     PIP/TAZO <=4 SENSITIVE Sensitive     * 10,000 COLONIES/mL PROTEUS MIRABILIS  SARS CORONAVIRUS 2 (TAT 6-24 HRS) Nasopharyngeal Nasopharyngeal Swab     Status: None   Collection Time: 12/23/20  1:20 PM   Specimen: Nasopharyngeal Swab  Result Value Ref Range Status   SARS Coronavirus 2 NEGATIVE NEGATIVE Final    Comment: (NOTE) SARS-CoV-2 target nucleic acids are NOT DETECTED.  The SARS-CoV-2 RNA is generally detectable in upper and lower respiratory specimens during the acute phase of infection. Negative results do not preclude SARS-CoV-2 infection, do not rule out co-infections with other pathogens, and should not be used as the sole basis for treatment or other patient management decisions. Negative results must be combined with clinical observations, patient history, and epidemiological information. The expected result is Negative.  Fact Sheet for Patients: HairSlick.no  Fact Sheet for Healthcare Providers: quierodirigir.com  This test is not yet approved or cleared by the [place] FDA and  has been authorized for detection and/or diagnosis of SARS-CoV-2 by FDA under an Emergency Use Authorization (EUA). This EUA will remain  in effect (meaning this test can be used) for the duration of the COVID-19 declaration under Se ction 564(b)(1) of the Act, 21 U.S.C. section 360bbb-3(b)(1), unless the  authorization is terminated or revoked sooner.  Performed at [facility], [address], [place], [place] [address]   Respiratory (~20 pathogens) panel by PCR     Status: None   Collection Time: 12/26/20 10:17 AM   Specimen: Nasopharyngeal Swab; Respiratory  Result Value Ref Range Status   Adenovirus NOT DETECTED NOT DETECTED Final   Coronavirus 229E NOT DETECTED NOT DETECTED Final    Comment: (NOTE) The Coronavirus on the Respiratory Panel, DOES NOT test for the novel  Coronavirus (2019 nCoV)    Coronavirus HKU1 NOT DETECTED NOT DETECTED Final   Coronavirus NL63 NOT DETECTED NOT DETECTED Final   Coronavirus OC43 NOT DETECTED NOT DETECTED Final   Metapneumovirus NOT DETECTED NOT DETECTED Final   Rhinovirus / Enterovirus NOT DETECTED NOT DETECTED Final   Influenza A NOT DETECTED NOT DETECTED Final   Influenza B NOT DETECTED NOT DETECTED Final   Parainfluenza Virus 1 NOT DETECTED NOT DETECTED Final   Parainfluenza Virus 2 NOT DETECTED NOT DETECTED Final   Parainfluenza Virus  3 NOT DETECTED NOT DETECTED Final   Parainfluenza Virus 4 NOT DETECTED NOT DETECTED Final   Respiratory Syncytial Virus NOT DETECTED NOT DETECTED Final   Bordetella pertussis NOT DETECTED NOT DETECTED Final   Bordetella Parapertussis NOT DETECTED NOT DETECTED Final   Chlamydophila pneumoniae NOT DETECTED NOT DETECTED Final   Mycoplasma pneumoniae NOT DETECTED NOT DETECTED Final    Comment: Performed at [facility], [address], [place], [place] [address]  CULTURE, BLOOD (ROUTINE X 2) w Reflex to ID Panel     Status: None (Preliminary result)   Collection Time: 12/28/20  6:14 PM   Specimen: BLOOD  Result Value Ref Range Status   Specimen Description BLOOD BLOOD LEFT HAND  Final   Special Requests   Final    BOTTLES DRAWN AEROBIC AND ANAEROBIC Blood Culture adequate volume   Culture   Final    NO GROWTH 4 DAYS Performed at [facility], [address], [place], [place] [address]     Report Status PENDING  Incomplete  CULTURE, BLOOD (ROUTINE X 2) w Reflex to ID Panel     Status: None (Preliminary result)   Collection Time: 12/28/20  6:14 PM   Specimen: BLOOD  Result Value Ref Range Status   Specimen Description BLOOD BLOOD RIGHT HAND  Final   Special Requests   Final    BOTTLES DRAWN AEROBIC AND ANAEROBIC Blood Culture results may not be optimal due to an inadequate volume of blood received in culture bottles   Culture   Final    NO GROWTH 4 DAYS Performed at [facility], [address] [place], [place] [address]    Report Status PENDING  Incomplete  SARS CORONAVIRUS 2 (TAT 6-24 HRS) Nasopharyngeal Nasopharyngeal Swab     Status: None   Collection Time: 12/29/20  8:20 AM   Specimen: Nasopharyngeal Swab  Result Value Ref Range Status   SARS Coronavirus 2 NEGATIVE NEGATIVE Final    Comment: (NOTE) SARS-CoV-2 target nucleic acids are NOT DETECTED.  The SARS-CoV-2 RNA is generally detectable in upper and lower respiratory specimens during the acute phase of infection. Negative results do not preclude SARS-CoV-2 infection, do not rule out co-infections with other pathogens, and should not be used as the sole basis for treatment or other patient management decisions. Negative results must be combined with clinical observations, patient history, and epidemiological information. The expected result is Negative.  Fact Sheet for Patients: HairSlick.no  Fact Sheet for Healthcare Providers: quierodirigir.com  This test is not yet approved or cleared by the [place] FDA and  has been authorized for detection and/or diagnosis of SARS-CoV-2 by FDA under an Emergency Use Authorization (EUA). This EUA will remain  in effect (meaning this test can be used) for the duration of the COVID-19 declaration under Se ction 564(b)(1) of the Act, 21 U.S.C. section 360bbb-3(b)(1), unless the authorization is  terminated or revoked sooner.  Performed at [facility], [address] [place], [place] [address]   MRSA PCR Screening     Status: None   Collection Time: 12/30/20  5:00 PM  Result Value Ref Range Status   MRSA by PCR NEGATIVE NEGATIVE Final    Comment:        The GeneXpert MRSA Assay (FDA approved for NASAL specimens only), is one component of a comprehensive MRSA colonization surveillance program. It is not intended to diagnose MRSA infection nor to guide or monitor treatment for MRSA infections. Performed at [facility], [address], [place], [place] [address]     Radiology Studies: No results found.     Marlin Canary DO [facility]  If 7PM-7AM, please contact night-coverage [email or web address] 01/01/2021, 9:50 AM

## 2021-01-02 DIAGNOSIS — N390 Urinary tract infection, site not specified: Secondary | ICD-10-CM | POA: Diagnosis not present

## 2021-01-02 DIAGNOSIS — T83511A Infection and inflammatory reaction due to indwelling urethral catheter, initial encounter: Secondary | ICD-10-CM | POA: Diagnosis not present

## 2021-01-02 DIAGNOSIS — G9511 Acute infarction of spinal cord (embolic) (nonembolic): Secondary | ICD-10-CM | POA: Diagnosis not present

## 2021-01-02 DIAGNOSIS — R509 Fever, unspecified: Secondary | ICD-10-CM | POA: Diagnosis not present

## 2021-01-02 LAB — CULTURE, BLOOD (ROUTINE X 2)
Culture: NO GROWTH
Culture: NO GROWTH
Special Requests: ADEQUATE

## 2021-01-02 MED ORDER — NYSTATIN 100000 UNIT/GM EX POWD
Freq: Two times a day (BID) | CUTANEOUS | 0 refills | Status: DC
Start: 2021-01-02 — End: 2023-01-12

## 2021-01-02 MED ORDER — LEVOFLOXACIN 750 MG PO TABS: 750.0000 mg | ORAL_TABLET | Freq: Every day | ORAL | 0 refills | Status: AC

## 2021-01-02 MED ORDER — BISACODYL 10 MG RE SUPP
10.0000 mg | Freq: Every day | RECTAL | 0 refills | Status: DC | PRN
Start: 2021-01-02 — End: 2023-07-22

## 2021-01-02 MED ORDER — METHOCARBAMOL 500 MG PO TABS: 500.0000 mg | ORAL_TABLET | Freq: Four times a day (QID) | ORAL | Status: DC | PRN

## 2021-01-02 NOTE — Discharge Summary (Signed)
Physician Discharge Summary  Wendy Nelson INO:[id number] DOB: [birth year] DOA: 12/23/2020  PCP: [facility]  Admit date: 12/23/2020 Discharge date: 01/02/2021  Admitted From: snf Discharge disposition: snf   Recommendations for Outpatient Follow-Up:   Levaquin through 6/22 Foley care   Discharge Diagnosis:   Active Problems:   Cervical spondylosis with myelopathy and radiculopathy   Spinal cord infarction [facility])   Depression with anxiety   Neurogenic bladder   Sepsis (HCC)    Discharge Condition: Improved.  Diet recommendation: Low sodium, heart healthy.   Wound care: None.  Code status: Full.   History of Present Illness:   Wendy Nelson is a 55 y.o. 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   Patient reports that for the last 3 days she has had a fever.  She reports that she lives at [facility] facility in [place].  Reports that they did many test to try and figure out why she was having fevers but did not find any source.  She currently denies any congestion, runny nose, cough, shortness of breath, chest pain, abdominal pain.  She denies any new bedsores that she is aware of.  She reports that due to a neurogenic bladder from her spinal cord injury she has had a chronic indwelling Foley for some time now.   Per review of documents from nursing facility patient had temperature as high as 103 Fahrenheit, and has been started on a course of Keflex for presumed urinary tract infection.   In the ED initial vital signs notable for temperature of 100.3 Fahrenheit, tachycardia to 107, and tachypnea to the low 20s, with remainder of vital signs normal.  Lab work-up notable for mild leukocytosis of 11.6 and anemia with hemoglobin of 10.9, remainder  of laboratory work-up largely unremarkable including CMP, coags, EKG, and UA.  Chest x-ray was obtained which showed stable mild cardiomegaly but no other findings.  A CT abdomen pelvis was also obtained which showed suggestion of bladder wall thickening indicative of acute cystitis but no other acute findings.  She was presumed to have a urinary source of infection, and given 1 g ceftriaxone as well as a 1 L LR bolus.     Hospital Course by Problem:   Sepsis thought due to catheter associated UTI in patient with chronic indwelling Foley plus FUO -started on Keflex outpatient.  UA with moderate LE and rare bacteria.  Culture data as above.   -Foley replaced on admission. -restarted abx 6/15- plan to de-escalate upon d/c back to SNF -blood cultures -6/16: pan-scan- will start with CT scan chest/abd/pelvis: ? PNA and ? Pyelo-- MRI negative -recheck COVID neative -ID consult appreciated: DC vanco as blood culture neg, MRSA nares neg Continue cefepime, change to Levaquin on discharge until 01/04/21   BLE diplegia/spinal cord injury/chronic pain -Continue home pain medications-Lyrica, tizanidine  Anxiety/depression: Stable. -Continue home Cymbalta  Neuropathy-continue Lyrica  History of CVA: -Continue statin and aspirin.   Morbid obesity Body mass index is 42.67 kg/m    Medical Consultants:   ID   Discharge Exam:   Vitals:   01/02/21 0407 01/02/21 0906  BP: 115/76 123/75  Pulse: 69 72  Resp: 16 18  Temp: 97.9 F (36.6 C) 97.9 F (36.6 C)  SpO2: 99% 100%   Vitals:   01/01/21 2340 01/02/21 0110  01/02/21 0407 01/02/21 0906  BP: (!) 82/55 106/74 115/76 123/75  Pulse: 80 73 69 72  Resp: 17  16 18   Temp: 98.3 F (36.8 C)  97.9 F (36.6 C) 97.9 F (36.6 C)  TempSrc:      SpO2: 100%  99% 100%  Weight:      Height:        General exam: Appears calm and comfortable   The results of significant diagnostics from this hospitalization (including imaging, microbiology,  ancillary and laboratory) are listed below for reference.     Procedures and Diagnostic Studies:   CT ABDOMEN PELVIS WO CONTRAST  Result Date: 12/23/2020 CLINICAL DATA:  Abdominal pain with fever. Recent urinary tract infection diagnosis. Possible sepsis. EXAM: CT ABDOMEN AND PELVIS WITHOUT CONTRAST TECHNIQUE: Multidetector CT imaging of the abdomen and pelvis was performed following the standard protocol without IV contrast. COMPARISON:  Abdominopelvic CT 10/19/2020. FINDINGS: Lower chest: Stable bibasilar atelectasis or scarring. No significant pleural or pericardial effusion. Hepatobiliary: No focal hepatic abnormalities are identified on noncontrast imaging. No evidence of gallstones, gallbladder wall thickening or biliary dilatation. Pancreas: Unremarkable. No pancreatic ductal dilatation or surrounding inflammatory changes. Spleen: Normal in size without focal abnormality. Adrenals/Urinary Tract: Both adrenal glands appear normal. Both kidneys appear unremarkable as imaged in the noncontrast state. There is no evidence of urinary tract calculus, hydronephrosis or perinephric soft tissue stranding. A Foley catheter is in place. The bladder is decompressed with possible mild wall thickening and surrounding inflammation. Stomach/Bowel: No enteric contrast administered. The stomach appears unremarkable for its degree of distension. No evidence of bowel wall thickening, distention or surrounding inflammatory change. The appendix appears normal. There is stool throughout the colon. Vascular/Lymphatic: There are no enlarged abdominal or pelvic lymph nodes. Mild aortic and branch vessel atherosclerosis. Reproductive: The uterus and ovaries appear unremarkable. No adnexal mass. Other: No evidence of abdominal wall mass or hernia. No ascites. Musculoskeletal: No acute or significant osseous findings. Moderate facet arthropathy at L4-5 and chronic bilateral sacroiliitis, similar to previous study. IMPRESSION: 1.  The urinary bladder is decompressed by a Foley catheter and suboptimally evaluated. There is possible bladder wall thickening and surrounding inflammation which could indicate cystitis. 2. No evidence of urinary tract calculus, hydronephrosis or perinephric inflammation. 3. No evidence of bowel inflammation or obstruction. 4. Chronic sacroiliitis and facet arthropathy in the lower lumbar spine. 5.  Aortic Atherosclerosis (ICD10-I70.0). Electronically Signed   By: Carey BullocksWilliam  Veazey M.D.   On: 12/23/2020 11:47   DG Chest 2 View  Result Date: 12/23/2020 CLINICAL DATA:  Suspected sepsis.  Recent UTI diagnosis. EXAM: CHEST - 2 VIEW COMPARISON:  Radiographs 08/15/2018. FINDINGS: Suboptimal inspiration on the lateral view. No focal airspace disease is seen on the frontal examination. There is stable mild cardiac enlargement. No pleural effusion or pneumothorax. Previous lower cervical fusion without evidence of acute osseous abnormality. Telemetry leads overlie the chest. IMPRESSION: No evidence of active cardiopulmonary process. Stable mild cardiomegaly. Electronically Signed   By: Carey BullocksWilliam  Veazey M.D.   On: 12/23/2020 11:40     Labs:   Basic Metabolic Panel: Recent Labs  Lab 12/26/20 1541 12/28/20 0529 12/30/20 0425  NA 135 132* 137  K 4.7 4.4 4.5  CL 97* 94* 106  CO2 32 28 24  GLUCOSE 137* 96 92  BUN 13 14 14   CREATININE 0.79 0.74 0.60  CALCIUM 9.1 9.0 9.1  MG 1.9 1.9  --   PHOS 3.8 3.6  --    GFR Estimated Creatinine Clearance:  103.6 mL/min (by C-G formula based on SCr of 0.6 mg/dL). Liver Function Tests: Recent Labs  Lab 12/26/20 1541 12/28/20 0529  AST  --  27  ALT  --  20  ALKPHOS  --  100  BILITOT  --  0.5  PROT  --  7.0  ALBUMIN 2.7* 2.9*  2.9*   No results for input(s): LIPASE, AMYLASE in the last 168 hours. No results for input(s): AMMONIA in the last 168 hours. Coagulation profile No results for input(s): INR, PROTIME in the last 168 hours.  CBC: Recent Labs  Lab  12/26/20 1541 12/28/20 0529 12/29/20 0541 12/30/20 0425  WBC 8.5 13.2* 12.1* 10.1  NEUTROABS  --   --  7.0  --   HGB 11.0* 10.9* 10.8* 10.8*  HCT 33.7* 32.8* 32.3* 34.1*  MCV 85.5 84.5 85.2 89.3  PLT 325 452* 451* 512*   Cardiac Enzymes: No results for input(s): CKTOTAL, CKMB, CKMBINDEX, TROPONINI in the last 168 hours. BNP: Invalid input(s): POCBNP CBG: No results for input(s): GLUCAP in the last 168 hours. D-Dimer No results for input(s): DDIMER in the last 72 hours. Hgb A1c No results for input(s): HGBA1C in the last 72 hours. Lipid Profile No results for input(s): CHOL, HDL, LDLCALC, TRIG, CHOLHDL, LDLDIRECT in the last 72 hours. Thyroid function studies No results for input(s): TSH, T4TOTAL, T3FREE, THYROIDAB in the last 72 hours.  Invalid input(s): FREET3 Anemia work up No results for input(s): VITAMINB12, FOLATE, FERRITIN, TIBC, IRON, RETICCTPCT in the last 72 hours. Microbiology Recent Results (from the past 240 hour(s))  SARS CORONAVIRUS 2 (TAT 6-24 HRS) Nasopharyngeal Nasopharyngeal Swab     Status: None   Collection Time: 12/23/20  1:20 PM   Specimen: Nasopharyngeal Swab  Result Value Ref Range Status   SARS Coronavirus 2 NEGATIVE NEGATIVE Final    Comment: (NOTE) SARS-CoV-2 target nucleic acids are NOT DETECTED.  The SARS-CoV-2 RNA is generally detectable in upper and lower respiratory specimens during the acute phase of infection. Negative results do not preclude SARS-CoV-2 infection, do not rule out co-infections with other pathogens, and should not be used as the sole basis for treatment or other patient management decisions. Negative results must be combined with clinical observations, patient history, and epidemiological information. The expected result is Negative.  Fact Sheet for Patients: HairSlick.no  Fact Sheet for Healthcare Providers: quierodirigir.com  This test is not yet approved or  cleared by the [place] FDA and  has been authorized for detection and/or diagnosis of SARS-CoV-2 by FDA under an Emergency Use Authorization (EUA). This EUA will remain  in effect (meaning this test can be used) for the duration of the COVID-19 declaration under Se ction 564(b)(1) of the Act, 21 U.S.C. section 360bbb-3(b)(1), unless the authorization is terminated or revoked sooner.  Performed at [facility], [address] [place], [place] [address]   Respiratory (~20 pathogens) panel by PCR     Status: None   Collection Time: 12/26/20 10:17 AM   Specimen: Nasopharyngeal Swab; Respiratory  Result Value Ref Range Status   Adenovirus NOT DETECTED NOT DETECTED Final   Coronavirus 229E NOT DETECTED NOT DETECTED Final    Comment: (NOTE) The Coronavirus on the Respiratory Panel, DOES NOT test for the novel  Coronavirus (2019 nCoV)    Coronavirus HKU1 NOT DETECTED NOT DETECTED Final   Coronavirus NL63 NOT DETECTED NOT DETECTED Final   Coronavirus OC43 NOT DETECTED NOT DETECTED Final   Metapneumovirus NOT DETECTED NOT DETECTED Final  Rhinovirus / Enterovirus NOT DETECTED NOT DETECTED Final   Influenza A NOT DETECTED NOT DETECTED Final   Influenza B NOT DETECTED NOT DETECTED Final   Parainfluenza Virus 1 NOT DETECTED NOT DETECTED Final   Parainfluenza Virus 2 NOT DETECTED NOT DETECTED Final   Parainfluenza Virus 3 NOT DETECTED NOT DETECTED Final   Parainfluenza Virus 4 NOT DETECTED NOT DETECTED Final   Respiratory Syncytial Virus NOT DETECTED NOT DETECTED Final   Bordetella pertussis NOT DETECTED NOT DETECTED Final   Bordetella Parapertussis NOT DETECTED NOT DETECTED Final   Chlamydophila pneumoniae NOT DETECTED NOT DETECTED Final   Mycoplasma pneumoniae NOT DETECTED NOT DETECTED Final    Comment: Performed at [facility], [address], [place], [place] [address]  CULTURE, BLOOD (ROUTINE X 2) w Reflex to ID Panel     Status: None (Preliminary result)    Collection Time: 12/28/20  6:14 PM   Specimen: BLOOD  Result Value Ref Range Status   Specimen Description BLOOD BLOOD LEFT HAND  Final   Special Requests   Final    BOTTLES DRAWN AEROBIC AND ANAEROBIC Blood Culture adequate volume   Culture   Final    NO GROWTH 4 DAYS Performed at [facility], [address], [place], [place] [address]    Report Status PENDING  Incomplete  CULTURE, BLOOD (ROUTINE X 2) w Reflex to ID Panel     Status: None (Preliminary result)   Collection Time: 12/28/20  6:14 PM   Specimen: BLOOD  Result Value Ref Range Status   Specimen Description BLOOD BLOOD RIGHT HAND  Final   Special Requests   Final    BOTTLES DRAWN AEROBIC AND ANAEROBIC Blood Culture results may not be optimal due to an inadequate volume of blood received in culture bottles   Culture   Final    NO GROWTH 4 DAYS Performed at [facility], [address] [place], [place] [address]    Report Status PENDING  Incomplete  SARS CORONAVIRUS 2 (TAT 6-24 HRS) Nasopharyngeal Nasopharyngeal Swab     Status: None   Collection Time: 12/29/20  8:20 AM   Specimen: Nasopharyngeal Swab  Result Value Ref Range Status   SARS Coronavirus 2 NEGATIVE NEGATIVE Final    Comment: (NOTE) SARS-CoV-2 target nucleic acids are NOT DETECTED.  The SARS-CoV-2 RNA is generally detectable in upper and lower respiratory specimens during the acute phase of infection. Negative results do not preclude SARS-CoV-2 infection, do not rule out co-infections with other pathogens, and should not be used as the sole basis for treatment or other patient management decisions. Negative results must be combined with clinical observations, patient history, and epidemiological information. The expected result is Negative.  Fact Sheet for Patients: HairSlick.no  Fact Sheet for Healthcare Providers: quierodirigir.com  This test is not yet approved or cleared by  the [place] FDA and  has been authorized for detection and/or diagnosis of SARS-CoV-2 by FDA under an Emergency Use Authorization (EUA). This EUA will remain  in effect (meaning this test can be used) for the duration of the COVID-19 declaration under Se ction 564(b)(1) of the Act, 21 U.S.C. section 360bbb-3(b)(1), unless the authorization is terminated or revoked sooner.  Performed at [facility], [address], [place], [place] [address]   MRSA PCR Screening     Status: None   Collection Time: 12/30/20  5:00 PM  Result Value Ref Range Status   MRSA by PCR NEGATIVE NEGATIVE Final  Comment:        The GeneXpert MRSA Assay (FDA approved for NASAL specimens only), is one component of a comprehensive MRSA colonization surveillance program. It is not intended to diagnose MRSA infection nor to guide or monitor treatment for MRSA infections. Performed at [facility], [address] [place], [place] [address]      Discharge Instructions:   Discharge Instructions     Diet - low sodium heart healthy   Complete by: As directed    Increase activity slowly   Complete by: As directed       Allergies as of 01/02/2021       Reactions   Melatonin    Neomycin Other (See Comments)   Positive patch test   Sulfa Antibiotics         Medication List     STOP taking these medications    buprenorphine 2 MG Subl SL tablet Commonly known as: SUBUTEX   fluorometholone 0.1 % ophthalmic suspension Commonly known as: FML   meloxicam 7.5 MG tablet Commonly known as: MOBIC   midodrine 10 MG tablet Commonly known as: PROAMATINE   oxyCODONE-acetaminophen 5-325 MG tablet Commonly known as: PERCOCET/ROXICET   traZODone 50 MG tablet Commonly known as: DESYREL       TAKE these medications    acamprosate 333 MG tablet Commonly known as: CAMPRAL Take 333 mg by mouth 3 (three) times daily.   Acetaminophen 500 MG capsule Take 1,000 mg by mouth  every 6 (six) hours as needed.   aspirin 325 MG tablet Take 325 mg by mouth daily.   atorvastatin 10 MG tablet Commonly known as: LIPITOR Take 10 mg by mouth daily.   bisacodyl 10 MG suppository Commonly known as: DULCOLAX Place 1 suppository (10 mg total) rectally daily as needed for moderate constipation.   dicyclomine 10 MG capsule Commonly known as: BENTYL Take 10 mg by mouth 3 (three) times daily with meals.   DULoxetine 30 MG capsule Commonly known as: CYMBALTA Take 60 mg by mouth daily.   levofloxacin 750 MG tablet Commonly known as: Levaquin Take 1 tablet (750 mg total) by mouth daily for 2 days.   lidocaine 5 % Commonly known as: LIDODERM Place 1 patch onto the skin daily. Remove & Discard patch within 12 hours or as directed by MD   methocarbamol 500 MG tablet Commonly known as: ROBAXIN Take 1 tablet (500 mg total) by mouth every 6 (six) hours as needed for muscle spasms.   MiraLax 17 g packet Generic drug: polyethylene glycol Take 17 g by mouth daily.   multivitamin tablet Take 1 tablet by mouth daily.   nystatin powder Commonly known as: MYCOSTATIN/NYSTOP Apply topically 2 (two) times daily.   omeprazole 40 MG capsule Commonly known as: PRILOSEC Take 1 capsule by mouth 2 (two) times daily. What changed: Another medication with the same name was removed. Continue taking this medication, and follow the directions you see here.   pregabalin 100 MG capsule Commonly known as: LYRICA Take 1 capsule by mouth 2 (two) times daily.   senna-docusate 8.6-50 MG tablet Commonly known as: Senokot-S Take 1 tablet by mouth daily.   tiZANidine 2 MG tablet Commonly known as: ZANAFLEX Take 2 mg by mouth 2 (two) times daily.   vitamin C 500 MG tablet Commonly known as: ASCORBIC ACID Take 500 mg by mouth 2 (two) times daily.        Contact information for after-discharge care     Destination  [facility] Preferred SNF .   Service:  Skilled Nursing Contact information: [address] [place] [address] [phone]                      Time coordinating discharge: 35 min  Signed:  Joseph Art DO  [facility] 01/02/2021, 10:50 AM

## 2021-01-02 NOTE — TOC Progression Note (Addendum)
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 Will discharge pending authorization.   3:45pm Per [employer], Authorization denied, patient at facility under LTC and will not qualify for SNF. Patient to return to facility for LTC. Tina notified and patient to discharge tomorrow to [facility] for LTC.    Expected Discharge Plan: Skilled Nursing Facility Barriers to Discharge: Continued Medical Work up  Expected Discharge Plan and Services Expected Discharge Plan: Skilled Nursing Facility   Discharge Planning Services: CM Consult Post Acute Care Choice: Skilled Nursing Facility Living arrangements for the past 2 months: Skilled Nursing Facility Expected Discharge Date: 01/03/21               DME Arranged: N/A DME Agency: NA       HH Arranged: NA           Social Determinants of Health (SDOH) Interventions    Readmission Risk Interventions No flowsheet data found.

## 2021-01-02 NOTE — Progress Notes (Signed)
   Date of Admission:  12/23/2020    ID: Maurene Hollin is a 55 y.o. female  Active Problems:   Cervical spondylosis with myelopathy and radiculopathy   Spinal cord infarction [facility])   Depression with anxiety   Neurogenic bladder   Sepsis (HCC)    Subjective: Doing much better No fever > 3 days Eating better  Medications:   acamprosate  333 mg Oral TID   vitamin C  500 mg Oral BID   aspirin EC  325 mg Oral Daily   atorvastatin  10 mg Oral Daily   Chlorhexidine Gluconate Cloth  6 each Topical Daily   dicyclomine  10 mg Oral TID WC   DULoxetine  60 mg Oral Daily   enoxaparin (LOVENOX) injection  60 mg Subcutaneous Q24H   multivitamin with minerals  1 tablet Oral Daily   nystatin   Topical BID   pantoprazole  80 mg Oral Daily   polyethylene glycol  17 g Oral BID   pregabalin  100 mg Oral BID   tiZANidine  2 mg Oral BID    Objective: Vital signs in last 24 hours: Temp:  [97.7 F (36.5 C)-98.3 F (36.8 C)] 97.9 F (36.6 C) (06/20 1606) Pulse Rate:  [69-82] 76 (06/20 1606) Resp:  [16-18] 16 (06/20 1606) BP: (82-131)/(55-88) 131/88 (06/20 1606) SpO2:  [98 %-100 %] 100 % (06/20 1606)  PHYSICAL EXAM:  General: Alert, cooperative, no distress, appears stated age.  Head: Normocephalic, without obvious abnormality, atraumatic. Eyes: Conjunctivae clear, anicteric sclerae. Pupils are equal ENT Nares normal. No drainage or sinus tenderness. Lips, mucosa, and tongue normal. No Thrush Neck: Supple, symmetrical, no adenopathy, thyroid: non tender no carotid bruit and no JVD. Back: No CVA tenderness. Lungs: Clear to auscultation bilaterally. No Wheezing or Rhonchi. No rales. Heart: Regular rate and rhythm, no murmur, rub or gallop. Abdomen: Soft, non-tender,not distended. Bowel sounds normal. No masses Extremities: atraumatic, no cyanosis. No edema. No clubbing Skin: No rashes or lesions. Or bruising Lymph: Cervical, supraclavicular normal. Neurologic:  paraplegia foley   Microbiology: UC- proteus Studies/Results: No results found.   Assessment/Plan: Fever due to B/L pyelonephritis due to neurogenic bladder- on cefepime and fever has resolved MRI of the entire spine no infeciton No pneumonia No cellulitis No SSI SARS cov2 negative No intraabdominal source DVT screen neg On vanco and cefepime change to Levaquin on discharge until 01/04/21   Neurogenic bladder- has foley   Paraplegia  secondary to cervical cord infarction Discussed the management with patient  Pt is getting discharged today

## 2021-01-02 NOTE — Care Management Important Message (Signed)
Important Message  Patient Details  Name: Wendy Nelson MRN: [id number] Date of Birth: 1966-04-27   Medicare Important Message Given:  Yes     Olegario Messier A Elinore Shults 01/02/2021, 11:55 AM

## 2021-01-03 LAB — RESP PANEL BY RT-PCR (FLU A&B, COVID) ARPGX2
Influenza A by PCR: NEGATIVE
Influenza B by PCR: NEGATIVE
SARS Coronavirus 2 by RT PCR: NEGATIVE

## 2021-01-03 NOTE — Progress Notes (Signed)
Patient ready for discharge, EMS present to transport patient.  IV removed per discharge and site is tender, painful, drainage from site and a knot measuring approximately 3x1cm.  EMS left stating that patient needed to be cleared by MD prior to discharge.  MD messaged and spoke with primary RN.  Patient is already on antibiotics and warm compresses to be applied in facility, patient is still stable for discharge per Dr Benjamine Mola.  EMS will return for transport.  Will continue to monitor

## 2021-01-03 NOTE — Progress Notes (Signed)
Attempted to call [facility] to give report x2.

## 2021-01-03 NOTE — Progress Notes (Addendum)
Physical Therapy Treatment Patient Details Name: Wendy Nelson MRN: [id number] DOB: [birth year] Today's Date: 01/03/2021    History of Present Illness Pt is a 55 y.o. female presenting to hospital 6/10 from nursing facility with concerns for possible sepsis with some fevers and nausea today.  Pt admitted with sepsis (likely d/t urinary souce).  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    PT Comments    Patient agreeable to PT treatment session. Patient participated with RUE AAROM exercises. PROM and manual stretching performed to BLE with no purposeful active movement noted despite facilitation. Educated patient on the importance of routine repositioning and ROM of extremities for skin integrity and to maintain joint integrity. Patient continues to required total assistance for mobility. Recommend return to facility at discharge with continued physical therapy efforts.    Follow Up Recommendations  SNF     Equipment Recommendations  None recommended by PT    Recommendations for Other Services       Precautions / Restrictions Precautions Precautions: Fall Precaution Comments: Residual CVA deficits; chronic indwelling catheter Required Braces or Orthoses: Other Brace Other Brace: Cervical soft collar for comfort only (per facility soft cervical collar was discontinued) Restrictions Weight Bearing Restrictions: No    Mobility  Bed Mobility Overal bed mobility: Needs Assistance Bed Mobility: Rolling           General bed mobility comments: patient able to reach LUE across midline to grab bed rail on the right to facilitate rolling. total assistance is required for repositioning and rolling. educated and  encouraged patient reposition or she needs to direct nursing care for frequent repositioning to prevent against skin breakdown    Transfers                    Ambulation/Gait                 Stairs             Wheelchair Mobility    Modified Rankin (Stroke Patients Only)       Balance                                            Cognition Arousal/Alertness: Awake/alert Behavior During Therapy: Flat affect                                   General Comments: patient agreeable to PT and following all directions without difficulty      Exercises General Exercises - Upper Extremity Shoulder Flexion: AAROM;Strengthening;Right;10 reps;Supine (with HOB elevated) Elbow Flexion: AAROM;Strengthening;Right;10 reps;Supine (with head of bed elevated) Elbow Extension: AAROM;Right;10 reps;Supine (with head of bed elevated) General Exercises - Lower Extremity Ankle Circles/Pumps: PROM;Strengthening;10 reps;Supine;Both Heel Slides: PROM;Strengthening;5 reps;Supine;Both Hip ABduction/ADduction: PROM;Strengthening;Both;10 reps;Supine Other Exercises Other Exercises: facilitation provided to BLE with no active movement noted other that occasional trace left hip adduction that was not intentional. abdominal isometrics performed with cues for technique with trace movement noted proximally. manual heel cord stretch performed bilaterally x 30 seconds each with neutral ROM achieved    General Comments  Pertinent Vitals/Pain Pain Assessment: No/denies pain    Home Living                      Prior Function            PT Goals (current goals can now be found in the care plan section) Acute Rehab PT Goals Patient Stated Goal: to walk PT Goal Formulation: With patient Time For Goal Achievement: 01/10/21 Potential to Achieve Goals: Fair Progress towards PT goals:  (slow progress overall)    Frequency    Min  2X/week      PT Plan Current plan remains appropriate    Co-evaluation              AM-PAC PT "6 Clicks" Mobility   Outcome Measure  Help needed turning from your back to your side while in a flat bed without using bedrails?: Total Help needed moving from lying on your back to sitting on the side of a flat bed without using bedrails?: Total Help needed moving to and from a bed to a chair (including a wheelchair)?: Total Help needed standing up from a chair using your arms (e.g., wheelchair or bedside chair)?: Total Help needed to walk in hospital room?: Total Help needed climbing 3-5 steps with a railing? : Total 6 Click Score: 6    End of Session   Activity Tolerance: Patient tolerated treatment well Patient left: in bed;with call bell/phone within reach;with bed alarm set   PT Visit Diagnosis: Other abnormalities of gait and mobility (R26.89);Muscle weakness (generalized) (M62.81)     Time: 4970-2637 PT Time Calculation (min) (ACUTE ONLY): 41 min  Charges:  $Therapeutic Exercise: 23-37 mins $Therapeutic Activity: 8-22 mins                     Donna Bernard, PT, MPT    Ina Homes 01/03/2021, 11:11 AM

## 2021-01-03 NOTE — Progress Notes (Signed)
Tried to report to [facility] and no one would pick up. Patient being discharged back to [facility]. Patient will be transported via EMS.

## 2021-01-03 NOTE — TOC Transition Note (Signed)
Transition of Care [facility]) - CM/SW Discharge Note   Patient Details  Name: Wendy Nelson MRN: [id number] Date of Birth: February 26, 1966  Transition of Care [facility]) CM/SW Contact:  Allayne Butcher, RN Phone Number: 01/03/2021, 9:59 AM   Clinical Narrative:    Patient medically cleared for discharge back to [facility] today.  Patient will be going to room 205A.  Bedside RN will call report.  Patient updated on discharge today and RNCM called and left a message for the patient's daughter.  Patient reports that her daughter will be coming up to the floor soon to see her.  [facility] will be arranged once COVID test comes back.     Final next level of care: Skilled Nursing Facility Barriers to Discharge: Barriers Resolved   Patient Goals and CMS Choice Patient states their goals for this hospitalization and ongoing recovery are:: Patient will return to [facility] CMS Medicare.gov Compare Post Acute Care list provided to:: Patient Choice offered to / list presented to : Patient  Discharge Placement              Patient chooses bed at: [facility] [place] Patient to be transferred to facility by: [facility] Name of family member notified: Donnella Sham (daughter) Patient and family notified of of transfer: 01/03/21  Discharge Plan and Services   Discharge Planning Services: CM Consult Post Acute Care Choice: Skilled Nursing Facility          DME Arranged: N/A DME Agency: NA       HH Arranged: NA          Social Determinants of Health (SDOH) Interventions     Readmission Risk Interventions No flowsheet data found.

## 2021-01-03 NOTE — Consult Note (Signed)
WOC Nurse Consult Note: Patient receiving care in [facility]. Reason for Consult: wounds on bilateral feet, plantar surfaces. Wound type: No open, active wounds, ONLY healed shallow areas 55+ years old from where the patient had bone spurs removed. NO drainage, no erythema, no induration. HEALED. Pressure Injury POA: Yes/No/NA Measurement: Wound bed: Drainage (amount, consistency, odor)  Periwound: Dressing procedure/placement/frequency: The patient does have dry skin on the feet.  For that I have ordered: Wash feet with soap and water, dry thoroughly. Apply Sween Moisturizing Ointment (pink and white tube in clean utility) to bilateral feet. Do NOT apply between toes. Prevalon heel lift boots are in use bilaterally. Thank you for the consult.  Discussed plan of care with the patient.  WOC nurse will not follow at this time.  Please re-consult the WOC team if needed.  Helmut Muster, RN, MSN, CWOCN, CNS-BC, pager [phone]

## 2021-01-03 NOTE — TOC Progression Note (Signed)
Transition of Care [facility]) - Progression Note    Patient Details  Name: Wendy Nelson MRN: [id number] Date of Birth: 30-May-1966  [facility] CM/SW Contact  Allayne Butcher, RN Phone Number: 01/03/2021, 12:10 PM  Clinical Narrative:    Transport arranged with [facility].  Patient is 2nd on the list for pick up.    Expected Discharge Plan: Skilled Nursing Facility Barriers to Discharge: Barriers Resolved  Expected Discharge Plan and Services Expected Discharge Plan: Skilled Nursing Facility   Discharge Planning Services: CM Consult Post Acute Care Choice: Skilled Nursing Facility Living arrangements for the past 2 months: Skilled Nursing Facility Expected Discharge Date: 01/03/21               DME Arranged: N/A DME Agency: NA       HH Arranged: NA           Social Determinants of Health (SDOH) Interventions    Readmission Risk Interventions No flowsheet data found.

## 2021-01-03 NOTE — Discharge Summary (Addendum)
Physician Discharge Summary  Wendy Nelson ZOX:[id number] DOB: [birth year] DOA: 12/23/2020  PCP: [facility]  Admit date: 12/23/2020 Discharge date: 01/03/2021  Admitted From: snf Discharge disposition: snf   Recommendations for Outpatient Follow-Up:   Levaquin through 6/25 Foley care Please monitor prior IV sites and use warm compresses- will extend abx for 3 more days to cover after IV removed   Discharge Diagnosis:   Active Problems:   Cervical spondylosis with myelopathy and radiculopathy   Spinal cord infarction [facility])   Depression with anxiety   Neurogenic bladder   Sepsis (HCC)    Discharge Condition: Improved.  Diet recommendation: Low sodium, heart healthy.   Wound care: The patient does have dry skin on the feet.  For that I have ordered: Wash feet with soap and water, dry thoroughly. Apply Sween Moisturizing Ointment (pink and white tube in clean utility) to bilateral feet. Do NOT apply between toes. Prevalon heel lift boots are in use bilaterally.  Code status: Full.   History of Present Illness:   Wendy Nelson is a 55 y.o. 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   Patient reports that for the last 3 days she has had a fever.  She reports that she lives at [facility] facility in [place].  Reports that they did many test to try and figure out why she was having fevers but did not find any source.  She currently denies any congestion, runny nose, cough, shortness of breath, chest pain, abdominal pain.  She denies any new bedsores that she is aware of.  She reports that due to a neurogenic bladder from her spinal cord injury she has had a chronic indwelling Foley for some time now.   Per review of documents from nursing facility  patient had temperature as high as 103 Fahrenheit, and has been started on a course of Keflex for presumed urinary tract infection.   In the ED initial vital signs notable for temperature of 100.3 Fahrenheit, tachycardia to 107, and tachypnea to the low 20s, with remainder of vital signs normal.  Lab work-up notable for mild leukocytosis of 11.6 and anemia with hemoglobin of 10.9, remainder of laboratory work-up largely unremarkable including CMP, coags, EKG, and UA.  Chest x-ray was obtained which showed stable mild cardiomegaly but no other findings.  A CT abdomen pelvis was also obtained which showed suggestion of bladder wall thickening indicative of acute cystitis but no other acute findings.  She was presumed to have a urinary source of infection, and given 1 g ceftriaxone as well as a 1 L LR bolus.     Hospital Course by Problem:   Sepsis thought due to catheter associated UTI in patient with chronic indwelling Foley plus FUO -started on Keflex outpatient.  UA with moderate LE and rare bacteria.  Culture data as above.   -Foley replaced on admission. -restarted abx 6/15- plan to de-escalate upon d/c back to SNF -blood cultures -6/16: pan-scan- will start with CT scan chest/abd/pelvis: ? PNA and ? Pyelo-- MRI negative -recheck COVID neative -ID consult appreciated: DC vanco as blood culture neg, MRSA nares neg Continue cefepime in hospital, change to Levaquin on discharge until 01/07/21   BLE diplegia/spinal cord injury/chronic pain -Continue home pain medications-Lyrica, tizanidine  Anxiety/depression: Stable. -Continue home Cymbalta  Neuropathy-continue Lyrica  History  of CVA: -Continue statin and aspirin.   Morbid obesity Body mass index is 42.67 kg/m    Medical Consultants:   ID   Discharge Exam:   Vitals:   01/03/21 0415 01/03/21 0841  BP: 124/80 (!) 145/92  Pulse: 65 71  Resp: 18 16  Temp: 98 F (36.7 C) 98.6 F (37 C)  SpO2: 98% 97%   Vitals:    01/02/21 1955 01/03/21 0012 01/03/21 0415 01/03/21 0841  BP: 104/67 112/74 124/80 (!) 145/92  Pulse: 98 75 65 71  Resp: (!) 21 20 18 16   Temp: 97.8 F (36.6 C) 97.6 F (36.4 C) 98 F (36.7 C) 98.6 F (37 C)  TempSrc:      SpO2: 100% 97% 98% 97%  Weight:      Height:        General exam: Appears calm and comfortable   The results of significant diagnostics from this hospitalization (including imaging, microbiology, ancillary and laboratory) are listed below for reference.     Procedures and Diagnostic Studies:   CT ABDOMEN PELVIS WO CONTRAST  Result Date: 12/23/2020 CLINICAL DATA:  Abdominal pain with fever. Recent urinary tract infection diagnosis. Possible sepsis. EXAM: CT ABDOMEN AND PELVIS WITHOUT CONTRAST TECHNIQUE: Multidetector CT imaging of the abdomen and pelvis was performed following the standard protocol without IV contrast. COMPARISON:  Abdominopelvic CT 10/19/2020. FINDINGS: Lower chest: Stable bibasilar atelectasis or scarring. No significant pleural or pericardial effusion. Hepatobiliary: No focal hepatic abnormalities are identified on noncontrast imaging. No evidence of gallstones, gallbladder wall thickening or biliary dilatation. Pancreas: Unremarkable. No pancreatic ductal dilatation or surrounding inflammatory changes. Spleen: Normal in size without focal abnormality. Adrenals/Urinary Tract: Both adrenal glands appear normal. Both kidneys appear unremarkable as imaged in the noncontrast state. There is no evidence of urinary tract calculus, hydronephrosis or perinephric soft tissue stranding. A Foley catheter is in place. The bladder is decompressed with possible mild wall thickening and surrounding inflammation. Stomach/Bowel: No enteric contrast administered. The stomach appears unremarkable for its degree of distension. No evidence of bowel wall thickening, distention or surrounding inflammatory change. The appendix appears normal. There is stool throughout the colon.  Vascular/Lymphatic: There are no enlarged abdominal or pelvic lymph nodes. Mild aortic and branch vessel atherosclerosis. Reproductive: The uterus and ovaries appear unremarkable. No adnexal mass. Other: No evidence of abdominal wall mass or hernia. No ascites. Musculoskeletal: No acute or significant osseous findings. Moderate facet arthropathy at L4-5 and chronic bilateral sacroiliitis, similar to previous study. IMPRESSION: 1. The urinary bladder is decompressed by a Foley catheter and suboptimally evaluated. There is possible bladder wall thickening and surrounding inflammation which could indicate cystitis. 2. No evidence of urinary tract calculus, hydronephrosis or perinephric inflammation. 3. No evidence of bowel inflammation or obstruction. 4. Chronic sacroiliitis and facet arthropathy in the lower lumbar spine. 5.  Aortic Atherosclerosis (ICD10-I70.0). Electronically Signed   By: 12/19/2020 M.D.   On: 12/23/2020 11:47   DG Chest 2 View  Result Date: 12/23/2020 CLINICAL DATA:  Suspected sepsis.  Recent UTI diagnosis. EXAM: CHEST - 2 VIEW COMPARISON:  Radiographs 08/15/2018. FINDINGS: Suboptimal inspiration on the lateral view. No focal airspace disease is seen on the frontal examination. There is stable mild cardiac enlargement. No pleural effusion or pneumothorax. Previous lower cervical fusion without evidence of acute osseous abnormality. Telemetry leads overlie the chest. IMPRESSION: No evidence of active cardiopulmonary process. Stable mild cardiomegaly. Electronically Signed   By: 08/17/2018 M.D.   On: 12/23/2020 11:40  Labs:   Basic Metabolic Panel: Recent Labs  Lab 12/28/20 0529 12/30/20 0425  NA 132* 137  K 4.4 4.5  CL 94* 106  CO2 28 24  GLUCOSE 96 92  BUN 14 14  CREATININE 0.74 0.60  CALCIUM 9.0 9.1  MG 1.9  --   PHOS 3.6  --    GFR Estimated Creatinine Clearance: 103.6 mL/min (by C-G formula based on SCr of 0.6 mg/dL). Liver Function Tests: Recent Labs   Lab 12/28/20 0529  AST 27  ALT 20  ALKPHOS 100  BILITOT 0.5  PROT 7.0  ALBUMIN 2.9*  2.9*   No results for input(s): LIPASE, AMYLASE in the last 168 hours. No results for input(s): AMMONIA in the last 168 hours. Coagulation profile No results for input(s): INR, PROTIME in the last 168 hours.  CBC: Recent Labs  Lab 12/28/20 0529 12/29/20 0541 12/30/20 0425  WBC 13.2* 12.1* 10.1  NEUTROABS  --  7.0  --   HGB 10.9* 10.8* 10.8*  HCT 32.8* 32.3* 34.1*  MCV 84.5 85.2 89.3  PLT 452* 451* 512*   Cardiac Enzymes: No results for input(s): CKTOTAL, CKMB, CKMBINDEX, TROPONINI in the last 168 hours. BNP: Invalid input(s): POCBNP CBG: No results for input(s): GLUCAP in the last 168 hours. D-Dimer No results for input(s): DDIMER in the last 72 hours. Hgb A1c No results for input(s): HGBA1C in the last 72 hours. Lipid Profile No results for input(s): CHOL, HDL, LDLCALC, TRIG, CHOLHDL, LDLDIRECT in the last 72 hours. Thyroid function studies No results for input(s): TSH, T4TOTAL, T3FREE, THYROIDAB in the last 72 hours.  Invalid input(s): FREET3 Anemia work up No results for input(s): VITAMINB12, FOLATE, FERRITIN, TIBC, IRON, RETICCTPCT in the last 72 hours. Microbiology Recent Results (from the past 240 hour(s))  Respiratory (~20 pathogens) panel by PCR     Status: None   Collection Time: 12/26/20 10:17 AM   Specimen: Nasopharyngeal Swab; Respiratory  Result Value Ref Range Status   Adenovirus NOT DETECTED NOT DETECTED Final   Coronavirus 229E NOT DETECTED NOT DETECTED Final    Comment: (NOTE) The Coronavirus on the Respiratory Panel, DOES NOT test for the novel  Coronavirus (2019 nCoV)    Coronavirus HKU1 NOT DETECTED NOT DETECTED Final   Coronavirus NL63 NOT DETECTED NOT DETECTED Final   Coronavirus OC43 NOT DETECTED NOT DETECTED Final   Metapneumovirus NOT DETECTED NOT DETECTED Final   Rhinovirus / Enterovirus NOT DETECTED NOT DETECTED Final   Influenza A NOT  DETECTED NOT DETECTED Final   Influenza B NOT DETECTED NOT DETECTED Final   Parainfluenza Virus 1 NOT DETECTED NOT DETECTED Final   Parainfluenza Virus 2 NOT DETECTED NOT DETECTED Final   Parainfluenza Virus 3 NOT DETECTED NOT DETECTED Final   Parainfluenza Virus 4 NOT DETECTED NOT DETECTED Final   Respiratory Syncytial Virus NOT DETECTED NOT DETECTED Final   Bordetella pertussis NOT DETECTED NOT DETECTED Final   Bordetella Parapertussis NOT DETECTED NOT DETECTED Final   Chlamydophila pneumoniae NOT DETECTED NOT DETECTED Final   Mycoplasma pneumoniae NOT DETECTED NOT DETECTED Final    Comment: Performed at [facility], [address] [place], [place] [address]  CULTURE, BLOOD (ROUTINE X 2) w Reflex to ID Panel     Status: None   Collection Time: 12/28/20  6:14 PM   Specimen: BLOOD  Result Value Ref Range Status   Specimen Description BLOOD BLOOD LEFT HAND  Final   Special Requests   Final    BOTTLES DRAWN  AEROBIC AND ANAEROBIC Blood Culture adequate volume   Culture   Final    NO GROWTH 5 DAYS Performed at [facility], [address] [place], [place] [address]    Report Status 01/02/2021 FINAL  Final  CULTURE, BLOOD (ROUTINE X 2) w Reflex to ID Panel     Status: None   Collection Time: 12/28/20  6:14 PM   Specimen: BLOOD  Result Value Ref Range Status   Specimen Description BLOOD BLOOD RIGHT HAND  Final   Special Requests   Final    BOTTLES DRAWN AEROBIC AND ANAEROBIC Blood Culture results may not be optimal due to an inadequate volume of blood received in culture bottles   Culture   Final    NO GROWTH 5 DAYS Performed at [facility], [address], [place], [place] [address]    Report Status 01/02/2021 FINAL  Final  SARS CORONAVIRUS 2 (TAT 6-24 HRS) Nasopharyngeal Nasopharyngeal Swab     Status: None   Collection Time: 12/29/20  8:20 AM   Specimen: Nasopharyngeal Swab  Result Value Ref Range Status   SARS Coronavirus 2 NEGATIVE NEGATIVE Final     Comment: (NOTE) SARS-CoV-2 target nucleic acids are NOT DETECTED.  The SARS-CoV-2 RNA is generally detectable in upper and lower respiratory specimens during the acute phase of infection. Negative results do not preclude SARS-CoV-2 infection, do not rule out co-infections with other pathogens, and should not be used as the sole basis for treatment or other patient management decisions. Negative results must be combined with clinical observations, patient history, and epidemiological information. The expected result is Negative.  Fact Sheet for Patients: HairSlick.no  Fact Sheet for Healthcare Providers: quierodirigir.com  This test is not yet approved or cleared by the [place] FDA and  has been authorized for detection and/or diagnosis of SARS-CoV-2 by FDA under an Emergency Use Authorization (EUA). This EUA will remain  in effect (meaning this test can be used) for the duration of the COVID-19 declaration under Se ction 564(b)(1) of the Act, 21 U.S.C. section 360bbb-3(b)(1), unless the authorization is terminated or revoked sooner.  Performed at [facility], [address] [place], [place] [address]   MRSA PCR Screening     Status: None   Collection Time: 12/30/20  5:00 PM  Result Value Ref Range Status   MRSA by PCR NEGATIVE NEGATIVE Final    Comment:        The GeneXpert MRSA Assay (FDA approved for NASAL specimens only), is one component of a comprehensive MRSA colonization surveillance program. It is not intended to diagnose MRSA infection nor to guide or monitor treatment for MRSA infections. Performed at [facility], [address], [place], [place] [address]      Discharge Instructions:   Discharge Instructions     Diet - low sodium heart healthy   Complete by: As directed    Increase activity slowly   Complete by: As directed       Allergies as of 01/03/2021        Reactions   Melatonin    Neomycin Other (See Comments)   Positive patch test   Sulfa Antibiotics         Medication List     STOP taking these medications    buprenorphine 2 MG Subl SL tablet Commonly known as: SUBUTEX   fluorometholone 0.1 % ophthalmic suspension Commonly known as: FML   meloxicam 7.5 MG tablet Commonly known as: MOBIC   midodrine  10 MG tablet Commonly known as: PROAMATINE   oxyCODONE-acetaminophen 5-325 MG tablet Commonly known as: PERCOCET/ROXICET   traZODone 50 MG tablet Commonly known as: DESYREL       TAKE these medications    acamprosate 333 MG tablet Commonly known as: CAMPRAL Take 333 mg by mouth 3 (three) times daily.   Acetaminophen 500 MG capsule Take 1,000 mg by mouth every 6 (six) hours as needed.   aspirin 325 MG tablet Take 325 mg by mouth daily.   atorvastatin 10 MG tablet Commonly known as: LIPITOR Take 10 mg by mouth daily.   bisacodyl 10 MG suppository Commonly known as: DULCOLAX Place 1 suppository (10 mg total) rectally daily as needed for moderate constipation.   dicyclomine 10 MG capsule Commonly known as: BENTYL Take 10 mg by mouth 3 (three) times daily with meals.   DULoxetine 30 MG capsule Commonly known as: CYMBALTA Take 60 mg by mouth daily.   levofloxacin 750 MG tablet Commonly known as: Levaquin Take 1 tablet (750 mg total) by mouth daily for 2 days.   lidocaine 5 % Commonly known as: LIDODERM Place 1 patch onto the skin daily. Remove & Discard patch within 12 hours or as directed by MD   methocarbamol 500 MG tablet Commonly known as: ROBAXIN Take 1 tablet (500 mg total) by mouth every 6 (six) hours as needed for muscle spasms.   MiraLax 17 g packet Generic drug: polyethylene glycol Take 17 g by mouth daily.   multivitamin tablet Take 1 tablet by mouth daily.   nystatin powder Commonly known as: MYCOSTATIN/NYSTOP Apply topically 2 (two) times daily.   omeprazole 40 MG  capsule Commonly known as: PRILOSEC Take 1 capsule by mouth 2 (two) times daily. What changed: Another medication with the same name was removed. Continue taking this medication, and follow the directions you see here.   pregabalin 100 MG capsule Commonly known as: LYRICA Take 1 capsule by mouth 2 (two) times daily.   senna-docusate 8.6-50 MG tablet Commonly known as: Senokot-S Take 1 tablet by mouth daily.   tiZANidine 2 MG tablet Commonly known as: ZANAFLEX Take 2 mg by mouth 2 (two) times daily.   vitamin C 500 MG tablet Commonly known as: ASCORBIC ACID Take 500 mg by mouth 2 (two) times daily.        Contact information for after-discharge care     Destination     [facility] Preferred SNF. Go to.   Service: Skilled Nursing Contact information: [address] [place] [address] [phone]                      Time coordinating discharge: 35 min  Signed:  Joseph Art DO  [facility] 01/03/2021, 9:03 AM

## 2021-01-23 ENCOUNTER — Ambulatory Visit: Payer: Medicare Other | Admitting: Physician Assistant

## 2021-01-27 ENCOUNTER — Encounter: Payer: Self-pay | Admitting: Physician Assistant

## 2021-02-22 ENCOUNTER — Other Ambulatory Visit: Payer: Self-pay

## 2021-02-22 ENCOUNTER — Emergency Department
Admission: EM | Admit: 2021-02-22 | Discharge: 2021-02-22 | Disposition: A | Discharge status: Skilled Nursing Facility | Payer: Medicare Other | Attending: Emergency Medicine | Admitting: Emergency Medicine

## 2021-02-22 ENCOUNTER — Emergency Department: Payer: Medicare Other

## 2021-02-22 DIAGNOSIS — I10 Essential (primary) hypertension: Secondary | ICD-10-CM | POA: Diagnosis not present

## 2021-02-22 DIAGNOSIS — M79601 Pain in right arm: Secondary | ICD-10-CM | POA: Diagnosis not present

## 2021-02-22 DIAGNOSIS — Z79899 Other long term (current) drug therapy: Secondary | ICD-10-CM | POA: Insufficient documentation

## 2021-02-22 DIAGNOSIS — Z7982 Long term (current) use of aspirin: Secondary | ICD-10-CM | POA: Diagnosis not present

## 2021-02-22 DIAGNOSIS — I82621 Acute embolism and thrombosis of deep veins of right upper extremity: Secondary | ICD-10-CM | POA: Diagnosis not present

## 2021-02-22 LAB — CBC WITH DIFFERENTIAL/PLATELET
Abs Immature Granulocytes: 0.02 10*3/uL (ref 0.00–0.07)
Basophils Absolute: 0.1 10*3/uL (ref 0.0–0.1)
Basophils Relative: 1 %
Eosinophils Absolute: 0.3 10*3/uL (ref 0.0–0.5)
Eosinophils Relative: 5 %
HCT: 39.3 % (ref 36.0–46.0)
Hemoglobin: 13.1 g/dL (ref 12.0–15.0)
Immature Granulocytes: 0 %
Lymphocytes Relative: 41 %
Lymphs Abs: 2.9 10*3/uL (ref 0.7–4.0)
MCH: 28.6 pg (ref 26.0–34.0)
MCHC: 33.3 g/dL (ref 30.0–36.0)
MCV: 85.8 fL (ref 80.0–100.0)
Monocytes Absolute: 0.5 10*3/uL (ref 0.1–1.0)
Monocytes Relative: 7 %
Neutro Abs: 3.2 10*3/uL (ref 1.7–7.7)
Neutrophils Relative %: 46 %
Platelets: 298 10*3/uL (ref 150–400)
RBC: 4.58 MIL/uL (ref 3.87–5.11)
RDW: 15.4 % (ref 11.5–15.5)
WBC: 7 10*3/uL (ref 4.0–10.5)
nRBC: 0 % (ref 0.0–0.2)

## 2021-02-22 LAB — BASIC METABOLIC PANEL
Anion gap: 9 (ref 5–15)
BUN: 17 mg/dL (ref 6–20)
CO2: 29 mmol/L (ref 22–32)
Calcium: 8.9 mg/dL (ref 8.9–10.3)
Chloride: 104 mmol/L (ref 98–111)
Creatinine, Ser: 0.5 mg/dL (ref 0.44–1.00)
GFR, Estimated: >60 mL/min (ref >60)
Glucose, Bld: 73 mg/dL (ref 70–99)
Potassium: 3.9 mmol/L (ref 3.5–5.1)
Sodium: 142 mmol/L (ref 135–145)

## 2021-02-22 LAB — PROTIME-INR
INR: 1 (ref 0.8–1.2)
Prothrombin Time: 13.3 seconds (ref 11.4–15.2)

## 2021-02-22 NOTE — ED Triage Notes (Signed)
Pt reports swelling in RUE x 2 days. Pt reports at [facility] she had a DVT study done this morning and was told it was positive and she needed to come to the ED

## 2021-02-22 NOTE — ED Provider Notes (Signed)
[facility] Emergency Department Provider Note  ____________________________________________   Event Date/Time   First MD Initiated Contact with Patient 02/22/21 1409     (approximate)  I have reviewed the triage vital signs and the nursing notes.   HISTORY  Chief Complaint DVT  (Positive DVT study this am)    HPI Wendy Nelson is a 55 y.o. female   with past medical history of hypertension, hyperlipidemia, depression, spinal cord infarction with subsequent paraplegia here with DVT.  Patient reportedly was sent in after ultrasound showed DVT of the right brachial vein.  She does not arrive with records of this.  She reports that she can move her right arm but not very well, so she is essentially immobile there.  She has diminished sensation so does not know if it had been painful or not.  She denies any chest pain or shortness of breath.  Denies any headaches.  Denies known history of DVT despite her multiple prior admissions and immobility.  She is bedbound.  No other complaints.  Has medication changes.       Past Medical History:  Diagnosis Date   Stroke [facility]) 09/2020    Patient Active Problem List   Diagnosis Date Noted   Sepsis (HCC) 12/23/2020   Neurogenic bladder    Syncope 10/19/2020   AKI (acute kidney injury) (HCC) 10/19/2020   Bilateral hydronephrosis 10/19/2020   HTN (hypertension) 10/19/2020   HLD (hyperlipidemia) 10/19/2020   Depression with anxiety 10/19/2020   Hypotension 09/2020   Cervical spondylosis with myelopathy and radiculopathy 09/2020   Spinal cord infarction [facility]) 09/2020    Past Surgical History:  Procedure Laterality Date   CERVICAL SPINE SURGERY     JOINT REPLACEMENT Right    Knee    Prior to Admission medications   Medication Sig Start Date End Date Taking? Authorizing Provider  acamprosate (CAMPRAL) 333 MG tablet Take 333 mg by mouth 3 (three) times daily. 09/05/20   [provider]  Acetaminophen 500  MG capsule Take 1,000 mg by mouth every 6 (six) hours as needed. 10/12/20   [provider]  aspirin 325 MG tablet Take 325 mg by mouth daily. 09/24/20 09/24/21  [provider]  atorvastatin (LIPITOR) 10 MG tablet Take 10 mg by mouth daily.    [provider]  bisacodyl (DULCOLAX) 10 MG suppository Place 1 suppository (10 mg total) rectally daily as needed for moderate constipation. 01/02/21   Joseph Art, DO  dicyclomine (BENTYL) 10 MG capsule Take 10 mg by mouth 3 (three) times daily with meals. 08/04/20 08/04/21  [provider]  DULoxetine (CYMBALTA) 30 MG capsule Take 60 mg by mouth daily. 10/12/20   [provider]  lidocaine (LIDODERM) 5 % Place 1 patch onto the skin daily. Remove & Discard patch within 12 hours or as directed by MD 10/21/20   Enedina Finner, MD  methocarbamol (ROBAXIN) 500 MG tablet Take 1 tablet (500 mg total) by mouth every 6 (six) hours as needed for muscle spasms. 01/02/21   Joseph Art, DO  Multiple Vitamin (MULTIVITAMIN) tablet Take 1 tablet by mouth daily.    [provider]  nystatin (MYCOSTATIN/NYSTOP) powder Apply topically 2 (two) times daily. 01/02/21   Joseph Art, DO  omeprazole (PRILOSEC) 40 MG capsule Take 1 capsule by mouth 2 (two) times daily. 12/11/20   [provider]  polyethylene glycol (MIRALAX) 17 g packet Take 17 g by mouth daily.    [provider]  pregabalin (LYRICA) 100 MG capsule Take 1 capsule by mouth 2 (two) times daily. 10/12/20   [provider]  senna-docusate (SENOKOT-S) 8.6-50 MG tablet Take 1 tablet by mouth daily.    [provider]  tiZANidine (ZANAFLEX) 2 MG tablet Take 2 mg by mouth 2 (two) times daily. 09/14/20   [provider]  vitamin C (ASCORBIC ACID) 500 MG tablet Take 500 mg by mouth 2 (two) times daily.    [provider]  hydrochlorothiazide (HYDRODIURIL) 25 MG tablet Take 1 tablet by mouth daily. Patient not taking:  Reported on 12/23/2020 12/14/16 12/24/20  [provider]    Allergies Melatonin, Neomycin, and Sulfa antibiotics  Family History  Problem Relation Age of Onset   Diabetes Mellitus II Mother    Hypertension Mother    Diabetes Mellitus II Brother     Social History Social History   Tobacco Use   Smoking status: Never   Smokeless tobacco: Never    Review of Systems  Review of Systems  Constitutional:  Negative for chills and fever.  HENT:  Negative for sore throat.   Respiratory:  Negative for shortness of breath.   Cardiovascular:  Negative for chest pain.  Gastrointestinal:  Negative for abdominal pain.  Genitourinary:  Negative for flank pain.  Musculoskeletal:  Negative for neck pain.  Skin:  Negative for rash and wound.  Allergic/Immunologic: Negative for immunocompromised state.  Neurological:  Negative for weakness and numbness.  Hematological:  Does not bruise/bleed easily.    ____________________________________________  PHYSICAL EXAM:      VITAL SIGNS: ED Triage Vitals  Enc Vitals Group     BP 02/22/21 1401 91/69     Pulse Rate 02/22/21 1401 83     Resp 02/22/21 1401 16     Temp 02/22/21 1401 98.2 F (36.8 C)     Temp Source 02/22/21 1401 Oral     SpO2 02/22/21 1401 100 %     Weight 02/22/21 1402 257 lb 14.4 oz (117 kg)     Height 02/22/21 1402 5\' 5"  (1.651 m)     Head Circumference --      Peak Flow --      Pain Score 02/22/21 1402 0     Pain Loc --      Pain Edu? --      Excl. in GC? --      Physical Exam Vitals and nursing note reviewed.  Constitutional:      General: She is not in acute distress.    Appearance: She is well-developed.  HENT:     Head: Normocephalic and atraumatic.  Eyes:     Conjunctiva/sclera: Conjunctivae normal.  Cardiovascular:     Rate and Rhythm: Normal rate and regular rhythm.     Heart sounds: Normal heart sounds.     Comments: 2+ Radial pulse RUE, normal cap refill Pulmonary:     Effort: Pulmonary  effort is normal. No respiratory distress.     Breath sounds: No wheezing.  Abdominal:     General: There is no distension.  Musculoskeletal:     Cervical back: Neck supple.     Comments: No significant edema, erythema of RUE.   Skin:    General: Skin is warm.     Capillary Refill: Capillary refill takes less than 2 seconds.     Findings: No rash.  Neurological:     Mental Status: She is alert.     Motor: No abnormal muscle tone.  Comments: Strength 4/5 RUE, paraplegic bl LE, 5/5 LUE. Diminished sensation throughout R arm, bl LE. CN intact.      ____________________________________________   LABS (all labs ordered are listed, but only abnormal results are displayed)  Labs Reviewed  CBC WITH DIFFERENTIAL/PLATELET  BASIC METABOLIC PANEL  CBC WITH DIFFERENTIAL/PLATELET  PROTIME-INR    ____________________________________________  EKG:  ________________________________________  RADIOLOGY All imaging, including plain films, CT scans, and ultrasounds, independently reviewed by me, and interpretations confirmed via formal radiology reads.  ED MD interpretation:   [place]: Pending  Official radiology report(s): No results found.  ____________________________________________  PROCEDURES   Procedure(s) performed (including Critical Care):  Procedures  ____________________________________________  INITIAL IMPRESSION / MDM / ASSESSMENT AND PLAN / ED COURSE  As part of my medical decision making, I reviewed the following data within the electronic MEDICAL RECORD NUMBER Nursing notes reviewed and incorporated, Old chart reviewed, Notes from prior ED visits, and [place] Controlled Substance Database       *Anelis Hrivnak was evaluated in Emergency Department on 02/22/2021 for the symptoms described in the history of present illness. She was evaluated in the context of the global COVID-19 pandemic, which necessitated consideration that the patient might be at risk for infection with the  SARS-CoV-2 virus that causes COVID-19. Institutional protocols and algorithms that pertain to the evaluation of patients at risk for COVID-19 are in a state of rapid change based on information released by regulatory bodies including the CDC and federal and state organizations. These policies and algorithms were followed during the patient's care in the ED.  Some ED evaluations and interventions may be delayed as a result of limited staffing during the pandemic.*     Medical Decision Making: 55 year old female with history of paraplegia and subsequent immobility here with right upper extremity DVT.  Patient arrives after ultrasound was reportedly positive from her facility.  She has diminished sensation and movement of the arm so it is somewhat difficult to assess the degree of her symptoms.  The arm itself does not appear overtly edematous, erythematous, with no evidence of phlegmasia.  Radial pulses are intact and cap refill is normal.  Will obtain an ultrasound here as well as screening lab work to assess for hemoglobin, platelets, coagulation panel.  Suspect patient could be treated with oral anticoagulants if vascular is amenable to this.  No signs of PE clinically with no tachycardia, tachypnea, hypoxia, or complaint of chest pain or shortness of breath.  ____________________________________________  FINAL CLINICAL IMPRESSION(S) / ED DIAGNOSES  Final diagnoses:  Right arm pain     MEDICATIONS GIVEN DURING THIS VISIT:  Medications - No data to display   ED Discharge Orders     None        Note:  This document was prepared using Dragon voice recognition software and may include unintentional dictation errors.   Shaune Pollack, MD 02/22/21 [phone]

## 2021-02-22 NOTE — Discharge Instructions (Addendum)
We performed an ultrasound of Wendy Nelson's right arm and there was no evidence of a DVT or any issues there.  Furthermore, her blood work was unremarkable.

## 2021-02-22 NOTE — ED Provider Notes (Signed)
  Patient received in signout from Dr. Erma Heritage pending DVT ultrasound to the RUE after reports of positive study at her SNF for DVT to her brachial vein on the right.  The study was reviewed as negative, and I reviewed these images as well and she has good compressibility throughout all images.  No evidence of DVT.  Furthermore, her blood work was unremarkable and coagulation panel is normal.  Remains with normal vital signs.  We will discharge back to SNF with return precautions.      Results for orders placed or performed during the hospital encounter of 02/22/21 (from the past 48 hour(s))  Basic metabolic panel     Status: None   Collection Time: 02/22/21  3:30 PM  Result Value Ref Range   Sodium 142 135 - 145 mmol/L   Potassium 3.9 3.5 - 5.1 mmol/L   Chloride 104 98 - 111 mmol/L   CO2 29 22 - 32 mmol/L   Glucose, Bld 73 70 - 99 mg/dL    Comment: Glucose reference range applies only to samples taken after fasting for at least 8 hours.   BUN 17 6 - 20 mg/dL   Creatinine, Ser 8.12 0.44 - 1.00 mg/dL   Calcium 8.9 8.9 - 75.1 mg/dL   GFR, Estimated >70 >01 mL/min    Comment: (NOTE) Calculated using the CKD-EPI Creatinine Equation (2021)    Anion gap 9 5 - 15    Comment: Performed at [facility], [address] [place], [place] [address]  CBC with Differential/Platelet     Status: None   Collection Time: 02/22/21  3:30 PM  Result Value Ref Range   WBC 7.0 4.0 - 10.5 K/uL   RBC 4.58 3.87 - 5.11 MIL/uL   Hemoglobin 13.1 12.0 - 15.0 g/dL   HCT 96.7 59.1 - 63.8 %   MCV 85.8 80.0 - 100.0 fL   MCH 28.6 26.0 - 34.0 pg   MCHC 33.3 30.0 - 36.0 g/dL   RDW 46.6 59.9 - 35.7 %   Platelets 298 150 - 400 K/uL   nRBC 0.0 0.0 - 0.2 %   Neutrophils Relative % 46 %   Neutro Abs 3.2 1.7 - 7.7 K/uL   Lymphocytes Relative 41 %   Lymphs Abs 2.9 0.7 - 4.0 K/uL   Monocytes Relative 7 %   Monocytes Absolute 0.5 0.1 - 1.0 K/uL   Eosinophils Relative 5 %   Eosinophils Absolute 0.3 0.0 - 0.5  K/uL   Basophils Relative 1 %   Basophils Absolute 0.1 0.0 - 0.1 K/uL   Immature Granulocytes 0 %   Abs Immature Granulocytes 0.02 0.00 - 0.07 K/uL    Comment: Performed at [facility], [address] [place], [place] [address]  Protime-INR     Status: None   Collection Time: 02/22/21  3:30 PM  Result Value Ref Range   Prothrombin Time 13.3 11.4 - 15.2 seconds   INR 1.0 0.8 - 1.2    Comment: (NOTE) INR goal varies based on device and disease states. Performed at [facility], [address], [place], [place] [address]       Delton Prairie, MD 02/22/21 [phone]

## 2021-02-22 NOTE — ED Notes (Addendum)
RN attempted IV access to the left forearm with a 20g IV. Unable to obtain IV access, catheter intact, bleeding controlled. 3cc of blood obtained and PT-INR was sent. Primary RN notified

## 2021-02-22 NOTE — ED Triage Notes (Signed)
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

## 2021-03-13 NOTE — Progress Notes (Signed)
03/14/2021 9:55 AM   Wendy Nelson 05-22-1966 [id number]  Referring provider: Center, [facility] [address] [place],  [place] [address]  Urological history: 1. Neurogenic bladder -09/2020-cervical spondylosis with myelopathy and radiculopathy s/p C5-7 posterior cervical laminectomy and fusion with right sided foraminotomies at [facility] 1 month ago who developed postoperative cervical spinal cord infarct  -managed with an indwelling Foley  2. Bilateral hydronephrosis -secondary to urinary retention -resolved with indwelling Foley  3. Pyelonephrosis -seen on contrast CT 12/2020 -hospitalized 12/2020  Chief Complaint  Patient presents with   Other    HPI: Wendy Nelson is a 55 y.o. female who presents today for blood clots in her Foley bag with her daughter, Donnella Sham.    She had noted blood in her catheter tubing and dark colored urine in the bag with clots last week.  It lasted for two days.  She was being treated for an UTI at that time.   The Foley was changed two weeks ago.    Currently, Patient denies any modifying or aggravating factors.  Patient denies any gross hematuria, dysuria or suprapubic/flank pain.  Patient denies any fevers, chills, nausea or vomiting.    PMH: Past Medical History:  Diagnosis Date   Stroke [facility]) 09/2020    Surgical History: Past Surgical History:  Procedure Laterality Date   CERVICAL SPINE SURGERY     JOINT REPLACEMENT Right    Knee    Home Medications:  Allergies as of 03/14/2021       Reactions   Melatonin    Neomycin Other (See Comments)   Positive patch test   Sulfa Antibiotics         Medication List        Accurate as of March 14, 2021  9:55 AM. If you have any questions, ask your nurse or doctor.          acamprosate 333 MG tablet Commonly known as: CAMPRAL Take 333 mg by mouth 3 (three) times daily.   Acetaminophen 500 MG capsule Take 1,000 mg by mouth every 6 (six) hours as needed.   aspirin  325 MG tablet Take 325 mg by mouth daily.   atorvastatin 10 MG tablet Commonly known as: LIPITOR Take 10 mg by mouth daily.   bisacodyl 10 MG suppository Commonly known as: DULCOLAX Place 1 suppository (10 mg total) rectally daily as needed for moderate constipation.   dicyclomine 10 MG capsule Commonly known as: BENTYL Take 10 mg by mouth 3 (three) times daily with meals.   DULoxetine 30 MG capsule Commonly known as: CYMBALTA Take 60 mg by mouth daily.   lidocaine 5 % Commonly known as: LIDODERM Place 1 patch onto the skin daily. Remove & Discard patch within 12 hours or as directed by MD   methocarbamol 500 MG tablet Commonly known as: ROBAXIN Take 1 tablet (500 mg total) by mouth every 6 (six) hours as needed for muscle spasms.   multivitamin tablet Take 1 tablet by mouth daily.   nystatin powder Commonly known as: MYCOSTATIN/NYSTOP Apply topically 2 (two) times daily.   omeprazole 40 MG capsule Commonly known as: PRILOSEC Take 1 capsule by mouth 2 (two) times daily.   polyethylene glycol 17 g packet Commonly known as: MIRALAX / GLYCOLAX Take 17 g by mouth daily.   pregabalin 100 MG capsule Commonly known as: LYRICA Take 1 capsule by mouth 2 (two) times daily.   senna-docusate 8.6-50 MG tablet Commonly known as: Senokot-S Take 1 tablet by mouth  daily.   tiZANidine 2 MG tablet Commonly known as: ZANAFLEX Take 2 mg by mouth 2 (two) times daily.   vitamin C 500 MG tablet Commonly known as: ASCORBIC ACID Take 500 mg by mouth 2 (two) times daily.        Allergies:  Allergies  Allergen Reactions   Melatonin    Neomycin Other (See Comments)    Positive patch test   Sulfa Antibiotics     Family History: Family History  Problem Relation Age of Onset   Diabetes Mellitus II Mother    Hypertension Mother    Diabetes Mellitus II Brother     Social History:  reports that she has never smoked. She has never used smokeless tobacco. No history on file  for alcohol use and drug use.  ROS: Pertinent ROS in HPI  Physical Exam: BP 128/76   Pulse 86   Ht 5\' 5"  (1.651 m)   BMI 42.92 kg/m   Constitutional:  Well nourished. Alert and oriented, No acute distress. HEENT: [place] AT, mask in place.  Trachea midline Cardiovascular: No clubbing, cyanosis, or edema. Respiratory: Normal respiratory effort, no increased work of breathing.  spleen not palpable.  No hernias appreciated.  Stool sample for occult testing is not indicated.   GU: No CVA tenderness.  No bladder fullness or masses.  Foley in place draining clear yellow urine Neurologic: Grossly intact, no focal deficits, moving all 4 extremities. Psychiatric: Normal mood and affect.    Laboratory Data: Lab Results  Component Value Date   WBC 7.0 02/22/2021   HGB 13.1 02/22/2021   HCT 39.3 02/22/2021   MCV 85.8 02/22/2021   PLT 298 02/22/2021    Lab Results  Component Value Date   CREATININE 0.50 02/22/2021    Lab Results  Component Value Date   AST 27 12/28/2020   Lab Results  Component Value Date   ALT 20 12/28/2020  I have reviewed the labs.   Pertinent Imaging: CLINICAL DATA:  Abdominal pain with fever. Recent urinary tract infection diagnosis. Possible sepsis.   EXAM: CT ABDOMEN AND PELVIS WITHOUT CONTRAST   TECHNIQUE: Multidetector CT imaging of the abdomen and pelvis was performed following the standard protocol without IV contrast.   COMPARISON:  Abdominopelvic CT 10/19/2020.   FINDINGS: Lower chest: Stable bibasilar atelectasis or scarring. No significant pleural or pericardial effusion.   Hepatobiliary: No focal hepatic abnormalities are identified on noncontrast imaging. No evidence of gallstones, gallbladder wall thickening or biliary dilatation.   Pancreas: Unremarkable. No pancreatic ductal dilatation or surrounding inflammatory changes.   Spleen: Normal in size without focal abnormality.   Adrenals/Urinary Tract: Both adrenal glands appear  normal. Both kidneys appear unremarkable as imaged in the noncontrast state. There is no evidence of urinary tract calculus, hydronephrosis or perinephric soft tissue stranding. A Foley catheter is in place. The bladder is decompressed with possible mild wall thickening and surrounding inflammation.   Stomach/Bowel: No enteric contrast administered. The stomach appears unremarkable for its degree of distension. No evidence of bowel wall thickening, distention or surrounding inflammatory change. The appendix appears normal. There is stool throughout the colon.   Vascular/Lymphatic: There are no enlarged abdominal or pelvic lymph nodes. Mild aortic and branch vessel atherosclerosis.   Reproductive: The uterus and ovaries appear unremarkable. No adnexal mass.   Other: No evidence of abdominal wall mass or hernia. No ascites.   Musculoskeletal: No acute or significant osseous findings. Moderate facet arthropathy at L4-5 and chronic bilateral sacroiliitis, similar to previous  study.   IMPRESSION: 1. The urinary bladder is decompressed by a Foley catheter and suboptimally evaluated. There is possible bladder wall thickening and surrounding inflammation which could indicate cystitis. 2. No evidence of urinary tract calculus, hydronephrosis or perinephric inflammation. 3. No evidence of bowel inflammation or obstruction. 4. Chronic sacroiliitis and facet arthropathy in the lower lumbar spine. 5.  Aortic Atherosclerosis (ICD10-I70.0).     Electronically Signed   By: Carey BullocksWilliam  Veazey M.D.   On: 12/23/2020 11:47 CLINICAL DATA:  Sepsis and fever of unknown origin.   EXAM: CT ANGIOGRAPHY CHEST   CT ABDOMEN AND PELVIS WITH CONTRAST   TECHNIQUE: Multidetector CT imaging of the chest was performed using the standard protocol during bolus administration of intravenous contrast. Multiplanar CT image reconstructions and MIPs were obtained to evaluate the vascular anatomy. Multidetector  CT imaging of the abdomen and pelvis was performed using the standard protocol during bolus administration of intravenous contrast.   CONTRAST:  75mL OMNIPAQUE IOHEXOL 350 MG/ML SOLN   COMPARISON:  Abdomen/pelvis CT 12/23/2020   FINDINGS: CTA CHEST FINDINGS   Cardiovascular: Heart is enlarged. Atherosclerotic calcification is noted in the wall of the thoracic aorta. There is no filling defect within the opacified pulmonary arteries to suggest the presence of an acute pulmonary embolus.   Mediastinum/Nodes: No mediastinal lymphadenopathy. There is no hilar lymphadenopathy. The esophagus has normal imaging features. There is no axillary lymphadenopathy.   Lungs/Pleura: Subsegmental atelectasis noted dependent lung bases. Superimposed pneumonia cannot be excluded in the left base (image 65/6). There is no evidence of pleural effusion.   Musculoskeletal: No worrisome lytic or sclerotic osseous abnormality.   Review of the MIP images confirms the above findings.   CT ABDOMEN and PELVIS FINDINGS   Hepatobiliary: No suspicious focal abnormality within the liver parenchyma. There is no evidence for gallstones, gallbladder wall thickening, or pericholecystic fluid. No intrahepatic or extrahepatic biliary dilation.   Pancreas: No focal mass lesion. No dilatation of the main duct. No intraparenchymal cyst. No peripancreatic edema.   Spleen: No splenomegaly. No focal mass lesion.   Adrenals/Urinary Tract: No adrenal nodule or mass. Areas of segmental edema/hypoperfusion noted upper pole right kidney (best seen on delayed images 9 and 10 of series 7. There is also wedge-shaped segmental edema anterior upper left kidney on 12/07 and upper interpolar left kidney on 16/7. No evidence for hydroureter. Bladder decompressed by Foley catheter with gas in the bladder presumably secondary to instrumentation.   Stomach/Bowel: Tiny hiatal hernia. Stomach decompressed. Duodenum is normally  positioned as is the ligament of Treitz. No small bowel wall thickening. No small bowel dilatation. The terminal ileum is normal. The appendix is normal. No gross colonic mass. No colonic wall thickening.   Vascular/Lymphatic: There is abdominal aortic atherosclerosis without aneurysm. There is no gastrohepatic or hepatoduodenal ligament lymphadenopathy. No retroperitoneal or mesenteric lymphadenopathy. No pelvic sidewall lymphadenopathy.   Reproductive: The uterus is unremarkable.  There is no adnexal mass.   Other: No intraperitoneal free fluid.   Musculoskeletal: Scattered foci of gas within the anterior subcutaneous fat of the abdominal wall likely related to injection sites. No worrisome lytic or sclerotic osseous abnormality. Stable changes of sacroiliitis.   IMPRESSION: 1. No CT evidence for acute pulmonary embolus. 2. Subsegmental atelectasis in the dependent lung bases. Superimposed pneumonia cannot be excluded in the left base. 3. Bilateral wedge-shaped areas of segmental edema/hypoperfusion in the kidneys, right greater than left. Imaging features compatible with pyelonephritis. 4. Tiny hiatal hernia. 5. Aortic Atherosclerosis (ICD10-I70.0).  Electronically Signed   By: Kennith Center M.D.   On: 12/29/2020 16:03  I have independently reviewed the films.  See HPI.   Assessment & Plan:    1. Gross hematuria -Discuss causes of blood in the urine, such as UTI, stones and/or cancer -She has had a renal ultrasound, renal stone study, a noncontrast CT and a contrast CT within the last 5 months - no no malignancies noted -Discussed undergoing a cystoscopy at this time vs waiting until gross heme occurs again as a gross heme did clear with antibiotics -They have decided to defer at this time unless the gross heme reoccurs  2. Neurogenic bladder -refer back for UDS at [facility] -RUS and cysto in 09/2021 with Dr. Apolinar Junes   Return for Cysto and RUS in 09/2021  wtih Dr. Apolinar Junes - indwelling foley .  These notes generated with voice recognition software. I apologize for typographical errors.  Michiel Cowboy, PA-C  [facility] [address]  [address] [place], [place] [address] [phone]

## 2021-03-14 ENCOUNTER — Encounter: Payer: Self-pay | Admitting: Urology

## 2021-03-14 ENCOUNTER — Other Ambulatory Visit: Payer: Self-pay

## 2021-03-14 ENCOUNTER — Ambulatory Visit (INDEPENDENT_AMBULATORY_CARE_PROVIDER_SITE_OTHER): Payer: Medicare Other | Admitting: Urology

## 2021-03-14 VITALS — BP 128/76 | HR 86 | Ht 65.0 in

## 2021-03-14 DIAGNOSIS — N319 Neuromuscular dysfunction of bladder, unspecified: Secondary | ICD-10-CM

## 2021-03-14 DIAGNOSIS — N390 Urinary tract infection, site not specified: Secondary | ICD-10-CM | POA: Diagnosis not present

## 2021-03-14 DIAGNOSIS — R310 Gross hematuria: Secondary | ICD-10-CM | POA: Diagnosis not present

## 2021-04-12 ENCOUNTER — Emergency Department: Payer: Medicare Other

## 2021-04-12 ENCOUNTER — Emergency Department
Admission: EM | Admit: 2021-04-12 | Discharge: 2021-04-13 | Disposition: A | Discharge status: Home / Self Care | Payer: Medicare Other | Attending: Emergency Medicine | Admitting: Emergency Medicine

## 2021-04-12 DIAGNOSIS — Z7982 Long term (current) use of aspirin: Secondary | ICD-10-CM | POA: Diagnosis not present

## 2021-04-12 DIAGNOSIS — Z96651 Presence of right artificial knee joint: Secondary | ICD-10-CM | POA: Diagnosis not present

## 2021-04-12 DIAGNOSIS — Z79899 Other long term (current) drug therapy: Secondary | ICD-10-CM | POA: Insufficient documentation

## 2021-04-12 DIAGNOSIS — I10 Essential (primary) hypertension: Secondary | ICD-10-CM | POA: Insufficient documentation

## 2021-04-12 DIAGNOSIS — H02402 Unspecified ptosis of left eyelid: Secondary | ICD-10-CM

## 2021-04-12 DIAGNOSIS — R791 Abnormal coagulation profile: Secondary | ICD-10-CM | POA: Diagnosis not present

## 2021-04-12 DIAGNOSIS — H538 Other visual disturbances: Secondary | ICD-10-CM | POA: Diagnosis present

## 2021-04-12 LAB — CBC
HCT: 35.9 % — ABNORMAL LOW (ref 36.0–46.0)
Hemoglobin: 12 g/dL (ref 12.0–15.0)
MCH: 29.1 pg (ref 26.0–34.0)
MCHC: 33.4 g/dL (ref 30.0–36.0)
MCV: 87.1 fL (ref 80.0–100.0)
Platelets: 337 10*3/uL (ref 150–400)
RBC: 4.12 MIL/uL (ref 3.87–5.11)
RDW: 15.4 % (ref 11.5–15.5)
WBC: 8 10*3/uL (ref 4.0–10.5)
nRBC: 0 % (ref 0.0–0.2)

## 2021-04-12 LAB — URINALYSIS, COMPLETE (UACMP) WITH MICROSCOPIC
Bilirubin Urine: NEGATIVE
Glucose, UA: NEGATIVE mg/dL
Ketones, ur: NEGATIVE mg/dL
Nitrite: NEGATIVE
Specific Gravity, Urine: 1.025 (ref 1.005–1.030)
pH: 6.5 (ref 5.0–8.0)

## 2021-04-12 LAB — COMPREHENSIVE METABOLIC PANEL
ALT: 14 U/L (ref 0–44)
AST: 12 U/L — ABNORMAL LOW (ref 15–41)
Albumin: 3.2 g/dL — ABNORMAL LOW (ref 3.5–5.0)
Alkaline Phosphatase: 123 U/L (ref 38–126)
Anion gap: 9 (ref 5–15)
BUN: 19 mg/dL (ref 6–20)
CO2: 30 mmol/L (ref 22–32)
Calcium: 8.9 mg/dL (ref 8.9–10.3)
Chloride: 101 mmol/L (ref 98–111)
Creatinine, Ser: 0.62 mg/dL (ref 0.44–1.00)
GFR, Estimated: >60 mL/min (ref >60)
Glucose, Bld: 82 mg/dL (ref 70–99)
Potassium: 4.1 mmol/L (ref 3.5–5.1)
Sodium: 140 mmol/L (ref 135–145)
Total Bilirubin: 0.7 mg/dL (ref 0.3–1.2)
Total Protein: 6.8 g/dL (ref 6.5–8.1)

## 2021-04-12 LAB — APTT: aPTT: 28 seconds (ref 24–36)

## 2021-04-12 LAB — TROPONIN I (HIGH SENSITIVITY): Troponin I (High Sensitivity): 3 ng/L (ref <18)

## 2021-04-12 LAB — PROTIME-INR
INR: 0.9 (ref 0.8–1.2)
Prothrombin Time: 12.5 seconds (ref 11.4–15.2)

## 2021-04-12 NOTE — ED Notes (Signed)
Visual acuity screening  Both eyes -- 20/20  OS --20/50   OD -- 20/40

## 2021-04-12 NOTE — ED Triage Notes (Signed)
See first nurse note. Pt reports this morning at 1030 had blurred vision in both eyes and staff at [facility] reports possible facial droop. Pt reports paralyzed from chest down from back surgery.  Clear speech, no facial droop,  Denies dizziness or blurred vision now. States has been having symptoms for the past few days.   MSE Darl Pikes [place]

## 2021-04-12 NOTE — ED Notes (Signed)
Pt to MRI

## 2021-04-12 NOTE — ED Notes (Signed)
Called EMS for transport back to [facility] 

## 2021-04-12 NOTE — ED Notes (Signed)
Pt returns from MRI ° °

## 2021-04-12 NOTE — ED Provider Notes (Addendum)
[facility] Emergency Department Provider Note  ____________________________________________   Event Date/Time   First MD Initiated Contact with Patient 04/12/21 1333     (approximate)  I have reviewed the triage vital signs and the nursing notes.   HISTORY  Chief Complaint Blurred Vision   HPI Wendy Nelson is a 55 y.o. 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  Patient states she thinks it was around breakfast but is not quite sure when and she states that this time her vision was very blurry in the left eye.  She states this is resolved.  She denies any headache, vertigo, or any new weakness in the extremities.  No falls or injuries, painful vision or double vision.  She states she typically wears glasses but not contacts.  She has not had any eye discharge.  She denies any acute facial pain, ear pain, chest pain, cough, shortness of breath, vomiting or any other associated sick symptoms at this time.         Past Medical History:  Diagnosis Date   Stroke [facility]) 09/2020    Patient Active Problem List   Diagnosis Date Noted   Sepsis (HCC) 12/23/2020   Neurogenic bladder    Syncope 10/19/2020   AKI (acute kidney injury) (HCC) 10/19/2020   Bilateral hydronephrosis 10/19/2020   HTN (hypertension) 10/19/2020   HLD (hyperlipidemia) 10/19/2020   Depression with anxiety 10/19/2020   Hypotension 09/2020   Cervical spondylosis with myelopathy and radiculopathy 09/2020   Spinal cord infarction [facility]) 09/2020    Past Surgical History:  Procedure Laterality Date   CERVICAL SPINE SURGERY     JOINT REPLACEMENT Right     Knee    Prior to Admission medications   Medication Sig Start Date End Date Taking? Authorizing Provider  acamprosate (CAMPRAL) 333 MG tablet Take 333 mg by mouth 3 (three) times daily. 09/05/20   [provider]  Acetaminophen 500 MG capsule Take 1,000 mg by mouth every 6 (six) hours as needed. 10/12/20   [provider]  aspirin 325 MG tablet Take 325 mg by mouth daily. 09/24/20 09/24/21  [provider]  atorvastatin (LIPITOR) 10 MG tablet Take 10 mg by mouth daily.    [provider]  bisacodyl (DULCOLAX) 10 MG suppository Place 1 suppository (10 mg total) rectally daily as needed for moderate constipation. 01/02/21   Joseph Art, DO  dicyclomine (BENTYL) 10 MG capsule Take 10 mg by mouth 3 (three) times daily with meals. 08/04/20 08/04/21  [provider]  DULoxetine (CYMBALTA) 30 MG capsule Take 60 mg by mouth daily. 10/12/20   [provider]  lidocaine (LIDODERM) 5 % Place 1 patch onto the skin daily. Remove & Discard patch within 12 hours or as directed by MD 10/21/20   Enedina Finner, MD  methocarbamol (ROBAXIN) 500 MG tablet Take 1 tablet (500 mg total) by mouth every 6 (six) hours as needed for muscle spasms. 01/02/21   Joseph Art, DO  Multiple Vitamin (MULTIVITAMIN) tablet Take 1 tablet by mouth daily.    [provider]  nystatin (MYCOSTATIN/NYSTOP) powder Apply topically 2 (two) times daily. 01/02/21   Joseph Art,  DO  omeprazole (PRILOSEC) 40 MG capsule Take 1 capsule by mouth 2 (two) times daily. 12/11/20   [provider]  polyethylene glycol (MIRALAX / GLYCOLAX) 17 g packet Take 17 g by mouth daily.    [provider]  pregabalin (LYRICA) 100 MG capsule Take 1 capsule by mouth 2 (two) times daily. 10/12/20   [provider]  senna-docusate (SENOKOT-S) 8.6-50 MG tablet Take 1 tablet by mouth daily.    [provider]  tiZANidine (ZANAFLEX) 2 MG tablet Take 2 mg by mouth 2 (two) times  daily. 09/14/20   [provider]  vitamin C (ASCORBIC ACID) 500 MG tablet Take 500 mg by mouth 2 (two) times daily.    [provider]  hydrochlorothiazide (HYDRODIURIL) 25 MG tablet Take 1 tablet by mouth daily. Patient not taking: Reported on 12/23/2020 12/14/16 12/24/20  [provider]    Allergies Melatonin, Neomycin, and Sulfa antibiotics  Family History  Problem Relation Age of Onset   Diabetes Mellitus II Mother    Hypertension Mother    Diabetes Mellitus II Brother     Social History Social History   Tobacco Use   Smoking status: Never   Smokeless tobacco: Never    Review of Systems  Review of Systems  Constitutional:  Negative for chills and fever.  HENT:  Negative for sore throat.   Eyes:  Positive for blurred vision. Negative for pain.  Respiratory:  Negative for cough and stridor.   Cardiovascular:  Negative for chest pain.  Gastrointestinal:  Negative for vomiting.  Musculoskeletal:  Negative for myalgias.  Skin:  Negative for rash.  Neurological:  Positive for focal weakness (chronic in extremities, reportedly L eye today). Negative for seizures, loss of consciousness and headaches.  Psychiatric/Behavioral:  Negative for suicidal ideas.   All other systems reviewed and are negative.    ____________________________________________   PHYSICAL EXAM:  VITAL SIGNS: ED Triage Vitals [04/12/21 1321]  Enc Vitals Group     BP (!) 104/54     Pulse Rate 71     Resp 15     Temp (!) 97.5 F (36.4 C)     Temp Source Oral     SpO2 99 %     Weight      Height      Head Circumference      Peak Flow      Pain Score      Pain Loc      Pain Edu?      Excl. in GC?    Vitals:   04/12/21 2110 04/12/21 2330  BP: 110/80 102/74  Pulse: 70 74  Resp: 14 16  Temp: 98.2 F (36.8 C)   SpO2: 96% 96%   Physical Exam Vitals and nursing note reviewed.  Constitutional:      General: She is not in acute distress.    Appearance: She is  well-developed. She is obese.  HENT:     Head: Normocephalic and atraumatic.     Right Ear: External ear normal.     Left Ear: External ear normal.     Nose: Nose normal.  Eyes:     Conjunctiva/sclera: Conjunctivae normal.  Cardiovascular:     Rate and Rhythm: Normal rate and regular rhythm.     Heart sounds: No murmur heard. Pulmonary:     Effort: Pulmonary effort is normal. No respiratory distress.     Breath sounds: Normal breath sounds.  Abdominal:     Palpations: Abdomen is  soft.     Tenderness: There is no abdominal tenderness.  Musculoskeletal:     Cervical back: Neck supple.  Skin:    General: Skin is warm and dry.  Neurological:     Mental Status: She is alert and oriented to person, place, and time.     Motor: Weakness (b/l upper and lower extremities. Patient able to do some grip strenth with upper extremities which is baseline) present.  Psychiatric:        Mood and Affect: Mood normal.    Acuity is 20/0 in the right and 20/50 in the left.  Patient states it is always worse than the left and that this is baseline.  Cranial nerves II through XII appear grossly intact.  No facial droop or ptosis.  Pupils are unremarkable.  Oropharynx is unremarkable. ____________________________________________   LABS (all labs ordered are listed, but only abnormal results are displayed)  Labs Reviewed  URINALYSIS, COMPLETE (UACMP) WITH MICROSCOPIC - Abnormal; Notable for the following components:      Result Value   Hgb urine dipstick TRACE (*)    Protein, ur TRACE (*)    Leukocytes,Ua SMALL (*)    Bacteria, UA MANY (*)    All other components within normal limits  CBC - Abnormal; Notable for the following components:   HCT 35.9 (*)    All other components within normal limits  COMPREHENSIVE METABOLIC PANEL - Abnormal; Notable for the following components:   Albumin 3.2 (*)    AST 12 (*)    All other components within normal limits  PROTIME-INR  APTT  TROPONIN I (HIGH  SENSITIVITY)   ____________________________________________  EKG  ____________________________________________  RADIOLOGY  ED MD interpretation: CT head without contrast shows no evidence intracranial hemorrhage, subacute CVA or other acute intracranial process.  Official radiology report(s): CT HEAD WO CONTRAST ( )  Result Date: 04/12/2021 CLINICAL DATA:  Weakness.  History of prior paralysis. EXAM: CT HEAD WITHOUT CONTRAST TECHNIQUE: Contiguous axial images were obtained from the base of the skull through the vertex without intravenous contrast. COMPARISON:  10/19/2020 FINDINGS: Brain: The brainstem, cerebellum, cerebral peduncles, thalami, basal ganglia, basilar cisterns, and ventricular system appear within normal limits. Partially empty sella. No intracranial hemorrhage, mass lesion, or acute CVA. Vascular: Unremarkable Skull: Unremarkable Sinuses/Orbits: Unremarkable Other: No supplemental non-categorized findings. IMPRESSION: 1. No acute intracranial findings. 2. Chronic appearance of partially empty sella. Electronically Signed   By: Gaylyn Rong M.D.   On: 04/12/2021 14:21   MR BRAIN WO CONTRAST  Result Date: 04/12/2021 CLINICAL DATA:  Transient ischemic attack EXAM: MRI HEAD WITHOUT CONTRAST TECHNIQUE: Multiplanar, multiecho pulse sequences of the brain and surrounding structures were obtained without intravenous contrast. COMPARISON:  None. FINDINGS: Brain: No acute infarct, mass effect or extra-axial collection. No acute or chronic hemorrhage. Normal white matter signal, parenchymal volume and CSF spaces. A partially empty sella is incidentally noted. Vascular: Major flow voids are preserved. Skull and upper cervical spine: Normal calvarium and skull base. Visualized upper cervical spine and soft tissues are normal. Sinuses/Orbits:No paranasal sinus fluid levels or advanced mucosal thickening. No mastoid or middle ear effusion. Normal orbits. IMPRESSION: Normal brain MRI.  Electronically Signed   By: Deatra Robinson M.D.   On: 04/12/2021 23:39    ____________________________________________   PROCEDURES  Procedure(s) performed (including Critical Care):  Procedures   ____________________________________________   INITIAL IMPRESSION / ASSESSMENT AND PLAN / ED COURSE      Patient presents with above-stated history exam for assessment of some  left-sided eyelid drooping or ptosis that cardiologist morning witnessed by staff at her facility.  Patient is unsure when this started or resolved.  She states he had some blurry vision with this but that the blurry vision has been on and off for several weeks and is also currently resolved.  She also notes that she has decreased acuity left eye at baseline.  On arrival she is afebrile hemodynamically stable.  He does have decreased acuity in the left eye she states that she has no blurry vision and cranial nerves II through XII are otherwise grossly intact.  CMP shows no significant electrolyte or metabolic derangements.  CBC shows no leukocytosis or acute anemia.  Troponin ordered in triage unremarkable.  UA with some bacteria and small excite esterase and 11-20 WBCs but given patient denying any other symptoms associated this Evalose patient for clinical surveillance at this time.  CT head without contrast shows no evidence intracranial hemorrhage, subacute CVA or other acute intracranial process.  I suspect possible retinopathy versus other nonemergent causes etiology for the blurry vision.  However this would not explain the ptosis described.  This is concerning for possible TIA as stated no evidence of this on exam.  No evidence on exam to suggest acute CVA with residual deficits.  She does have evidence of her prior deficits.  There is no pain in the eye or abnormal discharge or scleral discoloration to suggest conjunctivitis and I have a low suspicion for acute angle-closure glaucoma or corneal ulcer.  I will obtain  an MRI brain to rule out possible very small CVA but if this is negative I think patient will be stable for discharge back to nursing facility with close outpatient ophthalmology follow-up.  I discussed this with patient.  She is amenable to plan.  MRI is negative.  Given no acute objective deficits on exam with reassuring CT and MRI I think patient stable for discharge back to facility.  She will follow-up with her ophthalmologist.  Discharged stable condition.     ____________________________________________   FINAL CLINICAL IMPRESSION(S) / ED DIAGNOSES  Final diagnoses:  Ptosis of left eyelid  Blurry vision, left eye    Medications - No data to display   ED Discharge Orders     None        Note:  This document was prepared using Dragon voice recognition software and may include unintentional dictation errors.    Gilles Chiquito, MD 04/12/21 2321    Gilles Chiquito, MD 04/12/21 [phone]

## 2021-04-12 NOTE — ED Provider Notes (Addendum)
Emergency Medicine Provider Triage Evaluation Note  Wendy Nelson , a 55 y.o. female  was evaluated in triage.  Pt complains of blurred vision that has been going on and off for several days.  Return this morning around 10:30 AM.  Patient is paralyzed from the chest down.  Was taken off Eliquis last week due to hematuria.  Denies headache  Review of Systems  Positive: Blurred vision intermittent Negative: Headache, chest pain, shortness of breath  Physical Exam  BP (!) 104/54   Pulse 71   Temp (!) 97.5 F (36.4 C) (Oral)   Resp 15   SpO2 99%  Gen:   Awake, no distress   Resp:  Normal effort  MSK:   Grip in the left hand is appropriate, right hand is in a brace Other:  Patient is able to smile frown close her eyes and stick her tongue out without difficulty  Medical Decision Making  Medically screening exam initiated at 1:33 PM.  Appropriate orders placed.  Wendy Nelson was informed that the remainder of the evaluation will be completed by another provider, this initial triage assessment does not replace that evaluation, and the importance of remaining in the ED until their evaluation is complete.     Faythe Ghee, PA-C 04/12/21 1337    Faythe Ghee, PA-C 04/12/21 1337    Chesley Noon, MD 04/12/21 [phone]

## 2021-04-12 NOTE — ED Notes (Signed)
No change in baseline Neuro assessment

## 2021-04-12 NOTE — ED Triage Notes (Signed)
Pt comes into the ED via [facility] from [facility] c/o weakness.  Pt sent out for possible stroke.  Pt has baseline paralysis from the chest down s/p back surgery.  Pt denies any complaints or change in how she feels at baseline.   124/90 100% RA 70HR

## 2021-04-28 ENCOUNTER — Emergency Department: Payer: Medicare Other

## 2021-04-28 ENCOUNTER — Encounter: Payer: Self-pay | Admitting: Emergency Medicine

## 2021-04-28 ENCOUNTER — Other Ambulatory Visit: Payer: Self-pay

## 2021-04-28 DIAGNOSIS — Z7982 Long term (current) use of aspirin: Secondary | ICD-10-CM | POA: Insufficient documentation

## 2021-04-28 DIAGNOSIS — Z79899 Other long term (current) drug therapy: Secondary | ICD-10-CM | POA: Insufficient documentation

## 2021-04-28 DIAGNOSIS — Z96651 Presence of right artificial knee joint: Secondary | ICD-10-CM | POA: Diagnosis not present

## 2021-04-28 DIAGNOSIS — R059 Cough, unspecified: Secondary | ICD-10-CM | POA: Insufficient documentation

## 2021-04-28 DIAGNOSIS — I10 Essential (primary) hypertension: Secondary | ICD-10-CM | POA: Diagnosis not present

## 2021-04-28 DIAGNOSIS — R0602 Shortness of breath: Secondary | ICD-10-CM | POA: Diagnosis not present

## 2021-04-28 LAB — CBC
HCT: 37.8 % (ref 36.0–46.0)
Hemoglobin: 12.7 g/dL (ref 12.0–15.0)
MCH: 29.2 pg (ref 26.0–34.0)
MCHC: 33.6 g/dL (ref 30.0–36.0)
MCV: 86.9 fL (ref 80.0–100.0)
Platelets: 342 10*3/uL (ref 150–400)
RBC: 4.35 MIL/uL (ref 3.87–5.11)
RDW: 14.4 % (ref 11.5–15.5)
WBC: 8.8 10*3/uL (ref 4.0–10.5)
nRBC: 0 % (ref 0.0–0.2)

## 2021-04-28 LAB — BASIC METABOLIC PANEL
Anion gap: 9 (ref 5–15)
BUN: 19 mg/dL (ref 6–20)
CO2: 31 mmol/L (ref 22–32)
Calcium: 9.1 mg/dL (ref 8.9–10.3)
Chloride: 102 mmol/L (ref 98–111)
Creatinine, Ser: 0.73 mg/dL (ref 0.44–1.00)
GFR, Estimated: >60 mL/min (ref >60)
Glucose, Bld: 88 mg/dL (ref 70–99)
Potassium: 3.8 mmol/L (ref 3.5–5.1)
Sodium: 142 mmol/L (ref 135–145)

## 2021-04-28 LAB — TROPONIN I (HIGH SENSITIVITY): Troponin I (High Sensitivity): <2 ng/L (ref <18)

## 2021-04-28 NOTE — ED Triage Notes (Signed)
Pt from [facility] via with complaints of SHOB. Someone told her that she had fluid on her lungs and she could get pneumonia from it so she wanted to come here to be evaluated. VS WNL per EMS

## 2021-04-28 NOTE — ED Triage Notes (Signed)
Pt here after starting with [facility] yesterday and facility.  From [facility].  Negative covid there but pt reports they told her she had fluid on her lungs. Has been having palpitations as well.  VSS at this time. Unlabored. NAD. No increased WOB.

## 2021-04-29 ENCOUNTER — Emergency Department
Admission: EM | Admit: 2021-04-29 | Discharge: 2021-04-29 | Disposition: A | Discharge status: Home / Self Care | Payer: Medicare Other | Attending: Emergency Medicine | Admitting: Emergency Medicine

## 2021-04-29 DIAGNOSIS — R0602 Shortness of breath: Secondary | ICD-10-CM

## 2021-04-29 DIAGNOSIS — R059 Cough, unspecified: Secondary | ICD-10-CM | POA: Diagnosis not present

## 2021-04-29 DIAGNOSIS — R051 Acute cough: Secondary | ICD-10-CM

## 2021-04-29 HISTORY — DX: Quadriplegia, unspecified: G82.50

## 2021-04-29 LAB — T4, FREE: Free T4: 1.01 ng/dL (ref 0.61–1.12)

## 2021-04-29 LAB — TROPONIN I (HIGH SENSITIVITY): Troponin I (High Sensitivity): 3 ng/L (ref <18)

## 2021-04-29 LAB — BRAIN NATRIURETIC PEPTIDE: B Natriuretic Peptide: 13.3 pg/mL (ref 0.0–100.0)

## 2021-04-29 LAB — TSH: TSH: 2.158 u[IU]/mL (ref 0.350–4.500)

## 2021-04-29 NOTE — Discharge Instructions (Addendum)
Your labs today were reassuring.  Your chest x-ray showed no pneumonia or fluid on your lungs.  Your oxygen level here has been normal.  Your EKG showed no new changes.  Your cardiac labs were normal.

## 2021-04-29 NOTE — ED Provider Notes (Signed)
[facility] Emergency Department Provider Note  ____________________________________________   Event Date/Time   First MD Initiated Contact with Patient 04/29/21 [phone]     (approximate)  I have reviewed the triage vital signs and the nursing notes.   HISTORY  Chief Complaint Shortness of Breath    HPI Wendy Nelson is a 55 y.o. female with history of previous stroke with right-sided hemideficits, hypertension, hyperlipidemia, neurogenic bladder with chronic indwelling Foley catheter who presents to the emergency department from her nursing facility after she had a coughing fit tonight.  States during this episode she felt like she could not catch her breath.  States that the nursing home put her on oxygen and gave her albuterol treatment.  She states they got a chest x-ray and told her that she had "fluid on my lungs".  She came to the emergency department for further evaluation.  Denies fevers, chills, chest pain or chest discomfort, shortness of breath.  No longer coughing.  No vomiting or diarrhea.  No abdominal pain.        Past Medical History:  Diagnosis Date   Quadriplegia [facility])    Stroke (HCC) 09/2020    Patient Active Problem List   Diagnosis Date Noted   Sepsis (HCC) 12/23/2020   Neurogenic bladder    Syncope 10/19/2020   AKI (acute kidney injury) (HCC) 10/19/2020   Bilateral hydronephrosis 10/19/2020   HTN (hypertension) 10/19/2020   HLD (hyperlipidemia) 10/19/2020   Depression with anxiety 10/19/2020   Hypotension 09/2020   Cervical spondylosis with myelopathy and radiculopathy 09/2020   Spinal cord infarction [facility]) 09/2020    Past Surgical History:  Procedure Laterality Date   CERVICAL SPINE SURGERY     JOINT REPLACEMENT Right    Knee    Prior to Admission medications   Medication Sig Start Date End Date Taking? Authorizing Provider  acamprosate (CAMPRAL) 333 MG tablet Take 333 mg by mouth 3 (three) times daily. 09/05/20    [provider]  Acetaminophen 500 MG capsule Take 1,000 mg by mouth every 6 (six) hours as needed. 10/12/20   [provider]  aspirin 325 MG tablet Take 325 mg by mouth daily. 09/24/20 09/24/21  [provider]  atorvastatin (LIPITOR) 10 MG tablet Take 10 mg by mouth daily.    [provider]  bisacodyl (DULCOLAX) 10 MG suppository Place 1 suppository (10 mg total) rectally daily as needed for moderate constipation. 01/02/21   Joseph Art, DO  dicyclomine (BENTYL) 10 MG capsule Take 10 mg by mouth 3 (three) times daily with meals. 08/04/20 08/04/21  [provider]  DULoxetine (CYMBALTA) 30 MG capsule Take 60 mg by mouth daily. 10/12/20   [provider]  lidocaine (LIDODERM) 5 % Place 1 patch onto the skin daily. Remove & Discard patch within 12 hours or as directed by MD 10/21/20   Enedina Finner, MD  methocarbamol (ROBAXIN) 500 MG tablet Take 1 tablet (500 mg total) by mouth every 6 (six) hours as needed for muscle spasms. 01/02/21   Joseph Art, DO  Multiple Vitamin (MULTIVITAMIN) tablet Take 1 tablet by mouth daily.    [provider]  nystatin (MYCOSTATIN/NYSTOP) powder Apply topically 2 (two) times daily. 01/02/21   Joseph Art, DO  omeprazole (PRILOSEC) 40 MG capsule Take 1 capsule by mouth 2 (two) times daily. 12/11/20   [provider]  polyethylene glycol (MIRALAX / GLYCOLAX) 17 g packet Take 17 g by mouth daily.    [provider]  pregabalin (LYRICA) 100 MG capsule Take 1 capsule by mouth 2 (two) times daily. 10/12/20   [provider]  senna-docusate (SENOKOT-S) 8.6-50 MG tablet Take 1 tablet by mouth daily.    [provider]  tiZANidine (ZANAFLEX) 2 MG tablet Take 2 mg by mouth 2 (two) times daily. 09/14/20   [provider]  vitamin C (ASCORBIC ACID) 500 MG tablet Take 500 mg by mouth 2 (two) times daily.    [provider]  hydrochlorothiazide (HYDRODIURIL) 25 MG tablet  Take 1 tablet by mouth daily. Patient not taking: Reported on 12/23/2020 12/14/16 12/24/20  [provider]    Allergies Melatonin, Neomycin, and Sulfa antibiotics  Family History  Problem Relation Age of Onset   Diabetes Mellitus II Mother    Hypertension Mother    Diabetes Mellitus II Brother     Social History Social History   Tobacco Use   Smoking status: Never   Smokeless tobacco: Never    Review of Systems Constitutional: No fever. Eyes: No visual changes. ENT: No sore throat. Cardiovascular: Denies chest pain. Respiratory: Denies shortness of breath. Gastrointestinal: No nausea, vomiting, diarrhea. Genitourinary: Negative for dysuria. Musculoskeletal: Negative for back pain. Skin: Negative for rash. Neurological: Negative for focal weakness or numbness.  ____________________________________________   PHYSICAL EXAM:  VITAL SIGNS: ED Triage Vitals  Enc Vitals Group     BP 04/28/21 2229 102/68     Pulse Rate 04/28/21 2229 74     Resp 04/28/21 2229 18     Temp 04/28/21 2229 98 F (36.7 C)     Temp Source 04/28/21 2229 Oral     SpO2 04/28/21 2229 95 %     Weight 04/28/21 2226 250 lb (113.4 kg)     Height 04/28/21 2226 5\' 5"  (1.651 m)     Head Circumference --      [facility] Flow --      Pain Score 04/28/21 2226 0     Pain Loc --      Pain Edu? --      Excl. in GC? --    CONSTITUTIONAL: Alert and oriented and responds appropriately to questions. Well-appearing; well-nourished HEAD: Normocephalic EYES: Conjunctivae clear, pupils appear equal, EOM appear intact ENT: normal nose; moist mucous membranes NECK: Supple, normal ROM CARD: RRR; S1 and S2 appreciated; no murmurs, no clicks, no rubs, no gallops RESP: Normal chest excursion without splinting or tachypnea; breath sounds clear and equal bilaterally; no wheezes, no rhonchi, no rales, no hypoxia or respiratory distress, speaking full sentences ABD/GI: Normal bowel sounds; non-distended; soft,  non-tender, no rebound, no guarding, no peritoneal signs, no hepatosplenomegaly BACK: The back appears normal EXT: Normal ROM in all joints; no deformity noted, no edema; no cyanosis SKIN: Normal color for age and race; warm; no rash on exposed skin NEURO: Chronic right-sided weakness, normal speech PSYCH: The patient's mood and manner are appropriate.  ____________________________________________   LABS (all labs ordered are listed, but only abnormal results are displayed)  Labs Reviewed  BASIC METABOLIC PANEL  CBC  BRAIN NATRIURETIC PEPTIDE  TSH  T4, FREE  TROPONIN I (HIGH SENSITIVITY)  TROPONIN I (HIGH SENSITIVITY)   ____________________________________________  EKG   EKG Interpretation  Date/Time:  Friday April 28 2021 22:36:45 EDT Ventricular Rate:  79 PR Interval:  164 QRS Duration: 82 QT Interval:  414 QTC Calculation: 474 R Axis:   77 Text Interpretation: Normal sinus rhythm ST & T wave abnormality, consider anterior ischemia Prolonged QT Abnormal  ECG Similar to EKG October 19 2020 Confirmed by Rochele Raring [phone]) on 04/29/2021 6:05:51 AM        ____________________________________________  RADIOLOGY Normajean Baxter Jaeley Wiker, personally viewed and evaluated these images (plain radiographs) as part of my medical decision making, as well as reviewing the written report by the radiologist.  ED MD interpretation: Chest x-ray clear.  Official radiology report(s): DG Chest Port 1 View  Result Date: 04/28/2021 CLINICAL DATA:  Shortness of breath EXAM: PORTABLE CHEST 1 VIEW COMPARISON:  Chest x-ray 12/26/2020, CT chest 12/29/2020 FINDINGS: The heart and mediastinal contours are unchanged. No focal consolidation. No pulmonary edema. No pleural effusion. No pneumothorax. No acute osseous abnormality. IMPRESSION: No active disease. Electronically Signed   By: Tish Frederickson M.D.   On: 04/28/2021 22:56     ____________________________________________   PROCEDURES  Procedure(s) performed (including Critical Care):  Procedures   ____________________________________________   INITIAL IMPRESSION / ASSESSMENT AND PLAN / ED COURSE  As part of my medical decision making, I reviewed the following data within the electronic MEDICAL RECORD NUMBER Nursing notes reviewed and incorporated, Labs reviewed , EKG interpreted , Old EKG reviewed, Old chart reviewed, Radiograph reviewed , and Notes from prior ED visits         Patient here with a coughing episode at her nursing facility.  Symptoms have now resolved and she is asymptomatic.  Lungs are clear to auscultation and she has no hypoxia.  EKG shows no new ischemic change.  Troponin x2 negative.  BNP normal.  Chest x-ray shows no infiltrate, edema, pneumothorax, pneumomediastinum.  Doubt ACS, PE, dissection.  Discussed with patient that chest x-ray here does not show any acute abnormality and I feel she is safe to be discharged home.  She is comfortable with this plan.  Discussed with patient that differential includes possible early viral URI, GERD, postnasal drip.  Given she is asymptomatic I do not feel she needs further treatment.   At this time, I do not feel there is any life-threatening condition present. I have reviewed, interpreted and discussed all results (EKG, imaging, lab, urine as appropriate) and exam findings with patient/family. I have reviewed nursing notes and appropriate previous records.  I feel the patient is safe to be discharged home without further emergent workup and can continue workup as an outpatient as needed. Discussed usual and customary return precautions. Patient/family verbalize understanding and are comfortable with this plan.  Outpatient follow-up has been provided as needed. All questions have been answered.  ____________________________________________   FINAL CLINICAL IMPRESSION(S) / ED DIAGNOSES  Final  diagnoses:  Acute cough     ED Discharge Orders     None       *Please note:  Jennifier Smitherman was evaluated in Emergency Department on 04/29/2021 for the symptoms described in the history of present illness. She was evaluated in the context of the global COVID-19 pandemic, which necessitated consideration that the patient might be at risk for infection with the SARS-CoV-2 virus that causes COVID-19. Institutional protocols and algorithms that pertain to the evaluation of patients at risk for COVID-19 are in a state of rapid change based on information released by regulatory bodies including the CDC and federal and state organizations. These policies and algorithms were followed during the patient's care in the ED.  Some ED evaluations and interventions may be delayed as a result of limited staffing during and the pandemic.*   Note:  This document was prepared using Dragon voice recognition software and may include  unintentional dictation errors.    Jessamine Barcia, Layla Maw, DO 04/29/21 [phone]

## 2021-04-29 NOTE — ED Notes (Signed)
[facility] to transport to [facility] when truck is available after shift change.

## 2021-04-30 ENCOUNTER — Emergency Department
Admission: EM | Admit: 2021-04-30 | Discharge: 2021-04-30 | Disposition: A | Discharge status: Skilled Nursing Facility | Payer: Medicare Other | Attending: Emergency Medicine | Admitting: Emergency Medicine

## 2021-04-30 ENCOUNTER — Other Ambulatory Visit: Payer: Self-pay

## 2021-04-30 ENCOUNTER — Emergency Department: Payer: Medicare Other

## 2021-04-30 ENCOUNTER — Encounter: Payer: Self-pay | Admitting: Emergency Medicine

## 2021-04-30 DIAGNOSIS — R0600 Dyspnea, unspecified: Secondary | ICD-10-CM

## 2021-04-30 DIAGNOSIS — Z96651 Presence of right artificial knee joint: Secondary | ICD-10-CM | POA: Insufficient documentation

## 2021-04-30 DIAGNOSIS — R0902 Hypoxemia: Secondary | ICD-10-CM | POA: Diagnosis not present

## 2021-04-30 DIAGNOSIS — Z79899 Other long term (current) drug therapy: Secondary | ICD-10-CM | POA: Diagnosis not present

## 2021-04-30 DIAGNOSIS — I10 Essential (primary) hypertension: Secondary | ICD-10-CM | POA: Diagnosis not present

## 2021-04-30 DIAGNOSIS — E041 Nontoxic single thyroid nodule: Secondary | ICD-10-CM | POA: Diagnosis not present

## 2021-04-30 DIAGNOSIS — Z7982 Long term (current) use of aspirin: Secondary | ICD-10-CM | POA: Diagnosis not present

## 2021-04-30 LAB — BASIC METABOLIC PANEL
Anion gap: 10 (ref 5–15)
BUN: 19 mg/dL (ref 6–20)
CO2: 29 mmol/L (ref 22–32)
Calcium: 8.9 mg/dL (ref 8.9–10.3)
Chloride: 101 mmol/L (ref 98–111)
Creatinine, Ser: 0.63 mg/dL (ref 0.44–1.00)
GFR, Estimated: >60 mL/min (ref >60)
Glucose, Bld: 107 mg/dL — ABNORMAL HIGH (ref 70–99)
Potassium: 3.8 mmol/L (ref 3.5–5.1)
Sodium: 140 mmol/L (ref 135–145)

## 2021-04-30 LAB — CBC
HCT: 36.9 % (ref 36.0–46.0)
Hemoglobin: 12.5 g/dL (ref 12.0–15.0)
MCH: 29.6 pg (ref 26.0–34.0)
MCHC: 33.9 g/dL (ref 30.0–36.0)
MCV: 87.4 fL (ref 80.0–100.0)
Platelets: 392 10*3/uL (ref 150–400)
RBC: 4.22 MIL/uL (ref 3.87–5.11)
RDW: 14.6 % (ref 11.5–15.5)
WBC: 7.4 10*3/uL (ref 4.0–10.5)
nRBC: 0 % (ref 0.0–0.2)

## 2021-04-30 MED ORDER — IOHEXOL 350 MG/ML SOLN
75.0000 mL | Freq: Once | INTRAVENOUS | Status: AC | PRN
  Administered 2021-04-30: 75 mL via INTRAVENOUS

## 2021-04-30 NOTE — ED Triage Notes (Signed)
Pt coming from [facility] after she had an episode of hyperventilation this morning. Staff at [facility] reported to EMS that pts SpO2 was in the 70's however with EMS pts SpO2 has remained between 98-100%. Pt initially reported that she felt clammy but that has since resolved and pt now has no complaints. Pt is in NAD at this time.

## 2021-04-30 NOTE — ED Notes (Signed)
Called [facility] for transport to [facility]  [phone]

## 2021-04-30 NOTE — ED Notes (Signed)
First Nurse Note:  Pt to ED via [facility] from [facility] for evaluation of an episode of hyperventilating. Per staff pts SpO2 was in the 70's, EMS reports sats between 98-100% on room air. Pt has no complaints at this time. VSS at this time. Pt is bed bound.  Hx/o CHF

## 2021-04-30 NOTE — ED Provider Notes (Signed)
[facility] Emergency Department Provider Note   ____________________________________________   Event Date/Time   First MD Initiated Contact with Patient 04/30/21 1503     (approximate)  I have reviewed the triage vital signs and the nursing notes.   HISTORY  Chief Complaint Evaluation    HPI Wendy Nelson is a 55 y.o. female with a history of quadriplegia stroke, neurogenic bladder  Patient reports that this morning she felt short of breath when she woke up.  She felt a little bit cool, and started to feel like she was having difficulty breathing.  She was breathing very fast.  EMS reports they arrived and the patient had normal oxygen saturations, but [facility] had reported hypoxia.  Patient reports she feels fine now symptoms resolved.  Had no chest pain no headaches no fevers.  She is quadriplegic.  Also has some slight difficulty with use of the left hand  Currently not having any active symptoms or concerns.  She reports she feels well.  Having shortness of breath  And came to the ER yesterday for shortness of breath. Past Medical History:  Diagnosis Date   Quadriplegia [facility])    Stroke (HCC) 09/2020    Patient Active Problem List   Diagnosis Date Noted   Sepsis (HCC) 12/23/2020   Neurogenic bladder    Syncope 10/19/2020   AKI (acute kidney injury) (HCC) 10/19/2020   Bilateral hydronephrosis 10/19/2020   HTN (hypertension) 10/19/2020   HLD (hyperlipidemia) 10/19/2020   Depression with anxiety 10/19/2020   Hypotension 09/2020   Cervical spondylosis with myelopathy and radiculopathy 09/2020   Spinal cord infarction [facility]) 09/2020    Past Surgical History:  Procedure Laterality Date   CERVICAL SPINE SURGERY     JOINT REPLACEMENT Right    Knee    Prior to Admission medications   Medication Sig Start Date End Date Taking? Authorizing Provider  acamprosate (CAMPRAL) 333 MG tablet Take 333 mg by mouth 3 (three) times daily. 09/05/20    [provider]  Acetaminophen 500 MG capsule Take 1,000 mg by mouth every 6 (six) hours as needed. 10/12/20   [provider]  aspirin 325 MG tablet Take 325 mg by mouth daily. 09/24/20 09/24/21  [provider]  atorvastatin (LIPITOR) 10 MG tablet Take 10 mg by mouth daily.    [provider]  bisacodyl (DULCOLAX) 10 MG suppository Place 1 suppository (10 mg total) rectally daily as needed for moderate constipation. 01/02/21   Joseph Art, DO  dicyclomine (BENTYL) 10 MG capsule Take 10 mg by mouth 3 (three) times daily with meals. 08/04/20 08/04/21  [provider]  DULoxetine (CYMBALTA) 30 MG capsule Take 60 mg by mouth daily. 10/12/20   [provider]  lidocaine (LIDODERM) 5 % Place 1 patch onto the skin daily. Remove & Discard patch within 12 hours or as directed by MD 10/21/20   Enedina Finner, MD  methocarbamol (ROBAXIN) 500 MG tablet Take 1 tablet (500 mg total) by mouth every 6 (six) hours as needed for muscle spasms. 01/02/21   Joseph Art, DO  Multiple Vitamin (MULTIVITAMIN) tablet Take 1 tablet by mouth daily.    [provider]  nystatin (MYCOSTATIN/NYSTOP) powder Apply topically 2 (two) times daily. 01/02/21   Joseph Art, DO  omeprazole (PRILOSEC) 40 MG capsule Take 1 capsule by mouth 2 (two) times daily. 12/11/20   [provider]  polyethylene glycol (MIRALAX / GLYCOLAX) 17 g packet Take 17 g by mouth  daily.    [provider]  pregabalin (LYRICA) 100 MG capsule Take 1 capsule by mouth 2 (two) times daily. 10/12/20   [provider]  senna-docusate (SENOKOT-S) 8.6-50 MG tablet Take 1 tablet by mouth daily.    [provider]  tiZANidine (ZANAFLEX) 2 MG tablet Take 2 mg by mouth 2 (two) times daily. 09/14/20   [provider]  vitamin C (ASCORBIC ACID) 500 MG tablet Take 500 mg by mouth 2 (two) times daily.    [provider]  hydrochlorothiazide (HYDRODIURIL) 25 MG tablet  Take 1 tablet by mouth daily. Patient not taking: Reported on 12/23/2020 12/14/16 12/24/20  [provider]    Allergies Melatonin, Neomycin, and Sulfa antibiotics  Family History  Problem Relation Age of Onset   Diabetes Mellitus II Mother    Hypertension Mother    Diabetes Mellitus II Brother     Social History Social History   Tobacco Use   Smoking status: Never   Smokeless tobacco: Never    Review of Systems Constitutional: No fever did feel cold when she woke up today Eyes: No visual changes. ENT: No sore throat. Cardiovascular: Denies chest pain. Respiratory: Denies shortness of breath.  Felt very short of breath and like she could not catch her breath earlier but that is completely resolved Gastrointestinal: No abdominal pain.   Genitourinary: Negative for dysuria. Musculoskeletal: Negative for back pain. Skin: Negative for rash. Neurological: Negative for headaches, areas of focal weakness or numbness.    ____________________________________________   PHYSICAL EXAM:  VITAL SIGNS: ED Triage Vitals  Enc Vitals Group     BP 04/30/21 1442 90/60     Pulse Rate 04/30/21 1442 78     Resp 04/30/21 1442 16     Temp 04/30/21 1442 97.8 F (36.6 C)     Temp src --      SpO2 04/30/21 1442 95 %     Weight --      Height --      Head Circumference --      [facility] Flow --      Pain Score 04/30/21 1440 0     Pain Loc --      Pain Edu? --      Excl. in GC? --     Constitutional: Alert and oriented. Well appearing and in no acute distress. Eyes: Conjunctivae are normal. Head: Atraumatic. Nose: No congestion/rhinnorhea. Mouth/Throat: Mucous membranes are moist. Neck: No stridor.  Cardiovascular: Normal rate, regular rhythm. Grossly normal heart sounds.  Good peripheral circulation. Respiratory: Normal respiratory effort.  No retractions. Lungs CTAB. Gastrointestinal: Soft and nontender. No distention. Musculoskeletal: No lower extremity tenderness no  notable lower extremity edema Neurologic:  Normal speech and language. No gross focal neurologic deficits are appreciated except for weakness and some use of the left hand and also lower leg paralysis which is chronic.  Skin:  Skin is warm, dry and intact. No rash noted. Psychiatric: Mood and affect are normal. Speech and behavior are normal.  ____________________________________________   LABS (all labs ordered are listed, but only abnormal results are displayed)  Labs Reviewed  BASIC METABOLIC PANEL - Abnormal; Notable for the following components:      Result Value   Glucose, Bld 107 (*)    All other components within normal limits  CBC   ____________________________________________  EKG  Reviewed inter by me at 1600 Heart rate 75 QRS 99 QTc 480 Normal sinus rhythm, nonspecific T wave abnormality.  No evidence of  acute ischemia denoted ____________________________________________  RADIOLOGY  CT Angio Chest PE W and/or Wo Contrast  Result Date: 04/30/2021 CLINICAL DATA:  PE suspected, high prob EXAM: CT ANGIOGRAPHY CHEST WITH CONTRAST TECHNIQUE: Multidetector CT imaging of the chest was performed using the standard protocol during bolus administration of intravenous contrast. Multiplanar CT image reconstructions and MIPs were obtained to evaluate the vascular anatomy. CONTRAST:  90mL OMNIPAQUE IOHEXOL 350 MG/ML SOLN COMPARISON:  None. FINDINGS: Cardiovascular: No filling defects in the pulmonary arteries to suggest pulmonary emboli. Mild cardiomegaly. Aorta normal caliber. Mediastinum/Nodes: No mediastinal, hilar, or axillary adenopathy. Trachea and esophagus are unremarkable. Left thyroid lobe is enlarged with focal 1.6 cm lower lobe nodule. Lungs/Pleura: Linear scarring in the left base. No confluent opacities or effusions. Upper Abdomen: Imaging into the upper abdomen demonstrates no acute findings. Musculoskeletal: Chest wall soft tissues are unremarkable. No acute bony  abnormality. Review of the MIP images confirms the above findings. IMPRESSION: No evidence of pulmonary embolus. Cardiomegaly. 1.6 cm left thyroid nodule. Recommend thyroid US (ref: J Am Coll Radiol. 2015 Feb;12(2): 143-50). No acute cardiopulmonary disease. Electronically Signed   By: Charlett Nose M.D.   On: 04/30/2021 16:24     CT chest reviewed, no evidence of acute PE.  Cardiomegaly is noted.  A left thyroid nodules also noted.  I discussed with the patient her CT findings and also discussed and made recommendation that she follow-up with our ear nose and throat doctor or her primary doctor for further evaluation of the thyroid nodule.  Patient understand ____________________________________________   PROCEDURES  Procedure(s) performed: None  Procedures  Critical Care performed: No  ____________________________________________   INITIAL IMPRESSION / ASSESSMENT AND PLAN / ED COURSE  Pertinent labs & imaging results that were available during my care of the patient were reviewed by me and considered in my medical decision making (see chart for details).   Patient presents with concern of recurrent episode of shortness of breath that is now resolved.  Somewhat hard to discern, but nursing facility evidently reported patient had hypoxia, but EMS reports on their arrival oxygen saturation were normal.  She reports no other symptom other than she felt cold when she woke up this morning.  No fever very reassuring clinical exam she appears to be at her baseline at time of evaluation in the ER.  All of her symptoms are now resolved.  She had a fairly extensive work-up yesterday as well that was unrevealing as to an acute cause of shortness of breath.  Evidently the symptoms resolved, and then she experienced shortness of breath briefly this morning.  Etiology is somewhat unclear, but given her work-up yesterday and the recurrence of her symptoms I think would be prudent to obtain a CT angiogram of  the chest to evaluate for other dissectible pulmonary abnormalities including rule out PE.  My pretest probability for PE is low but she certainly has risk factors especially with her immobility surgeries etc.  EKG and labs are very reassuring and essentially normal  ----------------------------------------- 4:59 PM on 04/30/2021 ----------------------------------------- Patient is resting comfortably reviewed all results with her.  No further dyspnea she feels well awake alert without distress drinking ginger ale.  She is comfortable with plan to return to [facility]  Return precautions and treatment recommendations and follow-up discussed with the patient who is agreeable with the plan.       ____________________________________________   FINAL CLINICAL IMPRESSION(S) / ED DIAGNOSES  Final diagnoses:  Dyspnea, unspecified type  Thyroid nodule  Note:  This document was prepared using Conservation officer, historic buildings and may include unintentional dictation errors       Sharyn Creamer, MD 04/30/21 1659

## 2021-04-30 NOTE — ED Notes (Signed)
Pt unable to sign. Verbal consent for DC given by pt.   Phone sent with EMS in bag.

## 2021-05-08 ENCOUNTER — Other Ambulatory Visit: Payer: Self-pay | Admitting: Otolaryngology

## 2021-05-08 DIAGNOSIS — E041 Nontoxic single thyroid nodule: Secondary | ICD-10-CM

## 2021-05-16 ENCOUNTER — Ambulatory Visit: Admission: RE | Admit: 2021-05-16 | Payer: Medicare Other | Source: Ambulatory Visit

## 2021-06-05 ENCOUNTER — Other Ambulatory Visit: Payer: Self-pay | Admitting: Urology

## 2021-06-07 ENCOUNTER — Telehealth: Payer: Self-pay | Admitting: Urology

## 2021-06-07 NOTE — Telephone Encounter (Signed)
She will need an appointment to discuss her UDS results.

## 2021-06-27 NOTE — Progress Notes (Deleted)
06/28/2021 10:38 PM   Wendy Nelson 19-Dec-1965 [id number]  Referring provider: Center, [facility] [address] [place],  [place] [address]  No chief complaint on file.   Urological history: 1. Neurogenic bladder -09/2020-cervical spondylosis with myelopathy and radiculopathy s/p C5-7 posterior cervical laminectomy and fusion with right sided foraminotomies at [facility] 1 month ago who developed postoperative cervical spinal cord infarct  -managed with an indwelling Foley  2. Bilateral hydronephrosis -secondary to urinary retention -resolved with indwelling Foley  3. Pyelonephrosis -seen on contrast CT 12/2020 -hospitalized 12/2020  No chief complaint on file.   HPI: Wendy Nelson is a 55 y.o. female who presents today for UDS report with her daughter, Donnella Sham.    Ms. Walthall had a max capacity of approx. 526 mL.  She did not feel any sensation while filing her bladder but did feel some pressure around 515 mls inserted.  There was positive low amplitude instability.  She did not feel any sensation with these contractions dut did leak a mild amount from them.  There were no vitals spasms noted today.  She did not generate voluntary contraction and void x-ray not done due to body habitus.    PMH: Past Medical History:  Diagnosis Date   Quadriplegia [facility])    Stroke (HCC) 09/2020    Surgical History: Past Surgical History:  Procedure Laterality Date   CERVICAL SPINE SURGERY     JOINT REPLACEMENT Right    Knee    Home Medications:  Allergies as of 06/28/2021       Reactions   Melatonin    Neomycin Other (See Comments)   Positive patch test   Sulfa Antibiotics         Medication List        Accurate as of June 27, 2021 10:38 PM. If you have any questions, ask your nurse or doctor.          acamprosate 333 MG tablet Commonly known as: CAMPRAL Take 333 mg by mouth 3 (three) times daily.   Acetaminophen 500 MG capsule Take 1,000 mg by mouth  every 6 (six) hours as needed.   aspirin 325 MG tablet Take 325 mg by mouth daily.   atorvastatin 10 MG tablet Commonly known as: LIPITOR Take 10 mg by mouth daily.   bisacodyl 10 MG suppository Commonly known as: DULCOLAX Place 1 suppository (10 mg total) rectally daily as needed for moderate constipation.   dicyclomine 10 MG capsule Commonly known as: BENTYL Take 10 mg by mouth 3 (three) times daily with meals.   DULoxetine 30 MG capsule Commonly known as: CYMBALTA Take 60 mg by mouth daily.   lidocaine 5 % Commonly known as: LIDODERM Place 1 patch onto the skin daily. Remove & Discard patch within 12 hours or as directed by MD   methocarbamol 500 MG tablet Commonly known as: ROBAXIN Take 1 tablet (500 mg total) by mouth every 6 (six) hours as needed for muscle spasms.   multivitamin tablet Take 1 tablet by mouth daily.   nystatin powder Commonly known as: MYCOSTATIN/NYSTOP Apply topically 2 (two) times daily.   omeprazole 40 MG capsule Commonly known as: PRILOSEC Take 1 capsule by mouth 2 (two) times daily.   polyethylene glycol 17 g packet Commonly known as: MIRALAX / GLYCOLAX Take 17 g by mouth daily.   pregabalin 100 MG capsule Commonly known as: LYRICA Take 1 capsule by mouth 2 (two) times daily.   senna-docusate 8.6-50 MG tablet Commonly known as:  Senokot-S Take 1 tablet by mouth daily.   tiZANidine 2 MG tablet Commonly known as: ZANAFLEX Take 2 mg by mouth 2 (two) times daily.   vitamin C 500 MG tablet Commonly known as: ASCORBIC ACID Take 500 mg by mouth 2 (two) times daily.        Allergies:  Allergies  Allergen Reactions   Melatonin    Neomycin Other (See Comments)    Positive patch test   Sulfa Antibiotics     Family History: Family History  Problem Relation Age of Onset   Diabetes Mellitus II Mother    Hypertension Mother    Diabetes Mellitus II Brother     Social History:  reports that she has never smoked. She has never  used smokeless tobacco. No history on file for alcohol use and drug use.  ROS: Pertinent ROS in HPI  Physical Exam: There were no vitals taken for this visit.  Constitutional:  Well nourished. Alert and oriented, No acute distress. HEENT: [place] AT, moist mucus membranes.  Trachea midline, no masses. Cardiovascular: No clubbing, cyanosis, or edema. Respiratory: Normal respiratory effort, no increased work of breathing. GI: Abdomen is soft, non tender, non distended, no abdominal masses. Liver and spleen not palpable.  No hernias appreciated.  Stool sample for occult testing is not indicated.   GU: No CVA tenderness.  No bladder fullness or masses.  *** external genitalia, *** pubic hair distribution, no lesions.  Normal urethral meatus, no lesions, no prolapse, no discharge.   No urethral masses, tenderness and/or tenderness. No bladder fullness, tenderness or masses. *** vagina mucosa, *** estrogen effect, no discharge, no lesions, *** pelvic support, *** cystocele and *** rectocele noted.  No cervical motion tenderness.  Uterus is freely mobile and non-fixed.  No adnexal/parametria masses or tenderness noted.  Anus and perineum are without rashes or lesions.   ***  Skin: No rashes, bruises or suspicious lesions. Lymph: No cervical or inguinal adenopathy. Neurologic: Grossly intact, no focal deficits, moving all 4 extremities. Psychiatric: Normal mood and affect.    Laboratory Data: N/A   Pertinent Imaging: N/A     Assessment & Plan:    1. Gross hematuria -Discuss causes of blood in the urine, such as UTI, stones and/or cancer -She has had a renal ultrasound, renal stone study, a noncontrast CT and a contrast CT within the last 5 months - no no malignancies noted -Discussed undergoing a cystoscopy at this time vs waiting until gross heme occurs again as a gross heme did clear with antibiotics -They have decided to defer at this time unless the gross heme reoccurs  2. Neurogenic  bladder -refer back for UDS at [facility] -RUS and cysto in 09/2021 with Dr. Erlene Quan   No follow-ups on file.  These notes generated with voice recognition software. I apologize for typographical errors.  Zara Council, PA-C  [facility] [address]  [place], [place] [address] [phone]

## 2021-06-28 ENCOUNTER — Encounter: Payer: Self-pay | Admitting: Urology

## 2021-06-28 ENCOUNTER — Ambulatory Visit: Payer: Medicare Other | Admitting: Urology

## 2021-06-28 DIAGNOSIS — N319 Neuromuscular dysfunction of bladder, unspecified: Secondary | ICD-10-CM

## 2021-07-20 ENCOUNTER — Other Ambulatory Visit: Payer: Self-pay | Admitting: Gerontology

## 2021-07-20 DIAGNOSIS — Z1231 Encounter for screening mammogram for malignant neoplasm of breast: Secondary | ICD-10-CM

## 2021-08-01 NOTE — Progress Notes (Signed)
08/02/2021 4:31 PM   Wendy Nelson 05/27/1966 [id number]  Referring provider: [facility], [facility] [address] [place],  [place] [address]  Chief Complaint  Patient presents with   Results   Urological history 1. Neurogenic bladder -09/2020 cervical spondylosis with myelopathy and radiculopathy s/p C5-7 posterior cervical laminectomy and fusion with right sided foraminotomies at [facility] and developed postoperative cervical spinal cord infarct  -manage with an indwelling Foley  2. Bilateral hydronephrosis -secondary to urinary retention -resolved with indwelling Foley  3. Pyelonephrosis -seen on contrast CT 12/2020 -hospitalized 12/2020  HPI: Wendy Nelson is a 56 y.o. female who presents today for UDS report with her son, Jill Alexanders  [name] had a max capacity of approx. 526 mL.  She did not feel any sensation while filing her bladder but did feel some pressure around 515 mls inserted.  There was positive low amplitude instability.  She did not feel any sensation with these contractions dut did leak a mild amount from them.  There were no vitals spasms noted today.  She did not generate voluntary contraction and void x-ray not done due to body habitus.    She states that she has noted a dark almost black urine in the catheter tube for several days last week.  Her son states that he received a call from [facility] and was informed that she had blood coming from her rectum.  He stated that all the information they could give him and when he said he had a follow-up with [place] this week, they said good.  Unfortunately, today I do not have any records regarding any notations by staff of discolored urine, no urinalysis, no urine culture results or any other tests that were performed due to this complaint.  She states that she has not had any pain, fever, nausea associated with the dark-colored urine.     PMH: Past Medical History:  Diagnosis Date   Quadriplegia [facility])     Stroke (HCC) 09/2020    Surgical History: Past Surgical History:  Procedure Laterality Date   CERVICAL SPINE SURGERY     JOINT REPLACEMENT Right    Knee    Home Medications:  Allergies as of 08/02/2021       Reactions   Melatonin    Neomycin Other (See Comments)   Positive patch test   Sulfa Antibiotics         Medication List        Accurate as of August 02, 2021 11:59 PM. If you have any questions, ask your nurse or doctor.          acamprosate 333 MG tablet Commonly known as: CAMPRAL Take 333 mg by mouth 3 (three) times daily.   Acetaminophen 500 MG capsule Take 1,000 mg by mouth every 6 (six) hours as needed.   aspirin 325 MG tablet Take 325 mg by mouth daily.   atorvastatin 10 MG tablet Commonly known as: LIPITOR Take 10 mg by mouth daily.   bisacodyl 10 MG suppository Commonly known as: DULCOLAX Place 1 suppository (10 mg total) rectally daily as needed for moderate constipation.   dicyclomine 10 MG capsule Commonly known as: BENTYL Take 10 mg by mouth 3 (three) times daily with meals.   DULoxetine 30 MG capsule Commonly known as: CYMBALTA Take 60 mg by mouth daily.   lidocaine 5 % Commonly known as: LIDODERM Place 1 patch onto the skin daily. Remove & Discard patch within 12 hours or as directed by MD  methocarbamol 500 MG tablet Commonly known as: ROBAXIN Take 1 tablet (500 mg total) by mouth every 6 (six) hours as needed for muscle spasms.   multivitamin tablet Take 1 tablet by mouth daily.   nystatin powder Commonly known as: MYCOSTATIN/NYSTOP Apply topically 2 (two) times daily.   omeprazole 40 MG capsule Commonly known as: PRILOSEC Take 1 capsule by mouth 2 (two) times daily.   polyethylene glycol 17 g packet Commonly known as: MIRALAX / GLYCOLAX Take 17 g by mouth daily.   pregabalin 100 MG capsule Commonly known as: LYRICA Take 1 capsule by mouth 2 (two) times daily.   senna-docusate 8.6-50 MG tablet Commonly known  as: Senokot-S Take 1 tablet by mouth daily.   tiZANidine 2 MG tablet Commonly known as: ZANAFLEX Take 2 mg by mouth 2 (two) times daily.   vitamin C 500 MG tablet Commonly known as: ASCORBIC ACID Take 500 mg by mouth 2 (two) times daily.        Allergies:  Allergies  Allergen Reactions   Melatonin    Neomycin Other (See Comments)    Positive patch test   Sulfa Antibiotics     Family History: Family History  Problem Relation Age of Onset   Diabetes Mellitus II Mother    Hypertension Mother    Diabetes Mellitus II Brother     Social History:  reports that she has never smoked. She has never used smokeless tobacco. No history on file for alcohol use and drug use.  ROS: Pertinent ROS in HPI  Physical Exam: BP 137/90    Pulse 76   Constitutional:  Well nourished. Alert and oriented, No acute distress. HEENT: [place] AT, mask in place.  Trachea midline Cardiovascular: No clubbing, cyanosis, or edema. Respiratory: Normal respiratory effort, no increased work of breathing. Neurologic: Grossly intact, no focal deficits, moving all 4 extremities. Psychiatric: Normal mood and affect.    Laboratory Data: Lab Results  Component Value Date   WBC 7.4 04/30/2021   HGB 12.5 04/30/2021   HCT 36.9 04/30/2021   MCV 87.4 04/30/2021   PLT 392 04/30/2021    Lab Results  Component Value Date   CREATININE 0.63 04/30/2021    Lab Results  Component Value Date   TSH 2.158 04/28/2021    Lab Results  Component Value Date   AST 12 (L) 04/12/2021   Lab Results  Component Value Date   ALT 14 04/12/2021     Urinalysis    Component Value Date/Time   COLORURINE YELLOW 04/12/2021 2104   APPEARANCEUR CLEAR 04/12/2021 2104   LABSPEC 1.025 04/12/2021 2104   PHURINE 6.5 04/12/2021 2104   GLUCOSEU NEGATIVE 04/12/2021 2104   HGBUR TRACE (A) 04/12/2021 2104   BILIRUBINUR NEGATIVE 04/12/2021 2104   KETONESUR NEGATIVE 04/12/2021 2104   PROTEINUR TRACE (A) 04/12/2021 2104    NITRITE NEGATIVE 04/12/2021 2104   LEUKOCYTESUR SMALL (A) 04/12/2021 2104  I have reviewed the labs.   Pertinent Imaging: N/A  Assessment & Plan:    1. Neurogenic bladder -RUS and cysto in 09/2021 with Dr. Erlene Quan  2. Gross hematuria -She states that the gross hematuria has returned again -Urine in the tube is yellow today -We will contact [facility] and obtain records -We may need to pursue a CT urogram versus renal ultrasound with cystoscopy pending record  Return for return for RUS and cysto as scheduled .  These notes generated with voice recognition software. I apologize for typographical errors.  Zara Council, [facility]  [place]  [place], [place] [address] [phone]

## 2021-08-02 ENCOUNTER — Encounter: Payer: Self-pay | Admitting: Urology

## 2021-08-02 ENCOUNTER — Other Ambulatory Visit: Payer: Self-pay

## 2021-08-02 ENCOUNTER — Ambulatory Visit (INDEPENDENT_AMBULATORY_CARE_PROVIDER_SITE_OTHER): Payer: Medicare Other | Admitting: Urology

## 2021-08-02 VITALS — BP 137/90 | HR 76

## 2021-08-02 DIAGNOSIS — N319 Neuromuscular dysfunction of bladder, unspecified: Secondary | ICD-10-CM | POA: Diagnosis not present

## 2021-08-02 DIAGNOSIS — R310 Gross hematuria: Secondary | ICD-10-CM

## 2021-08-09 ENCOUNTER — Telehealth: Payer: Self-pay | Admitting: Urology

## 2021-08-09 NOTE — Telephone Encounter (Signed)
Would you call [facility] and have them fax over the records that we requested on the 19th regarding any blood in the urine, issues with the catheter, urinalysis or urine cultures?  We sent them a records release on the 19th.

## 2021-08-14 NOTE — Telephone Encounter (Signed)
The person I spoke with today was Wendy Nelson.

## 2021-08-14 NOTE — Telephone Encounter (Signed)
Spoke with someone at [facility] and she said she would work on sending them over today.

## 2021-08-20 ENCOUNTER — Telehealth: Payer: Self-pay | Admitting: Urology

## 2021-08-20 DIAGNOSIS — R310 Gross hematuria: Secondary | ICD-10-CM

## 2021-08-20 NOTE — Telephone Encounter (Signed)
Notes received from [facility] stating she had gross heme with a negative urine culture.  We need to change her renal ultrasound to a CT urogram and she needs to keep her cysto appointment with Dr. Apolinar Junes on 10/03/2021.  I have placed the order for the CT urogram, so we need to cancel her RUS.

## 2021-08-21 NOTE — Telephone Encounter (Signed)
Renal ultrasound canceled.

## 2021-08-30 ENCOUNTER — Other Ambulatory Visit: Payer: Self-pay

## 2021-08-30 ENCOUNTER — Ambulatory Visit
Admission: RE | Admit: 2021-08-30 | Discharge: 2021-08-30 | Disposition: A | Discharge status: Home / Self Care | Payer: Medicare Other | Source: Ambulatory Visit | Attending: Gerontology | Admitting: Gerontology

## 2021-08-30 DIAGNOSIS — Z1231 Encounter for screening mammogram for malignant neoplasm of breast: Secondary | ICD-10-CM | POA: Insufficient documentation

## 2021-09-07 ENCOUNTER — Ambulatory Visit
Admission: RE | Admit: 2021-09-07 | Discharge: 2021-09-07 | Disposition: A | Discharge status: Home / Self Care | Payer: Medicare Other | Source: Ambulatory Visit | Attending: Urology | Admitting: Urology

## 2021-09-07 ENCOUNTER — Other Ambulatory Visit: Payer: Self-pay

## 2021-09-07 ENCOUNTER — Inpatient Hospital Stay
Admission: RE | Admit: 2021-09-07 | Discharge: 2021-09-07 | Disposition: A | Discharge status: Home / Self Care | Payer: Self-pay | Source: Ambulatory Visit | Attending: *Deleted | Admitting: *Deleted

## 2021-09-07 ENCOUNTER — Other Ambulatory Visit: Payer: Self-pay | Admitting: *Deleted

## 2021-09-07 DIAGNOSIS — Z1231 Encounter for screening mammogram for malignant neoplasm of breast: Secondary | ICD-10-CM

## 2021-09-07 DIAGNOSIS — R310 Gross hematuria: Secondary | ICD-10-CM | POA: Diagnosis present

## 2021-09-07 LAB — POCT I-STAT CREATININE: Creatinine, Ser: 0.7 mg/dL (ref 0.44–1.00)

## 2021-09-07 MED ORDER — IOHEXOL 350 MG/ML SOLN
100.0000 mL | Freq: Once | INTRAVENOUS | Status: AC | PRN
  Administered 2021-09-07: 100 mL via INTRAVENOUS

## 2021-09-22 ENCOUNTER — Telehealth: Payer: Self-pay

## 2021-09-22 NOTE — Telephone Encounter (Signed)
I spoke with NP Jonna Clark [phone] at the facility Wendy Nelson is currently at. She is concerned about the number of UTI`s this patient is getting. She states the reason the nurses check her urine is from the foul odor in her catheter bag. She states she treated Wendy Nelson in January with an antibiotic called Invanz. I advised her she was scheduled for a cystoscopy and we did order a RUS. She just wanted me to make you aware and see what she can do to help prevent patients uti`s. ?

## 2021-10-02 NOTE — Progress Notes (Signed)
? ?  10/03/21 ? ?CC:  ?Chief Complaint  ?Patient presents with  ? Cysto  ? ? ? ?HPI: ?Wendy Nelson is a 56 y.o.female with a personal history of neurogenic bladder, bilateral hydronephrosis, pyelonephrosis, and gross hematuria, who presents today for a diagnostic cystoscopy.  ? ?She underwent a UDS and had a max capacity of approx. 526 mL.  She did not feel any sensation while filing her bladder but did feel some pressure around 515 mls inserted.  There was positive low amplitude instability.  She did not feel any sensation with these contractions but did leak a mild amount from them.  There were no vitals spasms noted today.  She did not generate voluntary contraction and void x-ray not done due to body habitus.   ?  ?She was seen by Michiel Cowboy, PA-C, on 08/02/2021 patient stated that she had dark black urine in the catheter tube for several days. She also had blood coming from her rectum. Urinalysis was negative for blood.  ? ?She underwent a CT urogram on 09/07/2021 that visualized Perivesicular stranding with symmetric wall thickening of the urinary bladder which is decompressed around a Foley catheter. Findings may represent cystitis.Prominence of the right ureter and collecting system without ?discrete hydronephrosis.No renal, ureteral, or bladder calculi identified.No solid enhancing renal mass. ? ?Vitals:  ? 10/03/21 1445  ?BP: 103/72  ?Pulse: 88  ? ?NED. A&Ox3.   ?No respiratory distress   ?Abd soft, NT, ND ?Normal external genitalia with patent urethral meatus ? ?Cystoscopy Procedure Note ? ?Patient identification was confirmed, informed consent was obtained, and patient was prepped using Betadine solution.  Lidocaine jelly was administered per urethral meatus.  Habitus made cystoscopy today very difficult. ? ?Procedure: ?- Flexible cystoscope introduced, without any difficulty.   ?- Thorough search of the bladder revealed: ?   Diffuse catheter cystitis primarily on the posterior bladder wall  ? ?-  Ureteral orifices were normal in position and appearance. ? ?Post-Procedure: ?- Patient tolerated the procedure well ? ?Assessment/ Plan: ? ?1. Neurogenic bladder ?- Managed with indwelling Foley catheter  ?- 18 FR Catheter was exchanged today, 20 cc in the balloon ?- She was given a one time dose of Keflex  ? ?2. Chronic catheter cystitis  ?- Likely cause of gross hematuria  ?- CTU showed findings consistent with cystitis  ?- Cystoscopy showed cystitis primarily on the posterior bladder wall  ? ? ?F/u annually with shannon ? ?Tawni Millers as a scribe for Vanna Scotland, MD.,have documented all relevant documentation on the behalf of Vanna Scotland, MD,as directed by  Vanna Scotland, MD while in the presence of Vanna Scotland, MD. ? ?I have reviewed the above documentation for accuracy and completeness, and I agree with the above.  ? ?Vanna Scotland, MD ? ?

## 2021-10-03 ENCOUNTER — Encounter: Payer: Self-pay | Admitting: Urology

## 2021-10-03 ENCOUNTER — Ambulatory Visit (INDEPENDENT_AMBULATORY_CARE_PROVIDER_SITE_OTHER): Payer: Medicare Other | Admitting: Urology

## 2021-10-03 ENCOUNTER — Other Ambulatory Visit: Payer: Self-pay

## 2021-10-03 VITALS — BP 103/72 | HR 88 | Ht 65.0 in

## 2021-10-03 DIAGNOSIS — R310 Gross hematuria: Secondary | ICD-10-CM

## 2021-10-03 MED ORDER — CEPHALEXIN 250 MG PO CAPS
500.0000 mg | ORAL_CAPSULE | Freq: Once | ORAL | Status: AC
  Administered 2021-10-03: 500 mg via ORAL

## 2021-10-03 NOTE — Patient Instructions (Signed)
Follow up as needed

## 2021-10-09 ENCOUNTER — Telehealth: Payer: Self-pay | Admitting: *Deleted

## 2021-10-09 NOTE — Telephone Encounter (Addendum)
Left VM for son, Jill Alexanders to return call, needs to schedule follow up for mother ? ? ?----- Message from Vanna Scotland, MD sent at 10/04/2021  8:24 AM EDT ----- ?This patient probably needs to follow-up annually with Carollee Herter, please schedule ?

## 2021-10-30 ENCOUNTER — Other Ambulatory Visit: Payer: Self-pay

## 2021-10-30 ENCOUNTER — Emergency Department: Payer: Medicare Other

## 2021-10-30 ENCOUNTER — Inpatient Hospital Stay
Admission: EM | Admit: 2021-10-30 | Discharge: 2021-11-03 | DRG: 698 | Disposition: A | Discharge status: Skilled Nursing Facility | Payer: Medicare Other | Source: Skilled Nursing Facility | Attending: Internal Medicine | Admitting: Internal Medicine

## 2021-10-30 DIAGNOSIS — B9620 Unspecified Escherichia coli [E. coli] as the cause of diseases classified elsewhere: Secondary | ICD-10-CM | POA: Diagnosis present

## 2021-10-30 DIAGNOSIS — B961 Klebsiella pneumoniae [K. pneumoniae] as the cause of diseases classified elsewhere: Secondary | ICD-10-CM | POA: Diagnosis present

## 2021-10-30 DIAGNOSIS — K219 Gastro-esophageal reflux disease without esophagitis: Secondary | ICD-10-CM | POA: Diagnosis present

## 2021-10-30 DIAGNOSIS — G9341 Metabolic encephalopathy: Secondary | ICD-10-CM | POA: Diagnosis present

## 2021-10-30 DIAGNOSIS — E785 Hyperlipidemia, unspecified: Secondary | ICD-10-CM | POA: Diagnosis present

## 2021-10-30 DIAGNOSIS — T83511A Infection and inflammatory reaction due to indwelling urethral catheter, initial encounter: Principal | ICD-10-CM | POA: Diagnosis present

## 2021-10-30 DIAGNOSIS — Y846 Urinary catheterization as the cause of abnormal reaction of the patient, or of later complication, without mention of misadventure at the time of the procedure: Secondary | ICD-10-CM | POA: Diagnosis present

## 2021-10-30 DIAGNOSIS — Z20822 Contact with and (suspected) exposure to covid-19: Secondary | ICD-10-CM | POA: Diagnosis present

## 2021-10-30 DIAGNOSIS — N319 Neuromuscular dysfunction of bladder, unspecified: Secondary | ICD-10-CM | POA: Diagnosis present

## 2021-10-30 DIAGNOSIS — F418 Other specified anxiety disorders: Secondary | ICD-10-CM | POA: Diagnosis present

## 2021-10-30 DIAGNOSIS — N390 Urinary tract infection, site not specified: Principal | ICD-10-CM | POA: Diagnosis present

## 2021-10-30 DIAGNOSIS — E876 Hypokalemia: Secondary | ICD-10-CM | POA: Diagnosis not present

## 2021-10-30 DIAGNOSIS — Z8249 Family history of ischemic heart disease and other diseases of the circulatory system: Secondary | ICD-10-CM

## 2021-10-30 DIAGNOSIS — Z79899 Other long term (current) drug therapy: Secondary | ICD-10-CM | POA: Diagnosis not present

## 2021-10-30 DIAGNOSIS — I2510 Atherosclerotic heart disease of native coronary artery without angina pectoris: Secondary | ICD-10-CM | POA: Diagnosis present

## 2021-10-30 DIAGNOSIS — Z6841 Body Mass Index (BMI) 40.0 and over, adult: Secondary | ICD-10-CM

## 2021-10-30 DIAGNOSIS — K5900 Constipation, unspecified: Secondary | ICD-10-CM | POA: Diagnosis present

## 2021-10-30 DIAGNOSIS — I959 Hypotension, unspecified: Secondary | ICD-10-CM | POA: Diagnosis present

## 2021-10-30 DIAGNOSIS — F32A Depression, unspecified: Secondary | ICD-10-CM | POA: Diagnosis present

## 2021-10-30 DIAGNOSIS — E872 Acidosis, unspecified: Secondary | ICD-10-CM | POA: Diagnosis present

## 2021-10-30 DIAGNOSIS — B952 Enterococcus as the cause of diseases classified elsewhere: Secondary | ICD-10-CM | POA: Diagnosis present

## 2021-10-30 DIAGNOSIS — Z96651 Presence of right artificial knee joint: Secondary | ICD-10-CM | POA: Diagnosis present

## 2021-10-30 DIAGNOSIS — Z882 Allergy status to sulfonamides status: Secondary | ICD-10-CM | POA: Diagnosis not present

## 2021-10-30 DIAGNOSIS — I10 Essential (primary) hypertension: Secondary | ICD-10-CM | POA: Diagnosis present

## 2021-10-30 DIAGNOSIS — F419 Anxiety disorder, unspecified: Secondary | ICD-10-CM | POA: Diagnosis present

## 2021-10-30 DIAGNOSIS — Z833 Family history of diabetes mellitus: Secondary | ICD-10-CM

## 2021-10-30 DIAGNOSIS — N318 Other neuromuscular dysfunction of bladder: Secondary | ICD-10-CM | POA: Diagnosis present

## 2021-10-30 DIAGNOSIS — G8254 Quadriplegia, C5-C7 incomplete: Secondary | ICD-10-CM | POA: Diagnosis present

## 2021-10-30 DIAGNOSIS — R6510 Systemic inflammatory response syndrome (SIRS) of non-infectious origin without acute organ dysfunction: Secondary | ICD-10-CM

## 2021-10-30 LAB — URINALYSIS, COMPLETE (UACMP) WITH MICROSCOPIC
Bilirubin Urine: NEGATIVE
Glucose, UA: NEGATIVE mg/dL
Ketones, ur: NEGATIVE mg/dL
Nitrite: NEGATIVE
Protein, ur: 100 mg/dL — AB
RBC / HPF: >50 RBC/hpf — ABNORMAL HIGH (ref 0–5)
Specific Gravity, Urine: 1.017 (ref 1.005–1.030)
Squamous Epithelial / HPF: NONE SEEN (ref 0–5)
WBC, UA: >50 WBC/hpf — ABNORMAL HIGH (ref 0–5)
pH: 7 (ref 5.0–8.0)

## 2021-10-30 LAB — CBC WITH DIFFERENTIAL/PLATELET
Abs Immature Granulocytes: 0.04 10*3/uL (ref 0.00–0.07)
Basophils Absolute: 0.1 10*3/uL (ref 0.0–0.1)
Basophils Relative: 1 %
Eosinophils Absolute: 0.3 10*3/uL (ref 0.0–0.5)
Eosinophils Relative: 3 %
HCT: 32.6 % — ABNORMAL LOW (ref 36.0–46.0)
Hemoglobin: 10.3 g/dL — ABNORMAL LOW (ref 12.0–15.0)
Immature Granulocytes: 0 %
Lymphocytes Relative: 31 %
Lymphs Abs: 3.2 10*3/uL (ref 0.7–4.0)
MCH: 26.8 pg (ref 26.0–34.0)
MCHC: 31.6 g/dL (ref 30.0–36.0)
MCV: 84.9 fL (ref 80.0–100.0)
Monocytes Absolute: 0.8 10*3/uL (ref 0.1–1.0)
Monocytes Relative: 8 %
Neutro Abs: 6.1 10*3/uL (ref 1.7–7.7)
Neutrophils Relative %: 57 %
Platelets: 370 10*3/uL (ref 150–400)
RBC: 3.84 MIL/uL — ABNORMAL LOW (ref 3.87–5.11)
RDW: 14.8 % (ref 11.5–15.5)
WBC: 10.4 10*3/uL (ref 4.0–10.5)
nRBC: 0 % (ref 0.0–0.2)

## 2021-10-30 LAB — APTT: aPTT: 20 seconds — ABNORMAL LOW (ref 24–36)

## 2021-10-30 LAB — COMPREHENSIVE METABOLIC PANEL
ALT: 27 U/L (ref 0–44)
AST: 33 U/L (ref 15–41)
Albumin: 2.7 g/dL — ABNORMAL LOW (ref 3.5–5.0)
Alkaline Phosphatase: 152 U/L — ABNORMAL HIGH (ref 38–126)
Anion gap: 11 (ref 5–15)
BUN: 15 mg/dL (ref 6–20)
CO2: 24 mmol/L (ref 22–32)
Calcium: 8.2 mg/dL — ABNORMAL LOW (ref 8.9–10.3)
Chloride: 101 mmol/L (ref 98–111)
Creatinine, Ser: 0.79 mg/dL (ref 0.44–1.00)
GFR, Estimated: >60 mL/min (ref >60)
Glucose, Bld: 112 mg/dL — ABNORMAL HIGH (ref 70–99)
Potassium: 3.6 mmol/L (ref 3.5–5.1)
Sodium: 136 mmol/L (ref 135–145)
Total Bilirubin: 0.8 mg/dL (ref 0.3–1.2)
Total Protein: 7.5 g/dL (ref 6.5–8.1)

## 2021-10-30 LAB — PROTIME-INR
INR: 1.2 (ref 0.8–1.2)
Prothrombin Time: 15.2 seconds (ref 11.4–15.2)

## 2021-10-30 LAB — RESP PANEL BY RT-PCR (FLU A&B, COVID) ARPGX2
Influenza A by PCR: NEGATIVE
Influenza B by PCR: NEGATIVE
SARS Coronavirus 2 by RT PCR: NEGATIVE

## 2021-10-30 LAB — LACTIC ACID, PLASMA
Lactic Acid, Venous: 1.3 mmol/L (ref 0.5–1.9)
Lactic Acid, Venous: 3.2 mmol/L (ref 0.5–1.9)

## 2021-10-30 LAB — LIPASE, BLOOD: Lipase: 25 U/L (ref 11–51)

## 2021-10-30 LAB — TROPONIN I (HIGH SENSITIVITY)
Troponin I (High Sensitivity): 4 ng/L (ref <18)
Troponin I (High Sensitivity): 4 ng/L (ref <18)

## 2021-10-30 LAB — PROCALCITONIN: Procalcitonin: 0.15 ng/mL

## 2021-10-30 LAB — CORTISOL-AM, BLOOD: Cortisol - AM: 14.7 ug/dL (ref 6.7–22.6)

## 2021-10-30 LAB — HIV ANTIBODY (ROUTINE TESTING W REFLEX): HIV Screen 4th Generation wRfx: NONREACTIVE

## 2021-10-30 MED ORDER — ENOXAPARIN SODIUM 60 MG/0.6ML IJ SOSY
0.5000 mg/kg | PREFILLED_SYRINGE | INTRAMUSCULAR | Status: DC
  Administered 2021-10-30 – 2021-11-03 (×5): 60 mg via SUBCUTANEOUS
  Filled 2021-10-30 (×5): qty 0.6

## 2021-10-30 MED ORDER — ONDANSETRON HCL 4 MG/2ML IJ SOLN: 4.0000 mg | Freq: Four times a day (QID) | INTRAMUSCULAR | Status: DC | PRN

## 2021-10-30 MED ORDER — ENOXAPARIN SODIUM 40 MG/0.4ML IJ SOSY: 40.0000 mg | PREFILLED_SYRINGE | INTRAMUSCULAR | Status: DC

## 2021-10-30 MED ORDER — DULOXETINE HCL 30 MG PO CPEP
60.0000 mg | ORAL_CAPSULE | Freq: Every day | ORAL | Status: DC
  Administered 2021-10-30 – 2021-11-01 (×3): 60 mg via ORAL
  Filled 2021-10-30: qty 1
  Filled 2021-10-30 (×2): qty 2

## 2021-10-30 MED ORDER — SODIUM CHLORIDE 0.9 % IV SOLN
2.0000 g | Freq: Once | INTRAVENOUS | Status: AC
  Administered 2021-10-30: 2 g via INTRAVENOUS
  Filled 2021-10-30: qty 12.5

## 2021-10-30 MED ORDER — ACETAMINOPHEN 650 MG RE SUPP: 650.0000 mg | Freq: Four times a day (QID) | RECTAL | Status: DC | PRN

## 2021-10-30 MED ORDER — SODIUM CHLORIDE 0.9 % IV SOLN
2.0000 g | Freq: Two times a day (BID) | INTRAVENOUS | Status: DC
  Administered 2021-10-30 – 2021-11-01 (×4): 2 g via INTRAVENOUS
  Filled 2021-10-30 (×3): qty 2
  Filled 2021-10-30: qty 12.5

## 2021-10-30 MED ORDER — LACTATED RINGERS IV SOLN: INTRAVENOUS | Status: AC

## 2021-10-30 MED ORDER — ACETAMINOPHEN 325 MG PO TABS: 650.0000 mg | ORAL_TABLET | Freq: Four times a day (QID) | ORAL | Status: DC | PRN

## 2021-10-30 MED ORDER — LACTATED RINGERS IV BOLUS (SEPSIS)
1000.0000 mL | Freq: Once | INTRAVENOUS | Status: AC
  Administered 2021-10-30: 1000 mL via INTRAVENOUS

## 2021-10-30 MED ORDER — ONDANSETRON HCL 4 MG PO TABS: 4.0000 mg | ORAL_TABLET | Freq: Four times a day (QID) | ORAL | Status: DC | PRN

## 2021-10-30 MED ORDER — APIXABAN 5 MG PO TABS: 5.0000 mg | ORAL_TABLET | Freq: Two times a day (BID) | ORAL | Status: DC

## 2021-10-30 MED ORDER — TRAZODONE HCL 50 MG PO TABS
50.0000 mg | ORAL_TABLET | Freq: Every day | ORAL | Status: DC
  Administered 2021-10-30 – 2021-11-02 (×4): 50 mg via ORAL
  Filled 2021-10-30 (×4): qty 1

## 2021-10-30 NOTE — Consult Note (Signed)
CODE SEPSIS - PHARMACY COMMUNICATION ? ?**Broad Spectrum Antibiotics should be administered within 1 hour of Sepsis diagnosis** ? ?Time Code Sepsis Called/Page Received: 6384 ? ?Antibiotics Ordered: cefepime ? ?Time of 1st antibiotic administration: 0422 ? ?Additional action taken by pharmacy: none ? ?If necessary, Name of Provider/Nurse Contacted: n/a ? ? ? ?Shatonya Passon Rodriguez-Guzman PharmD, BCPS ?10/30/2021 3:29 AM ? ? ?

## 2021-10-30 NOTE — Assessment & Plan Note (Signed)
   Holding home antihypertensives due to hypotension 

## 2021-10-30 NOTE — Care Plan (Signed)
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  Patient was hypotensive on arrival, blood pressure improved with IV fluid bolus.  Patient admitted for complicated UTI started on IV antibiotics blood and urine cultures were sent.  Patient was seen and examined at bedside. ?

## 2021-10-30 NOTE — Assessment & Plan Note (Signed)
Secondary to spinal cord infarction ?Increase nursing assistance for transfers.  Decubitus precautions ?

## 2021-10-30 NOTE — ED Notes (Signed)
Pt saturations dropping to mid 80s. Placed on 2L [place] and saturations currently 94 to 97% ?

## 2021-10-30 NOTE — Consult Note (Signed)
Pharmacy Antibiotic Note ? ?Wendy Nelson is a 56 y.o. female admitted on 10/30/2021 with UTI.  Pharmacy has been consulted for Cefepime dosing. ? ?Plan: ?Cefepime 2gm q 12 hours ? ? ?Temp (24hrs), Avg:99.6 ?F (37.6 ?C), Min:99.6 ?F (37.6 ?C), Max:99.6 ?F (37.6 ?C) ? ?Recent Labs  ?Lab 10/30/21 ?0107  ?WBC 10.4  ?CREATININE 0.79  ?LATICACIDVEN 1.3  ?  ?CrCl cannot be calculated (Unknown ideal weight.).   ? ?Allergies  ?Allergen Reactions  ? Melatonin   ? Neomycin Other (See Comments)  ?  Positive patch test  ? Sulfa Antibiotics   ? ? ?Antimicrobials this admission: ?4/17 Cefepime >>  ? ?Dose adjustments this admission: none ? ?Microbiology results: ?4/17 BCx: collected ?4/17 UCx: collected  ? ? ?Thank you for allowing pharmacy to be a part of this patient?s care. ? ?Jaclyn Andy Rodriguez-Guzman PharmD, BCPS ?10/30/2021 3:57 AM ? ? ?

## 2021-10-30 NOTE — [place] FL2 (Signed)
?[place] MEDICAID FL2 LEVEL OF CARE SCREENING TOOL  ?  ? ?IDENTIFICATION  ?Patient Name: ?Wendy Nelson Birthdate: 01/17/1966 Sex: female Admission Date (Current Location): ?10/30/2021  ?[place] ?  Facility and Address:  ?[facility], [address], [place], [place] [address] ?     Provider Number: ?[id number]  ?Attending Physician Name and Address:  ?Cipriano BunkerKumar, Pardeep, MD ? Relative Name and Phone Number:  ?Dicky DoeLeDonna Enneking- daughter - [phone] ?   ?Current Level of Care: ?Hospital Recommended Level of Care: ?Skilled Nursing Facility Prior Approval Number: ?  ? ?Date Approved/Denied: ?  PASRR Number: ?  ? ?Discharge Plan: ?SNF ?  ? ?Current Diagnoses: ?Patient Active Problem List  ? Diagnosis Date Noted  ? Complicated UTI (urinary tract infection) 10/30/2021  ? SIRS (systemic inflammatory response syndrome), possible sepsis(HCC)   ? 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 Sepsis (HCC) 12/23/2020  ? Neurogenic bladder   ? Syncope 10/19/2020  ? AKI (acute kidney injury) (HCC) 10/19/2020  ? Bilateral hydronephrosis 10/19/2020  ? HTN (hypertension) 10/19/2020  ? HLD (hyperlipidemia) 10/19/2020  ? Depression with anxiety 10/19/2020  ? Hypotension 09/2020  ? Cervical spondylosis with myelopathy and radiculopathy 09/2020  ? Spinal cord infarction [facility](HCC) 09/2020  ? ? ?Orientation RESPIRATION BLADDER Height & Weight   ?  ?Self, Time, Situation, Place ? Normal Indwelling catheter Weight: 120.7 kg ?Height:     ?BEHAVIORAL SYMPTOMS/MOOD NEUROLOGICAL BOWEL NUTRITION STATUS  ?    Incontinent Diet (see discharge summary)  ?AMBULATORY STATUS COMMUNICATION OF NEEDS Skin   ?Total Care Verbally Normal ?  ?  ?  ?    ?     ?     ? ? ?Personal Care Assistance Level of Assistance  ?Total care   ?  ?  ?Total Care Assistance: Maximum assistance  ? ?Functional Limitations Info  ?    ?  ?   ? ? ?SPECIAL CARE  FACTORS FREQUENCY  ?    ?  ?  ?  ?  ?  ?  ?   ? ? ?Contractures Contractures Info: Not present  ? ? ?Additional Factors Info  ?Code Status, Allergies Code Status Info: Full ?Allergies Info: Melatonin, neomycin, sulfa ?  ?  ?  ?   ? ?Current Medications (10/30/2021):  This is the current hospital active medication list ?Current Facility-Administered Medications  ?Medication Dose Route Frequency Provider Last Rate Last Admin  ? acetaminophen (TYLENOL) tablet 650 mg  650 mg Oral Q6H PRN Andris Baumannuncan, Hazel V, MD      ? Or  ? acetaminophen (TYLENOL) suppository 650 mg  650 mg Rectal Q6H PRN Andris Baumannuncan, Hazel V, MD      ? ceFEPIme (MAXIPIME) 2 g in sodium chloride 0.9 % 100 mL IVPB  2 g Intravenous Q12H Andris Baumannuncan, Hazel V, MD      ? DULoxetine (CYMBALTA) DR capsule 60 mg  60 mg Oral Daily Andris Baumannuncan, Hazel V, MD   60 mg at 10/30/21 0934  ? enoxaparin (LOVENOX) injection 60 mg  0.5 mg/kg Subcutaneous Q24H Andris Baumannuncan, Hazel V, MD   60 mg at 10/30/21 0934  ? lactated ringers infusion   Intravenous Continuous Andris Baumannuncan, Hazel V, MD 150 mL/hr at 10/30/21 0543 New Bag at 10/30/21 0543  ? ondansetron (ZOFRAN) tablet 4 mg  4 mg Oral Q6H PRN Andris Baumannuncan, Hazel V, MD      ? Or  ? ondansetron [facility](ZOFRAN)  injection 4 mg  4 mg Intravenous Q6H PRN Andris Baumann, MD      ? traZODone (DESYREL) tablet 50 mg  50 mg Oral QHS Andris Baumann, MD      ? ?Current Outpatient Medications  ?Medication Sig Dispense Refill  ? acamprosate (CAMPRAL) 333 MG tablet Take 333 mg by mouth 3 (three) times daily.    ? Acetaminophen 500 MG capsule Take 1,000 mg by mouth every 6 (six) hours as needed.    ? aspirin 81 MG chewable tablet Chew 81 mg by mouth daily.    ? atorvastatin (LIPITOR) 10 MG tablet Take 10 mg by mouth daily.    ? bisacodyl (DULCOLAX) 10 MG suppository Place 1 suppository (10 mg total) rectally daily as needed for moderate constipation. 12 suppository 0  ? cefTRIAXone (ROCEPHIN) 1 g injection Inject 1 g into the muscle daily.    ? DULoxetine (CYMBALTA) 30 MG capsule  Take 30 mg by mouth daily.    ? DULoxetine (CYMBALTA) 60 MG capsule Take 60 mg by mouth daily.    ? ELIQUIS 5 MG TABS tablet Take 5 mg by mouth every other day.    ? Emollient [facility] OINT Apply 1 application. topically daily. To feet.    ? Ergocalciferol 50 MCG (2000 UT) TABS Take 2,000 Units by mouth daily.    ? fluticasone (FLONASE) 50 MCG/ACT nasal spray Place 1 spray into both nostrils daily.    ? ipratropium-albuterol (DUONEB) 0.5-2.5 (3) MG/3ML SOLN Take 3 mLs by nebulization every 4 (four) hours as needed.    ? lidocaine (LIDODERM) 5 % Place 1 patch onto the skin daily. Remove & Discard patch within 12 hours or as directed by MD 30 patch 0  ? loratadine (CLARITIN) 10 MG tablet Take 10 mg by mouth daily.    ? Multiple Vitamin (MULTIVITAMIN) tablet Take 1 tablet by mouth daily.    ? nystatin (MYCOSTATIN/NYSTOP) powder Apply topically 2 (two) times daily. 15 g 0  ? omeprazole (PRILOSEC) 20 MG capsule Take 20 mg by mouth daily.    ? ondansetron (ZOFRAN) 4 MG tablet Take 4 mg by mouth every 6 (six) hours as needed for nausea.    ? oxyCODONE (OXY IR/ROXICODONE) 5 MG immediate release tablet Take 5 mg by mouth every 4 (four) hours as needed for severe pain.    ? Polyethylene Glycol 400 1 % SOLN Place 2 drops into both eyes daily.    ? pregabalin (LYRICA) 150 MG capsule Take 150 mg by mouth 2 (two) times daily.    ? senna-docusate (SENOKOT-S) 8.6-50 MG tablet Take 1 tablet by mouth daily.    ? tiZANidine (ZANAFLEX) 2 MG tablet Take 2 mg by mouth 2 (two) times daily.    ? traZODone (DESYREL) 50 MG tablet Take 50 mg by mouth at bedtime.    ? amLODipine (NORVASC) 5 MG tablet Take 5 mg by mouth daily. (Patient not taking: Reported on 10/30/2021)    ? celecoxib (CELEBREX) 100 MG capsule Celebrex 100 mg capsule (Patient not taking: Reported on 10/30/2021)    ? dicyclomine (BENTYL) 10 MG capsule Take 10 mg by mouth 2 (two) times daily.    ? guaiFENesin-codeine (CHERATUSSIN AC) 100-10 MG/5ML syrup Cheratussin AC 10 mg-100  mg/5 mL oral liquid (Patient not taking: Reported on 10/30/2021)    ? losartan (COZAAR) 100 MG tablet Take 100 mg by mouth daily. (Patient not taking: Reported on 10/30/2021)    ? methocarbamol (ROBAXIN) 500 MG tablet Take 1  tablet (500 mg total) by mouth every 6 (six) hours as needed for muscle spasms. (Patient not taking: Reported on 10/30/2021)    ? omeprazole (PRILOSEC) 40 MG capsule Take 1 capsule by mouth 2 (two) times daily. (Patient not taking: Reported on 10/30/2021)    ? polyethylene glycol (MIRALAX / GLYCOLAX) 17 g packet Take 17 g by mouth daily. (Patient not taking: Reported on 10/30/2021)    ? pregabalin (LYRICA) 100 MG capsule Take 1 capsule by mouth 2 (two) times daily. (Patient not taking: Reported on 10/30/2021)    ? vitamin C (ASCORBIC ACID) 500 MG tablet Take 500 mg by mouth 2 (two) times daily. (Patient not taking: Reported on 10/30/2021)    ? ? ? ?Discharge Medications: ?Please see discharge summary for a list of discharge medications. ? ?Relevant Imaging Results: ? ?Relevant Lab Results: ? ? ?Additional Information ?SS# [id number] ? ?Allayne Butcher, RN ? ? ? ? ?

## 2021-10-30 NOTE — Assessment & Plan Note (Signed)
Continue duloxetine and trazodone 

## 2021-10-30 NOTE — Progress Notes (Signed)
PHARMACY -  BRIEF ANTIBIOTIC NOTE  ? ?Pharmacy has received consult(s) for Cefepime from an ED provider.  The patient's profile has been reviewed for ht/wt/allergies/indication/available labs.   ? ?One time order(s) placed for Cefepime 2gm ? ?Further antibiotics/pharmacy consults should be ordered by admitting physician if indicated.       ?                ?Thank you, ?Ajamu Maxon Rodriguez-Guzman PharmD, BCPS ?10/30/2021 3:03 AM ? ?

## 2021-10-30 NOTE — H&P (Addendum)
?History and Physical  ? ? ?Patient: Wendy Nelson VQQ:[id number] DOB: 09/01/1965 ?DOA: 10/30/2021 ?DOS: the patient was seen and examined on 10/30/2021 ?[facility]: [facility]  ?Patient coming from: Home ? ?Chief Complaint:  ?Chief Complaint  ?Patient presents with  ? Weakness  ? ? ?HPI: Wendy Nelson is a 56 y.o. 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  Patient was started started on Rocephin on 4/15 for possible UTI .  On arrival of EMS systolic blood pressure reportedly in the 60s for which an IV fluid bolus of 200 mils was administered. ?ED course and data reviewed.  On arrival temperature 99.6 with pulse of 94.  BP initially 156/107 dropping to as low as 82/65 but fluid responsive to 107/69.  O2 sat at 1 point dropped to 84% on room air requiring 2 L to maintain sats in the mid 90s. ?Blood work significant for WBC of 10.4 with lactic acid 1.3 and procalcitonin 0.15.  Hemoglobin 10.3.  Creatinine within normal limits.  Urinalysis consistent with UTI.  EKG, personally viewed and interpreted shows sinus rhythm at 89 with nonspecific ST-T wave changes.  Chest x-ray showed no active disease. ?Patient given a IV fluid bolus and started on cefepime for possible sepsis.  Hospitalist consulted for admission.  ?Review of Systems: As mentioned in the history of present illness. All other systems reviewed and are negative. ?Past Medical History:  ?Diagnosis Date  ? Quadriplegia (HCC)   ? Stroke [facility]) 09/2020  ? ?Past Surgical History:  ?Procedure Laterality Date  ? CERVICAL SPINE SURGERY    ? JOINT  REPLACEMENT Right   ? Knee  ? ?Social History:  reports that she has never smoked. She has never used smokeless tobacco. No history on file for alcohol use and drug use. ? ?Allergies  ?Allergen Reactions  ? Melatonin   ? Neomycin Other (See Comments)  ?  Positive patch test  ? Sulfa Antibiotics   ? ? ?Family History  ?Problem Relation Age of Onset  ? Diabetes Mellitus II Mother   ? Hypertension Mother   ? Diabetes Mellitus II Brother   ? ? ?Prior to Admission medications   ?Medication Sig Start Date End Date Taking? Authorizing Provider  ?acamprosate (CAMPRAL) 333 MG tablet Take 333 mg by mouth 3 (three) times daily. 09/05/20   [provider]  ?Acetaminophen 500 MG capsule Take 1,000 mg by mouth every 6 (six) hours as needed. 10/12/20   [provider]  ?amLODipine (NORVASC) 5 MG tablet Take 5 mg by mouth daily. 08/30/21   [provider]  ?atorvastatin (LIPITOR) 10 MG tablet Take 10 mg by mouth daily.    [provider]  ?bisacodyl (DULCOLAX) 10 MG suppository Place 1 suppository (10 mg total) rectally daily as needed for moderate constipation. 01/02/21   Joseph Art, DO  ?celecoxib (CELEBREX) 100 MG capsule Celebrex 100 mg capsule 03/27/17   [provider]  ?dicyclomine (BENTYL) 10 MG capsule Take 10 mg by mouth 3 (three) times daily with meals. 08/04/20 08/04/21  [provider]  ?DULoxetine (CYMBALTA) 60 MG capsule Take 60 mg by mouth daily. 09/11/21   [provider]  ?  ELIQUIS 5 MG TABS tablet Take by mouth. 09/22/21   [provider]  ?guaiFENesin-codeine (CHERATUSSIN AC) 100-10 MG/5ML syrup Cheratussin AC 10 mg-100 mg/5 mL oral liquid    [provider]  ?lidocaine (LIDODERM) 5 % Place 1 patch onto the skin daily. Remove & Discard patch within 12 hours or as directed by MD 10/21/20   Enedina FinnerPatel, Sona, MD  ?losartan (COZAAR) 100 MG tablet Take 100 mg by mouth daily. 10/03/21   [provider]  ?methocarbamol (ROBAXIN) 500 MG tablet  Take 1 tablet (500 mg total) by mouth every 6 (six) hours as needed for muscle spasms. 01/02/21   Joseph ArtVann, Jessica U, DO  ?Multiple Vitamin (MULTIVITAMIN) tablet Take 1 tablet by mouth daily.    [provider]  ?nystatin (MYCOSTATIN/NYSTOP) powder Apply topically 2 (two) times daily. 01/02/21   Joseph ArtVann, Jessica U, DO  ?omeprazole (PRILOSEC) 40 MG capsule Take 1 capsule by mouth 2 (two) times daily. 12/11/20   [provider]  ?polyethylene glycol (MIRALAX / GLYCOLAX) 17 g packet Take 17 g by mouth daily.    [provider]  ?pregabalin (LYRICA) 100 MG capsule Take 1 capsule by mouth 2 (two) times daily. 10/12/20   [provider]  ?pregabalin (LYRICA) 150 MG capsule Take 150 mg by mouth 2 (two) times daily. 09/25/21   [provider]  ?senna-docusate (SENOKOT-S) 8.6-50 MG tablet Take 1 tablet by mouth daily.    [provider]  ?tiZANidine (ZANAFLEX) 2 MG tablet Take 2 mg by mouth 2 (two) times daily. 09/14/20   [provider]  ?traZODone (DESYREL) 50 MG tablet Take 50 mg by mouth at bedtime. 09/11/21   [provider]  ?vitamin C (ASCORBIC ACID) 500 MG tablet Take 500 mg by mouth 2 (two) times daily.    [provider]  ?hydrochlorothiazide (HYDRODIURIL) 25 MG tablet Take 1 tablet by mouth daily. ?Patient not taking: Reported on 12/23/2020 12/14/16 12/24/20  [provider]  ? ? ?Physical Exam: ?Vitals:  ? 10/30/21 0113 10/30/21 0130 10/30/21 0200 10/30/21 0228  ?BP: (!) 87/70 94/78 (!) 82/65 107/69  ?Pulse:  79 81 81  ?Resp: (!) 21 19 20  (!) 22  ?Temp:      ?TempSrc:      ?SpO2:  99% 95% 97%  ? ?Physical Exam ?Vitals and nursing note reviewed.  ?Constitutional:   ?   General: She is not in acute distress. ?HENT:  ?   Head: Normocephalic and atraumatic.  ?Cardiovascular:  ?   Rate and Rhythm: Normal rate and regular rhythm.  ?   Pulses: Normal pulses.  ?   Heart sounds: Normal heart sounds.  ?Pulmonary:  ?   Effort: Pulmonary effort is  normal.  ?   Breath sounds: Normal breath sounds.  ?Abdominal:  ?   Palpations: Abdomen is soft.  ?   Tenderness: There is no abdominal tenderness.  ?Genitourinary: ?   Comments: Foley ?Musculoskeletal:  ?   Comments: Disuse atrophy of extremities  ?Neurological:  ?   Mental Status: Mental status is at baseline.  ? ? ? ?Data Reviewed: ?Relevant notes from primary care and specialist visits, past discharge summaries as available in EHR, including Care Everywhere. ?Prior diagnostic testing as pertinent to current admission diagnoses ?Updated medications and problem lists for reconciliation ?ED course, including vitals, labs, imaging, treatment and response to treatment ?Triage notes, nursing and pharmacy notes and ED provider's notes ?Notable results as noted in HPI ? ? ?Assessment and Plan: ?* Complicated  UTI (urinary tract infection) ?Neurogenic bladder with history of bilateral hydronephrosis and pyelonephrosis who underwent cystoscopy 3/21 ?Continue cefepime ?Exchange Foley  ?Follow cultures ?Continue IV hydration to treat suspected sepsis ? ?SIRS (systemic inflammatory response syndrome), possible sepsis(HCC) ?Sepsis suspected ?Continue sepsis fluids and cefepime ?Treat UTI as outlined above ? ?Hypotension ?Concern for sepsis though with normal WBC, lactic acid procalcitonin which could be related to prior antibiotics administered ?IV hydration and close monitoring of blood pressure ?Hold home antihypertensives ? 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  Decubitus precautions ? ?Neurogenic bladder ?Secondary to spinal cord infarct.  Continue Foley ? ?Depression with anxiety ?Continue duloxetine and trazodone ? ?HTN (hypertension) ?Holding home antihypertensives due to hypotension ? ? ? ? ? ? ?Advance Care Planning:   Code Status: Prior  ? ?Consults: none ? ?Family Communication:  none ? ?Severity of Illness: ?The appropriate patient status for this patient is INPATIENT. Inpatient status is judged to be reasonable and necessary in order to provide the required intensity of service to ensure the patient

## 2021-10-30 NOTE — ED Provider Notes (Signed)
? ?[facility] ?Provider Note ? ? ? Event Date/Time  ? First MD Initiated Contact with Patient 10/30/21 0037   ?  (approximate) ? ? ?History  ? ?Weakness ? ?Level 5 caveat:  history/ROS limited by acute/critical illness ? ?HPI ? ?Wendy Nelson is a 56 y.o. female whose medical history includes quadriplegia (C7 incomplete secondary to a spinal cord infarction), neurogenic bladder, and prior episodes of sepsis and hypotension.  She presents by EMS for evaluation [facility] for hypotension and concern for sepsis.  The patient said that she has felt "out of it" recently, particular over the last couple of days.  She was reportedly diagnosed with a UTI and given an injection of ceftriaxone yesterday.  However she seemed to get worse clinically by tonight. ? ?There was a report of some respiratory difficulties but the patient says she does not feel short of breath.  She has had no fever of which she is aware.  She denies chest pain and abdominal pain. ?  ? ? ?Physical Exam  ? ?Triage Vital Signs: ?ED Triage Vitals  ?Enc Vitals Group  ?   BP 10/30/21 0037 (!) 156/107  ?   Pulse Rate 10/30/21 0033 94  ?   Resp 10/30/21 0033 18  ?   Temp 10/30/21 0037 99.6 ?F (37.6 ?C)  ?   Temp Source 10/30/21 0037 Rectal  ?   SpO2 10/30/21 0033 93 %  ?   Weight --   ?   Height --   ?   Head Circumference --   ?   [facility] --   ?   Pain Score --   ?   Pain Loc --   ?   Pain Edu? --   ?   Excl. in GC? --   ? ? ?Most recent vital signs: ?Vitals:  ? 10/30/21 0330 10/30/21 0400  ?BP: 124/74 104/79  ?Pulse: 73 77  ?Resp: 16 20  ?Temp:    ?SpO2: 100% 100%  ? ? ? ?General: Awake, appears chronically ill and slightly confused at this time but not in distress. ?CV:  Somewhat clammy extremities.  Normal heart sounds. ?Resp:  Normal effort.  Lungs clear to auscultation although exam is somewhat limited by body habitus. ?Abd:  Morbid obesity.  No tenderness to palpation throughout the abdomen. ?Other:  Patient has some  degree of control and movement over her left arm and hand which she says is at her baseline.  She has a very small amount of mobility of her right hand and arm and no use of her legs.  This is all baseline, confirmed by patient. ? ? ?ED Results / Procedures / Treatments  ? ?Labs ?(all labs ordered are listed, but only abnormal results are displayed) ?Labs Reviewed  ?LACTIC ACID, PLASMA - Abnormal; Notable for the following components:  ?    Result Value  ? Lactic Acid, Venous 3.2 (*)   ? All other components within normal limits  ?COMPREHENSIVE METABOLIC PANEL - Abnormal; Notable for the following components:  ? Glucose, Bld 112 (*)   ? Calcium 8.2 (*)   ? Albumin 2.7 (*)   ? Alkaline Phosphatase 152 (*)   ? All other components within normal limits  ?CBC WITH DIFFERENTIAL/PLATELET - Abnormal; Notable for the following components:  ? RBC 3.84 (*)   ? Hemoglobin 10.3 (*)   ? HCT 32.6 (*)   ? All other components within normal limits  ?APTT - Abnormal; Notable for  the following components:  ? aPTT 20 (*)   ? All other components within normal limits  ?URINALYSIS, COMPLETE (UACMP) WITH MICROSCOPIC - Abnormal; Notable for the following components:  ? Color, Urine AMBER (*)   ? APPearance TURBID (*)   ? Hgb urine dipstick SMALL (*)   ? Protein, ur 100 (*)   ? Leukocytes,Ua LARGE (*)   ? RBC / HPF >50 (*)   ? WBC, UA >50 (*)   ? Bacteria, UA MANY (*)   ? All other components within normal limits  ?CULTURE, BLOOD (ROUTINE X 2)  ?RESP PANEL BY RT-PCR (FLU A&B, COVID) [id number]  ?CULTURE, BLOOD (ROUTINE X 2)  ?URINE CULTURE  ?LACTIC ACID, PLASMA  ?PROTIME-INR  ?LIPASE, BLOOD  ?PROCALCITONIN  ?HIV ANTIBODY (ROUTINE TESTING W REFLEX)  ?CORTISOL-AM, BLOOD  ?TROPONIN I (HIGH SENSITIVITY)  ?TROPONIN I (HIGH SENSITIVITY)  ? ? ? ?EKG ? ?ED ECG REPORT ?ILoleta Rose, the attending physician, personally viewed and interpreted this ECG. ? ?Date: 10/30/2021 ?EKG Time: 00: 51 ?Rate: 89 ?Rhythm: normal sinus rhythm ?QRS Axis:  normal ?Intervals: normal ?ST/T Wave abnormalities: normal ?Narrative Interpretation: no evidence of acute ischemia ? ? ? ?RADIOLOGY ?I personally reviewed the patient's portable chest x-ray and I see no evidence of acute infiltrate nor pulmonary edema. ? ? ? ?PROCEDURES: ? ?Critical Care performed: Yes, see critical care procedure note(s) ? ?.1-3 Lead EKG Interpretation ?Performed by: Loleta Rose, MD ?Authorized by: Loleta Rose, MD  ? ?  Interpretation: normal   ?  ECG rate:  77 ?  ECG rate assessment: normal   ?  Rhythm: sinus rhythm   ?  Ectopy: none   ?  Conduction: normal   ?.Critical Care ?Performed by: Loleta Rose, MD ?Authorized by: Loleta Rose, MD  ? ?Critical care provider statement:  ?  Critical care time (minutes):  30 ?  Critical care time was exclusive of:  Separately billable procedures and treating other patients ?  Critical care was necessary to treat or prevent imminent or life-threatening deterioration of the following conditions:  Sepsis ?  Critical care was time spent personally by me on the following activities:  Development of treatment plan with patient or surrogate, evaluation of patient's response to treatment, examination of patient, obtaining history from patient or surrogate, ordering and performing treatments and interventions, ordering and review of laboratory studies, ordering and review of radiographic studies, pulse oximetry, re-evaluation of patient's condition and review of old charts ? ? ?MEDICATIONS ORDERED IN ED: ?Medications  ?DULoxetine (CYMBALTA) DR capsule 60 mg (has no administration in time range)  ?traZODone (DESYREL) tablet 50 mg (has no administration in time range)  ?lactated ringers infusion (has no administration in time range)  ?acetaminophen (TYLENOL) tablet 650 mg (has no administration in time range)  ?  Or  ?acetaminophen (TYLENOL) suppository 650 mg (has no administration in time range)  ?ondansetron (ZOFRAN) tablet 4 mg (has no administration in time  range)  ?  Or  ?ondansetron (ZOFRAN) injection 4 mg (has no administration in time range)  ?enoxaparin (LOVENOX) injection 60 mg (has no administration in time range)  ?ceFEPIme (MAXIPIME) 2 g in sodium chloride 0.9 % 100 mL IVPB (has no administration in time range)  ?lactated ringers bolus 1,000 mL (0 mLs Intravenous Stopped 10/30/21 0456)  ?lactated ringers bolus 1,000 mL (0 mLs Intravenous Stopped 10/30/21 0456)  ?ceFEPIme (MAXIPIME) 2 g in sodium chloride 0.9 % 100 mL IVPB (2 g Intravenous New Bag/Given 10/30/21 0422)  ? ? ? ?  IMPRESSION / MDM / ASSESSMENT AND PLAN / ED COURSE  ?I reviewed the triage vital signs and the nursing notes. ?             ?               ? ?Differential diagnosis includes, but is not limited to, sepsis, septic shock, complicated urinary tract infection, pneumonia, bacteremia. ? ?Patient is presenting with symptoms consistent with sepsis although she is not tachycardic and she is afebrile.  I initially ordered labs for a "possible sepsis work-up including CBC with differential, comprehensive metabolic panel, lipase, procalcitonin, blood cultures x2, respiratory viral panel, urinalysis. ? ?The results are notable for no leukocytosis, negative respiratory viral panel, grossly positive urinalysis. ? ?Initial lactic acid is within normal limits.  I anticipate admission for probable sepsis.  Patient not requiring respiratory support with no evidence of pneumonia at this time.  No need for intubation but she is at high risk of acute decompensation. ? ?The patient is on the cardiac monitor to evaluate for evidence of arrhythmia and/or significant heart rate changes. ? ? ?Clinical Course as of 10/30/21 0504  ?Mon Oct 30, 2021  ?0228 Consulting hospitalist for admission.  Blood pressure is improved 107/69 but I am giving a second liter of fluids.  Even though her lab work is not consistent with sepsis, she has a slightly elevated procalcitonin and her urinalysis appears grossly infected.  I ordered  cefepime 2 g IV for healthcare associated urinary tract infection, particularly given that she has already received 1 dose of Rocephin as per verbal report. [CF]  ?1779 I discussed the case by phone with Dr. Para March

## 2021-10-30 NOTE — Assessment & Plan Note (Addendum)
Sepsis suspected.  Continue IV cefepime. ?Continue IV fluid as per sepsis protocol. ?Treat UTI as outlined above. ?

## 2021-10-30 NOTE — Progress Notes (Signed)
PHARMACIST - PHYSICIAN COMMUNICATION ? ?CONCERNING:  Enoxaparin (Lovenox) for DVT Prophylaxis  ? ? ?RECOMMENDATION: ?Patient was prescribed enoxaprin 40mg  q24 hours for VTE prophylaxis.  ? Weights  ? 10/30/21 0339  ?Weight: 120.7 kg (266 lb)  ? ? ?Body mass index is 44.26 kg/m?. ? ?Estimated Creatinine Clearance: 104.7 mL/min (by C-G formula based on SCr of 0.79 mg/dL). ? ? ?Based on [facility] policy patient is candidate for enoxaparin 0.5mg /kg TBW SQ every 24 hours based on BMI being >30. ? ?DESCRIPTION: ?Pharmacy has adjusted enoxaparin dose per [facility] policy. ? ?Patient is now receiving enoxaparin 60 mg every 24 hours  ? ? ?Saia Derossett Rodriguez-Guzman PharmD, BCPS ?10/30/2021 3:42 AM ? ? ?

## 2021-10-30 NOTE — Assessment & Plan Note (Addendum)
Increase nursing assistance for transfers Decubitus precautions 

## 2021-10-30 NOTE — Assessment & Plan Note (Addendum)
There is a concern for sepsis, though has normal WBC, lactic acid and procalcitonin which could be related to prior antibiotics administration. ?Continue IV hydration and close monitoring of blood pressure. ?Hold home antihypertensives.  Blood pressure has now improved. ?

## 2021-10-30 NOTE — ED Notes (Signed)
LR bolus paused for cefepime administration. ?

## 2021-10-30 NOTE — TOC Initial Note (Signed)
Transition of Care (TOC) - Initial/Assessment Note  ? ? ?Patient Details  ?Name: Wendy Nelson ?MRN: [id number] ?Date of Birth: 1965/11/23 ? ?Transition of Care (TOC) CM/SW Contact:    ?Allayne Butcher, RN ?Phone Number: ?10/30/2021, 9:57 AM ? ?Clinical Narrative:                 ?Patient admitted to the hospital with Sepsis, complicated UTI.  Patient is from [facility] long term care there.  Plan for discharge will be for [facility] to return to [facility].   ? ?Expected Discharge Plan: Skilled Nursing Facility ?Barriers to Discharge: Continued Medical Work up ? ? ?Patient Goals and CMS Choice ?Patient states their goals for this hospitalization and ongoing recovery are:: Patient long term care at [facility] will return at [place] ?CMS Medicare.gov Compare Post Acute Care list provided to:: Patient ?Choice offered to / list presented to : Patient ? ?Expected Discharge Plan and Services ?Expected Discharge Plan: Skilled Nursing Facility ?  ?Discharge Planning Services: CM Consult ?Post Acute Care Choice: Skilled Nursing Facility, Resumption of Svcs/PTA Provider ?Living arrangements for the past 2 months: Skilled Nursing Facility ?                ?DME Arranged: N/A ?DME Agency: NA ?  ?  ?  ?HH Arranged: NA ?HH Agency: NA ?  ?  ?  ? ?Prior Living Arrangements/Services ?Living arrangements for the past 2 months: Skilled Nursing Facility ?Lives with:: Facility Resident ?Patient language and need for interpreter reviewed:: Yes ?Do you feel safe going back to the place where you live?: Yes      ?Need for Family Participation in Patient Care: Yes (Comment) ?Care giver support system in place?: Yes (comment) ?  ?Criminal Activity/Legal Involvement Pertinent to Current Situation/Hospitalization: No - Comment as needed ? ?Activities of Daily Living ?Home Assistive Devices/Equipment: Nurse, adult, Wheelchair, Eyeglasses ?ADL Screening (condition at time of admission) ?Patient's cognitive ability adequate to safely complete daily activities?: Yes ?Is  the patient deaf or have difficulty hearing?: No ?Does the patient have difficulty seeing, even when wearing glasses/contacts?: No ?Does the patient have difficulty concentrating, remembering, or making decisions?: No ?Patient able to express need for assistance with ADLs?: Yes ?Does the patient have difficulty dressing or bathing?: Yes ?Independently performs ADLs?: No ?Communication: Independent ?Dressing (OT): Needs assistance ?Is this a change from baseline?: Pre-admission baseline ?Grooming: Needs assistance ?Is this a change from baseline?: Pre-admission baseline ?Feeding: Independent ?Bathing: Needs assistance ?Is this a change from baseline?: Pre-admission baseline ?Toileting: Dependent ?Is this a change from baseline?: Pre-admission baseline ?In/Out Bed: Dependent ?Is this a change from baseline?: Pre-admission baseline ?Walks in Home: Dependent ?Is this a change from baseline?: Pre-admission baseline ?Does the patient have difficulty walking or climbing stairs?: Yes ?Weakness of Legs: Both ?Weakness of Arms/Hands: Both ? ?Permission Sought/Granted ?Permission sought to share information with : Case Manager, Family Supports, Magazine features editor ?Permission granted to share information with : Yes, Verbal Permission Granted ? Share Information with NAME: Calyn Sivils ? Permission granted to share info w AGENCY: [facility] ? Permission granted to share info w Relationship: daughter ? Permission granted to share info w Contact Information: [phone] ? ?Emotional Assessment ?  ?  ?  ?Orientation: : Oriented to Self, Oriented to Place, Oriented to  Time, Oriented to Situation ?Alcohol / Substance Use: Not Applicable ?Psych Involvement: No (comment) ? ?Admission diagnosis:  Complicated UTI (urinary tract infection) [N39.0] ?Patient Active Problem List  ? Diagnosis Date Noted  ?  Complicated UTI (urinary tract infection) 10/30/2021  ? SIRS (systemic inflammatory response syndrome), possible sepsis(HCC)   ?  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 Sepsis (HCC) 12/23/2020  ? Neurogenic bladder   ? Syncope 10/19/2020  ? AKI (acute kidney injury) (HCC) 10/19/2020  ? Bilateral hydronephrosis 10/19/2020  ? HTN (hypertension) 10/19/2020  ? HLD (hyperlipidemia) 10/19/2020  ? Depression with anxiety 10/19/2020  ? Hypotension 09/2020  ? Cervical spondylosis with myelopathy and radiculopathy 09/2020  ? Spinal cord infarction [facility]) 09/2020  ? ?PCP:  [facility] ?Pharmacy:   ?[id number] - [place], [place] - [address] ?[address] ?[place] [address] ?Phone: [phone] Fax: [phone] ? ? ? ? ?Social Determinants of Health (SDOH) Interventions ?  ? ?Readmission Risk Interventions ?   ? View : No data to display.  ?  ?  ?  ? ? ? ?

## 2021-10-30 NOTE — Assessment & Plan Note (Addendum)
Sec. to spinal cord infarct.  Continue Foley ?

## 2021-10-30 NOTE — ED Triage Notes (Signed)
Pt came in via [facility] with c/o weakness. According to EMS, pt was diagnosed with a UTI yesterday and given a shot of Rocephin. Pt has congested cough and duoneb was administered. Pt sats 94-96% on RA. Pt occasionally tachypneic at 25-27 RR. EMS states that on arrival, pt's pressures were in the 60s systolic. of NS administered ?

## 2021-10-30 NOTE — Assessment & Plan Note (Addendum)
Patient does have neurogenic bladder with history of bilateral hydronephrosis and pyelonephrosis. ?She underwent cystoscopy on 10/03/21. ?Continue empiric antibiotics cefepime ?Follow-up urine cultures.  Continue IV hydration to treat suspected sepsis. ? ? ?

## 2021-10-31 DIAGNOSIS — E872 Acidosis, unspecified: Secondary | ICD-10-CM

## 2021-10-31 DIAGNOSIS — N390 Urinary tract infection, site not specified: Secondary | ICD-10-CM | POA: Diagnosis not present

## 2021-10-31 LAB — LACTIC ACID, PLASMA
Lactic Acid, Venous: 3.4 mmol/L (ref 0.5–1.9)
Lactic Acid, Venous: 3 mmol/L (ref 0.5–1.9)

## 2021-10-31 MED ORDER — SODIUM CHLORIDE 0.9 % IV BOLUS
500.0000 mL | Freq: Once | INTRAVENOUS | Status: AC
  Administered 2021-10-31: 500 mL via INTRAVENOUS

## 2021-10-31 MED ORDER — SODIUM CHLORIDE 0.9 % IV SOLN: INTRAVENOUS | Status: DC

## 2021-10-31 MED ORDER — POLYVINYL ALCOHOL 1.4 % OP SOLN
1.0000 [drp] | OPHTHALMIC | Status: DC | PRN
  Administered 2021-11-02: 1 [drp] via OPHTHALMIC
  Filled 2021-10-31 (×2): qty 15

## 2021-10-31 NOTE — Hospital Course (Signed)
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  Patient was hypotensive on arrival, blood pressure improved with IV fluid bolus.  Patient admitted for complicated UTI started on IV antibiotics blood and urine cultures were sent.   ?

## 2021-10-31 NOTE — Assessment & Plan Note (Signed)
It could be secondary to suspected sepsis/complicated UTI. ?Lactic acid normal on admission but now is trending up. ?Continue IV hydration and continue to monitor lactic acid ?

## 2021-10-31 NOTE — Progress Notes (Signed)
?  Progress Note ? ? ?Patient: Wendy Nelson [id number] DOB: Nov 17, 1965 DOA: 10/30/2021     1 ? ?DOS: the patient was seen and examined on 10/31/2021 ?  ?Brief hospital course: ?This 56 years old female with PMH significant for CAD, hyperlipidemia, GERD, C7 quadriplegia from spinal cord infarct as a complication from posterior cervical laminectomy on 09/22/2021 with resulting neurogenic bladder with indwelling Foley catheter currently also followed by urology for hydronephrosis and pyelonephrosis who was sent from rehab facility for evaluation of weakness, hypotension and tachypnea.  Patient was hypotensive on arrival, blood pressure improved with IV fluid bolus.  Patient admitted for complicated UTI,  started on IV antibiotics. blood and urine cultures were sent.   ? ?Assessment and Plan: ?* Complicated UTI (urinary tract infection) ?Patient does have neurogenic bladder with history of bilateral hydronephrosis and pyelonephrosis. ?She underwent cystoscopy on 10/03/21. ?Continue empiric antibiotics cefepime ?Follow-up urine cultures.  Continue IV hydration to treat suspected sepsis. ? ? ? ?SIRS (systemic inflammatory response syndrome), possible sepsis(HCC) ?Sepsis suspected.  Continue IV cefepime. ?Continue IV fluid as per sepsis protocol. ?Treat UTI as outlined above. ? ?Hypotension ?There is a concern for sepsis, though has normal WBC, lactic acid and procalcitonin which could be related to prior antibiotics administration. ?Continue IV hydration and close monitoring of blood pressure. ?Hold home antihypertensives.  Blood pressure has now improved. ? 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  Decubitus precautions. ? ?Neurogenic bladder ?Sec. to spinal cord infarct.  Continue Foley ? ?Lactic acidosis ?It could be secondary to suspected sepsis/complicated UTI. ?Lactic acid normal on admission but now  is trending up. ?Continue IV hydration and continue to monitor lactic acid ? ?Depression with anxiety ?Continue duloxetine and trazodone ? ?HTN (hypertension) ?Holding home antihypertensives due to hypotension ? ? ?Subjective: Patient was seen and examined at bedside.  Overnight events noted. ?Patient denies any pain, remains afebrile. ? ?Physical Exam: ?Vitals:  ? 10/30/21 1455 10/30/21 2046 10/31/21 0747 10/31/21 1221  ?BP: (!) 136/95 128/87 97/80 (!) 140/98  ?Pulse: 70 76 81 81  ?Resp: 19 18 18 17   ?Temp: 98.5 ?F (36.9 ?C) 98.1 ?F (36.7 ?C) (!) 97.5 ?F (36.4 ?C) 97.7 ?F (36.5 ?C)  ?TempSrc: Oral Oral Oral Oral  ?SpO2: 96%  100% 100%  ?Weight:      ? ?General exam: Appears comfortable, not in any acute distress.  Deconditioned. ?Respiratory system: CTA bilaterally, no wheezing, no crackles, normal respiratory effort. ?Cardiovascular system: S1-S2 heard, regular rate and rhythm, no murmur. ?Gastrointestinal system: Abdomen is soft, nontender, nondistended, BS+ ?Central nervous system: Alert, oriented x 3, unable to move lower extremities ?Extremities: No edema, no cyanosis, disuse atrophy of lower extremity ?Psychiatry: Mood, appropriate. ? ? ?Data Reviewed: ?I have Reviewed nursing notes, Vitals, and Lab results since pt's last encounter. Pertinent lab results CBC, BMP, UA ?I have ordered test including CBC, BMP ?I have independently visualized and interpreted imaging chest x-ray which showed no acute abnormality. ?I have reviewed the last note from hospitalist,  ?I have discussed pt's care plan and test results with patient.  ? ?Family Communication: No family at bedside ? ?Disposition: ?Status is: Inpatient ?Remains inpatient appropriate because: Admitted for complicated UTI requiring IV antibiotics.  Follow-up urine cultures blood cultures. ? ? Planned Discharge Destination: Skilled nursing facility ? ? ? ?Time spent: 50 minutes ? ?Author: ?Shawna Clamp, MD ?10/31/2021 1:38 PM ? ?For on call review  [email or web address].  ?

## 2021-11-01 DIAGNOSIS — N390 Urinary tract infection, site not specified: Secondary | ICD-10-CM | POA: Diagnosis not present

## 2021-11-01 LAB — CBC
HCT: 35.3 % — ABNORMAL LOW (ref 36.0–46.0)
Hemoglobin: 11.2 g/dL — ABNORMAL LOW (ref 12.0–15.0)
MCH: 26.4 pg (ref 26.0–34.0)
MCHC: 31.7 g/dL (ref 30.0–36.0)
MCV: 83.3 fL (ref 80.0–100.0)
Platelets: 460 10*3/uL — ABNORMAL HIGH (ref 150–400)
RBC: 4.24 MIL/uL (ref 3.87–5.11)
RDW: 14.9 % (ref 11.5–15.5)
WBC: 7.6 10*3/uL (ref 4.0–10.5)
nRBC: 0 % (ref 0.0–0.2)

## 2021-11-01 LAB — LACTIC ACID, PLASMA
Lactic Acid, Venous: 1 mmol/L (ref 0.5–1.9)
Lactic Acid, Venous: 2.2 mmol/L (ref 0.5–1.9)

## 2021-11-01 MED ORDER — SODIUM CHLORIDE 0.9 % IV SOLN
2.0000 g | Freq: Three times a day (TID) | INTRAVENOUS | Status: DC
  Filled 2021-11-01 (×2): qty 12.5

## 2021-11-01 MED ORDER — DULOXETINE HCL 30 MG PO CPEP
90.0000 mg | ORAL_CAPSULE | Freq: Every day | ORAL | Status: DC
  Administered 2021-11-02 – 2021-11-03 (×2): 90 mg via ORAL
  Filled 2021-11-01 (×2): qty 3

## 2021-11-01 MED ORDER — CHLORHEXIDINE GLUCONATE CLOTH 2 % EX PADS
6.0000 | MEDICATED_PAD | Freq: Every day | CUTANEOUS | Status: DC
  Administered 2021-11-01 – 2021-11-03 (×3): 6 via TOPICAL

## 2021-11-01 MED ORDER — SODIUM CHLORIDE 0.9 % IV SOLN
3.0000 g | Freq: Four times a day (QID) | INTRAVENOUS | Status: DC
  Administered 2021-11-01 – 2021-11-02 (×4): 3 g via INTRAVENOUS
  Filled 2021-11-01: qty 3
  Filled 2021-11-01: qty 8
  Filled 2021-11-01 (×3): qty 3
  Filled 2021-11-01: qty 8

## 2021-11-01 MED ORDER — SODIUM CHLORIDE 0.9 % IV SOLN: INTRAVENOUS | Status: DC

## 2021-11-01 NOTE — TOC Progression Note (Signed)
Transition of Care (TOC) - Progression Note  ? ? ?Patient Details  ?Name: Wendy Nelson ?MRN: [id number] ?Date of Birth: 1965/08/16 ? ?Transition of Care (TOC) CM/SW Contact  ?Caryn Section, RN ?Phone Number: ?11/01/2021, 2:39 PM ? ?Clinical Narrative:  Patient is from [facility].  Anticipated DC 2 days.  Tammy at [facility] aware.  ? ? ? ?Expected Discharge Plan: Skilled Nursing Facility ?Barriers to Discharge: Continued Medical Work up ? ?Expected Discharge Plan and Services ?Expected Discharge Plan: Skilled Nursing Facility ?  ?Discharge Planning Services: CM Consult ?Post Acute Care Choice: Skilled Nursing Facility, Resumption of Svcs/PTA Provider ?Living arrangements for the past 2 months: Skilled Nursing Facility ?                ?DME Arranged: N/A ?DME Agency: NA ?  ?  ?  ?HH Arranged: NA ?HH Agency: NA ?  ?  ?  ? ? ?Social Determinants of Health (SDOH) Interventions ?  ? ?Readmission Risk Interventions ?   ? View : No data to display.  ?  ?  ?  ? ? ?

## 2021-11-01 NOTE — TOC Progression Note (Signed)
Transition of Care (TOC) - Progression Note  ? ? ?Patient Details  ?Name: Wendy Nelson ?MRN: [id number] ?Date of Birth: [birth year] ? ?Transition of Care (TOC) CM/SW Contact  ?Pete Pelt, RN ?Phone Number: ?11/01/2021, 4:07 PM ? ?Clinical Narrative:   Mother states she would like to extend search beyond [facility]  Bed search sent for [facility] as per mother.   ? ? ? ?Expected Discharge Plan: [place] ?Barriers to Discharge: Continued Medical Work up ? ?Expected Discharge Plan and Services ?Expected Discharge Plan: [place] ?  ?Discharge Planning Services: CM Consult ?Post Acute Care Choice: [place], Resumption of Svcs/PTA Provider ?Living arrangements for the past 2 months: [place] ?                ?DME Arranged: N/A ?DME Agency: NA ?  ?  ?  ?HH Arranged: NA ?[place] Agency: NA ?  ?  ?  ? ? ?Social Determinants of Health (SDOH) Interventions ?  ? ?Readmission Risk Interventions ?   ? View : No data to display.  ?  ?  ?  ? ? ?

## 2021-11-01 NOTE — Progress Notes (Addendum)
?PROGRESS NOTE ? ? ? ?Wendy Nelson  XNT:[id number] DOB: 1966/07/01 DOA: 10/30/2021 ?PCP: [facility]  ? ? ?Chief Complaint  ?Patient presents with  ? Weakness  ? ? 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  Patient was hypotensive on arrival, blood pressure improved with IV fluid bolus.  Patient admitted for complicated UTI,  started on IV antibiotics. Blood and urine cultures were sent.   ?  ?Assessment & Plan: ? ?Complicated UTI (urinary tract infection) ?Hypotension ?Neurogenic bladder ?Sepsis (hypotension, increased RR, source) ?Lactic acidosis ?Metabolic encephalopathy ?- Multiple colonies on UC ?- BC remain negative ?- Change ABx to Unasyn, with pharmacy assistance ?- continue to monitor ?- Remains mildly encephalopathic ?- Urine is clear, yellow in foley bag ?- IVF at 50cc/hr, lactic acid up from 1 to 2.2, will increase to 100cc/hr for 12 hours.  ? ?HTN (hypertension) ?- Holding home antihypertensives at this time ?- BP is improved, however, will need further recovery time ?   ?Depression with anxiety ?- continue home duloxetine and trazodone ?   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 ? ? ?DVT prophylaxis: Lovenox ?Code Status: Full ?Family Communication: Spoke with her mother, patient's encephalopathy is a change from baseline for Wendy Nelson.  The family is interested in going to  a spinal specific rehab and  feel that her current rehab facility has stopped doing physical therapy for her.  Will ask CM to discuss with family. ?Disposition:  ? ?Status is: Inpatient ?Remains inpatient appropriate because: continued infection, sepsis and continued encephalopathy ?  ?Consultants:  ?None ? ?Antimicrobials:  ?Cefepime 4/17 --> 4/19 ?Unasyn 4/19 -->   ? ? ?Subjective: ?Slow to speak, but reports doing well.  She is confused with questioning.  Requests I call her mother.  ? ?Objective: ?Vitals:  ? 10/31/21 2238 11/01/21 0014 11/01/21 0505 11/01/21 9675  ?BP: 124/83 135/79 123/81 138/60  ?Pulse: 87 78 80 70  ?Resp: 16 18 18 16   ?Temp: 97.8 ?F (36.6 ?C) 98.8 ?F (37.1 ?C) 97.7 ?F (36.5 ?C) 97.8 ?F (36.6 ?C)  ?TempSrc:      ?SpO2: 100% 100% 100% 100%  ?Weight:      ? ? ?Intake/Output Summary (Last 24 hours) at 11/01/2021 0931 ?Last data filed at 10/31/2021 2238 ?Gross per 24 hour  ?Intake 198.81 ml  ?Output 400 ml  ?Net -201.19 ml  ? ?Filed Weights  ? 10/30/21 0339  ?Weight: 120.7 kg  ? ? ?Examination: ? ?General exam: Appears calm and comfortable, deconditioned, slow to speak ?Respiratory system: Clear to auscultation. Respiratory effort normal. No wheezing ?Cardiovascular system: RR, NR, no murmur noted, she has some non pitting edema to legs, up to knees ?Gastrointestinal system: Abdomen is nondistended, soft and non tender, she has normal bowel sounds ?GU: Foley in place draining clear yellow urine ?Central nervous system: Alert, confused, unable to answer orientation questions, slow to speak.  ?Extremities: moves upper extremities spontaneously, unable to move lower extremities, certainly some contractures are starting.  ?Skin: No rashes, lesions or ulcers on exposed  skin ?Psychiatry: Decreased attention, slow to speak ? ? ?Data Reviewed: Personally reviewed ? ?CBC: ?Recent Labs  ?Lab 10/30/21 ?0107 11/01/21 ?3664  ?WBC 10.4 7.6  ?NEUTROABS 6.1  --   ?HGB 10.3* 11.2*  ?HCT 32.6* 35.3*  ?MCV 84.9 83.3  ?PLT 370 460*  ? ? ?Basic  Metabolic Panel: ?Recent Labs  ?Lab 10/30/21 ?0107  ?NA 136  ?K 3.6  ?CL 101  ?CO2 24  ?GLUCOSE 112*  ?BUN 15  ?CREATININE 0.79  ?CALCIUM 8.2*  ? ? ?GFR: ?Estimated Creatinine Clearance: 104.7 mL/min (by C-G formula based on SCr of 0.79 mg/dL). ? ?Liver Function Tests: ?Recent Labs  ?Lab 10/30/21 ?0107  ?AST 33  ?ALT 27  ?ALKPHOS 152*  ?BILITOT 0.8  ?PROT 7.5  ?ALBUMIN 2.7*  ? ? ?CBG: ?No results for input(s): GLUCAP in the last 168 hours. ? ? ?Recent Results (from the past 240 hour(s))  ?Blood Culture (routine x 2)     Status: None (Preliminary result)  ? Collection Time: 10/30/21  1:07 AM  ? Specimen: BLOOD  ?Result Value Ref Range Status  ? Specimen Description BLOOD RIGHT FA  Final  ? Special Requests   Final  ?  BOTTLES DRAWN AEROBIC AND ANAEROBIC Blood Culture results may not be optimal due to an inadequate volume of blood received in culture bottles  ? Culture   Final  ?  NO GROWTH 2 DAYS ?Performed at [facility], [address] [place], [place] [address] ?  ? Report Status PENDING  Incomplete  ?Blood Culture (routine x 2)     Status: None (Preliminary result)  ? Collection Time: 10/30/21  1:07 AM  ? Specimen: BLOOD  ?Result Value Ref Range Status  ? Specimen Description BLOOD RIGHT AC  Final  ? Special Requests   Final  ?  BOTTLES DRAWN AEROBIC ONLY Blood Culture adequate volume  ? Culture   Final  ?  NO GROWTH 2 DAYS ?Performed at [facility], [address], [place], [place] [address] ?  ? Report Status PENDING  Incomplete  ?Urine Culture     Status: None (Preliminary result)  ? Collection Time: 10/30/21  1:07 AM  ? Specimen: In/Out Cath Urine  ?Result Value Ref Range Status  ? Specimen Description   Final  ?  IN/OUT CATH URINE ?Performed at [facility], [address] [place], [place] [address] ?  ? Special Requests   Final  ?  NONE ?Performed at [facility], [address], [place], [place] [address] ?  ? Culture   Final  ?  CULTURE REINCUBATED FOR BETTER  GROWTH ?30,000 COLONIES/mL GRAM NEGATIVE RODS ?  ? Report Status PENDING  Incomplete  ?Resp Panel by RT-PCR (Flu A&B, Covid) Nasopharyngeal Swab     Status: None  ? Collection Time: 10/30/21  1:08 AM  ? Specimen: Nasopharyngeal Swab; Nasopharyngeal(NP) swabs in vial transport medium  ?Result Value Ref Range Status  ? SARS Coronavirus 2 by RT PCR NEGATIVE NEGATIVE Final  ?  Comment: (NOTE) ?SARS-CoV-2 target nucleic acids are NOT DETECTED. ? ?The SARS-CoV-2 RNA is generally detectable in upper respiratory ?specimens during the acute phase of infection. The lowest ?concentration of SARS-CoV-2 viral copies this assay can detect is ?138 copies/mL. A negative result does not preclude SARS-Cov-2 ?infection and should not be used as the sole basis for treatment or ?other patient management decisions. A negative result may occur with  ?improper specimen collection/handling, submission of specimen other ?than nasopharyngeal swab, presence  of viral mutation(s) within the ?areas targeted by this assay, and inadequate number of viral ?copies(<138 copies/mL). A negative result must be combined with ?clinical observations, patient history, and epidemiological ?information. The expected result is Negative. ? ?Fact Sheet for Patients:  ?BloggerCourse.comhttps://www.fda.gov/media/152166/download ? ?Fact Sheet for Healthcare Providers:  ?SeriousBroker.ithttps://www.fda.gov/media/152162/download ? ?This test is no t yet approved or cleared by the [place] FDA and  ?has been authorized for detection and/or diagnosis of SARS-CoV-2 by ?FDA under an Emergency Use Authorization (EUA). This EUA will remain  ?in effect (meaning this test can be used) for the duration of the ?COVID-19 declaration under Section 564(b)(1) of the Act, 21 ?U.S.C.section 360bbb-3(b)(1), unless the authorization is terminated  ?or revoked sooner.  ? ? ?  ? Influenza A by PCR NEGATIVE NEGATIVE Final  ? Influenza B by PCR NEGATIVE NEGATIVE Final  ?  Comment: (NOTE) ?The Xpert Xpress  SARS-CoV-2/FLU/RSV plus assay is intended as an aid ?in the diagnosis of influenza from Nasopharyngeal swab specimens and ?should not be used as a sole basis for treatment. Nasal washings and ?aspirates are unacceptabl

## 2021-11-01 NOTE — Progress Notes (Signed)
Chaplain provided spiritual and emotional support. Along with ministry of presence and prayer.  ?

## 2021-11-02 DIAGNOSIS — N390 Urinary tract infection, site not specified: Secondary | ICD-10-CM | POA: Diagnosis not present

## 2021-11-02 LAB — BASIC METABOLIC PANEL
Anion gap: 7 (ref 5–15)
BUN: 9 mg/dL (ref 6–20)
CO2: 29 mmol/L (ref 22–32)
Calcium: 8.7 mg/dL — ABNORMAL LOW (ref 8.9–10.3)
Chloride: 106 mmol/L (ref 98–111)
Creatinine, Ser: 0.67 mg/dL (ref 0.44–1.00)
GFR, Estimated: >60 mL/min (ref >60)
Glucose, Bld: 85 mg/dL (ref 70–99)
Potassium: 3.4 mmol/L — ABNORMAL LOW (ref 3.5–5.1)
Sodium: 142 mmol/L (ref 135–145)

## 2021-11-02 LAB — URINE CULTURE: Culture: 70000 — AB

## 2021-11-02 LAB — CBC
HCT: 37.7 % (ref 36.0–46.0)
Hemoglobin: 11.9 g/dL — ABNORMAL LOW (ref 12.0–15.0)
MCH: 26.6 pg (ref 26.0–34.0)
MCHC: 31.6 g/dL (ref 30.0–36.0)
MCV: 84.3 fL (ref 80.0–100.0)
Platelets: 455 10*3/uL — ABNORMAL HIGH (ref 150–400)
RBC: 4.47 MIL/uL (ref 3.87–5.11)
RDW: 15 % (ref 11.5–15.5)
WBC: 8.5 10*3/uL (ref 4.0–10.5)
nRBC: 0 % (ref 0.0–0.2)

## 2021-11-02 MED ORDER — FOSFOMYCIN TROMETHAMINE 3 G PO PACK
3.0000 g | PACK | Freq: Once | ORAL | Status: AC
  Administered 2021-11-02: 3 g via ORAL
  Filled 2021-11-02: qty 3

## 2021-11-02 MED ORDER — POTASSIUM CHLORIDE CRYS ER 20 MEQ PO TBCR
30.0000 meq | EXTENDED_RELEASE_TABLET | Freq: Two times a day (BID) | ORAL | Status: AC
  Administered 2021-11-02 (×2): 30 meq via ORAL
  Filled 2021-11-02: qty 1

## 2021-11-02 MED ORDER — POLYETHYLENE GLYCOL 3350 17 G PO PACK
17.0000 g | PACK | Freq: Every day | ORAL | Status: DC
  Administered 2021-11-02 – 2021-11-03 (×2): 17 g via ORAL
  Filled 2021-11-02 (×2): qty 1

## 2021-11-02 MED ORDER — SENNOSIDES-DOCUSATE SODIUM 8.6-50 MG PO TABS: 2.0000 | ORAL_TABLET | Freq: Every evening | ORAL | Status: DC | PRN

## 2021-11-02 NOTE — Progress Notes (Addendum)
?PROGRESS NOTE ? ? ? ?Wendy Nelson  JSH:[id number] DOB: 1966/04/14 DOA: 10/30/2021 ?PCP: [facility]  ? ? ?Chief Complaint  ?Patient presents with  ? Weakness  ? ? 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  Patient was hypotensive on arrival, blood pressure improved with IV fluid bolus.  Patient admitted for complicated UTI,  started on IV antibiotics. Blood and urine cultures were sent.  Urine culture shows 3 different bacteria ?  ?Assessment & Plan: ? ?Complicated UTI (urinary tract infection) ?Hypotension ?Neurogenic bladder ?Sepsis (hypotension, increased RR, source) ?Lactic acidosis ?Metabolic encephalopathy ?- Multiple colonies on UC ?- BC remain NGTD from 4/17 ?- Continue unasyn, likely 7-10 day course of antibiotics.  Enterococcus and klebsiella sensitive to this medication, but e.coli is only intermediate, will discuss with pharmacy ?- continue to monitor ?- Remains mildly encephalopathic, but improved from yesterday - baseline per family is clear and able to understand care and carry on normal conversations ?- Urine is clear, yellow in foley bag - curious if this was changed on admission, will investigate ?- Fluids d/c'd, will monitor off of fluids ?- BP improved to 142/82 ?- D/C telemetry ? ?HTN (hypertension) ?- Holding home antihypertensives at this time ?- BP is improved, however, will need further recovery time - likely restart one medication this evening or in the morning ?- Home regimen includes amlodipine and losartan ?   ?Depression with anxiety ?- continue home duloxetine and trazodone ?    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 ?Mild hypokalemia - replace today ? ?Constipation: bowel regimen started today ? ? ? ?DVT prophylaxis: Lovenox ?Code Status: Full ?Family Communication: Spoke with Ms. Stach (mother to [place]) and explained situation with rehab.  Family is now aware.  Plan to return to current custodial SNF.  Likely able for discharge tomorrow.  I advised the family that further evaluation for neuro rehab would be in the outpatient setting.  ?Disposition:  ? ?Status is: Inpatient ?Remains inpatient appropriate because: continued infection, sepsis and continued encephalopathy, antibiotic choice, change to oral tomorrow ?  ?Consultants:  ?None ? ?Antimicrobials:  ?Cefepime 4/17 --> 4/19 ?Unasyn 4/19 -->   ? ? ?Subjective: ?Improved mentation, no complaints.  Continues to be slow to ask questions, but otherwise improving.  ? ?Objective: ?Vitals:  ? 11/01/21 1628 11/01/21 2019 11/02/21 8502 11/02/21 7741  ?BP: 102/73 (!) 121/95 119/78 (!) 142/82  ?Pulse:  83 83 74  ?Resp: 16 16 16 16   ?Temp:  (!) 97.4 ?F (36.3 ?C) (!) 97.5 ?F (36.4 ?C) 97.6 ?F (36.4 ?C)  ?TempSrc:  Oral Oral   ?SpO2:  100% 97% 91%  ?Weight:      ? ? ?Intake/Output Summary (Last 24 hours) at 11/02/2021 0912 ?Last data filed at 11/02/2021 0500 ?Gross per 24 hour  ?Intake 482.16 ml  ?Output 1375 ml  ?Net -892.84 ml  ? ?Filed Weights  ? 10/30/21 0339  ?Weight: 120.7 kg  ? ? ?Examination: ? ?General exam: Appears calm and comfortable, deconditioned, speech improved ?Respiratory system: Normal work of breathing, no audible wheezing ?Cardiovascular system: RR, NR,  no murmur noted, she has some non pitting edema to legs, up to knees - this is unchanged ?Gastrointestinal system: +BS, soft, NT ?GU: Foley in place draining clear yellow urine ?Central nervous system: Alert, improved orientation as  noted ?Extremities: moves upper extremities spontaneously,V ery minimal movement 1/5 in the lower extremities.  Upper extremity is 4/5 in the left and 4/5 in the right with decreased grip strength.  ?Skin: No rashes, lesions or ulcers on exposed skin ?Psychiatry: Attention is improved, speech is improved ? ? ? ?Data Reviewed: Personally reviewed ? ?CBC: ?Recent Labs  ?Lab 10/30/21 ?0107 11/01/21 ?[id number] 11/02/21 ?0354  ?WBC 10.4 7.6 8.5  ?NEUTROABS 6.1  --   --   ?HGB 10.3* 11.2* 11.9*  ?HCT 32.6* 35.3* 37.7  ?MCV 84.9 83.3 84.3  ?PLT 370 460* 455*  ? ? ?Basic Metabolic Panel: ?Recent Labs  ?Lab 10/30/21 ?0107 11/02/21 ?0354  ?NA 136 142  ?K 3.6 3.4*  ?CL 101 106  ?CO2 24 29  ?GLUCOSE 112* 85  ?BUN 15 9  ?CREATININE 0.79 0.67  ?CALCIUM 8.2* 8.7*  ? ? ?GFR: ?Estimated Creatinine Clearance: 104.7 mL/min (by C-G formula based on SCr of 0.67 mg/dL). ? ?Liver Function Tests: ?Recent Labs  ?Lab 10/30/21 ?0107  ?AST 33  ?ALT 27  ?ALKPHOS 152*  ?BILITOT 0.8  ?PROT 7.5  ?ALBUMIN 2.7*  ? ? ?CBG: ?No results for input(s): GLUCAP in the last 168 hours. ? ? ?Recent Results (from the past 240 hour(s))  ?Blood Culture (routine x 2)     Status: None (Preliminary result)  ? Collection Time: 10/30/21  1:07 AM  ? Specimen: BLOOD  ?Result Value Ref Range Status  ? Specimen Description BLOOD RIGHT FA  Final  ? Special Requests   Final  ?  BOTTLES DRAWN AEROBIC AND ANAEROBIC Blood Culture results may not be optimal due to an inadequate volume of blood received in culture bottles  ? Culture   Final  ?  NO GROWTH 3 DAYS ?Performed at [facility], [address] [place], [place] [address] ?  ? Report Status PENDING  Incomplete  ?Blood Culture (routine x 2)     Status: None (Preliminary result)  ? Collection Time: 10/30/21  1:07 AM  ? Specimen: BLOOD  ?Result Value Ref Range Status  ? Specimen Description BLOOD RIGHT AC  Final  ? Special Requests   Final  ?  BOTTLES DRAWN AEROBIC ONLY Blood Culture adequate volume  ? Culture    Final  ?  NO GROWTH 3 DAYS ?Performed at [facility], [address] [place], [place] [address] ?  ? Report Status PENDING  Incomplete  ?Urine Culture     Status: Abnormal  ? Collection Time: 10/30/21  1:07 AM  ? Specimen: In/Out Cath Urine  ?Result Value Ref Range Status  ? Specimen Description   Final  ?  IN/OUT CATH URINE ?Performed at [facility], [address] [place], [place] [address] ?  ? Special Requests   Final  ?  NONE ?Performed at [facility], [address] [place], [place] [address] ?  ? Culture (A)  Final  ?  70,000 COLONIES/mL ENTEROCOCCUS FAECALIS ?20,000 COLONIES/mL KLEBSIELLA PNEUMONIAE ?30,000 COLONIES/mL ESCHERICHIA COLI ?  ? Report Status 11/02/2021 FINAL  Final  ? Organism ID, Bacteria ENTEROCOCCUS FAECALIS (A)  Final  ? Organism ID, Bacteria KLEBSIELLA PNEUMONIAE (A)  Final  ? Organism ID, Bacteria ESCHERICHIA COLI (A)  Final  ?    Susceptibility  ?  Escherichia coli - MIC*  ?  AMPICILLIN 16 INTERMEDIATE Intermediate   ?  CEFAZOLIN <=4 SENSITIVE Sensitive   ?  CEFTRIAXONE <=0.25 SENSITIVE Sensitive   ?  CIPROFLOXACIN <=0.25 SENSITIVE Sensitive   ?  GENTAMICIN <=1 SENSITIVE Sensitive   ?  IMIPENEM <=0.25 SENSITIVE Sensitive   ?  NITROFURANTOIN 32 SENSITIVE Sensitive   ?  TRIMETH/SULFA <=20 SENSITIVE Sensitive   ?  AMPICILLIN/SULBACTAM 16 INTERMEDIATE Intermediate   ?  * 30,000 COLONIES/mL ESCHERICHIA COLI  ? Enterococcus faecalis - MIC*  ?  AMPICILLIN <=2 SENSITIVE Sensitive   ?  NITROFURANTOIN <=16 SENSITIVE Sensitive   ?  VANCOMYCIN 1 SENSITIVE Sensitive   ?  * 70,000 COLONIES/mL ENTEROCOCCUS FAECALIS  ? Klebsiella pneumoniae - MIC*  ?  AMPICILLIN RESISTANT Resistant   ?  CEFAZOLIN <=4 SENSITIVE Sensitive   ?  CEFEPIME <=0.12 SENSITIVE Sensitive   ?  CEFTRIAXONE <=0.25 SENSITIVE Sensitive   ?  CIPROFLOXACIN <=0.25 SENSITIVE Sensitive   ?  GENTAMICIN <=1 SENSITIVE Sensitive   ?  IMIPENEM <=0.25 SENSITIVE Sensitive   ?  NITROFURANTOIN 64 INTERMEDIATE  Intermediate   ?  TRIMETH/SULFA <=20 SENSITIVE Sensitive   ?  AMPICILLIN/SULBACTAM <=2 SENSITIVE Sensitive   ?  PIP/TAZO <=4 SENSITIVE Sensitive   ?  * 20,000 COLONIES/mL KLEBSIELLA PNEUMONIAE  ?Resp Panel by RT-PCR (Flu A&B

## 2021-11-02 NOTE — TOC Progression Note (Signed)
Transition of Care (TOC) - Progression Note  ? ? ?Patient Details  ?Name: Wendy Nelson ?MRN: [id number] ?Date of Birth: Apr 13, 1966 ? ?Transition of Care (TOC) CM/SW Contact  ?Caryn Section, RN ?Phone Number: ?11/02/2021, 4:01 PM ? ?Clinical Narrative:   RNCM spoke with patient's mother and explained information received from facilities.  [facility] states that patient is a client of [facility], who could need to approve therapy at this point for patient to receive therapy.  [facility] is willing to receive patient back for Long Term Care, but approval for therapy is the decision of [facility] and [employer] at this point. ? ?Spoke with [facility] and they could take patient short term if SNF therapy is indicated but they do not have long term beds. ? ?RNCM spoke to D. Lenise Arena at [facility], who states they would have a long term bed, but again cannot guarantee therapy unless approved by [facility] and [employer]. ? ?Discussed with patient mother, who states that they are under the impression patient might walk again.  RNCM explained to patient's mother that it is not within the scope of this position to determine this.  Hospitalist to contact patient's mother after chart review.  Hospitalist aware of this discussion. ? ?Patient's mother will contact [facility] for information about possible transfer, following communication of all information. ? ? ? ?Expected Discharge Plan: Skilled Nursing Facility ?Barriers to Discharge: Continued Medical Work up ? ?Expected Discharge Plan and Services ?Expected Discharge Plan: Skilled Nursing Facility ?  ?Discharge Planning Services: CM Consult ?Post Acute Care Choice: Skilled Nursing Facility, Resumption of Svcs/PTA Provider ?Living arrangements for the past 2 months: Skilled Nursing Facility ?                ?DME Arranged: N/A ?DME Agency: NA ?  ?  ?  ?HH Arranged: NA ?HH Agency: NA ?  ?  ?  ? ? ?Social Determinants of Health (SDOH) Interventions ?  ? ?Readmission Risk  Interventions ?   ? View : No data to display.  ?  ?  ?  ? ? ?

## 2021-11-02 NOTE — Care Management Important Message (Signed)
Important Message ? ?Patient Details  ?Name: Wendy Nelson ?MRN: [id number] ?Date of Birth: 24-May-1966 ? ? ?Medicare Important Message Given:  Yes ? ? ? ? ?Johnell Comings ?11/02/2021, 2:47 PM ?

## 2021-11-03 DIAGNOSIS — N390 Urinary tract infection, site not specified: Secondary | ICD-10-CM | POA: Diagnosis not present

## 2021-11-03 LAB — BASIC METABOLIC PANEL
Anion gap: 7 (ref 5–15)
BUN: 7 mg/dL (ref 6–20)
CO2: 27 mmol/L (ref 22–32)
Calcium: 8.7 mg/dL — ABNORMAL LOW (ref 8.9–10.3)
Chloride: 112 mmol/L — ABNORMAL HIGH (ref 98–111)
Creatinine, Ser: 0.59 mg/dL (ref 0.44–1.00)
GFR, Estimated: >60 mL/min (ref >60)
Glucose, Bld: 86 mg/dL (ref 70–99)
Potassium: 4 mmol/L (ref 3.5–5.1)
Sodium: 146 mmol/L — ABNORMAL HIGH (ref 135–145)

## 2021-11-03 MED ORDER — OXYCODONE HCL 5 MG PO TABS: 5.0000 mg | ORAL_TABLET | ORAL | 0 refills | Status: DC | PRN

## 2021-11-03 NOTE — TOC Progression Note (Signed)
Transition of Care (TOC) - Progression Note  ? ? ?Patient Details  ?Name: [name] ?MRN: [id number] ?Date of Birth: [birth year] ? ?Transition of Care (TOC) CM/SW Contact  ?Caryn Section, RN ?Phone Number: ?11/03/2021, 11:44 AM ? ?Clinical Narrative:  Patient to be transferred to [facility] today, okay per Tammy.  Patient will discharge via [facility].  Family amenable and aware. ? ? ? ?Expected Discharge Plan: Skilled Nursing Facility ?Barriers to Discharge: Continued Medical Work up ? ?Expected Discharge Plan and Services ?Expected Discharge Plan: Skilled Nursing Facility ?  ?Discharge Planning Services: CM Consult ?Post Acute Care Choice: Skilled Nursing Facility, Resumption of Svcs/PTA Provider ?Living arrangements for the past 2 months: Skilled Nursing Facility ?Expected Discharge Date: 11/03/21               ?DME Arranged: N/A ?DME Agency: NA ?  ?  ?  ?HH Arranged: NA ?HH Agency: NA ?  ?  ?  ? ? ?Social Determinants of Health (SDOH) Interventions ?  ? ?Readmission Risk Interventions ?   ? View : No data to display.  ?  ?  ?  ? ? ?

## 2021-11-03 NOTE — Progress Notes (Signed)
Called [facility] 11/03/2021 1215 p.m. @ [phone], provided pt report to [place], nursing supervisor. All questions addressed at time of call. ?

## 2021-11-03 NOTE — Discharge Summary (Signed)
? ?Physician Discharge Summary  ?[name] YYT:[id number] DOB: [birth year] DOA: 10/30/2021 ? ?PCP: [facility] ? ?Admit date: 10/30/2021 ?Discharge date: 11/03/2021 ? ?Admitted From: SNF ?Disposition:  SNF ? ?Recommendations for Outpatient Follow-up:  ?Follow up with PCP in 1-2 weeks ? ?Home Health: none ?Equipment/Devices: none ? ?Discharge Condition: stable ?CODE STATUS: Full code ?Diet recommendation: regular ? ?HPI: Per admitting MD, ?Wendy Nelson is a 56 y.o. 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  Patient was started started on Rocephin on 4/15 for possible UTI .  On arrival of EMS systolic blood pressure reportedly in the 60s for which an IV fluid bolus of 200 mils was administered. ED course and data reviewed.  On arrival temperature 99.6 with pulse of 94.  BP initially 156/107 dropping to as low as 82/65 but fluid responsive to 107/69.  O2 sat at 1 point dropped to 84% on room air requiring 2 L to maintain sats in the mid 90s. Blood work significant for WBC of 10.4 with lactic acid 1.3 and procalcitonin 0.15.  Hemoglobin 10.3.  Creatinine within normal limits.  Urinalysis consistent with UTI.  EKG, personally viewed and interpreted shows sinus rhythm at 89 with nonspecific ST-T wave changes.  Chest x-ray showed no active disease. Patient given a IV fluid bolus and started on cefepime for possible sepsis.  Hospitalist consulted for admission.  ? ?Hospital Course / Discharge diagnoses: ?Principal Problem: ?  Complicated UTI (urinary tract infection) ?Active Problems: ?   Hypotension ?  SIRS (systemic inflammatory response syndrome), possible sepsis(HCC) ?  Neurogenic bladder ?  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  HTN (hypertension) ?  Depression with anxiety ?  Lactic acidosis ? ? ?Assessment and Plan: ?Sepsis due to UTI due to chronic indwelling Foley, acute metabolic encephalopathy - Multiple colonies on UC, BC remain negative. S/p phosphomycin. Improved, returned to baseline, will dc back to SNF ?HTN (hypertension) -apparently is not taking amlodipine, losartan per SNF. Normotensive here without those. 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 ?Sepsis ruled out ? ? ?Discharge Instructions ? ? ?Allergies as of 11/03/2021   ? ?   Reactions  ? Melatonin   ? Neomycin Other (See Comments)  ? Positive patch test  ? Sulfa Antibiotics   ? ?  ? ?  ?Medication List  ?  ? ?STOP taking these medications   ? ?amLODipine 5 MG tablet ?Commonly known as: NORVASC ?  ?cefTRIAXone 1 g injection ?Commonly known as: ROCEPHIN ?  ?celecoxib 100 MG capsule ?Commonly known as: CELEBREX ?  ?losartan 100 MG tablet ?Commonly known as: COZAAR ?  ?polyethylene glycol 17 g packet ?Commonly known as: MIRALAX / GLYCOLAX ?  ? ?  ? ?TAKE these medications   ? ?acamprosate 333 MG tablet ?Commonly known as: CAMPRAL ?Take 333 mg by mouth 3 (three) times daily. ?  ?Acetaminophen 500 MG capsule ?Take 1,000 mg by mouth every 6 (six) hours as needed. ?  ?aspirin  81 MG chewable tablet ?Chew 81 mg by mouth daily. ?  ?atorvastatin 10 MG tablet ?Commonly known as: LIPITOR ?Take 10 mg by mouth daily. ?  ?bisacodyl 10 MG suppository ?Commonly known as: DULCOLAX ?Place 1 suppository (10 mg total) rectally daily as needed for  moderate constipation. ?  ?Cheratussin AC 100-10 MG/5ML syrup ?Generic drug: guaiFENesin-codeine ?Cheratussin AC 10 mg-100 mg/5 mL oral liquid ?  ?DermaPhor Oint ?Apply 1 application. topically daily. To feet. ?  ?dicyclomine 10 MG capsule ?Commonly known as: BENTYL ?Take 10 mg by mouth 2 (two) times daily. ?  ?DULoxetine 60 MG capsule ?Commonly known as: CYMBALTA ?Take 60 mg by mouth daily. ?  ?DULoxetine 30 MG capsule ?Commonly known as: CYMBALTA ?Take 30 mg by mouth daily. ?  ?Eliquis 5 MG Tabs tablet ?Generic drug: apixaban ?Take 5 mg by mouth every other day. ?  ?Ergocalciferol 50 MCG (2000 UT) Tabs ?Take 2,000 Units by mouth daily. ?  ?fluticasone 50 MCG/ACT nasal spray ?Commonly known as: FLONASE ?Place 1 spray into both nostrils daily. ?  ?ipratropium-albuterol 0.5-2.5 (3) MG/3ML Soln ?Commonly known as: DUONEB ?Take 3 mLs by nebulization every 4 (four) hours as needed. ?  ?lidocaine 5 % ?Commonly known as: LIDODERM ?Place 1 patch onto the skin daily. Remove & Discard patch within 12 hours or as directed by MD ?  ?loratadine 10 MG tablet ?Commonly known as: CLARITIN ?Take 10 mg by mouth daily. ?  ?methocarbamol 500 MG tablet ?Commonly known as: ROBAXIN ?Take 1 tablet (500 mg total) by mouth every 6 (six) hours as needed for muscle spasms. ?  ?multivitamin tablet ?Take 1 tablet by mouth daily. ?  ?nystatin powder ?Commonly known as: MYCOSTATIN/NYSTOP ?Apply topically 2 (two) times daily. ?  ?omeprazole 20 MG capsule ?Commonly known as: PRILOSEC ?Take 20 mg by mouth daily. ?  ?ondansetron 4 MG tablet ?Commonly known as: ZOFRAN ?Take 4 mg by mouth every 6 (six) hours as needed for nausea. ?  ?oxyCODONE 5 MG immediate release tablet ?Commonly known as: Oxy IR/ROXICODONE ?Take 1 tablet (5 mg total) by mouth every 4 (four) hours as needed for severe pain. ?  ?Polyethylene Glycol 400 1 % Soln ?Place 2 drops into both eyes daily. ?  ?pregabalin 150 MG capsule ?Commonly known as: LYRICA ?Take 150 mg by mouth 2 (two)  times daily. ?  ?senna-docusate 8.6-50 MG tablet ?Commonly known as: Senokot-S ?Take 1 tablet by mouth daily. ?  ?tiZANidine 2 MG tablet ?Commonly known as: ZANAFLEX ?Take 2 mg by mouth 2 (two) times daily. ?  ?traZODone 50 MG tablet ?Commonly known as: DESYREL ?Take 50 mg by mouth at bedtime. ?  ?vitamin C 500 MG tablet ?Commonly known as: ASCORBIC ACID ?Take 500 mg by mouth 2 (two) times daily. ?  ? ?  ? ? ? ?Consultations: ?none ? ?Procedures/Studies: ? ?DG Chest Port 1 View ? ?Result Date: 10/30/2021 ?CLINICAL DATA:  Weakness with cough and congestion. EXAM: PORTABLE CHEST 1 VIEW COMPARISON:  April 28, 2021 FINDINGS: Overlying radiopaque cardiac lead wires are seen. The heart size and mediastinal contours are within normal limits. Both lungs are clear. Postoperative changes are seen within the visualized portion of the cervical spine. The visualized skeletal structures are unremarkable. IMPRESSION: No active disease. Electronically Signed   By: Aram Candelahaddeus  Houston M.D.   On: 10/30/2021 00:54   ? ? ?Subjective: ?- no chest pain, shortness of breath, no abdominal pain, nausea or vomiting.  ? ?Discharge Exam: ?BP 120/72 (BP Location: Right Arm)  Pulse 73   Temp 97.8 ?F (36.6 ?C)   Resp 17   Wt 120.7 kg   SpO2 100%   BMI 44.26 kg/m?  ? ?General: Pt is alert, awake, not in acute distress ?Cardiovascular: RRR, S1/S2 +, no rubs, no gallops ?Respiratory: CTA bilaterally, no wheezing, no rhonchi ?Abdominal: Soft, NT, ND, bowel sounds + ?Extremities: no edema, no cyanosis ? ?The results of significant diagnostics from this hospitalization (including imaging, microbiology, ancillary and laboratory) are listed below for reference.   ? ? ?Microbiology: ?Recent Results (from the past 240 hour(s))  ?Blood Culture (routine x 2)     Status: None (Preliminary result)  ? Collection Time: 10/30/21  1:07 AM  ? Specimen: BLOOD  ?Result Value Ref Range Status  ? Specimen Description BLOOD RIGHT FA  Final  ? Special Requests    Final  ?  BOTTLES DRAWN AEROBIC AND ANAEROBIC Blood Culture results may not be optimal due to an inadequate volume of blood received in culture bottles  ? Culture   Final  ?  NO GROWTH 4 DAYS ?Performed at [facility]

## 2021-11-04 LAB — CULTURE, BLOOD (ROUTINE X 2)
Culture: NO GROWTH
Culture: NO GROWTH
Special Requests: ADEQUATE

## 2021-12-20 ENCOUNTER — Telehealth: Payer: Self-pay | Admitting: Urology

## 2021-12-20 NOTE — Telephone Encounter (Signed)
Patient called and lm asking for an appt I have tried calling her back to schedule. Her voicemail is full, I have not been able to reach her. She will need the room with the lift.  Wendy Nelson

## 2021-12-28 ENCOUNTER — Encounter: Payer: Self-pay | Admitting: Urology

## 2021-12-28 ENCOUNTER — Ambulatory Visit (INDEPENDENT_AMBULATORY_CARE_PROVIDER_SITE_OTHER): Payer: Medicare Other | Admitting: Urology

## 2021-12-28 VITALS — Ht 65.0 in

## 2021-12-28 DIAGNOSIS — R8271 Bacteriuria: Secondary | ICD-10-CM

## 2021-12-28 DIAGNOSIS — N319 Neuromuscular dysfunction of bladder, unspecified: Secondary | ICD-10-CM

## 2021-12-28 NOTE — Progress Notes (Signed)
12/28/21 2:48 PM   Wendy Nelson [birth year] [id number]  Referring provider:  [facility] [address] [place],  [place] [address] Chief Complaint  Patient presents with   Hematuria      HPI: Wendy Nelson is a 56 y.o.female female with a personal history of neurogenic bladder, bilateral hydronephrosis, pyelonephrosis, and gross hematuria, who presents today for further evaluation of hematuria.   She underwent a UDS and had a max capacity of approx. 526 mL.  She did not feel any sensation while filing her bladder but did feel some pressure around 515 mls inserted.  There was positive low amplitude instability.  She did not feel any sensation with these contractions but did leak a mild amount from them.  There were no vitals spasms noted today.  She did not generate voluntary contraction and void x-ray not done due to body habitus.     She underwent a CT urogram on 09/07/2021 that visualized Perivesicular stranding with symmetric wall thickening of the urinary bladder which is decompressed around a Foley catheter. Findings may represent cystitis.Prominence of the right ureter and collecting system without discrete hydronephrosis.No renal, ureteral, or bladder calculi identified.No solid enhancing renal mass.  Cystoscopy on 10/03/2021 visualized cystitis primarily on the posterior bladder wall  She was seen in the ED on 10/30/2021 she presented with weakness, hypotension, and tachypnea. UA was consistent with UTI her culture grew multiple colonies on UC, BC remained negative. She was treated with fosfomycin.  Urine culture ended up growing 3 different organisms.  Sepsis was ruled out.  She reports that she returns today due to her recurrent UTIs she takes cranberry tablets.  She was told to follow-up by her primary care.  She wonders if anything can be done about her "recurrent UTIs".  Her catheter was last changed less than a month ago.  [facility] is currently residing at [facility] where she has lived for about a year with ultimate goal of getting home.  She is accompanied today by her daughter.  PMH: Past Medical History:  Diagnosis Date   Quadriplegia [facility])    Stroke (HCC) 09/2020    Surgical History: Past Surgical History:  Procedure Laterality Date   CERVICAL SPINE SURGERY     JOINT REPLACEMENT Right    Knee    Home Medications:  Allergies as of 12/28/2021       Reactions   Melatonin    Neomycin Other (See Comments)   Positive patch test   Sulfa Antibiotics         Medication List        Accurate as of December 28, 2021  2:48 PM. If you have any questions, ask your nurse or doctor.          acamprosate 333 MG tablet Commonly known as: CAMPRAL Take 333 mg by mouth 3 (three) times daily.   Acetaminophen 500 MG capsule Take 1,000 mg by mouth every 6 (six) hours as needed.   aspirin 81 MG chewable tablet Chew 81 mg by mouth daily.   atorvastatin 10 MG tablet Commonly known as: LIPITOR Take 10 mg by mouth daily.   bisacodyl 10 MG suppository Commonly known as: DULCOLAX Place 1 suppository (10 mg total) rectally daily as needed for moderate constipation.   Cheratussin AC 100-10 MG/5ML syrup Generic drug: guaiFENesin-codeine   DermaPhor Oint Apply 1 application. topically daily. To feet.   dicyclomine 10 MG capsule Commonly known as: BENTYL Take 10 mg by mouth 2 (two)  times daily.   DULoxetine 60 MG capsule Commonly known as: CYMBALTA Take 60 mg by mouth daily.   DULoxetine 30 MG capsule Commonly known as: CYMBALTA Take 30 mg by mouth daily.   Eliquis 5 MG Tabs tablet Generic drug: apixaban Take 5 mg by mouth every other day.   Ergocalciferol 50 MCG (2000 UT) Tabs Take 2,000 Units by mouth daily.   fluticasone 50 MCG/ACT nasal spray Commonly known as: FLONASE Place 1 spray into both nostrils daily.   ipratropium-albuterol 0.5-2.5 (3) MG/3ML Soln Commonly known as: DUONEB Take 3 mLs by nebulization every  4 (four) hours as needed.   lidocaine 5 % Commonly known as: LIDODERM Place 1 patch onto the skin daily. Remove & Discard patch within 12 hours or as directed by MD   loratadine 10 MG tablet Commonly known as: CLARITIN Take 10 mg by mouth daily.   methocarbamol 500 MG tablet Commonly known as: ROBAXIN Take 1 tablet (500 mg total) by mouth every 6 (six) hours as needed for muscle spasms.   multivitamin tablet Take 1 tablet by mouth daily.   nystatin powder Commonly known as: MYCOSTATIN/NYSTOP Apply topically 2 (two) times daily.   omeprazole 20 MG capsule Commonly known as: PRILOSEC Take 20 mg by mouth daily.   ondansetron 4 MG tablet Commonly known as: ZOFRAN Take 4 mg by mouth every 6 (six) hours as needed for nausea.   oxyCODONE 5 MG immediate release tablet Commonly known as: Oxy IR/ROXICODONE Take 1 tablet (5 mg total) by mouth every 4 (four) hours as needed for severe pain.   Polyethylene Glycol 400 1 % Soln Place 2 drops into both eyes daily.   pregabalin 150 MG capsule Commonly known as: LYRICA Take 150 mg by mouth 2 (two) times daily.   senna-docusate 8.6-50 MG tablet Commonly known as: Senokot-S Take 1 tablet by mouth daily.   tiZANidine 2 MG tablet Commonly known as: ZANAFLEX Take 2 mg by mouth 2 (two) times daily.   traZODone 50 MG tablet Commonly known as: DESYREL Take 50 mg by mouth at bedtime.   vitamin C 500 MG tablet Commonly known as: ASCORBIC ACID Take 500 mg by mouth 2 (two) times daily.        Allergies:  Allergies  Allergen Reactions   Melatonin    Neomycin Other (See Comments)    Positive patch test   Sulfa Antibiotics     Family History: Family History  Problem Relation Age of Onset   Diabetes Mellitus II Mother    Hypertension Mother    Diabetes Mellitus II Brother     Social History:  reports that she has never smoked. She has never used smokeless tobacco. No history on file for alcohol use and drug  use.   Physical Exam: Ht 5\' 5"  (1.651 m)   BMI 44.26 kg/m   Constitutional:  Alert and oriented, No acute distress. HEENT: [place] AT, moist mucus membranes.  Trachea midline, no masses. Cardiovascular: No clubbing, cyanosis, or edema. Respiratory: Normal respiratory effort, no increased work of breathing. Skin: No rashes, bruises or suspicious lesions. Neurologic: Grossly intact, no focal deficits, moving all 4 extremities. Psychiatric: Normal mood and affect.  Laboratory Data:  Lab Results  Component Value Date   CREATININE 0.59 11/03/2021    Assessment & Plan:    Chronic bacterial colonization  - Discussed the pathophysiology of bacterial colonization to her today and that her urine will always have bacteria in it -Prefer to avoid suppressive antibiotics based on  her previous culture and sensitivity data, high risk for development of continued interval bacterial resistance -Only with GU specific symptoms or signs and symptoms of systemic infection -  Counseled her in UTI prevention supplements such as increasing cranberry tablets x2 daily, probiotics, yogurt that has active lactobacillus culture, and d-mannose  -Mention today that she has been having trouble getting MiraLAX at [facility], will also include this my recommendation note to be able to use a as needed -Continue monthly cath exchanges (CIC versus SP tube as previously discussed, prefers urethral Foley)   Conley Rolls as a scribe for Hollice Espy, MD.,have documented all relevant documentation on the behalf of Hollice Espy, MD,as directed by  Hollice Espy, MD while in the presence of Hollice Espy, MD.  I have reviewed the above documentation for accuracy and completeness, and I agree with the above.   Hollice Espy, MD    [facility] [address] [place], [place] [address] [phone]

## 2022-04-04 ENCOUNTER — Encounter: Payer: Self-pay | Admitting: Emergency Medicine

## 2022-04-04 ENCOUNTER — Emergency Department
Admission: EM | Admit: 2022-04-04 | Discharge: 2022-04-04 | Disposition: A | Discharge status: Home / Self Care | Payer: Medicare Other | Attending: Emergency Medicine | Admitting: Emergency Medicine

## 2022-04-04 ENCOUNTER — Emergency Department: Payer: Medicare Other

## 2022-04-04 ENCOUNTER — Other Ambulatory Visit: Payer: Self-pay

## 2022-04-04 DIAGNOSIS — Z7982 Long term (current) use of aspirin: Secondary | ICD-10-CM | POA: Insufficient documentation

## 2022-04-04 DIAGNOSIS — Z20822 Contact with and (suspected) exposure to covid-19: Secondary | ICD-10-CM | POA: Diagnosis not present

## 2022-04-04 DIAGNOSIS — I10 Essential (primary) hypertension: Secondary | ICD-10-CM | POA: Insufficient documentation

## 2022-04-04 DIAGNOSIS — Z96651 Presence of right artificial knee joint: Secondary | ICD-10-CM | POA: Insufficient documentation

## 2022-04-04 DIAGNOSIS — Z79899 Other long term (current) drug therapy: Secondary | ICD-10-CM | POA: Diagnosis not present

## 2022-04-04 DIAGNOSIS — Z7901 Long term (current) use of anticoagulants: Secondary | ICD-10-CM | POA: Diagnosis not present

## 2022-04-04 DIAGNOSIS — R1084 Generalized abdominal pain: Secondary | ICD-10-CM | POA: Insufficient documentation

## 2022-04-04 DIAGNOSIS — K5900 Constipation, unspecified: Secondary | ICD-10-CM | POA: Insufficient documentation

## 2022-04-04 DIAGNOSIS — R109 Unspecified abdominal pain: Secondary | ICD-10-CM | POA: Diagnosis present

## 2022-04-04 LAB — URINALYSIS, ROUTINE W REFLEX MICROSCOPIC
Bilirubin Urine: NEGATIVE
Glucose, UA: NEGATIVE mg/dL
Ketones, ur: NEGATIVE mg/dL
Nitrite: NEGATIVE
Protein, ur: 30 mg/dL — AB
Specific Gravity, Urine: 1.005 (ref 1.005–1.030)
pH: 7 (ref 5.0–8.0)

## 2022-04-04 LAB — COMPREHENSIVE METABOLIC PANEL
ALT: 47 U/L — ABNORMAL HIGH (ref 0–44)
AST: 30 U/L (ref 15–41)
Albumin: 3.5 g/dL (ref 3.5–5.0)
Alkaline Phosphatase: 126 U/L (ref 38–126)
Anion gap: 7 (ref 5–15)
BUN: 20 mg/dL (ref 6–20)
CO2: 28 mmol/L (ref 22–32)
Calcium: 8.7 mg/dL — ABNORMAL LOW (ref 8.9–10.3)
Chloride: 105 mmol/L (ref 98–111)
Creatinine, Ser: 0.75 mg/dL (ref 0.44–1.00)
GFR, Estimated: >60 mL/min (ref >60)
Glucose, Bld: 107 mg/dL — ABNORMAL HIGH (ref 70–99)
Potassium: 3.7 mmol/L (ref 3.5–5.1)
Sodium: 140 mmol/L (ref 135–145)
Total Bilirubin: 0.6 mg/dL (ref 0.3–1.2)
Total Protein: 7.3 g/dL (ref 6.5–8.1)

## 2022-04-04 LAB — CBC WITH DIFFERENTIAL/PLATELET
Abs Immature Granulocytes: 0.02 10*3/uL (ref 0.00–0.07)
Basophils Absolute: 0.1 10*3/uL (ref 0.0–0.1)
Basophils Relative: 1 %
Eosinophils Absolute: 0.3 10*3/uL (ref 0.0–0.5)
Eosinophils Relative: 5 %
HCT: 38.4 % (ref 36.0–46.0)
Hemoglobin: 12.1 g/dL (ref 12.0–15.0)
Immature Granulocytes: 0 %
Lymphocytes Relative: 43 %
Lymphs Abs: 3 10*3/uL (ref 0.7–4.0)
MCH: 27.3 pg (ref 26.0–34.0)
MCHC: 31.5 g/dL (ref 30.0–36.0)
MCV: 86.5 fL (ref 80.0–100.0)
Monocytes Absolute: 0.5 10*3/uL (ref 0.1–1.0)
Monocytes Relative: 7 %
Neutro Abs: 3.2 10*3/uL (ref 1.7–7.7)
Neutrophils Relative %: 44 %
Platelets: 304 10*3/uL (ref 150–400)
RBC: 4.44 MIL/uL (ref 3.87–5.11)
RDW: 16.6 % — ABNORMAL HIGH (ref 11.5–15.5)
WBC: 7.1 10*3/uL (ref 4.0–10.5)
nRBC: 0 % (ref 0.0–0.2)

## 2022-04-04 LAB — LIPASE, BLOOD: Lipase: 35 U/L (ref 11–51)

## 2022-04-04 LAB — TROPONIN I (HIGH SENSITIVITY): Troponin I (High Sensitivity): 3 ng/L (ref <18)

## 2022-04-04 LAB — SARS CORONAVIRUS 2 BY RT PCR: SARS Coronavirus 2 by RT PCR: NEGATIVE

## 2022-04-04 LAB — PROCALCITONIN: Procalcitonin: <0.1 ng/mL

## 2022-04-04 LAB — LACTIC ACID, PLASMA: Lactic Acid, Venous: 1.3 mmol/L (ref 0.5–1.9)

## 2022-04-04 MED ORDER — DICYCLOMINE HCL 20 MG PO TABS: 20.0000 mg | ORAL_TABLET | Freq: Four times a day (QID) | ORAL | 0 refills | Status: DC

## 2022-04-04 MED ORDER — ONDANSETRON HCL 4 MG/2ML IJ SOLN
4.0000 mg | Freq: Once | INTRAMUSCULAR | Status: AC
  Administered 2022-04-04: 4 mg via INTRAVENOUS
  Filled 2022-04-04: qty 2

## 2022-04-04 MED ORDER — MORPHINE SULFATE (PF) 4 MG/ML IV SOLN
4.0000 mg | Freq: Once | INTRAVENOUS | Status: AC
  Administered 2022-04-04: 4 mg via INTRAVENOUS
  Filled 2022-04-04: qty 1

## 2022-04-04 MED ORDER — SODIUM CHLORIDE 0.9 % IV BOLUS
1000.0000 mL | Freq: Once | INTRAVENOUS | Status: AC
  Administered 2022-04-04: 1000 mL via INTRAVENOUS

## 2022-04-04 MED ORDER — IOHEXOL 350 MG/ML SOLN
125.0000 mL | Freq: Once | INTRAVENOUS | Status: AC | PRN
  Administered 2022-04-04: 125 mL via INTRAVENOUS

## 2022-04-04 MED ORDER — ONDANSETRON 4 MG PO TBDP: 4.0000 mg | ORAL_TABLET | Freq: Three times a day (TID) | ORAL | 0 refills | Status: DC | PRN

## 2022-04-04 MED ORDER — LACTULOSE 10 GM/15ML PO SOLN: 20.0000 g | Freq: Every day | ORAL | 0 refills | Status: DC | PRN

## 2022-04-04 MED ORDER — SODIUM CHLORIDE 0.9 % IV BOLUS: 500.0000 mL | Freq: Once | INTRAVENOUS | Status: DC

## 2022-04-04 NOTE — ED Notes (Addendum)
IV access attempted x 2 unsuccessful x2

## 2022-04-04 NOTE — Discharge Instructions (Signed)
Take Lactulose as needed for bowel movements.  You may take Bentyl and Zofran as needed for abdominal discomfort and nausea.  Return to the ER for any symptoms, persistent vomiting, difficulty breathing or other concerns.

## 2022-04-04 NOTE — ED Provider Notes (Signed)
[facility] Provider Note    Event Date/Time   First MD Initiated Contact with Patient 04/04/22 0023     (approximate)   History   Abdominal Pain   HPI  Wendy Nelson is a 55 y.o. female brought to the ED via EMS from [facility] with a chief complaint of abdominal pain x1 day.  Patient is bedbound secondary to quadriplegia.  States she recently began to feel her abdomen.  Complains of generalized abdominal pain associated with nausea, no vomiting.  Denies fever, cough, chest pain, shortness of breath, diarrhea.  Indwelling Foley in place.     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 09/22/21 ([place])    Sepsis ([place]) 12/23/2020   Neurogenic bladder    Syncope 10/19/2020   AKI (acute kidney injury) ([place]) 10/19/2020   Bilateral hydronephrosis 10/19/2020   HTN (hypertension) 10/19/2020   HLD (hyperlipidemia) 10/19/2020   Depression with anxiety 10/19/2020   Hypotension 09/2020   Cervical spondylosis with myelopathy and radiculopathy 09/2020   Spinal cord infarction [facility]) 09/2020     Past Surgical History   Past Surgical History:  Procedure Laterality Date   CERVICAL SPINE SURGERY     JOINT REPLACEMENT Right    Knee     Home Medications   Prior to Admission medications   Medication Sig Start Date End Date Taking? Authorizing Provider  acamprosate (CAMPRAL) 333 MG tablet Take 333 mg by mouth 3 (three) times daily. 09/05/20   [provider]  Acetaminophen 500 MG capsule Take 1,000 mg by mouth every 6 (six) hours as needed.  10/12/20   [provider]  aspirin 81 MG chewable tablet Chew 81 mg by mouth daily.    [provider]  atorvastatin (LIPITOR) 10 MG tablet Take 10 mg by mouth daily.    [provider]  bisacodyl (DULCOLAX) 10 MG suppository Place 1 suppository (10 mg total) rectally daily as needed for moderate constipation. 01/02/21   Geradine Girt, DO  dicyclomine (BENTYL) 10 MG capsule Take 10 mg by mouth 2 (two) times daily. 08/04/20 08/04/21  [provider]  DULoxetine (CYMBALTA) 30 MG capsule Take 30 mg by mouth daily. 10/15/21   [provider]  DULoxetine (CYMBALTA) 60 MG capsule Take 60 mg by mouth daily. 09/11/21   [provider]  ELIQUIS 5 MG TABS tablet Take 5 mg by mouth every other day. 09/22/21   [provider]  Emollient [facility]) OINT Apply 1 application. topically daily. To feet.    [provider]  Ergocalciferol 50 MCG (2000 UT) TABS Take 2,000 Units by mouth daily.    [provider]  fluticasone (FLONASE) 50 MCG/ACT nasal spray Place 1 spray into both nostrils daily.    [provider]  guaiFENesin-codeine (CHERATUSSIN AC) 100-10 MG/5ML syrup     [provider]  ipratropium-albuterol (DUONEB) 0.5-2.5 (3) MG/3ML SOLN Take 3 mLs by nebulization every 4 (four) hours as needed.    [provider]  lidocaine (LIDODERM) 5 % Place 1 patch onto the skin daily. Remove & Discard patch within  12 hours or as directed by MD 10/21/20   Fritzi Mandes, MD  loratadine (CLARITIN) 10 MG tablet Take 10 mg by mouth daily.    [provider]  methocarbamol (ROBAXIN) 500 MG tablet Take 1 tablet (500 mg total) by mouth every 6 (six) hours as needed for muscle spasms. 01/02/21   Geradine Girt, DO  Multiple Vitamin (MULTIVITAMIN) tablet Take 1 tablet by mouth daily.    [provider]  nystatin (MYCOSTATIN/NYSTOP) powder Apply topically 2 (two) times daily. 01/02/21   Geradine Girt, DO   omeprazole (PRILOSEC) 20 MG capsule Take 20 mg by mouth daily. 10/20/21   [provider]  ondansetron (ZOFRAN) 4 MG tablet Take 4 mg by mouth every 6 (six) hours as needed for nausea.    [provider]  oxyCODONE (OXY IR/ROXICODONE) 5 MG immediate release tablet Take 1 tablet (5 mg total) by mouth every 4 (four) hours as needed for severe pain. 11/03/21   Caren Griffins, MD  Polyethylene Glycol 400 1 % SOLN Place 2 drops into both eyes daily.    [provider]  pregabalin (LYRICA) 150 MG capsule Take 150 mg by mouth 2 (two) times daily. 09/25/21   [provider]  senna-docusate (SENOKOT-S) 8.6-50 MG tablet Take 1 tablet by mouth daily.    [provider]  tiZANidine (ZANAFLEX) 2 MG tablet Take 2 mg by mouth 2 (two) times daily. 09/14/20   [provider]  traZODone (DESYREL) 50 MG tablet Take 50 mg by mouth at bedtime. 09/11/21   [provider]  vitamin C (ASCORBIC ACID) 500 MG tablet Take 500 mg by mouth 2 (two) times daily.    [provider]  hydrochlorothiazide (HYDRODIURIL) 25 MG tablet Take 1 tablet by mouth daily. Patient not taking: Reported on 12/23/2020 12/14/16 12/24/20  [provider]     Allergies  Melatonin, Neomycin, and Sulfa antibiotics   Family History   Family History  Problem Relation Age of Onset   Diabetes Mellitus II Mother    Hypertension Mother    Diabetes Mellitus II Brother      Physical Exam  Triage Vital Signs: ED Triage Vitals  Enc Vitals Group     BP --      Pulse --      Resp --      Temp --      Temp src --      SpO2 --      Weight --      Height 04/04/22 0025 5\' 5"  (1.651 m)     Head Circumference --      [facility] --      Pain Score 04/04/22 0024 5     Pain Loc --      Pain Edu? --      Excl. in [place]? --     Updated Vital Signs: BP 124/85 (BP Location: Right Arm)   Pulse 69   Temp 98.2 F (36.8 C) (Oral)   Resp 19   Ht 5\' 5"  (1.651 m)   Wt 122.5 kg    SpO2 95%   BMI 44.93 kg/m    General: Awake, mild distress.  CV:  RRR.  Good peripheral perfusion.  Resp:  Normal effort.  CTA B. Abd:  Obese.  Mild diffuse tenderness to palpation without rebound or guarding.  No distention.  Other:  No truncal vesicles.  Indwelling Foley catheter noted with purple urine.   ED Results / Procedures /  Treatments  Labs (all labs ordered are listed, but only abnormal results are displayed) Labs Reviewed  CBC WITH DIFFERENTIAL/PLATELET - Abnormal; Notable for the following components:      Result Value   RDW 16.6 (*)    All other components within normal limits  COMPREHENSIVE METABOLIC PANEL - Abnormal; Notable for the following components:   Glucose, Bld 107 (*)    Calcium 8.7 (*)    ALT 47 (*)    All other components within normal limits  URINALYSIS, ROUTINE W REFLEX MICROSCOPIC - Abnormal; Notable for the following components:   Color, Urine YELLOW (*)    APPearance HAZY (*)    Hgb urine dipstick LARGE (*)    Protein, ur 30 (*)    Leukocytes,Ua MODERATE (*)    Bacteria, UA MANY (*)    All other components within normal limits  SARS CORONAVIRUS 2 BY RT PCR  URINE CULTURE  CULTURE, BLOOD (ROUTINE X 2)  CULTURE, BLOOD (ROUTINE X 2)  LIPASE, BLOOD  LACTIC ACID, PLASMA  PROCALCITONIN  TROPONIN I (HIGH SENSITIVITY)  TROPONIN I (HIGH SENSITIVITY)     EKG  None   RADIOLOGY I have independently visualized and interpreted patient's x-ray and CT scan as well as noted the radiology interpretations:  X-ray: No acute cardiopulmonary process  CT abdomen/pelvis: No acute intra-abdominal process; moderate stool burden  Official radiology report(s): CT Abdomen Pelvis W Contrast  Result Date: 04/04/2022 CLINICAL DATA:  Abdominal pain. EXAM: CT ABDOMEN AND PELVIS WITH CONTRAST TECHNIQUE: Multidetector CT imaging of the abdomen and pelvis was performed using the standard protocol following bolus administration of intravenous contrast.  RADIATION DOSE REDUCTION: This exam was performed according to the departmental dose-optimization program which includes automated exposure control, adjustment of the mA and/or kV according to patient size and/or use of iterative reconstruction technique. CONTRAST:  OMNIPAQUE IOHEXOL 350 MG/ML SOLN COMPARISON:  September 07, 2021 FINDINGS: Lower chest: Mild linear atelectasis is seen within the bilateral lung bases. Hepatobiliary: No focal liver abnormality is seen. No gallstones, gallbladder wall thickening, or biliary dilatation. Pancreas: Unremarkable. No pancreatic ductal dilatation or surrounding inflammatory changes. Spleen: Normal in size without focal abnormality. Adrenals/Urinary Tract: Adrenal glands are unremarkable. Areas of renal cortical thinning and cortical scarring are noted without renal calculi or hydronephrosis. A Foley catheter is seen within an empty urinary bladder. Stomach/Bowel: Stomach is within normal limits. Appendix appears normal. Stool is seen throughout the large bowel. No evidence of bowel wall thickening, distention, or inflammatory changes. Vascular/Lymphatic: Aortic atherosclerosis. No enlarged abdominal or pelvic lymph nodes. Reproductive: Uterus and bilateral adnexa are unremarkable. Other: No abdominal wall hernia or abnormality. No abdominopelvic ascites. Musculoskeletal: No acute or significant osseous findings. IMPRESSION: 1. No acute or active process within the abdomen or pelvis. 2. Areas of renal cortical thinning and cortical scarring without renal calculi or hydronephrosis. 3. Aortic atherosclerosis. Aortic Atherosclerosis (ICD10-I70.0). Electronically Signed   By: Aram Candela M.D.   On: 04/04/2022 03:20   DG Chest 1 View  Result Date: 04/04/2022 CLINICAL DATA:  Chest pain EXAM: CHEST  1 VIEW COMPARISON:  10/30/2021 FINDINGS: The heart size and mediastinal contours are within normal limits. Both lungs are clear. The visualized skeletal structures are  unremarkable. IMPRESSION: No active disease. Electronically Signed   By: Alcide Clever M.D.   On: 04/04/2022 01:09     PROCEDURES:  Critical Care performed: No  .1-3 Lead EKG Interpretation  Performed by: Irean Hong, MD Authorized by: Irean Hong, MD  Interpretation: normal     ECG rate:  70   ECG rate assessment: normal     Rhythm: sinus rhythm     Ectopy: none     Conduction: normal   Comments:     Patient placed on cardiac monitor to evaluate for arrhythmias    MEDICATIONS ORDERED IN ED: Medications  morphine (PF) 4 MG/ML injection 4 mg (4 mg Intravenous Given 04/04/22 0155)  ondansetron (ZOFRAN) injection 4 mg (4 mg Intravenous Given 04/04/22 0156)  sodium chloride 0.9 % bolus 1,000 mL (1,000 mLs Intravenous New Bag/Given 04/04/22 0159)  iohexol (OMNIPAQUE) 350 MG/ML injection 125 mL (125 mLs Intravenous Contrast Given 04/04/22 0251)     IMPRESSION / MDM / ASSESSMENT AND PLAN / ED COURSE  I reviewed the triage vital signs and the nursing notes.                             56 year old female presenting with generalized abdominal pain. Differential diagnosis includes, but is not limited to, ovarian cyst, ovarian torsion, acute appendicitis, diverticulitis, urinary tract infection/pyelonephritis, endometriosis, bowel obstruction, colitis, renal colic, gastroenteritis, hernia, fibroids, etc. I have personally reviewed patient's records and note her neurosurgery office visit on 03/29/2022 for cervical spinal cord injury.  Patient's presentation is most consistent with acute presentation with potential threat to life or bodily function.  The patient is on the cardiac monitor to evaluate for evidence of arrhythmia and/or significant heart rate changes.  We will obtain lab work, CT scan.  Initiate IV fluid resuscitation, IV morphine for pain paired with IV Zofran for nausea.  Will reassess.  Clinical Course as of 04/04/22 0345  Wed Apr 04, 2022  0259 Laboratory results  demonstrate normal WBC 7.1, normal electrolytes, troponin.  Lactic acid and procalcitonin both negative.  UA from Foley catheter bag with moderate leukocytes; will send for culture.  COVID swab and chest x-ray both negative.  Patient awaiting CT scan. [JS]  [id number] Updated patient on negative CT scan.  Patient drinking ginger ale without difficulty.  Currently only given Senokot for bowel movements.  Will place on bowel regimen including lactulose, as needed Bentyl/Zofran and patient will follow-up with her PCP closely.  Strict return precautions given.  Patient verbalizes understanding and agrees with plan of care. [JS]    Clinical Course User Index [JS] Paulette Blanch, MD     FINAL CLINICAL IMPRESSION(S) / ED DIAGNOSES   Final diagnoses:  Generalized abdominal pain  Constipation, unspecified constipation type     Rx / DC Orders   ED Discharge Orders     None        Note:  This document was prepared using Dragon voice recognition software and may include unintentional dictation errors.   Paulette Blanch, MD 04/04/22 [phone]

## 2022-04-04 NOTE — ED Triage Notes (Signed)
Pt arrived via [facility] from [facility], pt has had c/o abd pain that started on Tuesday and progressively worse during the day. Denies any vomiting c/o nausea

## 2022-04-07 LAB — URINE CULTURE: Culture: >=100000 — AB

## 2022-04-08 NOTE — Progress Notes (Signed)
ED Antimicrobial Stewardship Positive Culture Follow Up   Wendy Nelson is an 56 y.o. female who presented to [facility] on 04/04/2022 with a chief complaint of  Chief Complaint  Patient presents with   Abdominal Pain    Recent Results (from the past 720 hour(s))  Culture, blood (routine x 2)     Status: None (Preliminary result)   Collection Time: 04/04/22  1:53 AM   Specimen: BLOOD  Result Value Ref Range Status   Specimen Description BLOOD LEFT FOREARM  Final   Special Requests   Final    BOTTLES DRAWN AEROBIC AND ANAEROBIC Blood Culture adequate volume   Culture   Final    NO GROWTH 4 DAYS Performed at [facility], [address] [place], [place] [address]    Report Status PENDING  Incomplete  Urine Culture     Status: Abnormal   Collection Time: 04/04/22  2:01 AM   Specimen: Urine, Catheterized  Result Value Ref Range Status   Specimen Description   Final    URINE, CATHETERIZED Performed at [facility], [address] [place], [place] [address]    Special Requests   Final    NONE Performed at [facility], [address] [place], [place] [address]    Culture (A)  Final    >=100,000 COLONIES/mL ESCHERICHIA COLI 80,000 COLONIES/mL KLEBSIELLA PNEUMONIAE 50,000 COLONIES/mL PSEUDOMONAS AERUGINOSA    Report Status 04/07/2022 FINAL  Final   Organism ID, Bacteria ESCHERICHIA COLI (A)  Final   Organism ID, Bacteria KLEBSIELLA PNEUMONIAE (A)  Final   Organism ID, Bacteria PSEUDOMONAS AERUGINOSA (A)  Final      Susceptibility   Escherichia coli - MIC*    AMPICILLIN 4 SENSITIVE Sensitive     CEFAZOLIN <=4 SENSITIVE Sensitive     CEFEPIME <=0.12 SENSITIVE Sensitive     CEFTRIAXONE <=0.25 SENSITIVE Sensitive     CIPROFLOXACIN <=0.25 SENSITIVE Sensitive     GENTAMICIN <=1 SENSITIVE Sensitive     IMIPENEM <=0.25 SENSITIVE Sensitive     NITROFURANTOIN <=16 SENSITIVE Sensitive     TRIMETH/SULFA <=20 SENSITIVE Sensitive     AMPICILLIN/SULBACTAM  <=2 SENSITIVE Sensitive     PIP/TAZO <=4 SENSITIVE Sensitive     * >=100,000 COLONIES/mL ESCHERICHIA COLI   Klebsiella pneumoniae - MIC*    AMPICILLIN RESISTANT Resistant     CEFAZOLIN <=4 SENSITIVE Sensitive     CEFEPIME <=0.12 SENSITIVE Sensitive     CEFTRIAXONE <=0.25 SENSITIVE Sensitive     CIPROFLOXACIN <=0.25 SENSITIVE Sensitive     GENTAMICIN <=1 SENSITIVE Sensitive     IMIPENEM <=0.25 SENSITIVE Sensitive     NITROFURANTOIN 128 RESISTANT Resistant     TRIMETH/SULFA <=20 SENSITIVE Sensitive     AMPICILLIN/SULBACTAM <=2 SENSITIVE Sensitive     PIP/TAZO <=4 SENSITIVE Sensitive     * 80,000 COLONIES/mL KLEBSIELLA PNEUMONIAE   Pseudomonas aeruginosa - MIC*    CEFTAZIDIME 2 SENSITIVE Sensitive     CIPROFLOXACIN <=0.25 SENSITIVE Sensitive     GENTAMICIN <=1 SENSITIVE Sensitive     IMIPENEM <=0.25 SENSITIVE Sensitive     PIP/TAZO 8 SENSITIVE Sensitive     CEFEPIME 1 SENSITIVE Sensitive     * 50,000 COLONIES/mL PSEUDOMONAS AERUGINOSA  SARS Coronavirus 2 by RT PCR (hospital order, performed in [facility] lab) *cepheid single result test* Urine, Catheterized     Status: None   Collection Time: 04/04/22  2:01 AM   Specimen: Urine, Catheterized; Nasal Swab  Result Value Ref Range Status  SARS Coronavirus 2 by RT PCR NEGATIVE NEGATIVE Final    Comment: (NOTE) SARS-CoV-2 target nucleic acids are NOT DETECTED.  The SARS-CoV-2 RNA is generally detectable in upper and lower respiratory specimens during the acute phase of infection. The lowest concentration of SARS-CoV-2 viral copies this assay can detect is 250 copies / mL. A negative result does not preclude SARS-CoV-2 infection and should not be used as the sole basis for treatment or other patient management decisions.  A negative result may occur with improper specimen collection / handling, submission of specimen other than nasopharyngeal swab, presence of viral mutation(s) within the areas targeted by this assay, and  inadequate number of viral copies (<250 copies / mL). A negative result must be combined with clinical observations, patient history, and epidemiological information.  Fact Sheet for Patients:   https://www.patel.info/  Fact Sheet for Healthcare Providers: https://hall.com/  This test is not yet approved or  cleared by the [place] FDA and has been authorized for detection and/or diagnosis of SARS-CoV-2 by FDA under an Emergency Use Authorization (EUA).  This EUA will remain in effect (meaning this test can be used) for the duration of the COVID-19 declaration under Section 564(b)(1) of the Act, 21 U.S.C. section 360bbb-3(b)(1), unless the authorization is terminated or revoked sooner.  Performed at [facility], [address], [place], [place] [address]     Patient w/ indwelling foley cath, quadriplegia, neurogenic bladder from [facility]. WBC 7.1  Afebrile. c/o abd pain  Hx of chronic bacterial colonization per urology office visit notes (Dr, Erlene Quan)  Messaged Dr. Hollice Espy to make aware of pt ED visit and culture results. She agrees that no antibiotics are needed at this time   Vonna Brabson A 04/08/2022, 2:28 PM Clinical Pharmacist

## 2022-04-09 LAB — CULTURE, BLOOD (ROUTINE X 2)
Culture: NO GROWTH
Special Requests: ADEQUATE

## 2022-08-08 IMAGING — MR MR HEAD W/O CM
11 series · 48 of 48 positions shown · non-contrast
Comparison: None.

CLINICAL DATA: Transient ischemic attack

EXAM:
MRI HEAD WITHOUT CONTRAST
TECHNIQUE: Multiplanar, multiecho pulse sequences of the brain and surrounding
structures were obtained without intravenous contrast.

[Series 5: ax dwi_tracew · axial · 3.0mm · 0.65mm/px · z∈[-135,+20]mm · 4 of 48 slices shown]
[im 1/48]
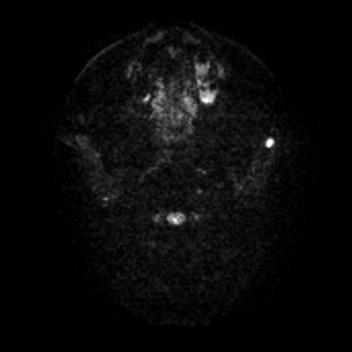
[im 16/48]
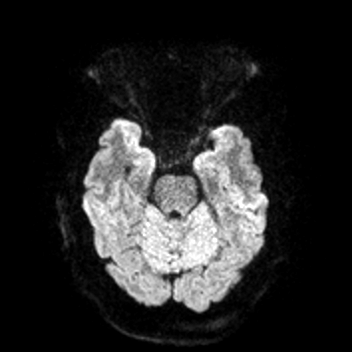
[im 32/48]
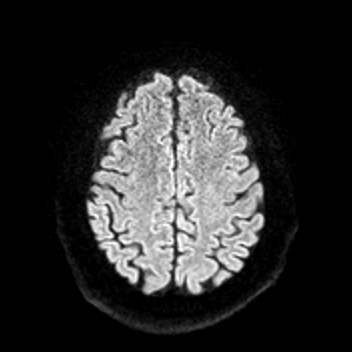
[im 48/48]
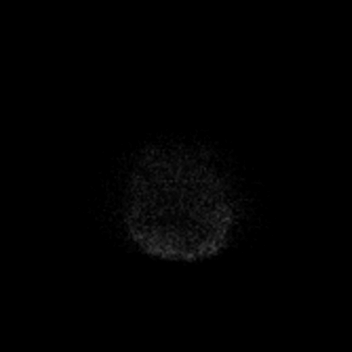

[Series 6: ax dwi_adc · axial · 3.0mm · 0.65mm/px · z∈[-135,+20]mm · 4 of 48 slices shown]
[im 1/48]
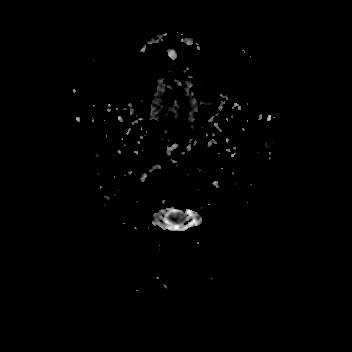
[im 16/48]
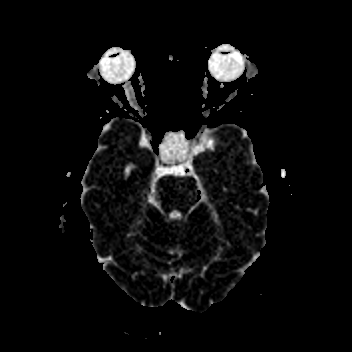
[im 32/48]
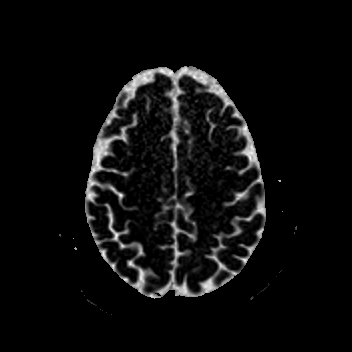
[im 48/48]
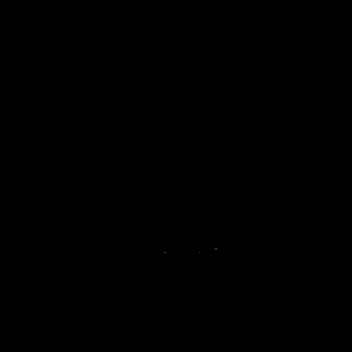

[Series 7: cor dwi_tracew · coronal · 5.0mm · 0.65mm/px · 3 of 40 slices shown]
[im 1/40]
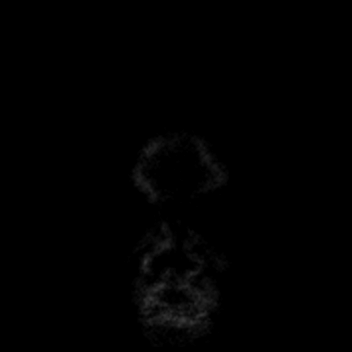
[im 20/40]
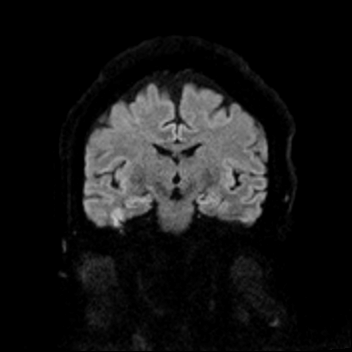
[im 40/40]
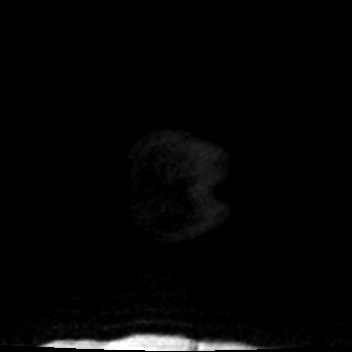

[Series 8: cor dwi_adc · coronal · 5.0mm · 0.65mm/px · 3 of 39 slices shown]
[im 1/39]
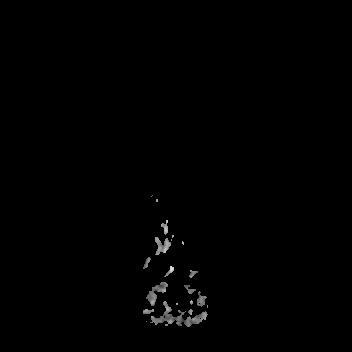
[im 20/39]
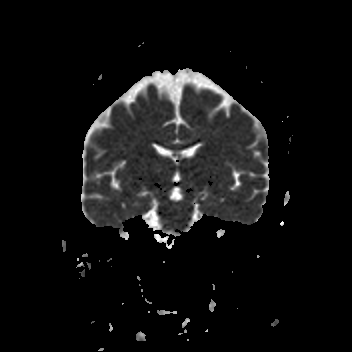
[im 39/39]
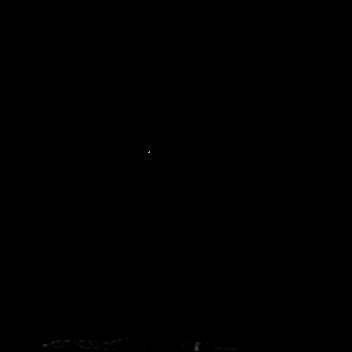

[Series 9: T1 · sagittal · 5.0mm · 0.62mm/px · 2 of 25 slices shown (1 of 2)]
[im 1/25]
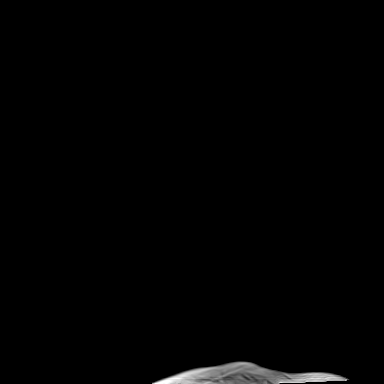
[im 25/25]
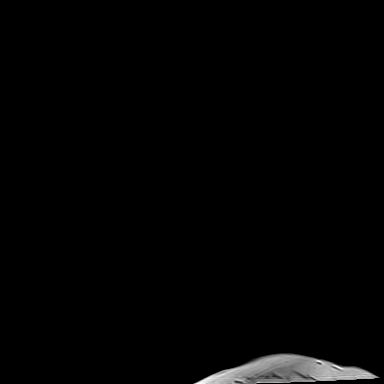

[Series 10: T2 · axial · 5.0mm · 0.53mm/px · z∈[-135,+21]mm · 2 of 27 slices shown (1 of 2)]
[im 1/27]
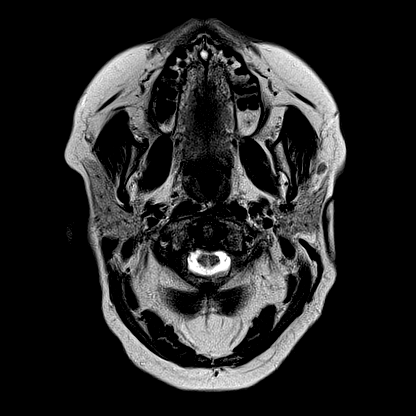
[im 27/27]
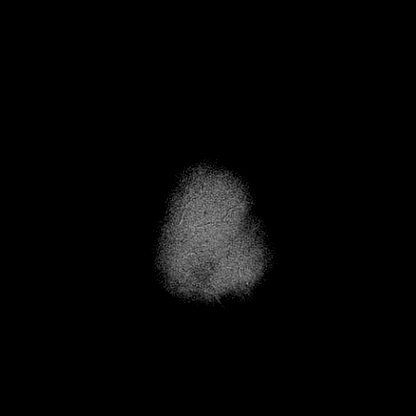

[Series 12: pha_images · axial · 3.0mm · 0.90mm/px · z∈[-146,+31]mm · 5 of 60 slices shown]
[im 1/60]
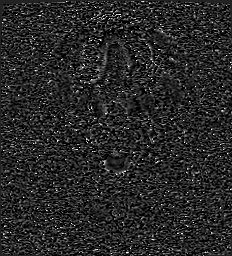
[im 15/60]
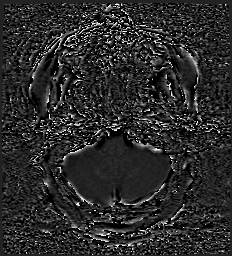
[im 30/60]
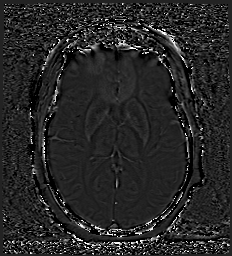
[im 45/60]
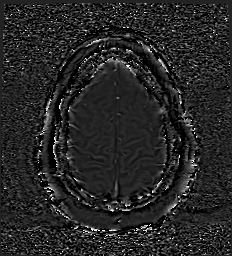
[im 60/60]
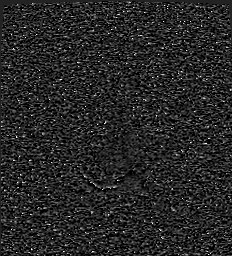

[Series 13: swi_images · axial · 3.0mm · 0.90mm/px · z∈[-146,+31]mm · 5 of 60 slices shown]
[im 1/60]
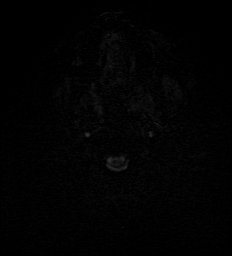
[im 15/60]
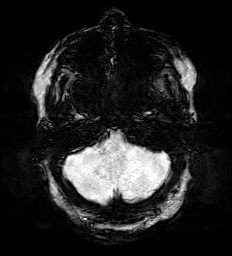
[im 30/60]
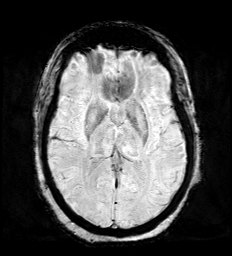
[im 45/60]
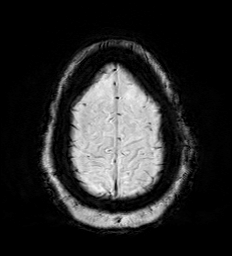
[im 60/60]
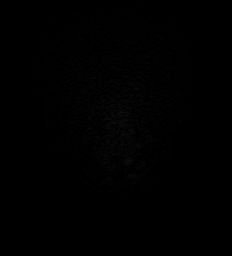

[Series 15: FLAIR · axial · 3.0mm · 0.53mm/px · z∈[-138,+24]mm · 4 of 55 slices shown]
[im 1/55]
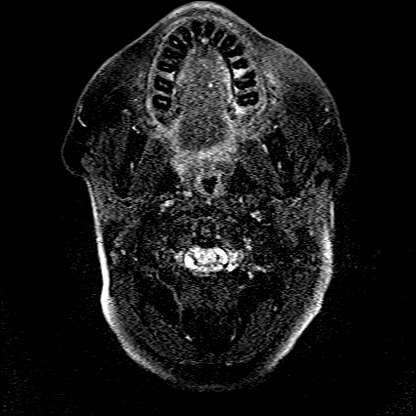
[im 19/55]
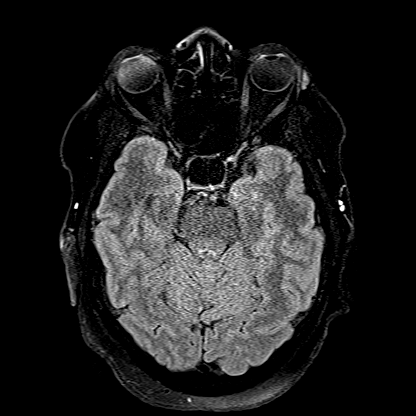
[im 37/55]
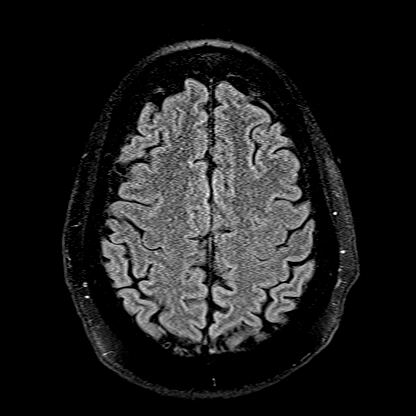
[im 55/55]
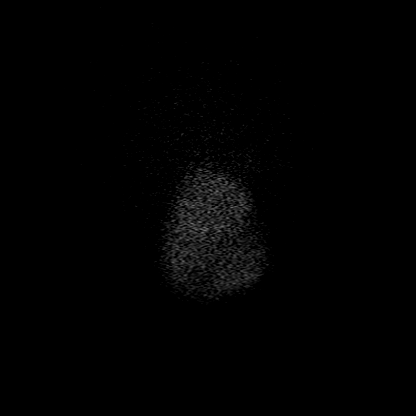

[Series 16: T1 · axial · 1.0mm · 0.98mm/px · z∈[-145,+29]mm · 14 of 176 slices shown (2 of 2)]
[im 1/176]
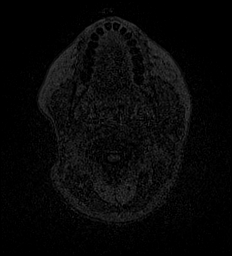
[im 14/176]
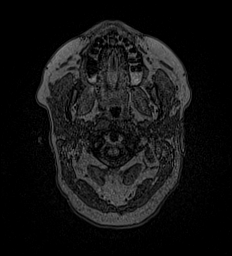
[im 27/176]
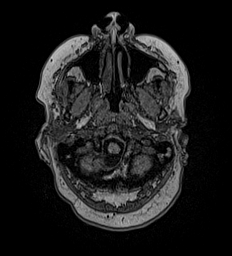
[im 41/176]
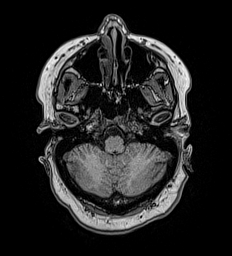
[im 54/176]
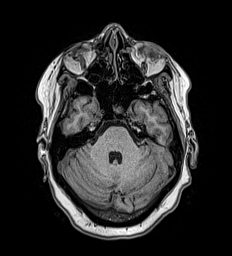
[im 68/176]
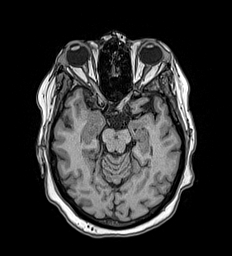
[im 81/176]
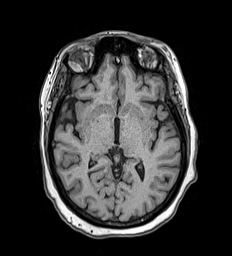
[im 95/176]
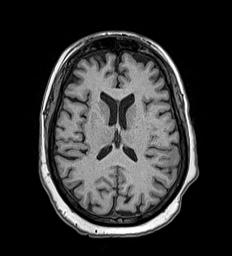
[im 108/176]
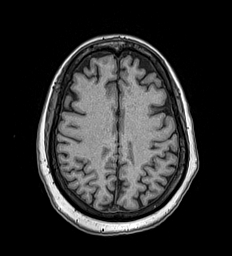
[im 122/176]
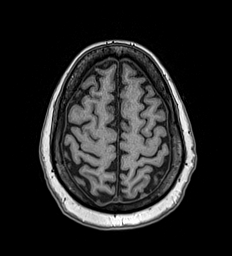
[im 135/176]
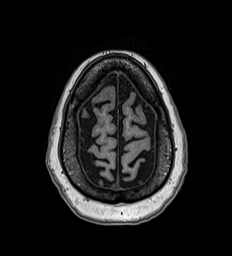
[im 149/176]
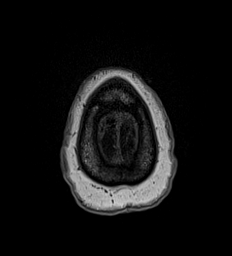
[im 162/176]
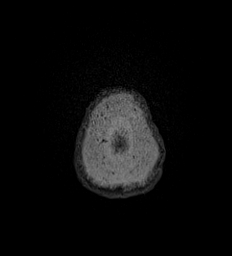
[im 176/176]
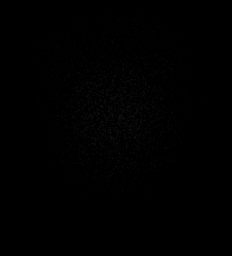

[Series 17: T2 · coronal · 5.0mm · 0.57mm/px · 2 of 31 slices shown (2 of 2)]
[im 1/31]
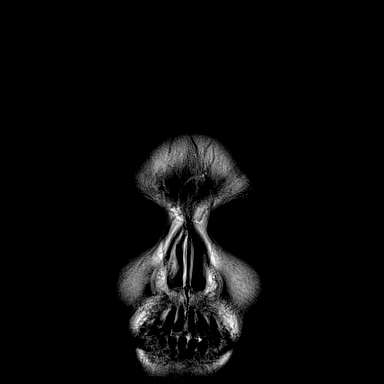
[im 31/31]
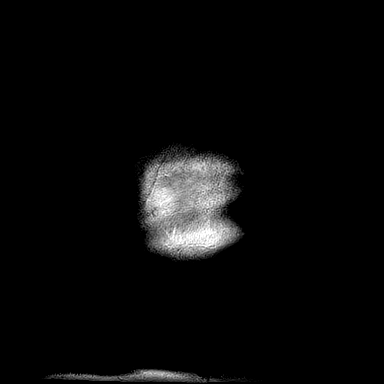

[48 of 48 positions shown; findings below may reference images not displayed]

FINDINGS: Brain: No acute infarct, mass effect or extra-axial collection. No
acute or chronic hemorrhage. Normal white matter signal, parenchymal
volume and CSF spaces. A partially empty sella is incidentally
noted.

Vascular: Major flow voids are preserved.

Skull and upper cervical spine: Normal calvarium and skull base.
Visualized upper cervical spine and soft tissues are normal.

Sinuses/Orbits:No paranasal sinus fluid levels or advanced mucosal
thickening. No mastoid or middle ear effusion. Normal orbits.
IMPRESSION: Normal brain MRI.

## 2022-08-08 IMAGING — CT CT HEAD W/O CM
3 series · 16 of 47 positions shown, 19 images · non-contrast
Comparison: 10/19/2020

CLINICAL DATA: Weakness.  History of prior paralysis.

EXAM:
CT HEAD WITHOUT CONTRAST
TECHNIQUE: Contiguous axial images were obtained from the base of the skull
through the vertex without intravenous contrast.

[Series 3: head wo · axial · 0.41mm/px · z∈[-109,+16]mm · 10 of 30 slices shown, 13 images]
[im 3/30  brain]
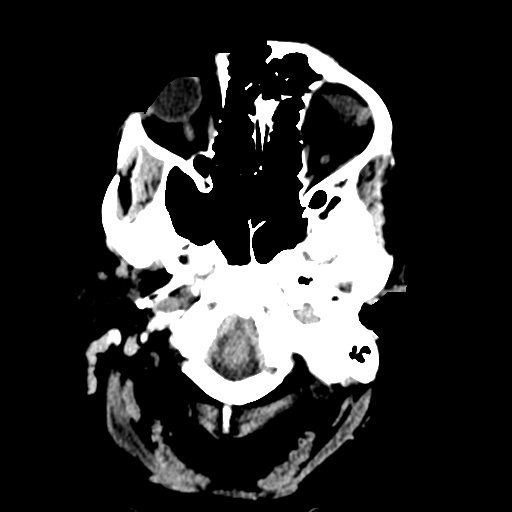
[im 3/30  bone]
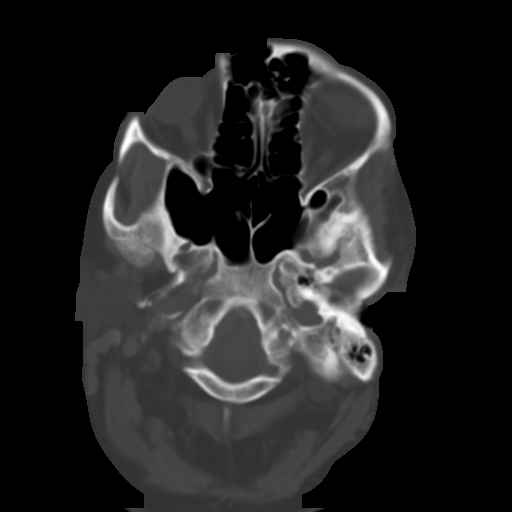
[im 6/30  brain]
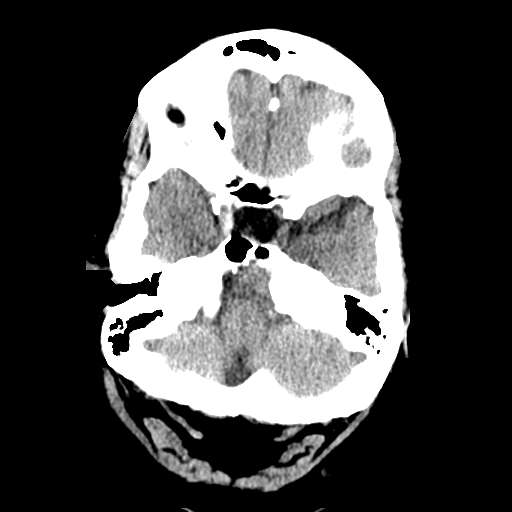
[im 9/30  brain]
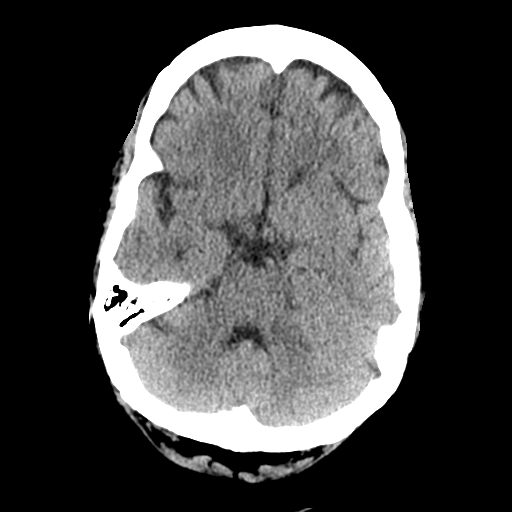
[im 11/30  brain]
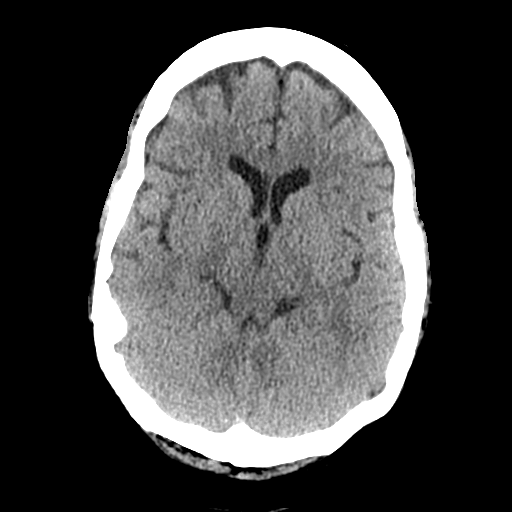
[im 14/30  brain]
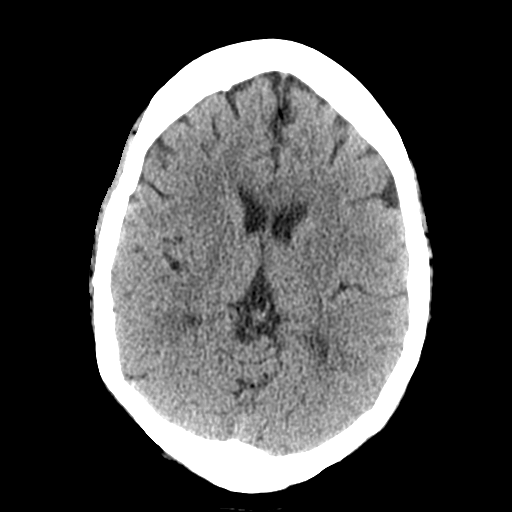
[im 14/30  bone]
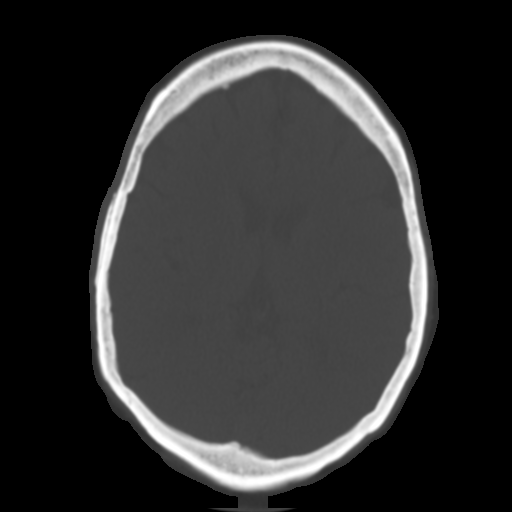
[im 17/30  brain]
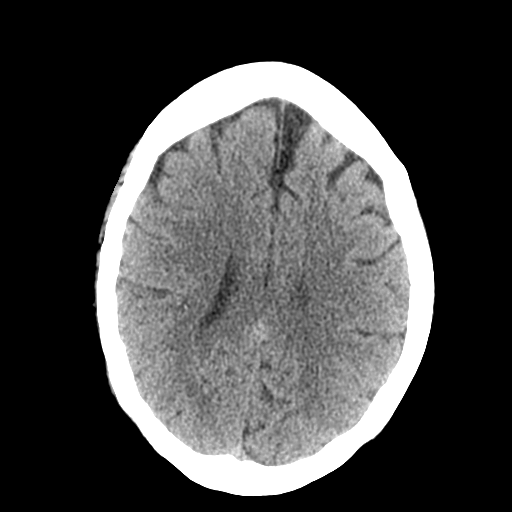
[im 20/30  brain]
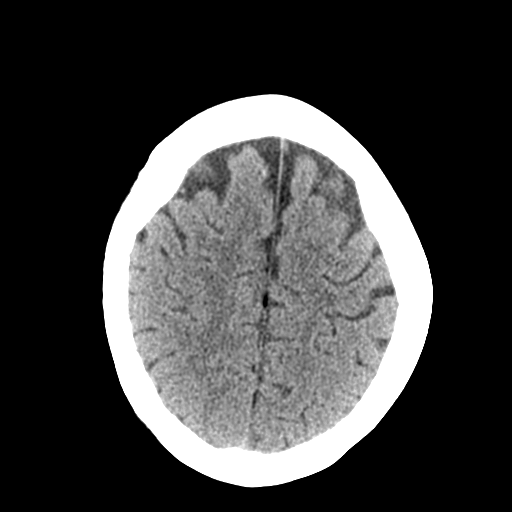
[im 23/30  brain]
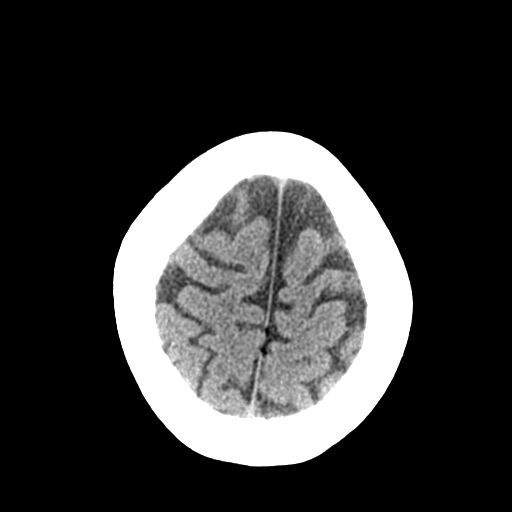
[im 25/30  brain]
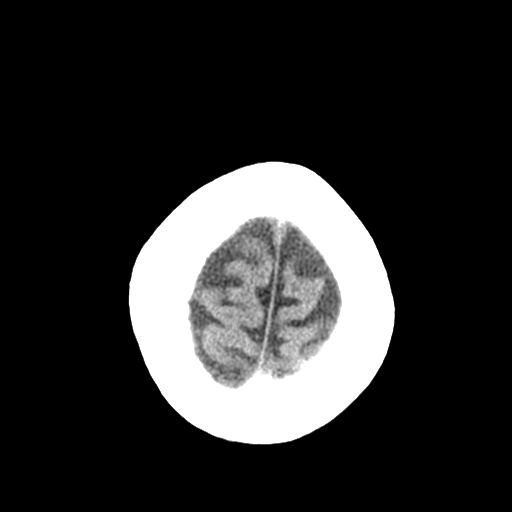
[im 25/30  bone]
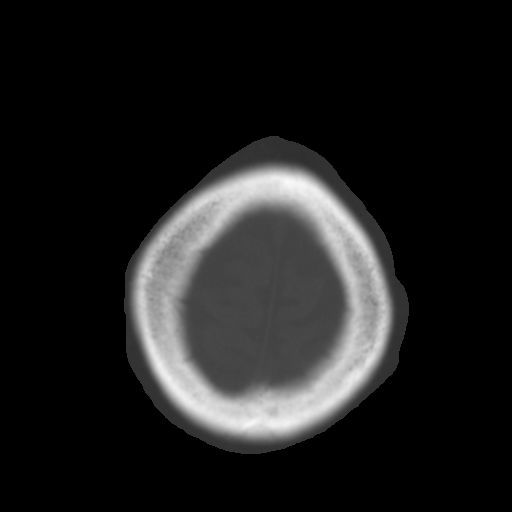
[im 28/30  brain]
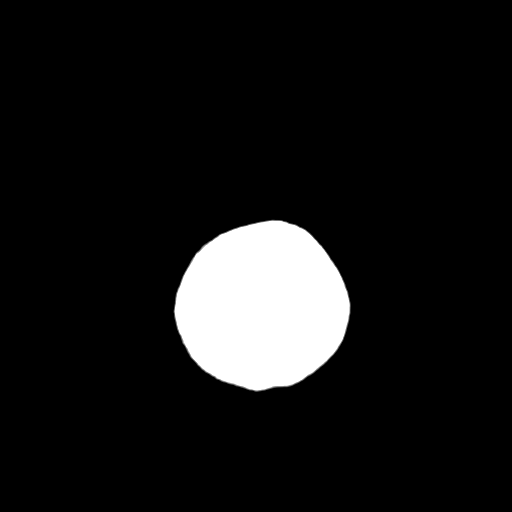

[Series 4: coronal soft tissue · coronal · 0.33mm/px · 3 of 68 slices shown]
[im 23/68  brain]
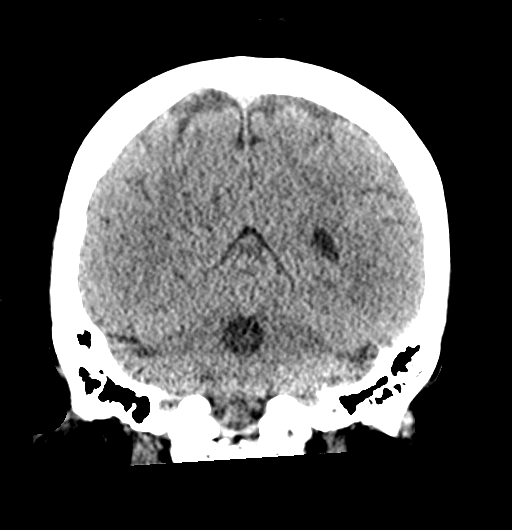
[im 30/68  brain]
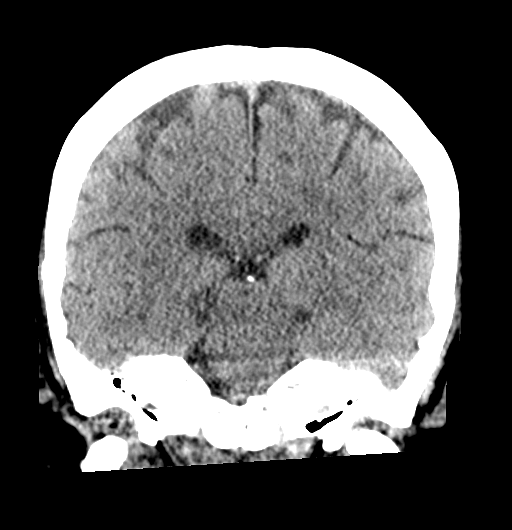
[im 38/68  brain]
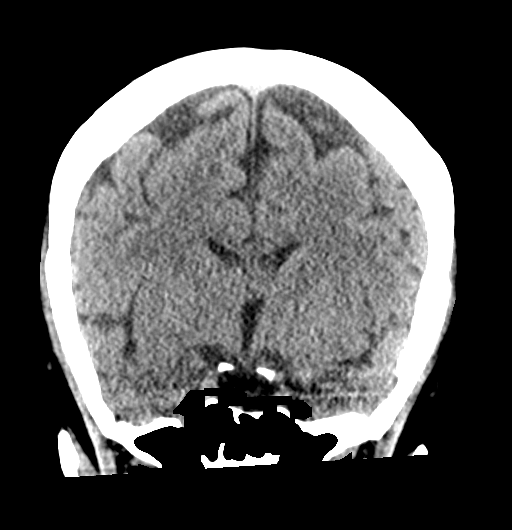

[Series 5: sagittal soft tissue · sagittal · 0.35mm/px · 3 of 58 slices shown]
[im 20/58  brain]
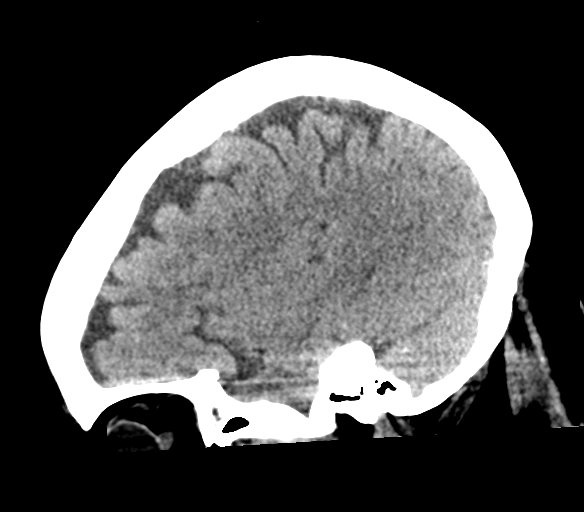
[im 29/58  brain]
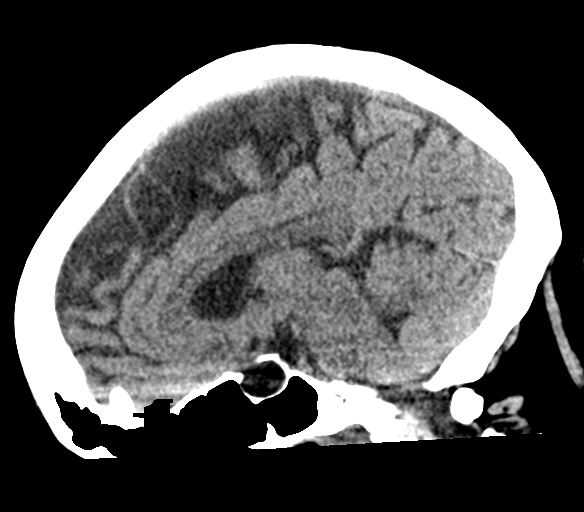
[im 39/58  brain]
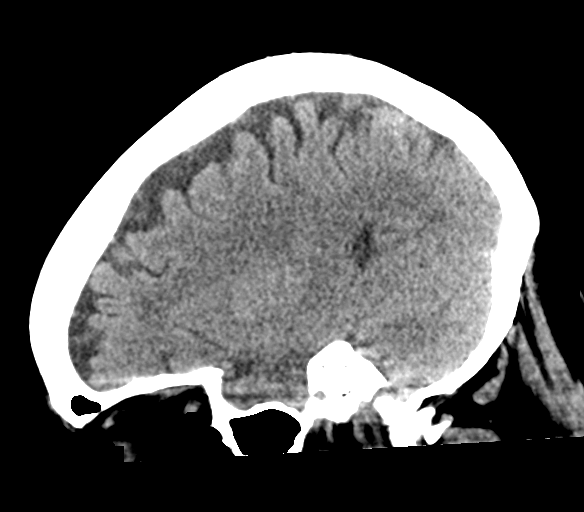

[16 of 47 positions shown; findings below may reference images not displayed]

FINDINGS: Brain: The brainstem, cerebellum, cerebral peduncles, thalami, basal
ganglia, basilar cisterns, and ventricular system appear within
normal limits. Partially empty sella. No intracranial hemorrhage,
mass lesion, or acute CVA.

Vascular: Unremarkable

Skull: Unremarkable

Sinuses/Orbits: Unremarkable

Other: No supplemental non-categorized findings.
IMPRESSION: 1. No acute intracranial findings.
2. Chronic appearance of partially empty sella.

## 2022-09-12 ENCOUNTER — Emergency Department: Payer: Medicare Other

## 2022-09-12 ENCOUNTER — Emergency Department
Admission: EM | Admit: 2022-09-12 | Discharge: 2022-09-12 | Disposition: A | Discharge status: Skilled Nursing Facility | Payer: Medicare Other | Attending: Emergency Medicine | Admitting: Emergency Medicine

## 2022-09-12 DIAGNOSIS — Z1152 Encounter for screening for COVID-19: Secondary | ICD-10-CM | POA: Diagnosis not present

## 2022-09-12 DIAGNOSIS — R531 Weakness: Secondary | ICD-10-CM

## 2022-09-12 DIAGNOSIS — R0602 Shortness of breath: Secondary | ICD-10-CM | POA: Insufficient documentation

## 2022-09-12 DIAGNOSIS — N390 Urinary tract infection, site not specified: Secondary | ICD-10-CM | POA: Insufficient documentation

## 2022-09-12 DIAGNOSIS — Y732 Prosthetic and other implants, materials and accessory gastroenterology and urology devices associated with adverse incidents: Secondary | ICD-10-CM | POA: Diagnosis not present

## 2022-09-12 DIAGNOSIS — T83511A Infection and inflammatory reaction due to indwelling urethral catheter, initial encounter: Secondary | ICD-10-CM | POA: Insufficient documentation

## 2022-09-12 DIAGNOSIS — Z7901 Long term (current) use of anticoagulants: Secondary | ICD-10-CM | POA: Insufficient documentation

## 2022-09-12 LAB — RESP PANEL BY RT-PCR (RSV, FLU A&B, COVID)  RVPGX2
Influenza A by PCR: NEGATIVE
Influenza B by PCR: NEGATIVE
Resp Syncytial Virus by PCR: NEGATIVE
SARS Coronavirus 2 by RT PCR: NEGATIVE

## 2022-09-12 LAB — COMPREHENSIVE METABOLIC PANEL
ALT: 14 U/L (ref 0–44)
AST: 15 U/L (ref 15–41)
Albumin: 3.2 g/dL — ABNORMAL LOW (ref 3.5–5.0)
Alkaline Phosphatase: 145 U/L — ABNORMAL HIGH (ref 38–126)
Anion gap: 9 (ref 5–15)
BUN: 22 mg/dL — ABNORMAL HIGH (ref 6–20)
CO2: 29 mmol/L (ref 22–32)
Calcium: 8.7 mg/dL — ABNORMAL LOW (ref 8.9–10.3)
Chloride: 102 mmol/L (ref 98–111)
Creatinine, Ser: 1.14 mg/dL — ABNORMAL HIGH (ref 0.44–1.00)
GFR, Estimated: 57 mL/min — ABNORMAL LOW (ref >60)
Glucose, Bld: 130 mg/dL — ABNORMAL HIGH (ref 70–99)
Potassium: 3.9 mmol/L (ref 3.5–5.1)
Sodium: 140 mmol/L (ref 135–145)
Total Bilirubin: 0.6 mg/dL (ref 0.3–1.2)
Total Protein: 7.6 g/dL (ref 6.5–8.1)

## 2022-09-12 LAB — URINALYSIS, ROUTINE W REFLEX MICROSCOPIC
Bilirubin Urine: NEGATIVE
Glucose, UA: NEGATIVE mg/dL
Ketones, ur: NEGATIVE mg/dL
Nitrite: NEGATIVE
Protein, ur: >=300 mg/dL — AB
RBC / HPF: >50 RBC/hpf (ref 0–5)
Specific Gravity, Urine: 1.01 (ref 1.005–1.030)
WBC, UA: >50 WBC/hpf (ref 0–5)
pH: 6 (ref 5.0–8.0)

## 2022-09-12 LAB — CBC WITH DIFFERENTIAL/PLATELET
Abs Immature Granulocytes: 0.03 10*3/uL (ref 0.00–0.07)
Basophils Absolute: 0.1 10*3/uL (ref 0.0–0.1)
Basophils Relative: 1 %
Eosinophils Absolute: 0.3 10*3/uL (ref 0.0–0.5)
Eosinophils Relative: 4 %
HCT: 40.1 % (ref 36.0–46.0)
Hemoglobin: 12.3 g/dL (ref 12.0–15.0)
Immature Granulocytes: 0 %
Lymphocytes Relative: 34 %
Lymphs Abs: 3.2 10*3/uL (ref 0.7–4.0)
MCH: 27 pg (ref 26.0–34.0)
MCHC: 30.7 g/dL (ref 30.0–36.0)
MCV: 87.9 fL (ref 80.0–100.0)
Monocytes Absolute: 0.5 10*3/uL (ref 0.1–1.0)
Monocytes Relative: 5 %
Neutro Abs: 5.3 10*3/uL (ref 1.7–7.7)
Neutrophils Relative %: 56 %
Platelets: 281 10*3/uL (ref 150–400)
RBC: 4.56 MIL/uL (ref 3.87–5.11)
RDW: 15.9 % — ABNORMAL HIGH (ref 11.5–15.5)
WBC: 9.4 10*3/uL (ref 4.0–10.5)
nRBC: 0 % (ref 0.0–0.2)

## 2022-09-12 LAB — TROPONIN I (HIGH SENSITIVITY)
Troponin I (High Sensitivity): 5 ng/L (ref <18)
Troponin I (High Sensitivity): 3 ng/L (ref <18)

## 2022-09-12 MED ORDER — CEFDINIR 300 MG PO CAPS: 300.0000 mg | ORAL_CAPSULE | Freq: Two times a day (BID) | ORAL | 0 refills | Status: AC

## 2022-09-12 MED ORDER — SODIUM CHLORIDE 0.9 % IV SOLN
2.0000 g | Freq: Once | INTRAVENOUS | Status: AC
  Administered 2022-09-12: 2 g via INTRAVENOUS
  Filled 2022-09-12: qty 20

## 2022-09-12 MED ORDER — SODIUM CHLORIDE 0.9 % IV BOLUS
1000.0000 mL | Freq: Once | INTRAVENOUS | Status: AC
  Administered 2022-09-12: 1000 mL via INTRAVENOUS

## 2022-09-12 NOTE — ED Triage Notes (Signed)
From [facility] via [facility] with c/o shortness of breath, weakness, and nausea. Facility reports sob resolved prior to EMS arrival. Hematuria reported by staff. Pt reports Foley cath changed out at [facility] and she started feeling bad apx 30 mins afterward

## 2022-09-12 NOTE — ED Provider Notes (Signed)
[facility] Provider Note    Event Date/Time   First MD Initiated Contact with Patient 09/12/22 0033     (approximate)   History   Weakness   HPI  Wendy Nelson is a 57 y.o. female who presents to the ED for evaluation of Weakness   I reviewed neurology clinic visit from 1/31.  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  Anticoagulated on Eliquis.  Resides at a local SNF.  Indwelling Foley catheter.  She reports the catheter was just replaced this evening.  She presents to the ED from her local SNF by EMS for evaluation of "feeling awful."  She reports earlier tonight she had a sensation that she was going to die and called out for help.  She reports similar but better now but is uncertain what happened.  She reports she was feeling weak and short of breath.   She is emphatic that her urinary catheter was just exchanged just prior to her coming here.  Earlier this evening in the past couple hours  Physical Exam   Triage Vital Signs: ED Triage Vitals  Enc Vitals Group     BP --      Pulse --      Resp --      Temp 09/12/22 0030 97.7 F (36.5 C)     Temp Source 09/12/22 0030 Oral     SpO2 09/12/22 0030 94 %     Weight 09/12/22 0029 270 lb (122.5 kg)     Height 09/12/22 0029 '5\' 5"'$  (1.651 m)     Head Circumference --      [facility] --      Pain Score 09/12/22 0029 0     Pain Loc --      Pain Edu? --      Excl. in [place]? --     Most recent vital signs: Vitals:   09/12/22 0030 09/12/22 0039  BP:  (!) 156/77  Pulse:  76  Resp:  18  Temp: 97.7 F (36.5 C)   SpO2: 94%     General: Awake, no distress.  CV:  Good peripheral perfusion.  Resp:  Normal effort.  Abd:  No distention.  MSK:  No deformity noted.  Neuro:  No acute deficits appreciated. Other:     ED Results / Procedures / Treatments   Labs (all labs ordered are listed, but only abnormal results are displayed) Labs Reviewed   CBC WITH DIFFERENTIAL/PLATELET - Abnormal; Notable for the following components:      Result Value   RDW 15.9 (*)    All other components within normal limits  COMPREHENSIVE METABOLIC PANEL - Abnormal; Notable for the following components:   Glucose, Bld 130 (*)    BUN 22 (*)    Creatinine, Ser 1.14 (*)    Calcium 8.7 (*)    Albumin 3.2 (*)    Alkaline Phosphatase 145 (*)    GFR, Estimated 57 (*)    All other components within normal limits  URINALYSIS, ROUTINE W REFLEX MICROSCOPIC - Abnormal; Notable for the following components:   Color, Urine YELLOW (*)    APPearance CLOUDY (*)    Hgb urine dipstick MODERATE (*)    Protein, ur >=300 (*)    Leukocytes,Ua LARGE (*)    Bacteria, UA FEW (*)    All other components within normal limits  RESP PANEL BY RT-PCR (RSV, FLU A&B, COVID)  [id number]  URINE CULTURE  TROPONIN I (HIGH SENSITIVITY)  TROPONIN I (HIGH SENSITIVITY)    EKG Sinus rhythm with a rate of 83 bpm.  Normal axis.  QTc 505.  No STEMI.  RADIOLOGY CXR interpreted by me without evidence of acute cardiopulmonary pathology.  Official radiology report(s): DG Chest Portable 1 View  Result Date: 09/12/2022 CLINICAL DATA:  Dyspnea EXAM: PORTABLE CHEST 1 VIEW COMPARISON:  04/04/2022 FINDINGS: Stable bibasilar parenchymal scarring. Lungs are otherwise clear. No pneumothorax or pleural effusion. Stable mild cardiomegaly. Pulmonary vascularity is normal. No acute bone abnormality. IMPRESSION: 1. No active disease.  Stable cardiomegaly. Electronically Signed   By: Fidela Salisbury M.D.   On: 09/12/2022 01:37    PROCEDURES and INTERVENTIONS:  .1-3 Lead EKG Interpretation  Performed by: Vladimir Crofts, MD Authorized by: Vladimir Crofts, MD     Interpretation: normal     ECG rate:  70   ECG rate assessment: normal     Rhythm: sinus rhythm     Ectopy: none     Conduction: normal     Medications  cefTRIAXone (ROCEPHIN) 2 g in sodium chloride 0.9 % 100 mL IVPB (0 g Intravenous Stopped  09/12/22 0254)  sodium chloride 0.9 % bolus 1,000 mL (0 mLs Intravenous Stopped 09/12/22 0254)     IMPRESSION / MDM / ASSESSMENT AND PLAN / ED COURSE  I reviewed the triage vital signs and the nursing notes.  Differential diagnosis includes, but is not limited to, sepsis, UTI, pneumonia, electrolyte derangement, AKI, viral syndrome  {Patient presents with symptoms of an acute illness or injury that is potentially life-threatening.  Bedbound quadriplegic presents with a few hours of generalized weakness and sensation of doom, possibly related to a UTI that is suitable for outpatient management with the initiation of antibiotics.  She looks systemically well and reports being asymptomatic by the time she arrives.  Denies any pain, pressure.  EKG is not clearly ischemic and she has 2 negative troponins.  Doubt ACS.  She reports her urinary catheter was just changed this evening and it does look quite clean/clear.  Urine from this is concerning for a UTI.  Will send this for culture and start her on a third-generation cephalosporin.  No sepsis and she has a normal CBC.  Metabolic panel with CKD around baseline.  After receiving IV fluids and antibiotics, because her for about 3 hours, she still looks well.  Reports feeling better.  Will discharge back to her SNF with a continued course of antibiotics  Clinical Course as of 09/12/22 0317  Wed Sep 12, 2022  0054 reassessed [DS]  0213 Reassessed and educated patient of urine results.  We discussed plan of care. [DS]    Clinical Course User Index [DS] Vladimir Crofts, MD     FINAL CLINICAL IMPRESSION(S) / ED DIAGNOSES   Final diagnoses:  Generalized weakness  Lower urinary tract infectious disease  Urinary tract infection associated with indwelling urethral catheter, initial encounter ([place])     Rx / DC Orders   ED Discharge Orders          Ordered    cefdinir (OMNICEF) 300 MG capsule  2 times daily        09/12/22 0218              Note:  This document was prepared using Dragon voice recognition software and may include unintentional dictation errors.   Vladimir Crofts, MD 09/12/22 [phone]

## 2022-09-12 NOTE — Discharge Instructions (Signed)
Rushie has a UTI and has been started on antibiotics.  She received a dose of IV antibiotics that will last nearly 24 hours, please pick up the oral antibiotic prescription and start these the evening of 2/28.

## 2022-09-15 LAB — URINE CULTURE: Culture: 40000 — AB

## 2022-09-16 NOTE — Consult Note (Signed)
ED Antimicrobial Stewardship Positive Culture Follow Up   Wendy Nelson is an 57 y.o. female who presented to [facility] on 09/12/2022 with a chief complaint of  Chief Complaint  Patient presents with   Weakness    Recent Results (from the past 720 hour(s))  Resp panel by RT-PCR (RSV, Flu A&B, Covid) Anterior Nasal Swab     Status: None   Collection Time: 09/12/22 12:34 AM   Specimen: Anterior Nasal Swab  Result Value Ref Range Status   SARS Coronavirus 2 by RT PCR NEGATIVE NEGATIVE Final    Comment: (NOTE) SARS-CoV-2 target nucleic acids are NOT DETECTED.  The SARS-CoV-2 RNA is generally detectable in upper respiratory specimens during the acute phase of infection. The lowest concentration of SARS-CoV-2 viral copies this assay can detect is 138 copies/mL. A negative result does not preclude SARS-Cov-2 infection and should not be used as the sole basis for treatment or other patient management decisions. A negative result may occur with  improper specimen collection/handling, submission of specimen other than nasopharyngeal swab, presence of viral mutation(s) within the areas targeted by this assay, and inadequate number of viral copies(<138 copies/mL). A negative result must be combined with clinical observations, patient history, and epidemiological information. The expected result is Negative.  Fact Sheet for Patients:  EntrepreneurPulse.com.au  Fact Sheet for Healthcare Providers:  IncredibleEmployment.be  This test is no t yet approved or cleared by the [place] FDA and  has been authorized for detection and/or diagnosis of SARS-CoV-2 by FDA under an Emergency Use Authorization (EUA). This EUA will remain  in effect (meaning this test can be used) for the duration of the COVID-19 declaration under Section 564(b)(1) of the Act, 21 U.S.C.section 360bbb-3(b)(1), unless the authorization is terminated  or revoked sooner.        Influenza A by PCR NEGATIVE NEGATIVE Final   Influenza B by PCR NEGATIVE NEGATIVE Final    Comment: (NOTE) The Xpert Xpress SARS-CoV-2/FLU/RSV plus assay is intended as an aid in the diagnosis of influenza from Nasopharyngeal swab specimens and should not be used as a sole basis for treatment. Nasal washings and aspirates are unacceptable for Xpert Xpress SARS-CoV-2/FLU/RSV testing.  Fact Sheet for Patients: EntrepreneurPulse.com.au  Fact Sheet for Healthcare Providers: IncredibleEmployment.be  This test is not yet approved or cleared by the [place] FDA and has been authorized for detection and/or diagnosis of SARS-CoV-2 by FDA under an Emergency Use Authorization (EUA). This EUA will remain in effect (meaning this test can be used) for the duration of the COVID-19 declaration under Section 564(b)(1) of the Act, 21 U.S.C. section 360bbb-3(b)(1), unless the authorization is terminated or revoked.     Resp Syncytial Virus by PCR NEGATIVE NEGATIVE Final    Comment: (NOTE) Fact Sheet for Patients: EntrepreneurPulse.com.au  Fact Sheet for Healthcare Providers: IncredibleEmployment.be  This test is not yet approved or cleared by the [place] FDA and has been authorized for detection and/or diagnosis of SARS-CoV-2 by FDA under an Emergency Use Authorization (EUA). This EUA will remain in effect (meaning this test can be used) for the duration of the COVID-19 declaration under Section 564(b)(1) of the Act, 21 U.S.C. section 360bbb-3(b)(1), unless the authorization is terminated or revoked.  Performed at [facility], [address] [place], [place] [address]   Culture, Urine (Do not remove urinary catheter, catheter placed by urology or difficult to place)     Status: Abnormal   Collection Time: 09/12/22 12:34 AM   Specimen: Urine, Catheterized  Result Value Ref Range Status   Specimen  Description   Final    URINE, CATHETERIZED Performed at [facility], [address] [place], [place] [address]    Special Requests   Final    NONE Performed at [facility], [address] [place], [place] [address]    Culture (A)  Final    40,000 COLONIES/mL ENTEROCOCCUS FAECALIS VANCOMYCIN RESISTANT ENTEROCOCCUS    Report Status 09/15/2022 FINAL  Final   Organism ID, Bacteria ENTEROCOCCUS FAECALIS (A)  Final      Susceptibility   Enterococcus faecalis - MIC*    AMPICILLIN <=2 SENSITIVE Sensitive     NITROFURANTOIN <=16 SENSITIVE Sensitive     VANCOMYCIN >=32 RESISTANT Resistant     * 40,000 COLONIES/mL ENTEROCOCCUS FAECALIS    '[x]'$  Treated with cefdinir 300 mg BID x 7 days, organism resistant to prescribed antimicrobial  New antibiotic prescription: N/A Pt was in the ED on 3/28 presenting with weakness and SOB. Pt with indwelling Foley. Pt is quadriplegic so unable to assess symptoms. Due to UA's chronically dirty, MD does not recommend changing therapy to amoxicillin, instead recommending SNF monitor for fevers. I communicated this with SNF.  ED Provider: Arta Silence, MD  Will M. Ouida Sills, PharmD PGY-1 Pharmacy Resident 09/16/2022 2:24 PM

## 2022-11-29 ENCOUNTER — Ambulatory Visit: Payer: Medicare Other | Admitting: Physician Assistant

## 2022-12-04 NOTE — Progress Notes (Unsigned)
12/05/2022 4:14 PM   Wendy Nelson January 02, 1966 [id number]  Referring provider: [facility] [address] [place],  [place] [address]  No chief complaint on file.  Urological history 1. Neurogenic bladder -09/2020 cervical spondylosis with myelopathy and radiculopathy s/p C5-7 posterior cervical laminectomy and fusion with right sided foraminotomies at [facility] and developed postoperative cervical spinal cord infarct  -manage with an indwelling Foley  2. Bilateral hydronephrosis -secondary to urinary retention -resolved with indwelling Foley  3. Pyelonephrosis -seen on contrast CT 12/2020 -hospitalized 12/2020  4. High risk hematuria -non-smoker -CTU (08/2021) - no malignancies noted -cysto (2023) - NED   HPI: Wendy Nelson is a 57 y.o. female who presents today for brown sediment in urine.  CATH UA ***   PMH: Past Medical History:  Diagnosis Date   Quadriplegia [facility])    Stroke (HCC) 09/2020    Surgical History: Past Surgical History:  Procedure Laterality Date   CERVICAL SPINE SURGERY     JOINT REPLACEMENT Right    Knee    Home Medications:  Allergies as of 12/05/2022       Reactions   Melatonin    Neomycin Other (See Comments)   Positive patch test   Sulfa Antibiotics Itching        Medication List        Accurate as of Dec 04, 2022  4:14 PM. If you have any questions, ask your nurse or doctor.          acamprosate 333 MG tablet Commonly known as: CAMPRAL Take 333 mg by mouth 3 (three) times daily.   Acetaminophen 500 MG capsule Take 1,000 mg by mouth every 6 (six) hours as needed.   ascorbic acid 500 MG tablet Commonly known as: VITAMIN C Take 500 mg by mouth 2 (two) times daily.   aspirin 81 MG chewable tablet Chew 81 mg by mouth daily.   atorvastatin 10 MG tablet Commonly known as: LIPITOR Take 10 mg by mouth daily.   bisacodyl 10 MG suppository Commonly known as: DULCOLAX Place 1 suppository (10 mg total) rectally daily  as needed for moderate constipation.   Cheratussin AC 100-10 MG/5ML syrup Generic drug: guaiFENesin-codeine   DermaPhor Oint Apply 1 application. topically daily. To feet.   dicyclomine 20 MG tablet Commonly known as: BENTYL Take 1 tablet (20 mg total) by mouth every 6 (six) hours.   DULoxetine 60 MG capsule Commonly known as: CYMBALTA Take 60 mg by mouth daily.   DULoxetine 30 MG capsule Commonly known as: CYMBALTA Take 30 mg by mouth daily.   Eliquis 5 MG Tabs tablet Generic drug: apixaban Take 5 mg by mouth every other day.   Ergocalciferol 50 MCG (2000 UT) Tabs Take 2,000 Units by mouth daily.   fluticasone 50 MCG/ACT nasal spray Commonly known as: FLONASE Place 1 spray into both nostrils daily.   ipratropium-albuterol 0.5-2.5 (3) MG/3ML Soln Commonly known as: DUONEB Take 3 mLs by nebulization every 4 (four) hours as needed.   lactulose 10 GM/15ML solution Commonly known as: CHRONULAC Take 30 mLs (20 g total) by mouth daily as needed for mild constipation.   lidocaine 5 % Commonly known as: LIDODERM Place 1 patch onto the skin daily. Remove & Discard patch within 12 hours or as directed by MD   loratadine 10 MG tablet Commonly known as: CLARITIN Take 10 mg by mouth daily.   methocarbamol 500 MG tablet Commonly known as: ROBAXIN Take 1 tablet (500 mg total) by mouth every 6 (  six) hours as needed for muscle spasms.   multivitamin tablet Take 1 tablet by mouth daily.   nystatin powder Commonly known as: MYCOSTATIN/NYSTOP Apply topically 2 (two) times daily.   omeprazole 20 MG capsule Commonly known as: PRILOSEC Take 20 mg by mouth daily.   ondansetron 4 MG disintegrating tablet Commonly known as: ZOFRAN-ODT Take 1 tablet (4 mg total) by mouth every 8 (eight) hours as needed for nausea or vomiting.   ondansetron 4 MG tablet Commonly known as: ZOFRAN Take 4 mg by mouth every 6 (six) hours as needed for nausea.   oxyCODONE 5 MG immediate release  tablet Commonly known as: Oxy IR/ROXICODONE Take 1 tablet (5 mg total) by mouth every 4 (four) hours as needed for severe pain.   Polyethylene Glycol 400 1 % Soln Place 2 drops into both eyes daily.   pregabalin 150 MG capsule Commonly known as: LYRICA Take 150 mg by mouth 2 (two) times daily.   senna-docusate 8.6-50 MG tablet Commonly known as: Senokot-S Take 1 tablet by mouth daily.   tiZANidine 2 MG tablet Commonly known as: ZANAFLEX Take 2 mg by mouth 2 (two) times daily.   traZODone 50 MG tablet Commonly known as: DESYREL Take 50 mg by mouth at bedtime.        Allergies:  Allergies  Allergen Reactions   Melatonin    Neomycin Other (See Comments)    Positive patch test   Sulfa Antibiotics Itching    Family History: Family History  Problem Relation Age of Onset   Diabetes Mellitus II Mother    Hypertension Mother    Diabetes Mellitus II Brother     Social History:  reports that she has never smoked. She has never used smokeless tobacco. No history on file for alcohol use and drug use.  ROS: Pertinent ROS in HPI  Physical Exam: There were no vitals taken for this visit.  Constitutional:  Well nourished. Alert and oriented, No acute distress. HEENT: [place] AT, moist mucus membranes.  Trachea midline, no masses. Cardiovascular: No clubbing, cyanosis, or edema. Respiratory: Normal respiratory effort, no increased work of breathing. GU: No CVA tenderness.  No bladder fullness or masses. Vulvovaginal atrophy w/ pallor, loss of rugae, introital retraction, excoriations.  Vulvar thinning, fusion of labia, clitoral hood retraction, prominent urethral meatus.   *** external genitalia, *** pubic hair distribution, no lesions.  Normal urethral meatus, no lesions, no prolapse, no discharge.   No urethral masses, tenderness and/or tenderness. No bladder fullness, tenderness or masses. *** vagina mucosa, *** estrogen effect, no discharge, no lesions, *** pelvic support, ***  cystocele and *** rectocele noted.  No cervical motion tenderness.  Uterus is freely mobile and non-fixed.  No adnexal/parametria masses or tenderness noted.  Anus and perineum are without rashes or lesions.   ***  Neurologic: Grossly intact, no focal deficits, moving all 4 extremities. Psychiatric: Normal mood and affect.    Laboratory Data: Lab Results  Component Value Date   WBC 9.4 09/12/2022   HGB 12.3 09/12/2022   HCT 40.1 09/12/2022   MCV 87.9 09/12/2022   PLT 281 09/12/2022   Lab Results  Component Value Date   CREATININE 1.14 (H) 09/12/2022   Lab Results  Component Value Date   AST 15 09/12/2022   Lab Results  Component Value Date   ALT 14 09/12/2022   Urinalysis *** I have reviewed the labs.   Pertinent Imaging: N/A  Assessment & Plan:    1. Neurogenic bladder -RUS and  cysto in 09/2021 with Dr. Apolinar Junes  2. Gross hematuria -She states that the gross hematuria has returned again -Urine in the tube is yellow today -We will contact [facility] and obtain records -We may need to pursue a CT urogram versus renal ultrasound with cystoscopy pending record  No follow-ups on file.  These notes generated with voice recognition software. I apologize for typographical errors.  Cloretta Ned  [facility] [address]  [address] [place], [place] [address] [phone]

## 2022-12-05 ENCOUNTER — Encounter: Payer: Self-pay | Admitting: Urology

## 2022-12-05 ENCOUNTER — Ambulatory Visit (INDEPENDENT_AMBULATORY_CARE_PROVIDER_SITE_OTHER): Payer: 59 | Admitting: Urology

## 2022-12-05 VITALS — BP 122/64 | HR 71 | Wt 296.2 lb

## 2022-12-05 DIAGNOSIS — R339 Retention of urine, unspecified: Secondary | ICD-10-CM | POA: Diagnosis not present

## 2022-12-05 DIAGNOSIS — N319 Neuromuscular dysfunction of bladder, unspecified: Secondary | ICD-10-CM

## 2022-12-05 DIAGNOSIS — R310 Gross hematuria: Secondary | ICD-10-CM

## 2022-12-05 DIAGNOSIS — R8290 Unspecified abnormal findings in urine: Secondary | ICD-10-CM

## 2022-12-05 LAB — MICROSCOPIC EXAMINATION
RBC, Urine: >30 /hpf — AB (ref 0–2)
WBC, UA: >30 /hpf — AB (ref 0–5)

## 2022-12-05 LAB — URINALYSIS, COMPLETE
Bilirubin, UA: NEGATIVE
Glucose, UA: NEGATIVE
Ketones, UA: NEGATIVE
Nitrite, UA: NEGATIVE
Specific Gravity, UA: 1.025 (ref 1.005–1.030)
Urobilinogen, Ur: 2 mg/dL — ABNORMAL HIGH (ref 0.2–1.0)
pH, UA: 7 (ref 5.0–7.5)

## 2022-12-07 LAB — CULTURE, URINE COMPREHENSIVE

## 2022-12-19 ENCOUNTER — Ambulatory Visit (INDEPENDENT_AMBULATORY_CARE_PROVIDER_SITE_OTHER): Payer: 59 | Admitting: Urology

## 2022-12-19 ENCOUNTER — Encounter: Payer: Self-pay | Admitting: Urology

## 2022-12-19 DIAGNOSIS — R310 Gross hematuria: Secondary | ICD-10-CM

## 2022-12-19 DIAGNOSIS — N319 Neuromuscular dysfunction of bladder, unspecified: Secondary | ICD-10-CM

## 2022-12-19 DIAGNOSIS — N361 Urethral diverticulum: Secondary | ICD-10-CM

## 2022-12-19 DIAGNOSIS — R82998 Other abnormal findings in urine: Secondary | ICD-10-CM

## 2022-12-19 NOTE — Progress Notes (Signed)
12/05/2022 1:17 PM   Wendy Nelson Jun 16, 1966 [id number]  Referring provider: [facility] [address] [place],  [place] [address]  Chief Complaint  Patient presents with   Neurogenic Bladder   Urological history 1. Neurogenic bladder -09/2020 cervical spondylosis with myelopathy and radiculopathy s/p C5-7 posterior cervical laminectomy and fusion with right sided foraminotomies at [facility] and developed postoperative cervical spinal cord infarct  -manage with an indwelling Foley  2. Bilateral hydronephrosis -secondary to urinary retention -resolved with indwelling Foley  3. Pyelonephrosis -seen on contrast CT 12/2020 -hospitalized 12/2020  4. High risk hematuria -non-smoker -CTU (2023) - no malignancies noted -cysto (2023) - NED  HPI: Wendy Nelson is a 57 y.o. female who presents today for CATH UA after previous urine culture resulted with greater than 3 organisms.  At her visit on 12/05/2022, she has been having some white-colored debris in the tubing for several weeks and more recently brown color debris in the tubing for the last week.  Her staff is also shown her her depends and they have had blood in them.  Wendy Nelson states it look like she had her period.  She has not seen frank blood in the catheter.  Her urine is clear yellow today, but she states exchange the catheter last night.  Patient denies any modifying or aggravating factors.  Patient denies any gross hematuria, dysuria or suprapubic/flank pain.  Patient denies any fevers, chills, nausea or vomiting.   CATH UA yellow cloudy, specific gravity 1.025, 3+ blood, pH 7.0, 3+ protein, 2 urobilinogen, +1 leukocytes, greater than 30 WBCs, greater than 30 RBCs, 0-10 epithelial cells, mucus threads are present with many bacteria.  Urine culture grew out > 3 organisms.    She has not seen any further episodes of blood in her depends or gross hematuria since her last visit with [place].  Patient denies any modifying or  aggravating factors.  Patient denies any gross hematuria.  Patient denies any fevers, chills, nausea or vomiting.    The catheter that we put in on her visit on Dec 05, 2022 fell out a few days later and staff replaced it.    CATH UA yellow cloudy, specific gravity 1.020, 3+ blood, pH 7.0, 3+ protein, nitrate positive, 3+ leukocyte, greater than 30 WBCs, greater than 30 RBCs, 0-2 epithelial cells, mucus threads are present, many bacteria.  PMH: Past Medical History:  Diagnosis Date   Quadriplegia [facility])    Stroke (HCC) 09/2020    Surgical History: Past Surgical History:  Procedure Laterality Date   CERVICAL SPINE SURGERY     JOINT REPLACEMENT Right    Knee    Home Medications:  Allergies as of 12/05/2022       Reactions   Melatonin    Misc. Sulfonamide Containing Compounds    Neomycin Other (See Comments)   Positive patch test   Sulfa Antibiotics Itching        Medication List        Accurate as of Dec 05, 2022  1:17 PM. If you have any questions, ask your nurse or doctor.          STOP taking these medications    acamprosate 333 MG tablet Commonly known as: CAMPRAL   ascorbic acid 500 MG tablet Commonly known as: VITAMIN C   Cheratussin AC 100-10 MG/5ML syrup Generic drug: guaiFENesin-codeine   dicyclomine 20 MG tablet Commonly known as: BENTYL   tiZANidine 2 MG tablet Commonly known as: ZANAFLEX  TAKE these medications    Acetaminophen 500 MG capsule Take 1,000 mg by mouth every 6 (six) hours as needed.   aspirin 81 MG chewable tablet Chew 81 mg by mouth daily.   atorvastatin 10 MG tablet Commonly known as: LIPITOR Take 10 mg by mouth daily.   benzonatate 100 MG capsule Commonly known as: TESSALON Take 100 mg by mouth daily as needed for cough.   bisacodyl 10 MG suppository Commonly known as: DULCOLAX Place 1 suppository (10 mg total) rectally daily as needed for moderate constipation.   CRANBERRY PO Take by mouth daily.    D-Mannose 500 MG Caps Take by mouth daily. AZO   DermaPhor Oint Apply 1 application. topically daily. To feet.   dextromethorphan-guaiFENesin 30-600 MG 12hr tablet Commonly known as: MUCINEX DM Take 1 tablet by mouth every 12 (twelve) hours.   DULoxetine 60 MG capsule Commonly known as: CYMBALTA Take 60 mg by mouth daily.   DULoxetine 30 MG capsule Commonly known as: CYMBALTA Take 30 mg by mouth daily.   Eliquis 5 MG Tabs tablet Generic drug: apixaban Take 5 mg by mouth every other day.   Ergocalciferol 50 MCG (2000 UT) Tabs Take 2 tablets by mouth daily. What changed: Another medication with the same name was removed. Continue taking this medication, and follow the directions you see here.   fluticasone 50 MCG/ACT nasal spray Commonly known as: FLONASE Place 1 spray into both nostrils daily. What changed: Another medication with the same name was removed. Continue taking this medication, and follow the directions you see here.   ipratropium-albuterol 0.5-2.5 (3) MG/3ML Soln Commonly known as: DUONEB Take 3 mLs by nebulization every 4 (four) hours as needed.   lactulose 10 GM/15ML solution Commonly known as: CHRONULAC Take 30 mLs (20 g total) by mouth daily as needed for mild constipation.   lidocaine 4 % cream Commonly known as: LMX Apply 1 Application topically daily. What changed: Another medication with the same name was removed. Continue taking this medication, and follow the directions you see here.   Lidocaine HCl 4 % Soln Apply 4 mLs topically daily.   Linzess 290 MCG Caps capsule Generic drug: linaclotide Take 290 mcg by mouth daily before breakfast.   loratadine 10 MG tablet Commonly known as: CLARITIN Take 10 mg by mouth daily.   meclizine 25 MG tablet Commonly known as: ANTIVERT Take 25 mg by mouth daily as needed.   methocarbamol 500 MG tablet Commonly known as: ROBAXIN Take 1 tablet (500 mg total) by mouth every 6 (six) hours as needed for  muscle spasms.   multivitamin tablet Take 1 tablet by mouth daily.   naloxone 4 MG/0.1ML Liqd nasal spray kit Commonly known as: NARCAN Place 1 spray into the nose once.   nystatin powder Commonly known as: MYCOSTATIN/NYSTOP Apply topically 2 (two) times daily.   omeprazole 40 MG capsule Commonly known as: PRILOSEC Take 40 mg by mouth daily. What changed: Another medication with the same name was removed. Continue taking this medication, and follow the directions you see here.   ondansetron 4 MG disintegrating tablet Commonly known as: ZOFRAN-ODT Take 1 tablet (4 mg total) by mouth every 8 (eight) hours as needed for nausea or vomiting.   ondansetron 4 MG tablet Commonly known as: ZOFRAN Take 4 mg by mouth every 6 (six) hours as needed for nausea.   Oral Relief For Dry Mouth Lozg by Transmucosal route.   oxyCODONE 5 MG immediate release tablet Commonly known as: Oxy IR/ROXICODONE  Take 5 mg by mouth every 4 (four) hours as needed for moderate pain. What changed: Another medication with the same name was removed. Continue taking this medication, and follow the directions you see here.   polyethylene glycol powder 17 GM/SCOOP powder Commonly known as: GLYCOLAX/MIRALAX Take 1 Container by mouth daily.   pregabalin 150 MG capsule Commonly known as: LYRICA Take 150 mg by mouth 2 (two) times daily.   senna-docusate 8.6-50 MG tablet Commonly known as: Senokot-S Take 1 tablet by mouth 2 (two) times daily. What changed: Another medication with the same name was removed. Continue taking this medication, and follow the directions you see here.   traZODone 50 MG tablet Commonly known as: DESYREL Take 50 mg by mouth at bedtime.   Visine Dry Eye Relief 1 % Soln Generic drug: Polyethylene Glycol 400 Apply 2 drops to eye 2 (two) times daily. What changed: Another medication with the same name was removed. Continue taking this medication, and follow the directions you see here.         Allergies:  Allergies  Allergen Reactions   Melatonin    Misc. Sulfonamide Containing Compounds    Neomycin Other (See Comments)    Positive patch test   Sulfa Antibiotics Itching    Family History: Family History  Problem Relation Age of Onset   Diabetes Mellitus II Mother    Hypertension Mother    Diabetes Mellitus II Brother     Social History:  reports that she has never smoked. She has never used smokeless tobacco. No history on file for alcohol use and drug use.  ROS: Pertinent ROS in HPI  Physical Exam: BP 122/64   Pulse 71   Wt 296 lb 3.2 oz (134.4 kg) Comment: per facility  BMI 49.29 kg/m   Constitutional:  Well nourished. Alert and oriented, No acute distress. HEENT: [place] AT, moist mucus membranes.  Trachea midline, no masses. Cardiovascular: No clubbing, cyanosis, or edema. Respiratory: Normal respiratory effort, no increased work of breathing. GU: No CVA tenderness.  No bladder fullness or masses.  Difficult to examine due to body habitus.  Vulvovaginal atrophy with pallor, loss of rugae and introital retraction is noted.  It is difficult to determine whether or not there is a urethral diverticulum versus cystocele versus rectocele present.  Anus and perineum without lesions or rashes.  Neurologic: Grossly intact, no focal deficits, moves upper extremities, non ambulatory in wheelchair.  Psychiatric: Normal mood and affect.    Laboratory Data: Lab Results  Component Value Date   WBC 9.4 09/12/2022   HGB 12.3 09/12/2022   HCT 40.1 09/12/2022   MCV 87.9 09/12/2022   PLT 281 09/12/2022   Lab Results  Component Value Date   CREATININE 1.14 (H) 09/12/2022   Lab Results  Component Value Date   AST 15 09/12/2022   Lab Results  Component Value Date   ALT 14 09/12/2022   Urinalysis  See EPIC and HPI I have reviewed the labs.   Pertinent Imaging: N/A  Cath Change/ Replacement Patient is present today for a catheter change due to urinary  retention.  14 ml of water was removed from the balloon, a 16 FR foley cath was removed without difficulty.  Patient was cleaned and prepped in a sterile fashion with betadine.  A 16 FR foley cath was replaced into the bladder, cloudy yellow urine is returned immediately, but the catheter cannot be advanced into the bladder without difficulty.  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  The balloon  was filled with 10ml of sterile water. A night bag was attached for drainage.  Patient was given proper instruction on catheter care.    Performed by: Emi Belfast CMA, Honor Loh CMA, Humberta Magallon-Mariche CMA and Wynona Dove, RN and Michiel Cowboy, PA-C   Assessment & Plan:    1. Blood in depends -I will schedule a pelvic MRI to evaluate for urethral diverticulum or any other pelvic pathology -UA with micro heme -urine culture is pending  2. Abnormal urine -UA with nitrite positive, pyuria, hematuria and bacteria -Urine is sent for culture  3.  Neurogenic bladder -Foley catheter exchanged today  Return for pending urine culture results .  These notes generated with voice recognition software. I apologize for typographical errors.  Cloretta Ned  [facility] [address]  [address] [place], [place] [address] [phone]

## 2022-12-20 LAB — URINALYSIS, COMPLETE
Bilirubin, UA: NEGATIVE
Glucose, UA: NEGATIVE
Ketones, UA: NEGATIVE
Nitrite, UA: POSITIVE — AB
Specific Gravity, UA: 1.02 (ref 1.005–1.030)
Urobilinogen, Ur: 1 mg/dL (ref 0.2–1.0)
pH, UA: 7 (ref 5.0–7.5)

## 2022-12-20 LAB — MICROSCOPIC EXAMINATION
RBC, Urine: >30 /hpf — AB (ref 0–2)
WBC, UA: >30 /hpf — AB (ref 0–5)

## 2022-12-22 LAB — CULTURE, URINE COMPREHENSIVE

## 2022-12-26 ENCOUNTER — Ambulatory Visit
Admission: RE | Admit: 2022-12-26 | Discharge: 2022-12-26 | Disposition: A | Discharge status: Home / Self Care | Payer: 59 | Source: Ambulatory Visit | Attending: Urology | Admitting: Urology

## 2022-12-26 DIAGNOSIS — N361 Urethral diverticulum: Secondary | ICD-10-CM | POA: Diagnosis not present

## 2022-12-26 MED ORDER — GADOBUTROL 1 MMOL/ML IV SOLN
10.0000 mL | Freq: Once | INTRAVENOUS | Status: AC | PRN
  Administered 2022-12-26: 10 mL via INTRAVENOUS

## 2022-12-28 ENCOUNTER — Other Ambulatory Visit: Payer: Medicaid Other

## 2023-01-01 ENCOUNTER — Ambulatory Visit: Payer: Medicare Other | Admitting: Urology

## 2023-01-08 ENCOUNTER — Emergency Department: Payer: 59

## 2023-01-08 ENCOUNTER — Other Ambulatory Visit: Payer: Self-pay

## 2023-01-08 ENCOUNTER — Encounter: Payer: Self-pay | Admitting: Emergency Medicine

## 2023-01-08 ENCOUNTER — Inpatient Hospital Stay
Admission: EM | Admit: 2023-01-08 | Discharge: 2023-01-12 | DRG: 698 | Disposition: A | Discharge status: Skilled Nursing Facility | Payer: 59 | Source: Skilled Nursing Facility | Attending: Internal Medicine | Admitting: Internal Medicine

## 2023-01-08 DIAGNOSIS — F32A Depression, unspecified: Secondary | ICD-10-CM | POA: Diagnosis present

## 2023-01-08 DIAGNOSIS — Z79899 Other long term (current) drug therapy: Secondary | ICD-10-CM | POA: Diagnosis not present

## 2023-01-08 DIAGNOSIS — G8254 Quadriplegia, C5-C7 incomplete: Secondary | ICD-10-CM | POA: Diagnosis present

## 2023-01-08 DIAGNOSIS — I2510 Atherosclerotic heart disease of native coronary artery without angina pectoris: Secondary | ICD-10-CM | POA: Diagnosis present

## 2023-01-08 DIAGNOSIS — Z833 Family history of diabetes mellitus: Secondary | ICD-10-CM | POA: Diagnosis not present

## 2023-01-08 DIAGNOSIS — I10 Essential (primary) hypertension: Secondary | ICD-10-CM | POA: Diagnosis present

## 2023-01-08 DIAGNOSIS — Z6841 Body Mass Index (BMI) 40.0 and over, adult: Secondary | ICD-10-CM | POA: Diagnosis not present

## 2023-01-08 DIAGNOSIS — G9782 Other postprocedural complications and disorders of nervous system: Secondary | ICD-10-CM | POA: Diagnosis present

## 2023-01-08 DIAGNOSIS — Z7982 Long term (current) use of aspirin: Secondary | ICD-10-CM

## 2023-01-08 DIAGNOSIS — E785 Hyperlipidemia, unspecified: Secondary | ICD-10-CM | POA: Diagnosis present

## 2023-01-08 DIAGNOSIS — R6884 Jaw pain: Secondary | ICD-10-CM | POA: Diagnosis present

## 2023-01-08 DIAGNOSIS — Z978 Presence of other specified devices: Secondary | ICD-10-CM

## 2023-01-08 DIAGNOSIS — F419 Anxiety disorder, unspecified: Secondary | ICD-10-CM | POA: Diagnosis present

## 2023-01-08 DIAGNOSIS — Z882 Allergy status to sulfonamides status: Secondary | ICD-10-CM

## 2023-01-08 DIAGNOSIS — Z8673 Personal history of transient ischemic attack (TIA), and cerebral infarction without residual deficits: Secondary | ICD-10-CM | POA: Diagnosis not present

## 2023-01-08 DIAGNOSIS — Z881 Allergy status to other antibiotic agents status: Secondary | ICD-10-CM | POA: Diagnosis not present

## 2023-01-08 DIAGNOSIS — N390 Urinary tract infection, site not specified: Secondary | ICD-10-CM | POA: Diagnosis present

## 2023-01-08 DIAGNOSIS — I2489 Other forms of acute ischemic heart disease: Secondary | ICD-10-CM | POA: Diagnosis present

## 2023-01-08 DIAGNOSIS — T83518A Infection and inflammatory reaction due to other urinary catheter, initial encounter: Secondary | ICD-10-CM | POA: Diagnosis present

## 2023-01-08 DIAGNOSIS — R519 Headache, unspecified: Secondary | ICD-10-CM | POA: Diagnosis present

## 2023-01-08 DIAGNOSIS — Z7901 Long term (current) use of anticoagulants: Secondary | ICD-10-CM | POA: Diagnosis not present

## 2023-01-08 DIAGNOSIS — A4151 Sepsis due to Escherichia coli [E. coli]: Secondary | ICD-10-CM | POA: Diagnosis present

## 2023-01-08 DIAGNOSIS — N3020 Other chronic cystitis without hematuria: Secondary | ICD-10-CM | POA: Diagnosis present

## 2023-01-08 DIAGNOSIS — I214 Non-ST elevation (NSTEMI) myocardial infarction: Principal | ICD-10-CM | POA: Diagnosis present

## 2023-01-08 DIAGNOSIS — N318 Other neuromuscular dysfunction of bladder: Secondary | ICD-10-CM | POA: Diagnosis present

## 2023-01-08 DIAGNOSIS — N3000 Acute cystitis without hematuria: Secondary | ICD-10-CM | POA: Diagnosis present

## 2023-01-08 DIAGNOSIS — Z8249 Family history of ischemic heart disease and other diseases of the circulatory system: Secondary | ICD-10-CM

## 2023-01-08 DIAGNOSIS — Z888 Allergy status to other drugs, medicaments and biological substances status: Secondary | ICD-10-CM

## 2023-01-08 DIAGNOSIS — F418 Other specified anxiety disorders: Secondary | ICD-10-CM | POA: Diagnosis present

## 2023-01-08 DIAGNOSIS — N319 Neuromuscular dysfunction of bladder, unspecified: Secondary | ICD-10-CM | POA: Diagnosis present

## 2023-01-08 LAB — BASIC METABOLIC PANEL
Anion gap: 10 (ref 5–15)
BUN: 14 mg/dL (ref 6–20)
CO2: 27 mmol/L (ref 22–32)
Calcium: 8.8 mg/dL — ABNORMAL LOW (ref 8.9–10.3)
Chloride: 101 mmol/L (ref 98–111)
Creatinine, Ser: 0.95 mg/dL (ref 0.44–1.00)
GFR, Estimated: >60 mL/min (ref >60)
Glucose, Bld: 120 mg/dL — ABNORMAL HIGH (ref 70–99)
Potassium: 3.8 mmol/L (ref 3.5–5.1)
Sodium: 138 mmol/L (ref 135–145)

## 2023-01-08 LAB — URINALYSIS, ROUTINE W REFLEX MICROSCOPIC
Bilirubin Urine: NEGATIVE
Glucose, UA: NEGATIVE mg/dL
Ketones, ur: NEGATIVE mg/dL
Nitrite: NEGATIVE
Protein, ur: 100 mg/dL — AB
RBC / HPF: >50 RBC/hpf (ref 0–5)
Specific Gravity, Urine: >1.046 — ABNORMAL HIGH (ref 1.005–1.030)
Squamous Epithelial / HPF: NONE SEEN /HPF (ref 0–5)
WBC, UA: >50 WBC/hpf (ref 0–5)
pH: 6 (ref 5.0–8.0)

## 2023-01-08 LAB — CBC
HCT: 45.2 % (ref 36.0–46.0)
Hemoglobin: 14.4 g/dL (ref 12.0–15.0)
MCH: 27.8 pg (ref 26.0–34.0)
MCHC: 31.9 g/dL (ref 30.0–36.0)
MCV: 87.3 fL (ref 80.0–100.0)
Platelets: 233 10*3/uL (ref 150–400)
RBC: 5.18 MIL/uL — ABNORMAL HIGH (ref 3.87–5.11)
RDW: 15.9 % — ABNORMAL HIGH (ref 11.5–15.5)
WBC: 11.3 10*3/uL — ABNORMAL HIGH (ref 4.0–10.5)
nRBC: 0 % (ref 0.0–0.2)

## 2023-01-08 LAB — TROPONIN I (HIGH SENSITIVITY)
Troponin I (High Sensitivity): 104 ng/L (ref <18)
Troponin I (High Sensitivity): 118 ng/L (ref <18)

## 2023-01-08 LAB — LACTIC ACID, PLASMA: Lactic Acid, Venous: 1.3 mmol/L (ref 0.5–1.9)

## 2023-01-08 MED ORDER — ONDANSETRON HCL 4 MG/2ML IJ SOLN
4.0000 mg | Freq: Four times a day (QID) | INTRAMUSCULAR | Status: DC | PRN
  Administered 2023-01-09 – 2023-01-12 (×3): 4 mg via INTRAVENOUS
  Filled 2023-01-08 (×3): qty 2

## 2023-01-08 MED ORDER — SODIUM CHLORIDE 0.9 % IV SOLN
1.0000 g | Freq: Once | INTRAVENOUS | Status: AC
  Administered 2023-01-08: 1 g via INTRAVENOUS
  Filled 2023-01-08: qty 10

## 2023-01-08 MED ORDER — SODIUM CHLORIDE 0.9 % IV BOLUS
1000.0000 mL | Freq: Once | INTRAVENOUS | Status: AC
  Administered 2023-01-08: 1000 mL via INTRAVENOUS

## 2023-01-08 MED ORDER — DULOXETINE HCL 30 MG PO CPEP
30.0000 mg | ORAL_CAPSULE | Freq: Every day | ORAL | Status: DC
  Administered 2023-01-09 – 2023-01-11 (×3): 30 mg via ORAL
  Filled 2023-01-08 (×4): qty 1

## 2023-01-08 MED ORDER — ATORVASTATIN CALCIUM 10 MG PO TABS
10.0000 mg | ORAL_TABLET | Freq: Every day | ORAL | Status: DC
  Administered 2023-01-09 – 2023-01-12 (×4): 10 mg via ORAL
  Filled 2023-01-08 (×4): qty 1

## 2023-01-08 MED ORDER — ASPIRIN 81 MG PO TBEC
81.0000 mg | DELAYED_RELEASE_TABLET | Freq: Every day | ORAL | Status: DC
  Administered 2023-01-09 – 2023-01-12 (×4): 81 mg via ORAL
  Filled 2023-01-08 (×4): qty 1

## 2023-01-08 MED ORDER — ASPIRIN 81 MG PO CHEW
324.0000 mg | CHEWABLE_TABLET | Freq: Once | ORAL | Status: AC
  Administered 2023-01-08: 324 mg via ORAL
  Filled 2023-01-08: qty 4

## 2023-01-08 MED ORDER — HEPARIN BOLUS VIA INFUSION
4000.0000 [IU] | Freq: Once | INTRAVENOUS | Status: AC
  Administered 2023-01-08: 4000 [IU] via INTRAVENOUS
  Filled 2023-01-08: qty 4000

## 2023-01-08 MED ORDER — ASPIRIN 81 MG PO CHEW: 81.0000 mg | CHEWABLE_TABLET | Freq: Every day | ORAL | Status: DC

## 2023-01-08 MED ORDER — ACETAMINOPHEN 325 MG PO TABS: 650.0000 mg | ORAL_TABLET | ORAL | Status: DC | PRN

## 2023-01-08 MED ORDER — IPRATROPIUM-ALBUTEROL 0.5-2.5 (3) MG/3ML IN SOLN: 3.0000 mL | RESPIRATORY_TRACT | Status: DC | PRN

## 2023-01-08 MED ORDER — TRAZODONE HCL 50 MG PO TABS
50.0000 mg | ORAL_TABLET | Freq: Every day | ORAL | Status: DC
  Administered 2023-01-09 – 2023-01-11 (×3): 50 mg via ORAL
  Filled 2023-01-08 (×4): qty 1

## 2023-01-08 MED ORDER — SODIUM CHLORIDE 0.9 % IV SOLN
2.0000 g | INTRAVENOUS | Status: DC
  Administered 2023-01-09 – 2023-01-10 (×2): 2 g via INTRAVENOUS
  Filled 2023-01-08 (×2): qty 20

## 2023-01-08 MED ORDER — LACTATED RINGERS IV SOLN
150.0000 mL/h | INTRAVENOUS | Status: DC
  Administered 2023-01-08 – 2023-01-09 (×2): 150 mL/h via INTRAVENOUS

## 2023-01-08 MED ORDER — NITROGLYCERIN 0.4 MG SL SUBL: 0.4000 mg | SUBLINGUAL_TABLET | SUBLINGUAL | Status: DC | PRN

## 2023-01-08 MED ORDER — MORPHINE SULFATE (PF) 2 MG/ML IV SOLN: 2.0000 mg | INTRAVENOUS | Status: DC | PRN

## 2023-01-08 MED ORDER — HYDROCODONE-ACETAMINOPHEN 5-325 MG PO TABS: 1.0000 | ORAL_TABLET | ORAL | Status: DC | PRN

## 2023-01-08 MED ORDER — HEPARIN (PORCINE) 25000 UT/250ML-% IV SOLN
1250.0000 [IU]/h | INTRAVENOUS | Status: DC
  Administered 2023-01-08: 1250 [IU]/h via INTRAVENOUS
  Filled 2023-01-08: qty 250

## 2023-01-08 MED ORDER — DULOXETINE HCL 30 MG PO CPEP
60.0000 mg | ORAL_CAPSULE | Freq: Every day | ORAL | Status: DC
  Administered 2023-01-09 – 2023-01-12 (×4): 60 mg via ORAL
  Filled 2023-01-08: qty 1
  Filled 2023-01-08 (×3): qty 2

## 2023-01-08 MED ORDER — IOHEXOL 350 MG/ML SOLN
125.0000 mL | Freq: Once | INTRAVENOUS | Status: AC | PRN
  Administered 2023-01-08: 125 mL via INTRAVENOUS

## 2023-01-08 MED ORDER — DIPHENHYDRAMINE HCL 50 MG/ML IJ SOLN
25.0000 mg | Freq: Once | INTRAMUSCULAR | Status: AC
  Administered 2023-01-08: 25 mg via INTRAVENOUS
  Filled 2023-01-08: qty 1

## 2023-01-08 MED ORDER — PROCHLORPERAZINE EDISYLATE 10 MG/2ML IJ SOLN
10.0000 mg | Freq: Once | INTRAMUSCULAR | Status: AC
  Administered 2023-01-08: 10 mg via INTRAVENOUS
  Filled 2023-01-08: qty 2

## 2023-01-08 MED ORDER — ACETAMINOPHEN 325 MG PO TABS: 650.0000 mg | ORAL_TABLET | Freq: Four times a day (QID) | ORAL | Status: DC | PRN

## 2023-01-08 MED ORDER — ACETAMINOPHEN 650 MG RE SUPP: 650.0000 mg | Freq: Four times a day (QID) | RECTAL | Status: DC | PRN

## 2023-01-08 NOTE — ED Notes (Signed)
First Nurse Note: Pt to ED via [facility] from [facility] for lower jaw pain. Pt states that it radiates down the front of her neck. Pt was 88% on room air pt does not wear oxygen at baseline.   BP: 186/45

## 2023-01-08 NOTE — Progress Notes (Signed)
Pt has very tiny veins and very deep - attempted x 2 Lt lower arm using ultrasound without success. Able to place # 18, 1.88" catheter Rt A/C - drew all labs & RN sent them down to the lab

## 2023-01-08 NOTE — ED Notes (Signed)
See triage note Patient presents from [facility]  States she developed some jaw pain  When questioned states pain is mainly at chin area   Did have some chest pain  PTA  denies any ches tpain at present  having nausea  Family at bedside

## 2023-01-08 NOTE — Assessment & Plan Note (Signed)
Headache Patient denies dental pain, TMJ pain CTA head and neck unrevealing Possibly related to NSTEMI Pain control

## 2023-01-08 NOTE — Sepsis Progress Note (Signed)
Following for sepsis monitoring ?

## 2023-01-08 NOTE — ED Provider Notes (Signed)
[facility] Provider Note    Event Date/Time   First MD Initiated Contact with Patient 01/08/23 1524     (approximate)   History   Chief Complaint Jaw Pain   HPI  Wendy Nelson is a 57 y.o. 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  Patient reports that she was on the phone with her mother this morning around 11 AM when she had sudden onset of diffuse headache with pain moving down towards her jaw.  She reports that she had some pain in her chest initially but this has since resolved, also had some difficulty breathing that has resolved as well.  She continues to have headache, light sensitivity, nausea, and jaw discomfort.  She denies any recent dental issues and has not noticed any swelling around her mouth.  She denies any new neurologic symptoms, has some movement in her arms at baseline but no movement in her legs.  She currently takes Eliquis.     Physical Exam   Triage Vital Signs: ED Triage Vitals [01/08/23 1335]  Enc Vitals Group     BP (!) 160/100     Pulse Rate 64     Resp 18     Temp 98 F (36.7 C)     Temp Source Oral     SpO2 93 %     Weight (!) 306 lb (138.8 kg)     Height 5\' 5"  (1.651 m)     Head Circumference      [facility]      Pain Score 7     Pain Loc      Pain Edu?      Excl. in GC?     Most recent vital signs: Vitals:   01/08/23 1947 01/08/23 2000  BP:  102/80  Pulse: 81 81  Resp:  20  Temp:    SpO2: 100% 100%    Constitutional: Alert and oriented. Eyes: Conjunctivae are normal. Head: Atraumatic. Nose: No congestion/rhinnorhea. Mouth/Throat: Mucous membranes are moist.  Neck: Supple with no meningismus. Cardiovascular: Tachycardic, regular rhythm. Grossly normal heart sounds.  2+ radial pulses bilaterally. Respiratory: Normal respiratory effort.  No  retractions. Lungs CTAB. Gastrointestinal: Soft and nontender. No distention. Musculoskeletal: No lower extremity tenderness nor edema.  Neurologic:  Normal speech and language.  Quadriplegic.    ED Results / Procedures / Treatments   Labs (all labs ordered are listed, but only abnormal results are displayed) Labs Reviewed  BASIC METABOLIC PANEL - Abnormal; Notable for the following components:      Result Value   Glucose, Bld 120 (*)    Calcium 8.8 (*)    All other components within normal limits  CBC - Abnormal; Notable for the following components:   WBC 11.3 (*)    RBC 5.18 (*)    RDW 15.9 (*)    All other components within normal limits  TROPONIN I (HIGH SENSITIVITY) - Abnormal; Notable for the following components:   Troponin I (High Sensitivity) 104 (*)    All other components within normal limits  TROPONIN I (HIGH SENSITIVITY) - Abnormal; Notable for the following components:   Troponin I (High Sensitivity) 118 (*)    All other components within normal limits  URINE CULTURE  URINALYSIS, ROUTINE W REFLEX MICROSCOPIC  HEPARIN LEVEL (UNFRACTIONATED)  PROTIME-INR  APTT     EKG  ED  ECG REPORT I, Chesley Noon, the attending physician, personally viewed and interpreted this ECG.   Date: 01/08/2023  EKG Time: 16:24  Rate: 112  Rhythm: sinus tachycardia  Axis: Normal  Intervals:none  ST&T Change: Diffuse ST abnormality  ED ECG REPORT I, Chesley Noon, the attending physician, personally viewed and interpreted this ECG.   Date: 01/08/2023  EKG Time: 20:17  Rate: 83  Rhythm: normal sinus rhythm  Axis: Normal  Intervals: Prolonged QT  ST&T Change: Mild anterior ST depression, ST abnormalities improved from previous   RADIOLOGY Chest x-ray reviewed and interpreted by me with no infiltrate, edema, or effusion.  PROCEDURES:  Critical Care performed: Yes, see critical care procedure note(s)  Procedures   MEDICATIONS ORDERED IN ED: Medications   cefTRIAXone (ROCEPHIN) 1 g in sodium chloride 0.9 % 100 mL IVPB (1 g Intravenous New Bag/Given 01/08/23 2058)  prochlorperazine (COMPAZINE) injection 10 mg (10 mg Intravenous Given 01/08/23 1822)  diphenhydrAMINE (BENADRYL) injection 25 mg (25 mg Intravenous Given 01/08/23 1821)  sodium chloride 0.9 % bolus 1,000 mL (0 mLs Intravenous Stopped 01/08/23 1944)  iohexol (OMNIPAQUE) 350 MG/ML injection 125 mL (125 mLs Intravenous Contrast Given 01/08/23 1912)  aspirin chewable tablet 324 mg (324 mg Oral Given 01/08/23 2057)     IMPRESSION / MDM / ASSESSMENT AND PLAN / ED COURSE  I reviewed the triage vital signs and the nursing notes.                              57 y.o. female with past medical history of hypertension, hyperlipidemia, and quadriplegia following spinal cord infarction at C6/C7 who presents to the ED complaining of sudden onset headache, jaw pain, and chest pain with nausea and photophobia.  Patient's presentation is most consistent with acute presentation with potential threat to life or bodily function.  Differential diagnosis includes, but is not limited to, SAH, migraine headache, meningitis, tension headache, carotid dissection, vertebral dissection, ACS, PE, pneumonia, pneumothorax.  Patient chronically ill-appearing but in no acute distress, vital signs remarkable for tachycardia and hypertension.  On my assessment, patient primarily complains of headache, jaw pain, and photophobia, states that chest pain has resolved.  EKG shows diffuse ST abnormality and troponin pending at this time.  We will check CT head for evidence of SAH given acute onset, imaging to be performed within 6 hours of onset of symptoms, should have adequate sensitivity for [facility].  Chest x-ray is unremarkable.  We will treat symptomatically with IV Compazine and Benadryl.  CT head negative for acute process, CTA head and neck performed and also unremarkable.  Troponin noted to be elevated with no prior history of  elevated troponin, patient complains of jaw pain but no ongoing chest pain.  Repeat EKG shows improvement in prior ST changes, patient given loading dose of aspirin.  CTA of chest/abdomen/pelvis negative for dissection, does show findings concerning for acute on chronic cystitis with right ureteral obstruction.  Patient's chronic Foley catheter was replaced and we will send culture off of the sample, treat with IV Rocephin.  Case discussed with hospitalist for admission, repeat troponin slightly uptrending and we will start on heparin.  Patient would benefit from urology evaluation during hospital admission given ureteral obstruction, but no emergent intervention needed at this time.      FINAL CLINICAL IMPRESSION(S) / ED DIAGNOSES   Final diagnoses:  NSTEMI (non-ST elevated myocardial infarction) (HCC)  Chronic cystitis  Acute nonintractable headache, unspecified headache  type     Rx / DC Orders   ED Discharge Orders     None        Note:  This document was prepared using Dragon voice recognition software and may include unintentional dictation errors.   Chesley Noon, MD 01/08/23 2117

## 2023-01-08 NOTE — ED Triage Notes (Signed)
Patient to ED via [facility] from [facility] for pain under jaw. Started PTA but states she also had CP PTA but none at this time.

## 2023-01-08 NOTE — ED Notes (Signed)
IV team in with pt  D/T diff stick   IV meds given   and fluids started

## 2023-01-08 NOTE — Assessment & Plan Note (Addendum)
CAD Patient presents with intermittent chest pain, jaw pain and headache.  Troponin 104-114 and EKG with nonspecific ST-T wave changes Possible demand ischemia if ruled in for UTI Continue heparin infusion Continue aspirin, atorvastatin  Nitroglycerin sublingual as needed chest pain with morphine for breakthrough Cardiology consulted

## 2023-01-08 NOTE — H&P (Signed)
History and Physical    Patient: Wendy Nelson GNF:[id number] DOB: 1966/05/22 DOA: 01/08/2023 DOS: the patient was seen and examined on 01/08/2023 PCP: [facility]  Patient coming from: SNF  Chief Complaint:  Chief Complaint  Patient presents with   Jaw Pain    HPI: Wendy Nelson is a 57 y.o. 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  She initially had chest pain but this resolved by admission.  She has some associated shortness of breath.  Denies nausea vomiting and diaphoresis. ED course and data review: BP 160/100 with otherwise normal vitals.Notable labs include troponin 104> 118.  WBC 11,000.  Urinalysis pending EKG, personally viewed and interpreted shows NSR at 89 with nonspecific ST-T wave changes. Patient had extensive imaging due to concern for dissection including CT angio chest abdomen and pelvis as well as CT angio head and neck with and without that showed no significant abnormalities related to dissection though did show right-sided obstructive uropathy, likely acute on chronic cystitis among other related findings. Chest x-ray showed cardiomegaly, otherwise nonacute. Patient treated with chewable aspirin and started on heparin for possible NSTEMI She was also started on Rocephin for possible UTI Hospitalist consulted for admission.   Review of Systems: As mentioned in the history of present illness. All other systems reviewed and are negative.  Past Medical History:  Diagnosis Date   Quadriplegia [facility])    Stroke (HCC) 09/2020   Past Surgical History:  Procedure Laterality Date   CERVICAL SPINE SURGERY     JOINT REPLACEMENT Right    Knee   Social History:  reports that she has  never smoked. She has never used smokeless tobacco. No history on file for alcohol use and drug use.  Allergies  Allergen Reactions   Melatonin    Misc. Sulfonamide Containing Compounds    Neomycin Other (See Comments)    Positive patch test   Sulfa Antibiotics Itching    Family History  Problem Relation Age of Onset   Diabetes Mellitus II Mother    Hypertension Mother    Diabetes Mellitus II Brother     Prior to Admission medications   Medication Sig Start Date End Date Taking? Authorizing Provider  Acetaminophen 500 MG capsule Take 1,000 mg by mouth every 6 (six) hours as needed. 10/12/20   [provider]  Artificial Saliva (ORAL RELIEF FOR DRY MOUTH) LOZG by Transmucosal route.    [provider]  aspirin 81 MG chewable tablet Chew 81 mg by mouth daily.    [provider]  atorvastatin (LIPITOR) 10 MG tablet Take 10 mg by mouth daily.    [provider]  benzonatate (TESSALON) 100 MG capsule Take 100 mg by mouth daily as needed for cough.    [provider]  bisacodyl (DULCOLAX) 10 MG suppository Place 1 suppository (10 mg total) rectally daily as needed for moderate constipation. 01/02/21   Joseph Art, DO  CRANBERRY PO Take by mouth daily.    [provider]  D-Mannose 500 MG CAPS Take by mouth daily. AZO    [provider]  dextromethorphan-guaiFENesin (MUCINEX DM) 30-600 MG 12hr tablet Take 1 tablet by mouth every 12 (twelve) hours.    [provider]  DULoxetine (CYMBALTA) 30 MG capsule Take 30  mg by mouth daily. 10/15/21   [provider]  DULoxetine (CYMBALTA) 60 MG capsule Take 60 mg by mouth daily. 09/11/21   [provider]  ELIQUIS 5 MG TABS tablet Take 5 mg by mouth every other day. 09/22/21   [provider]  Emollient [facility]) OINT Apply 1 application. topically daily. To feet.    [provider]  Ergocalciferol 50 MCG (2000 UT) TABS Take 2 tablets by mouth  daily.    [provider]  fluticasone (FLONASE) 50 MCG/ACT nasal spray Place 1 spray into both nostrils daily.    [provider]  ipratropium-albuterol (DUONEB) 0.5-2.5 (3) MG/3ML SOLN Take 3 mLs by nebulization every 4 (four) hours as needed.    [provider]  lactulose (CHRONULAC) 10 GM/15ML solution Take 30 mLs (20 g total) by mouth daily as needed for mild constipation. 04/04/22   Irean Hong, MD  lidocaine (LMX) 4 % cream Apply 1 Application topically daily.    [provider]  Lidocaine HCl 4 % SOLN Apply 4 mLs topically daily.    [provider]  LINZESS 290 MCG CAPS capsule Take 290 mcg by mouth daily before breakfast. 06/05/22   [provider]  loratadine (CLARITIN) 10 MG tablet Take 10 mg by mouth daily.    [provider]  meclizine (ANTIVERT) 25 MG tablet Take 25 mg by mouth daily as needed. 05/21/22   [provider]  methocarbamol (ROBAXIN) 500 MG tablet Take 1 tablet (500 mg total) by mouth every 6 (six) hours as needed for muscle spasms. 01/02/21   Joseph Art, DO  Multiple Vitamin (MULTIVITAMIN) tablet Take 1 tablet by mouth daily.    [provider]  naloxone [facility]) nasal spray 4 mg/0.1 mL Place 1 spray into the nose once.    [provider]  nystatin (MYCOSTATIN/NYSTOP) powder Apply topically 2 (two) times daily. 01/02/21   Joseph Art, DO  omeprazole (PRILOSEC) 40 MG capsule Take 40 mg by mouth daily. 12/03/22   [provider]  ondansetron (ZOFRAN) 4 MG tablet Take 4 mg by mouth every 6 (six) hours as needed for nausea.    [provider]  ondansetron (ZOFRAN-ODT) 4 MG disintegrating tablet Take 1 tablet (4 mg total) by mouth every 8 (eight) hours as needed for nausea or vomiting. 04/04/22   Irean Hong, MD  oxyCODONE (OXY IR/ROXICODONE) 5 MG immediate release tablet Take 5 mg by mouth every 4 (four) hours as needed for moderate pain.    [provider]   Polyethylene Glycol 400 (VISINE DRY EYE RELIEF) 1 % SOLN Apply 2 drops to eye 2 (two) times daily.    [provider]  polyethylene glycol powder (GLYCOLAX/MIRALAX) 17 GM/SCOOP powder Take 1 Container by mouth daily.    [provider]  pregabalin (LYRICA) 150 MG capsule Take 150 mg by mouth 2 (two) times daily. 09/25/21   [provider]  senna-docusate (SENOKOT-S) 8.6-50 MG tablet Take 1 tablet by mouth 2 (two) times daily.    [provider]  traZODone (DESYREL) 50 MG tablet Take 50 mg by mouth at bedtime. 09/11/21   [provider]  hydrochlorothiazide (HYDRODIURIL) 25 MG tablet Take 1 tablet by mouth daily. Patient not taking: Reported on 12/23/2020 12/14/16 12/24/20  [provider]    Physical Exam: Vitals:   01/08/23 1827 01/08/23 1946 01/08/23 1947 01/08/23 2000  BP: (!) 150/90 (!) 82/68  102/80  Pulse: 68  81 81  Resp: 18   20  Temp: 98 F (36.7 C)     TempSrc: Oral     SpO2: 97%  100% 100%  Weight:      Height:       Physical Exam Vitals and nursing note reviewed.  Constitutional:      General: She is not in acute distress. HENT:     Head: Normocephalic and atraumatic.  Cardiovascular:     Rate and Rhythm: Normal rate and regular rhythm.     Heart sounds: Normal heart sounds.  Pulmonary:     Effort: Pulmonary effort is normal.     Breath sounds: Normal breath sounds.  Abdominal:     Palpations: Abdomen is soft.     Tenderness: There is no abdominal tenderness.  Neurological:     Mental Status: Mental status is at baseline.     Labs on Admission: I have personally reviewed following labs and imaging studies  CBC: Recent Labs  Lab 01/08/23 1809  WBC 11.3*  HGB 14.4  HCT 45.2  MCV 87.3  PLT 233   Basic Metabolic Panel: Recent Labs  Lab 01/08/23 1809  NA 138  K 3.8  CL 101  CO2 27  GLUCOSE 120*  BUN 14  CREATININE 0.95  CALCIUM 8.8*   GFR: Estimated Creatinine Clearance: 94.7 mL/min (by C-G  formula based on SCr of 0.95 mg/dL). Liver Function Tests: No results for input(s): "AST", "ALT", "ALKPHOS", "BILITOT", "PROT", "ALBUMIN" in the last 168 hours. No results for input(s): "LIPASE", "AMYLASE" in the last 168 hours. No results for input(s): "AMMONIA" in the last 168 hours. Coagulation Profile: No results for input(s): "INR", "PROTIME" in the last 168 hours. Cardiac Enzymes: No results for input(s): "CKTOTAL", "CKMB", "CKMBINDEX", "TROPONINI" in the last 168 hours. BNP (last 3 results) No results for input(s): "PROBNP" in the last 8760 hours. HbA1C: No results for input(s): "HGBA1C" in the last 72 hours. CBG: No results for input(s): "GLUCAP" in the last 168 hours. Lipid Profile: No results for input(s): "CHOL", "HDL", "LDLCALC", "TRIG", "CHOLHDL", "LDLDIRECT" in the last 72 hours. Thyroid Function Tests: No results for input(s): "TSH", "T4TOTAL", "FREET4", "T3FREE", "THYROIDAB" in the last 72 hours. Anemia Panel: No results for input(s): "VITAMINB12", "FOLATE", "FERRITIN", "TIBC", "IRON", "RETICCTPCT" in the last 72 hours. Urine analysis:    Component Value Date/Time   COLORURINE YELLOW (A) 09/12/2022 0034   APPEARANCEUR Cloudy (A) 12/19/2022 1304   LABSPEC 1.010 09/12/2022 0034   PHURINE 6.0 09/12/2022 0034   GLUCOSEU Negative 12/19/2022 1304   HGBUR MODERATE (A) 09/12/2022 0034   BILIRUBINUR Negative 12/19/2022 1304   KETONESUR NEGATIVE 09/12/2022 0034   PROTEINUR 3+ (A) 12/19/2022 1304   PROTEINUR >=300 (A) 09/12/2022 0034   NITRITE Positive (A) 12/19/2022 1304   NITRITE NEGATIVE 09/12/2022 0034   LEUKOCYTESUR 3+ (A) 12/19/2022 1304   LEUKOCYTESUR LARGE (A) 09/12/2022 0034    Radiological Exams on Admission: CT ANGIO HEAD NECK W WO CM  Result Date: 01/08/2023 CLINICAL DATA:  Carotid artery dissection suspected.  Jaw pain. EXAM: CT ANGIOGRAPHY HEAD AND NECK WITH AND WITHOUT CONTRAST TECHNIQUE: Multidetector CT imaging of the head and neck was performed using  the standard protocol during bolus administration of intravenous contrast. Multiplanar CT image reconstructions and MIPs were obtained to evaluate the vascular anatomy. Carotid stenosis measurements (when applicable) are obtained utilizing NASCET criteria, using the distal internal carotid diameter as the denominator. RADIATION DOSE REDUCTION: This exam was performed according to the departmental dose-optimization program which includes automated  exposure control, adjustment of the mA and/or kV according to patient size and/or use of iterative reconstruction technique. CONTRAST:  OMNIPAQUE IOHEXOL 350 MG/ML SOLN COMPARISON:  None Available. FINDINGS: CTA NECK FINDINGS Aortic arch: Standard 3 vessel aortic arch with widely patent arch vessel origins. Right carotid system: Patent without evidence of stenosis or dissection. Retropharyngeal course of the distal common and proximal internal carotid arteries. Left carotid system: Patent without evidence of stenosis or dissection. Retropharyngeal course of the distal common carotid artery. Vertebral arteries: Streak artifact from cervical spine instrumentation obscures portions of both V2 segments. Both vertebral arteries are patent elsewhere without evidence of a significant stenosis or dissection. Skeleton: Solid C4-C6 ACDF and C5-C7 posterior fusion. Other neck: No evidence of cervical lymphadenopathy or mass. Upper chest: No mass or consolidation in the included lung apices. Review of the MIP images confirms the above findings CTA HEAD FINDINGS Anterior circulation: The internal carotid arteries are widely patent from skull base to carotid termini. ACAs and MCAs are patent without evidence of a proximal branch occlusion or significant proximal stenosis. Posterior circulation: Intracranial vertebral arteries are widely patent to the basilar. Patent bilateral PICA, left AICA, and bilateral SCA origins are visualized. The basilar artery is widely patent. There are  patent posterior communicating arteries bilaterally. Both PCAs are patent without evidence of a significant proximal stenosis. No aneurysm is identified. Venous sinuses: Poorly evaluated due to arterial contrast timing. Anatomic variants: None. Review of the MIP images confirms the above findings IMPRESSION: No large vessel occlusion, significant stenosis, or dissection in the head or neck within limitations of streak artifact through the V2 vertebral arteries. Electronically Signed   By: Sebastian Ache M.D.   On: 01/08/2023 19:45   CT Angio Chest/Abd/Pel for Dissection W and/or W/WO  Result Date: 01/08/2023 CLINICAL DATA:  Jaw pain, headache, chest pain EXAM: CT ANGIOGRAPHY CHEST, ABDOMEN AND PELVIS TECHNIQUE: Non-contrast CT of the chest was initially obtained. Multidetector CT imaging through the chest, abdomen and pelvis was performed using the standard protocol during bolus administration of intravenous contrast. Multiplanar reconstructed images and MIPs were obtained and reviewed to evaluate the vascular anatomy. RADIATION DOSE REDUCTION: This exam was performed according to the departmental dose-optimization program which includes automated exposure control, adjustment of the mA and/or kV according to patient size and/or use of iterative reconstruction technique. CONTRAST:  OMNIPAQUE IOHEXOL 350 MG/ML SOLN COMPARISON:  01/08/2023, 04/04/2022, 04/30/2021 FINDINGS: CTA CHEST FINDINGS Cardiovascular: No evidence of thoracic aortic aneurysm or dissection. Mild cardiomegaly unchanged, with no pericardial effusion. There is technically adequate opacification of the pulmonary vasculature. No filling defects or pulmonary emboli. Mediastinum/Nodes: Stable appearance of the thyroid, trachea, and esophagus. No pathologic mediastinal or hilar adenopathy. Lungs/Pleura: No acute airspace disease, effusion, or pneumothorax. Dependent hypoventilatory changes. Central airways are patent. Musculoskeletal: No acute or  destructive bony abnormalities. Stable postsurgical changes at the cervicothoracic junction. Reconstructed images demonstrate no additional findings. Review of the MIP images confirms the above findings. CTA ABDOMEN AND PELVIS FINDINGS VASCULAR Aorta: Normal caliber aorta without aneurysm, dissection, vasculitis or significant stenosis. Mild atherosclerosis. Celiac: Patent without evidence of aneurysm, dissection, vasculitis or significant stenosis. SMA: Patent without evidence of aneurysm, dissection, vasculitis or significant stenosis. Renals: Both renal arteries are patent without evidence of aneurysm, dissection, vasculitis, fibromuscular dysplasia or significant stenosis. IMA: Patent without evidence of aneurysm, dissection, vasculitis or significant stenosis. Inflow: Patent without evidence of aneurysm, dissection, vasculitis or significant stenosis. Veins: No obvious venous abnormality within the limitations of this  arterial phase study. Review of the MIP images confirms the above findings. NON-VASCULAR Hepatobiliary: No focal liver abnormality is seen. No gallstones, gallbladder wall thickening, or biliary dilatation. Pancreas: Unremarkable. No pancreatic ductal dilatation or surrounding inflammatory changes. Spleen: Normal in size without focal abnormality. Adrenals/Urinary Tract: Right greater than left renal cortical thinning, with areas of right renal cortical scarring. There is mild to moderate right-sided hydronephrosis and hydroureter, likely due to irregular bladder wall thickening extending to the right UVJ. No left-sided obstruction. The adrenals are unremarkable. There is an indwelling catheter decompressing the bladder. Progressive irregular bladder wall thickening is identified anteriorly, consistent with chronic cystitis given chronic indwelling Foley catheter. Since the prior MRI, wall thickening is slightly more pronounced and there is mild perivesicular fat stranding. This could reflect  superimposed acute cystitis. The wall thickening extends to the level of the bilateral UVJs, with evidence of right-sided obstructive uropathy as described above. Stomach/Bowel: No bowel obstruction or ileus. Normal appendix right lower quadrant. No bowel wall thickening or inflammatory change. Lymphatic: No pathologic adenopathy. Reproductive: Uterus and bilateral adnexa are unremarkable. Other: No free fluid or free intraperitoneal gas. No abdominal wall hernia. Musculoskeletal: No acute or destructive bony abnormalities. Reconstructed images demonstrate no additional findings. Review of the MIP images confirms the above findings. IMPRESSION: Vascular: 1. No evidence of thoracoabdominal aortic aneurysm or dissection. 2. No evidence of pulmonary embolus. 3.  Aortic Atherosclerosis (ICD10-I70.0). Nonvascular: 1. Likely acute on chronic cystitis, with irregular bladder wall thickening involving the ventral aspect of the bladder and extending posteriorly to the level of the ureterovesicular junctions. 2. Right-sided obstructive uropathy, with progressive right renal cortical scarring. This is likely due to obstruction at the level of the right UVJ due to chronic cystitis. Urology consultation recommended. 3. No acute intrathoracic process. Electronically Signed   By: Sharlet Salina M.D.   On: 01/08/2023 19:40   CT Head Wo Contrast  Result Date: 01/08/2023 CLINICAL DATA:  Sudden onset severe headache, jaw pain EXAM: CT HEAD WITHOUT CONTRAST TECHNIQUE: Contiguous axial images were obtained from the base of the skull through the vertex without intravenous contrast. RADIATION DOSE REDUCTION: This exam was performed according to the departmental dose-optimization program which includes automated exposure control, adjustment of the mA and/or kV according to patient size and/or use of iterative reconstruction technique. COMPARISON:  04/12/2021 FINDINGS: Brain: No acute infarct or hemorrhage. Lateral ventricles and  midline structures are unremarkable. No acute extra-axial fluid collections. No mass effect. Vascular: No hyperdense vessel or unexpected calcification. Skull: Normal. Negative for fracture or focal lesion. Sinuses/Orbits: No acute finding. Other: None. IMPRESSION: 1. Stable head CT, no acute intracranial process. Electronically Signed   By: Sharlet Salina M.D.   On: 01/08/2023 16:59   DG Chest 2 View  Result Date: 01/08/2023 CLINICAL DATA:  Chest pain EXAM: CHEST - 2 VIEW COMPARISON:  Previous studies including the examination of 09/12/2022 FINDINGS: Transverse diameter of heart is increased. There are no signs of pulmonary edema or focal pulmonary consolidation. There is linear density in the lateral aspect of right lower lung field, pleural thickening with no significant change. There is no pleural effusion or pneumothorax. Surgical fusion is seen in lower cervical spine. IMPRESSION: Cardiomegaly. There are no signs of pulmonary edema or focal pulmonary consolidation. Electronically Signed   By: Ernie Avena M.D.   On: 01/08/2023 14:27     Data Reviewed: Relevant notes from primary care and specialist visits, past discharge summaries as available in EHR, including Care Everywhere. Prior  diagnostic testing as pertinent to current admission diagnoses Updated medications and problem lists for reconciliation ED course, including vitals, labs, imaging, treatment and response to treatment Triage notes, nursing and pharmacy notes and ED provider's notes Notable results as noted in HPI   Assessment and Plan: * NSTEMI (non-ST elevated myocardial infarction) [facility]) CAD Patient presents with intermittent chest pain, jaw pain and headache.  Troponin 104-114 and EKG with nonspecific ST-T wave changes Possible demand ischemia if ruled in for UTI Continue heparin infusion Continue aspirin, atorvastatin  Nitroglycerin sublingual as needed chest pain with morphine for breakthrough Cardiology  consulted  Complicated UTI (urinary tract infection) SIRS, questionable sepsis Following admission patient became hypotensive with systolic drifting down to the 40J, eventually fluid responsive to 101/65.  Other vitals remain unchanged Sepsis workup was initiated.  Urinalysis from exchanged Foley showing large leukocytes esterase lactic acid was 1.3 and procalcitonin less than 0.1 Patient was hospitalized a year ago in April 2023 for UTI and sepsis presenting with hypotension Will treat empirically with ceftriaxone and sepsis fluids Follow urine culture   Jaw pain Headache Patient denies dental pain, TMJ pain CTA head and neck unrevealing Possibly related to NSTEMI Pain control    Complicated UTI (urinary tract infection) Neurogenic bladder with history of bilateral hydronephrosis  Received ceftriaxone in the ED Foley exchange in the ED and UA sent Follow cultures Continue ceftriaxone if UA positive and culture positive    Quadriplegia, 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  Decubitus precautions   Neurogenic bladder Secondary to spinal cord infarct.   Foley exchanged in the ED   Depression with anxiety Continue duloxetine and trazodone   HTN (hypertension) Continue antihypertensives   DVT prophylaxis: heparin infusion  Consults: cardiology  Advance Care Planning:   Code Status: Prior   Family Communication: none  Disposition Plan: Back to previous home environment  Severity of Illness: The appropriate patient status for this patient is INPATIENT. Inpatient status is judged to be reasonable and necessary in order to provide the required intensity of service to ensure the patient's safety. The patient's presenting symptoms, physical exam findings, and initial radiographic and laboratory data in the context of their chronic comorbidities is felt to place  them at high risk for further clinical deterioration. Furthermore, it is not anticipated that the patient will be medically stable for discharge from the hospital within 2 midnights of admission.   * I certify that at the point of admission it is my clinical judgment that the patient will require inpatient hospital care spanning beyond 2 midnights from the point of admission due to high intensity of service, high risk for further deterioration and high frequency of surveillance required.*  CRITICAL CARE Performed by: Andris Baumann   Total critical care time: 90 minutes  Critical care time was exclusive of separately billable procedures and treating other patients.  Critical care was necessary to treat or prevent imminent or life-threatening deterioration.  Critical care was time spent personally by me on the following activities: development of treatment plan with patient and/or surrogate as well as nursing, discussions with consultants, evaluation of patient's response to treatment, examination of patient, obtaining history from patient or surrogate, ordering and performing treatments and interventions, ordering and review of laboratory studies, ordering and review of radiographic studies, pulse oximetry and re-evaluation of patient's condition.    Author: Andris Baumann, MD 01/08/2023 9:24 PM  For on call review [email or web address].

## 2023-01-08 NOTE — Progress Notes (Signed)
ANTICOAGULATION CONSULT NOTE  Pharmacy Consult for heparin infusion Indication: chest pain/ACS  Allergies  Allergen Reactions   Melatonin    Misc. Sulfonamide Containing Compounds    Neomycin Other (See Comments)    Positive patch test   Sulfa Antibiotics Itching    Patient Measurements: Height: 5\' 5"  (165.1 cm) Weight: (!) 138.8 kg (306 lb) IBW/kg (Calculated) : 57 Heparin Dosing Weight: 91.5 kg  Vital Signs: Temp: 98 F (36.7 C) (06/25 1827) Temp Source: Oral (06/25 1827) BP: 102/80 (06/25 2000) Pulse Rate: 81 (06/25 2000)  Labs: Recent Labs    01/08/23 1809 01/08/23 1941  HGB 14.4  --   HCT 45.2  --   PLT 233  --   CREATININE 0.95  --   TROPONINIHS 104* 118*    Estimated Creatinine Clearance: 94.7 mL/min (by C-G formula based on SCr of 0.95 mg/dL).   Medical History: Past Medical History:  Diagnosis Date   Quadriplegia [facility])    Stroke (HCC) 09/2020    Assessment: 57 y.o. female w/ PMH of HTN, HLD, quadriplegia secondary to C6/7 spinal cord infarction after fusion who presents to the ED complaining of headache and jaw pain. She is on apixaban PTA w/ last dose 06/25 am at undetermined time  Goal of Therapy:  Heparin level 0.3-0.7 units/ml aPTT 66 - 102  seconds Monitor platelets by anticoagulation protocol: Yes   Plan:  ---Give 4000 units bolus x 1 ---Start heparin infusion at 1250 units/hr ---Check aPTT level in 6 hours (we will use aPTT levels to guide therapy given apixaban exposure) ---Check anti-Xa once daily to evaluate correlation with aPTT ---Continue to monitor H&H and platelets  Lowella Bandy 01/08/2023,8:29 PM

## 2023-01-09 DIAGNOSIS — I214 Non-ST elevation (NSTEMI) myocardial infarction: Secondary | ICD-10-CM | POA: Diagnosis not present

## 2023-01-09 LAB — PROCALCITONIN: Procalcitonin: <0.1 ng/mL

## 2023-01-09 LAB — BASIC METABOLIC PANEL
Anion gap: 8 (ref 5–15)
BUN: 12 mg/dL (ref 6–20)
CO2: 26 mmol/L (ref 22–32)
Calcium: 8.1 mg/dL — ABNORMAL LOW (ref 8.9–10.3)
Chloride: 109 mmol/L (ref 98–111)
Creatinine, Ser: 0.82 mg/dL (ref 0.44–1.00)
GFR, Estimated: >60 mL/min (ref >60)
Glucose, Bld: 121 mg/dL — ABNORMAL HIGH (ref 70–99)
Potassium: 3.5 mmol/L (ref 3.5–5.1)
Sodium: 143 mmol/L (ref 135–145)

## 2023-01-09 LAB — CBC
HCT: 35.5 % — ABNORMAL LOW (ref 36.0–46.0)
Hemoglobin: 11.2 g/dL — ABNORMAL LOW (ref 12.0–15.0)
MCH: 27.5 pg (ref 26.0–34.0)
MCHC: 31.5 g/dL (ref 30.0–36.0)
MCV: 87 fL (ref 80.0–100.0)
Platelets: 249 10*3/uL (ref 150–400)
RBC: 4.08 MIL/uL (ref 3.87–5.11)
RDW: 15.6 % — ABNORMAL HIGH (ref 11.5–15.5)
WBC: 8.6 10*3/uL (ref 4.0–10.5)
nRBC: 0 % (ref 0.0–0.2)

## 2023-01-09 LAB — APTT
aPTT: >200 seconds (ref 24–36)
aPTT: 162 seconds (ref 24–36)

## 2023-01-09 LAB — HIV ANTIBODY (ROUTINE TESTING W REFLEX): HIV Screen 4th Generation wRfx: NONREACTIVE

## 2023-01-09 LAB — LIPID PANEL
Cholesterol: 120 mg/dL (ref 0–200)
HDL: 33 mg/dL — ABNORMAL LOW (ref >40)
LDL Cholesterol: 68 mg/dL (ref 0–99)
Total CHOL/HDL Ratio: 3.6 RATIO
Triglycerides: 96 mg/dL (ref <150)
VLDL: 19 mg/dL (ref 0–40)

## 2023-01-09 LAB — TROPONIN I (HIGH SENSITIVITY): Troponin I (High Sensitivity): 30 ng/L — ABNORMAL HIGH (ref <18)

## 2023-01-09 LAB — HEPARIN LEVEL (UNFRACTIONATED)
Heparin Unfractionated: >1.1 IU/mL — ABNORMAL HIGH (ref 0.30–0.70)
Heparin Unfractionated: >1.1 IU/mL — ABNORMAL HIGH (ref 0.30–0.70)

## 2023-01-09 LAB — PROTIME-INR
INR: 1.3 — ABNORMAL HIGH (ref 0.8–1.2)
Prothrombin Time: 16.8 seconds — ABNORMAL HIGH (ref 11.4–15.2)

## 2023-01-09 MED ORDER — OXYCODONE HCL 5 MG PO TABS
5.0000 mg | ORAL_TABLET | ORAL | Status: DC | PRN
  Administered 2023-01-09 – 2023-01-11 (×9): 5 mg via ORAL
  Filled 2023-01-09 (×9): qty 1

## 2023-01-09 MED ORDER — BISACODYL 10 MG RE SUPP: 10.0000 mg | Freq: Every day | RECTAL | Status: DC | PRN

## 2023-01-09 MED ORDER — HEPARIN (PORCINE) 25000 UT/250ML-% IV SOLN
1050.0000 [IU]/h | INTRAVENOUS | Status: DC
  Administered 2023-01-09: 1050 [IU]/h via INTRAVENOUS
  Filled 2023-01-09: qty 250

## 2023-01-09 MED ORDER — CHLORHEXIDINE GLUCONATE CLOTH 2 % EX PADS
6.0000 | MEDICATED_PAD | Freq: Every day | CUTANEOUS | Status: DC
  Administered 2023-01-09 – 2023-01-12 (×4): 6 via TOPICAL

## 2023-01-09 MED ORDER — FLUTICASONE PROPIONATE 50 MCG/ACT NA SUSP: 1.0000 | Freq: Every day | NASAL | Status: DC | PRN

## 2023-01-09 MED ORDER — DM-GUAIFENESIN ER 30-600 MG PO TB12
1.0000 | ORAL_TABLET | Freq: Two times a day (BID) | ORAL | Status: DC
  Administered 2023-01-09 – 2023-01-11 (×5): 1 via ORAL
  Filled 2023-01-09 (×7): qty 1

## 2023-01-09 MED ORDER — PANTOPRAZOLE SODIUM 40 MG PO TBEC
40.0000 mg | DELAYED_RELEASE_TABLET | Freq: Every day | ORAL | Status: DC
  Administered 2023-01-09 – 2023-01-12 (×4): 40 mg via ORAL
  Filled 2023-01-09 (×4): qty 1

## 2023-01-09 MED ORDER — MECLIZINE HCL 25 MG PO TABS: 25.0000 mg | ORAL_TABLET | Freq: Every day | ORAL | Status: DC | PRN

## 2023-01-09 MED ORDER — POLYVINYL ALCOHOL 1.4 % OP SOLN
2.0000 [drp] | Freq: Two times a day (BID) | OPHTHALMIC | Status: DC
  Administered 2023-01-09 – 2023-01-12 (×7): 2 [drp] via OPHTHALMIC
  Filled 2023-01-09 (×2): qty 15

## 2023-01-09 MED ORDER — PREGABALIN 75 MG PO CAPS
150.0000 mg | ORAL_CAPSULE | Freq: Two times a day (BID) | ORAL | Status: DC
  Administered 2023-01-09 – 2023-01-12 (×7): 150 mg via ORAL
  Filled 2023-01-09 (×3): qty 2
  Filled 2023-01-09: qty 3
  Filled 2023-01-09 (×3): qty 2

## 2023-01-09 MED ORDER — PREDNISOLONE ACETATE 1 % OP SUSP
1.0000 [drp] | Freq: Every day | OPHTHALMIC | Status: DC
  Administered 2023-01-10 – 2023-01-12 (×2): 1 [drp] via OPHTHALMIC
  Filled 2023-01-09: qty 5
  Filled 2023-01-09: qty 1

## 2023-01-09 MED ORDER — SENNOSIDES-DOCUSATE SODIUM 8.6-50 MG PO TABS
1.0000 | ORAL_TABLET | Freq: Two times a day (BID) | ORAL | Status: DC
  Administered 2023-01-09 – 2023-01-11 (×6): 1 via ORAL
  Filled 2023-01-09 (×6): qty 1

## 2023-01-09 MED ORDER — POLYETHYLENE GLYCOL 3350 17 G PO PACK
17.0000 g | PACK | Freq: Every day | ORAL | Status: DC
  Administered 2023-01-09 – 2023-01-11 (×3): 17 g via ORAL
  Filled 2023-01-09 (×3): qty 1

## 2023-01-09 MED ORDER — BENZONATATE 100 MG PO CAPS: 100.0000 mg | ORAL_CAPSULE | Freq: Every day | ORAL | Status: DC | PRN

## 2023-01-09 MED ORDER — LACTATED RINGERS IV SOLN: INTRAVENOUS | Status: DC

## 2023-01-09 MED ORDER — LINACLOTIDE 145 MCG PO CAPS
290.0000 ug | ORAL_CAPSULE | Freq: Every day | ORAL | Status: DC
  Administered 2023-01-09 – 2023-01-12 (×4): 290 ug via ORAL
  Filled 2023-01-09 (×3): qty 2
  Filled 2023-01-09: qty 1
  Filled 2023-01-09: qty 2

## 2023-01-09 MED ORDER — ENOXAPARIN SODIUM 80 MG/0.8ML IJ SOSY
0.5000 mg/kg | PREFILLED_SYRINGE | INTRAMUSCULAR | Status: DC
  Administered 2023-01-09 – 2023-01-11 (×3): 70 mg via SUBCUTANEOUS
  Filled 2023-01-09 (×3): qty 0.8

## 2023-01-09 NOTE — Progress Notes (Signed)
PROGRESS NOTE    Wendy Nelson  MWU:[id number] DOB: [birth year] DOA: 01/08/2023 PCP: [facility]    Brief Narrative:   57 y.o. 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  She initially had chest pain but this resolved by admission.  She has some associated shortness of breath.  Denies nausea vomiting and diaphoresis. ED course and data review: BP 160/100 with otherwise normal vitals.Notable labs include troponin 104> 118.  WBC 11,000.      Assessment & Plan:   Principal Problem:   NSTEMI (non-ST elevated myocardial infarction) 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  Troponin 104-114 and EKG with nonspecific ST-T wave changes.  These nonspecific changes have been present in EKG since 2018.  Suspect supply/demand ischemia in the setting of complicated urinary tract infection Plan: DC IV heparin Continue aspirin and statin Sublingual nitro as needed chest pain 2D echocardiogram No further invasive cardiac diagnostics   Complicated UTI (urinary tract infection) SIRS, questionable sepsis Following admission patient became hypotensive with systolic drifting down to the 72Z, eventually fluid responsive to 101/65.  Other vitals remain unchanged Sepsis workup was  initiated.  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  Decubitus precautions   Neurogenic bladder Secondary to spinal cord infarct.   Foley exchanged in the ED   Depression with anxiety Continue duloxetine and trazodone   HTN (hypertension) Continue antihypertensives   DVT prophylaxis: SQ Lovenox Code Status: Full Family Communication: None Disposition Plan: Status is: Inpatient Remains inpatient appropriate because: Complicated UTI   Level of care: Progressive  Consultants:  Cardiology  Procedures:  None  Antimicrobials: Ceftriaxone   Subjective: Seen and examined.  Complains of headache and photophobia.  Objective: Vitals:   01/09/23 1215 01/09/23 1245 01/09/23 1315 01/09/23 1339  BP:    (!) 100/52  Pulse: 63 73 72 75  Resp: 17 18 19 14   Temp:      TempSrc:      SpO2: 97% 99% 99% 99%  Weight:      Height:        Intake/Output Summary (Last 24 hours) at 01/09/2023 1520 Last data filed at 01/09/2023 1017 Gross per 24 hour  Intake 2100 ml  Output 525 ml  Net 1575 ml   Filed Weights   01/08/23 1335  Weight: (!) 138.8 kg    Examination:  General exam: NAD.  Appears chronically ill Respiratory system: Diminished lung sounds.  Poor respiratory effort.  Normal work of breathing.  Room air Cardiovascular system: S1-S2, RRR, no murmurs, no pedal edema Gastrointestinal system: Soft, NT/ND,  normal bowel sounds Central nervous system: Alert and oriented. No focal neurological deficits. Extremities: 0X5 power bilaterally lower extremities Skin: No rashes, lesions or ulcers Psychiatry: Judgement and insight appear normal. Mood & affect flattened.     Data Reviewed: I have personally reviewed following labs and imaging studies  CBC: Recent Labs  Lab 01/08/23 1809 01/09/23 0853  WBC 11.3* 8.6  HGB 14.4 11.2*  HCT 45.2 35.5*  MCV 87.3 87.0  PLT 233 249   Basic Metabolic Panel: Recent Labs  Lab 01/08/23 1809 01/09/23 0853  NA 138 143  K 3.8 3.5  CL 101 109  CO2 27 26  GLUCOSE 120* 121*  BUN 14 12  CREATININE 0.95 0.82  CALCIUM 8.8* 8.1*   GFR: Estimated Creatinine Clearance: 109.8 mL/min (by C-G formula based on SCr of 0.82 mg/dL). Liver Function Tests: No results for input(s): "AST", "ALT", "ALKPHOS", "BILITOT", "PROT", "ALBUMIN" in the last 168 hours. No results for input(s): "LIPASE", "AMYLASE" in the last 168 hours. No results for input(s): "AMMONIA" in the last 168 hours. Coagulation Profile: Recent Labs  Lab 01/08/23 2319  INR 1.3*   Cardiac Enzymes: No results for input(s): "CKTOTAL", "CKMB", "CKMBINDEX", "TROPONINI" in the last 168 hours. BNP (last 3 results) No results for input(s): "PROBNP" in the last 8760 hours. HbA1C: No results for input(s): "HGBA1C" in the last 72 hours. CBG: No results for input(s): "GLUCAP" in the last 168 hours. Lipid Profile: Recent Labs    01/09/23 0853  CHOL 120  HDL 33*  LDLCALC 68  TRIG 96  CHOLHDL 3.6   Thyroid Function Tests: No results for input(s): "TSH", "T4TOTAL", "FREET4", "T3FREE", "THYROIDAB" in the last 72 hours. Anemia Panel: No results for input(s): "VITAMINB12", "FOLATE", "FERRITIN", "TIBC", "IRON", "RETICCTPCT" in the last 72 hours. Sepsis Labs: Recent Labs  Lab 01/08/23 1941 01/08/23 2319  PROCALCITON <0.10  --   LATICACIDVEN  --  1.3    Recent Results (from the past 240  hour(s))  Culture, blood (x 2)     Status: None (Preliminary result)   Collection Time: 01/08/23 11:19 PM   Specimen: BLOOD  Result Value Ref Range Status   Specimen Description BLOOD RIGHT ANTECUBITAL  Final   Special Requests   Final    BOTTLES DRAWN AEROBIC AND ANAEROBIC Blood Culture adequate volume   Culture   Final    NO GROWTH < 12 HOURS Performed at [facility], [address] [place], [place] [address]    Report Status PENDING  Incomplete         Radiology Studies: CT ANGIO HEAD NECK W WO CM  Result Date: 01/08/2023 CLINICAL DATA:  Carotid artery dissection suspected.  Jaw pain. EXAM: CT ANGIOGRAPHY HEAD AND NECK WITH AND WITHOUT CONTRAST TECHNIQUE: Multidetector CT imaging of the head and neck was performed using the standard protocol during bolus administration of intravenous contrast. Multiplanar CT image reconstructions and MIPs were obtained to evaluate the vascular anatomy. Carotid stenosis measurements (when applicable) are obtained utilizing NASCET criteria, using the distal internal carotid diameter as the denominator. RADIATION DOSE REDUCTION: This exam was performed according to the departmental dose-optimization program which includes automated exposure control, adjustment of the mA and/or kV according to patient size and/or use of iterative reconstruction technique. CONTRAST:  OMNIPAQUE IOHEXOL 350 MG/ML SOLN  COMPARISON:  None Available. FINDINGS: CTA NECK FINDINGS Aortic arch: Standard 3 vessel aortic arch with widely patent arch vessel origins. Right carotid system: Patent without evidence of stenosis or dissection. Retropharyngeal course of the distal common and proximal internal carotid arteries. Left carotid system: Patent without evidence of stenosis or dissection. Retropharyngeal course of the distal common carotid artery. Vertebral arteries: Streak artifact from cervical spine instrumentation obscures portions of both V2 segments. Both vertebral  arteries are patent elsewhere without evidence of a significant stenosis or dissection. Skeleton: Solid C4-C6 ACDF and C5-C7 posterior fusion. Other neck: No evidence of cervical lymphadenopathy or mass. Upper chest: No mass or consolidation in the included lung apices. Review of the MIP images confirms the above findings CTA HEAD FINDINGS Anterior circulation: The internal carotid arteries are widely patent from skull base to carotid termini. ACAs and MCAs are patent without evidence of a proximal branch occlusion or significant proximal stenosis. Posterior circulation: Intracranial vertebral arteries are widely patent to the basilar. Patent bilateral PICA, left AICA, and bilateral SCA origins are visualized. The basilar artery is widely patent. There are patent posterior communicating arteries bilaterally. Both PCAs are patent without evidence of a significant proximal stenosis. No aneurysm is identified. Venous sinuses: Poorly evaluated due to arterial contrast timing. Anatomic variants: None. Review of the MIP images confirms the above findings IMPRESSION: No large vessel occlusion, significant stenosis, or dissection in the head or neck within limitations of streak artifact through the V2 vertebral arteries. Electronically Signed   By: Sebastian Ache M.D.   On: 01/08/2023 19:45   CT Angio Chest/Abd/Pel for Dissection W and/or W/WO  Result Date: 01/08/2023 CLINICAL DATA:  Jaw pain, headache, chest pain EXAM: CT ANGIOGRAPHY CHEST, ABDOMEN AND PELVIS TECHNIQUE: Non-contrast CT of the chest was initially obtained. Multidetector CT imaging through the chest, abdomen and pelvis was performed using the standard protocol during bolus administration of intravenous contrast. Multiplanar reconstructed images and MIPs were obtained and reviewed to evaluate the vascular anatomy. RADIATION DOSE REDUCTION: This exam was performed according to the departmental dose-optimization program which includes automated exposure  control, adjustment of the mA and/or kV according to patient size and/or use of iterative reconstruction technique. CONTRAST:  OMNIPAQUE IOHEXOL 350 MG/ML SOLN COMPARISON:  01/08/2023, 04/04/2022, 04/30/2021 FINDINGS: CTA CHEST FINDINGS Cardiovascular: No evidence of thoracic aortic aneurysm or dissection. Mild cardiomegaly unchanged, with no pericardial effusion. There is technically adequate opacification of the pulmonary vasculature. No filling defects or pulmonary emboli. Mediastinum/Nodes: Stable appearance of the thyroid, trachea, and esophagus. No pathologic mediastinal or hilar adenopathy. Lungs/Pleura: No acute airspace disease, effusion, or pneumothorax. Dependent hypoventilatory changes. Central airways are patent. Musculoskeletal: No acute or destructive bony abnormalities. Stable postsurgical changes at the cervicothoracic junction. Reconstructed images demonstrate no additional findings. Review of the MIP images confirms the above findings. CTA ABDOMEN AND PELVIS FINDINGS VASCULAR Aorta: Normal caliber aorta without aneurysm, dissection, vasculitis or significant stenosis. Mild atherosclerosis. Celiac: Patent without evidence of aneurysm, dissection, vasculitis or significant stenosis. SMA: Patent without evidence of aneurysm, dissection, vasculitis or significant stenosis. Renals: Both renal arteries are patent without evidence of aneurysm, dissection, vasculitis, fibromuscular dysplasia or significant stenosis. IMA: Patent without evidence of aneurysm, dissection, vasculitis or significant stenosis. Inflow: Patent without evidence of aneurysm, dissection, vasculitis or significant stenosis. Veins: No obvious venous abnormality within the limitations of this arterial phase study. Review of the MIP images confirms the above findings. NON-VASCULAR Hepatobiliary: No focal liver abnormality is seen. No gallstones, gallbladder wall thickening, or  biliary dilatation. Pancreas: Unremarkable. No  pancreatic ductal dilatation or surrounding inflammatory changes. Spleen: Normal in size without focal abnormality. Adrenals/Urinary Tract: Right greater than left renal cortical thinning, with areas of right renal cortical scarring. There is mild to moderate right-sided hydronephrosis and hydroureter, likely due to irregular bladder wall thickening extending to the right UVJ. No left-sided obstruction. The adrenals are unremarkable. There is an indwelling catheter decompressing the bladder. Progressive irregular bladder wall thickening is identified anteriorly, consistent with chronic cystitis given chronic indwelling Foley catheter. Since the prior MRI, wall thickening is slightly more pronounced and there is mild perivesicular fat stranding. This could reflect superimposed acute cystitis. The wall thickening extends to the level of the bilateral UVJs, with evidence of right-sided obstructive uropathy as described above. Stomach/Bowel: No bowel obstruction or ileus. Normal appendix right lower quadrant. No bowel wall thickening or inflammatory change. Lymphatic: No pathologic adenopathy. Reproductive: Uterus and bilateral adnexa are unremarkable. Other: No free fluid or free intraperitoneal gas. No abdominal wall hernia. Musculoskeletal: No acute or destructive bony abnormalities. Reconstructed images demonstrate no additional findings. Review of the MIP images confirms the above findings. IMPRESSION: Vascular: 1. No evidence of thoracoabdominal aortic aneurysm or dissection. 2. No evidence of pulmonary embolus. 3.  Aortic Atherosclerosis (ICD10-I70.0). Nonvascular: 1. Likely acute on chronic cystitis, with irregular bladder wall thickening involving the ventral aspect of the bladder and extending posteriorly to the level of the ureterovesicular junctions. 2. Right-sided obstructive uropathy, with progressive right renal cortical scarring. This is likely due to obstruction at the level of the right UVJ due to  chronic cystitis. Urology consultation recommended. 3. No acute intrathoracic process. Electronically Signed   By: Sharlet Salina M.D.   On: 01/08/2023 19:40   CT Head Wo Contrast  Result Date: 01/08/2023 CLINICAL DATA:  Sudden onset severe headache, jaw pain EXAM: CT HEAD WITHOUT CONTRAST TECHNIQUE: Contiguous axial images were obtained from the base of the skull through the vertex without intravenous contrast. RADIATION DOSE REDUCTION: This exam was performed according to the departmental dose-optimization program which includes automated exposure control, adjustment of the mA and/or kV according to patient size and/or use of iterative reconstruction technique. COMPARISON:  04/12/2021 FINDINGS: Brain: No acute infarct or hemorrhage. Lateral ventricles and midline structures are unremarkable. No acute extra-axial fluid collections. No mass effect. Vascular: No hyperdense vessel or unexpected calcification. Skull: Normal. Negative for fracture or focal lesion. Sinuses/Orbits: No acute finding. Other: None. IMPRESSION: 1. Stable head CT, no acute intracranial process. Electronically Signed   By: Sharlet Salina M.D.   On: 01/08/2023 16:59   DG Chest 2 View  Result Date: 01/08/2023 CLINICAL DATA:  Chest pain EXAM: CHEST - 2 VIEW COMPARISON:  Previous studies including the examination of 09/12/2022 FINDINGS: Transverse diameter of heart is increased. There are no signs of pulmonary edema or focal pulmonary consolidation. There is linear density in the lateral aspect of right lower lung field, pleural thickening with no significant change. There is no pleural effusion or pneumothorax. Surgical fusion is seen in lower cervical spine. IMPRESSION: Cardiomegaly. There are no signs of pulmonary edema or focal pulmonary consolidation. Electronically Signed   By: Ernie Avena M.D.   On: 01/08/2023 14:27        Scheduled Meds:  aspirin EC  81 mg Oral Daily   atorvastatin  10 mg Oral Daily    dextromethorphan-guaiFENesin  1 tablet Oral Q12H   DULoxetine  30 mg Oral Daily   DULoxetine  60 mg Oral Daily  linaclotide  290 mcg Oral QAC breakfast   pantoprazole  40 mg Oral Daily   polyethylene glycol  17 g Oral Daily   polyvinyl alcohol  2 drop Both Eyes BID   prednisoLONE acetate  1 drop Left Eye Daily   pregabalin  150 mg Oral BID   senna-docusate  1 tablet Oral BID   traZODone  50 mg Oral QHS   Continuous Infusions:  cefTRIAXone (ROCEPHIN)  IV Stopped (01/09/23 7846)   lactated ringers 100 mL/hr at 01/09/23 1451     LOS: 1 day      Tresa Moore, MD [facility]   If 7PM-7AM, please contact night-coverage  01/09/2023, 3:20 PM

## 2023-01-09 NOTE — Assessment & Plan Note (Addendum)
SIRS, questionable sepsis Following admission patient became hypotensive with systolic drifting down to the 41L, eventually fluid responsive to 101/65.  Other vitals remain unchanged Sepsis workup was initiated.  Urinalysis from exchanged Foley showing large leukocytes esterase lactic acid was 1.3 and procalcitonin less than 0.1 Patient was hospitalized a year ago in April 2023 for UTI and sepsis presenting with hypotension Will treat empirically with ceftriaxone and sepsis fluids Follow urine culture

## 2023-01-09 NOTE — ED Notes (Signed)
Dietary states they're backed up and that they haven't fixed this pt's breakfast tray yet but are getting to it as quickly as they can.

## 2023-01-09 NOTE — ED Notes (Signed)
Pt notified to call son Tevin when able as requested.

## 2023-01-09 NOTE — Consult Note (Signed)
[facility] CARDIOLOGY CONSULT NOTE       Patient ID: Wendy Nelson MRN: [id number] DOB/AGE: 1966/04/24 57 y.o.  Admit date: 01/08/2023 Referring Physician Dr. Lindajo Royal  Primary Physician  Primary Cardiologist Dr. 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 Troponins are elevated and flat trending at 100, 117, for which cardiology is consulted.   The patient states yesterday morning she was laying in bed talking on the phone with her mom when she began to have a headache and pain at "the bottom of her chin."  She also noticed some left-sided sharp chest discomfort which she describes as "feeling like it was coming to my chin" with associated nausea, shortness of breath, and a sensation of difficulty swallowing without overt sore throat/throat pain.  The chest discomfort subsided without intervention, and overnight her chin pain and headache got better, but reports ongoing photosensitivity and has a blanket covering her eyes throughout my entire conversation with her.  At my time of evaluation this morning she feels a little bit better and denies further chest pain, chin or jaw pain, shortness of breath, orthopnea, heart racing or palpitations, or peripheral edema.  In the emergency department patient had extensive workup with CT head which was without acute process,, CTA head, neck, chest, abdomen, and pelvis which was without evidence of aortic dissection but did show concern for acute on chronic cystitis with a right ureteral obstruction.  Initial troponin was elevated at 100, but flat trending on second check at 117.  Her EKG  showed sinus rhythm with a prolonged QT interval and chronically inverted T waves in V2, and V3 compared to priors.  She was given 325 mg aspirin, started on a heparin infusion, and empiric antibiotics for treatment of cystitis.  Review of systems complete and found to be negative unless listed above     Past Medical History:  Diagnosis Date   Quadriplegia (HCC)    Stroke (HCC) 09/2020    Past Surgical History:  Procedure Laterality Date   CERVICAL SPINE SURGERY     JOINT REPLACEMENT Right    Knee    (Not in a hospital admission)  Social History   Socioeconomic History   Marital status: Single    Spouse name: Not on file   Number of children: Not on file   Years of education: Not on file   Highest education level: Not on file  Occupational History   Not on file  Tobacco Use   Smoking status: Never   Smokeless tobacco: Never  Substance and Sexual Activity   Alcohol use: Not on file   Drug use: Not on file   Sexual activity: Not Currently  Other Topics Concern   Not on file  Social History Narrative   Not on file   Social Determinants of Health   Financial Resource Strain: Not on file  Food Insecurity: Not on file  Transportation Needs: Not on file  Physical Activity: Not on file  Stress: Not on file  Social Connections: Not on file  Intimate Partner Violence: Not on file    Family History  Problem Relation Age of Onset   Diabetes Mellitus II Mother  Hypertension Mother    Diabetes Mellitus II Brother       Intake/Output Summary (Last 24 hours) at 01/09/2023 1062 Last data filed at 01/08/2023 2246 Gross per 24 hour  Intake 2100 ml  Output --  Net 2100 ml    Vitals:   01/09/23 0800 01/09/23 0815 01/09/23 0830 01/09/23 0845  BP: 118/61 113/61 (!) 107/57 110/67  Pulse: 63 62 67 64  Resp:  20 17 18   Temp:      TempSrc:      SpO2: 95% 98% 98% 96%  Weight:      Height:        PHYSICAL EXAM General: Pleasant, chronically ill-appearing black  female, well nourished, in no acute distress.  Laying flat in bed with blanket covering eyes. HEENT:  Normocephalic and atraumatic. Neck:  No JVD.  Lungs: Normal respiratory effort on room air.  Decreased breath sounds without appreciable crackles or wheezes.   Heart: HRRR . Normal S1 and S2 without gallops or murmurs.  Abdomen: Non-distended appearing with excess adiposity.  Msk: Normal strength and tone for age. Extremities: Warm and well perfused. No clubbing, cyanosis.  Trace bilateral lower extremity edema.  Neuro: Alert and oriented X 3. Psych: Flat affect.  Answers questions appropriately.   Labs: Basic Metabolic Panel: Recent Labs    01/08/23 1809 01/09/23 0853  NA 138 143  K 3.8 3.5  CL 101 109  CO2 27 26  GLUCOSE 120* 121*  BUN 14 12  CREATININE 0.95 0.82  CALCIUM 8.8* 8.1*   Liver Function Tests: No results for input(s): "AST", "ALT", "ALKPHOS", "BILITOT", "PROT", "ALBUMIN" in the last 72 hours. No results for input(s): "LIPASE", "AMYLASE" in the last 72 hours. CBC: Recent Labs    01/08/23 1809 01/09/23 0853  WBC 11.3* 8.6  HGB 14.4 11.2*  HCT 45.2 35.5*  MCV 87.3 87.0  PLT 233 249   Cardiac Enzymes: Recent Labs    01/08/23 1809 01/08/23 1941 01/09/23 0853  TROPONINIHS 104* 118* 30*   BNP: No results for input(s): "BNP" in the last 72 hours. D-Dimer: No results for input(s): "DDIMER" in the last 72 hours. Hemoglobin A1C: No results for input(s): "HGBA1C" in the last 72 hours. Fasting Lipid Panel: No results for input(s): "CHOL", "HDL", "LDLCALC", "TRIG", "CHOLHDL", "LDLDIRECT" in the last 72 hours. Thyroid Function Tests: No results for input(s): "TSH", "T4TOTAL", "T3FREE", "THYROIDAB" in the last 72 hours.  Invalid input(s): "FREET3" Anemia Panel: No results for input(s): "VITAMINB12", "FOLATE", "FERRITIN", "TIBC", "IRON", "RETICCTPCT" in the last 72 hours.   Radiology: CT ANGIO HEAD NECK W WO CM  Result Date: 01/08/2023 CLINICAL DATA:   Carotid artery dissection suspected.  Jaw pain. EXAM: CT ANGIOGRAPHY HEAD AND NECK WITH AND WITHOUT CONTRAST TECHNIQUE: Multidetector CT imaging of the head and neck was performed using the standard protocol during bolus administration of intravenous contrast. Multiplanar CT image reconstructions and MIPs were obtained to evaluate the vascular anatomy. Carotid stenosis measurements (when applicable) are obtained utilizing NASCET criteria, using the distal internal carotid diameter as the denominator. RADIATION DOSE REDUCTION: This exam was performed according to the departmental dose-optimization program which includes automated exposure control, adjustment of the mA and/or kV according to patient size and/or use of iterative reconstruction technique. CONTRAST:  OMNIPAQUE IOHEXOL 350 MG/ML SOLN COMPARISON:  None Available. FINDINGS: CTA NECK FINDINGS Aortic arch: Standard 3 vessel aortic arch with widely patent arch vessel origins. Right carotid system: Patent without evidence of stenosis or dissection. Retropharyngeal course of the  distal common and proximal internal carotid arteries. Left carotid system: Patent without evidence of stenosis or dissection. Retropharyngeal course of the distal common carotid artery. Vertebral arteries: Streak artifact from cervical spine instrumentation obscures portions of both V2 segments. Both vertebral arteries are patent elsewhere without evidence of a significant stenosis or dissection. Skeleton: Solid C4-C6 ACDF and C5-C7 posterior fusion. Other neck: No evidence of cervical lymphadenopathy or mass. Upper chest: No mass or consolidation in the included lung apices. Review of the MIP images confirms the above findings CTA HEAD FINDINGS Anterior circulation: The internal carotid arteries are widely patent from skull base to carotid termini. ACAs and MCAs are patent without evidence of a proximal branch occlusion or significant proximal stenosis. Posterior circulation:  Intracranial vertebral arteries are widely patent to the basilar. Patent bilateral PICA, left AICA, and bilateral SCA origins are visualized. The basilar artery is widely patent. There are patent posterior communicating arteries bilaterally. Both PCAs are patent without evidence of a significant proximal stenosis. No aneurysm is identified. Venous sinuses: Poorly evaluated due to arterial contrast timing. Anatomic variants: None. Review of the MIP images confirms the above findings IMPRESSION: No large vessel occlusion, significant stenosis, or dissection in the head or neck within limitations of streak artifact through the V2 vertebral arteries. Electronically Signed   By: Sebastian Ache M.D.   On: 01/08/2023 19:45   CT Angio Chest/Abd/Pel for Dissection W and/or W/WO  Result Date: 01/08/2023 CLINICAL DATA:  Jaw pain, headache, chest pain EXAM: CT ANGIOGRAPHY CHEST, ABDOMEN AND PELVIS TECHNIQUE: Non-contrast CT of the chest was initially obtained. Multidetector CT imaging through the chest, abdomen and pelvis was performed using the standard protocol during bolus administration of intravenous contrast. Multiplanar reconstructed images and MIPs were obtained and reviewed to evaluate the vascular anatomy. RADIATION DOSE REDUCTION: This exam was performed according to the departmental dose-optimization program which includes automated exposure control, adjustment of the mA and/or kV according to patient size and/or use of iterative reconstruction technique. CONTRAST:  OMNIPAQUE IOHEXOL 350 MG/ML SOLN COMPARISON:  01/08/2023, 04/04/2022, 04/30/2021 FINDINGS: CTA CHEST FINDINGS Cardiovascular: No evidence of thoracic aortic aneurysm or dissection. Mild cardiomegaly unchanged, with no pericardial effusion. There is technically adequate opacification of the pulmonary vasculature. No filling defects or pulmonary emboli. Mediastinum/Nodes: Stable appearance of the thyroid, trachea, and esophagus. No pathologic  mediastinal or hilar adenopathy. Lungs/Pleura: No acute airspace disease, effusion, or pneumothorax. Dependent hypoventilatory changes. Central airways are patent. Musculoskeletal: No acute or destructive bony abnormalities. Stable postsurgical changes at the cervicothoracic junction. Reconstructed images demonstrate no additional findings. Review of the MIP images confirms the above findings. CTA ABDOMEN AND PELVIS FINDINGS VASCULAR Aorta: Normal caliber aorta without aneurysm, dissection, vasculitis or significant stenosis. Mild atherosclerosis. Celiac: Patent without evidence of aneurysm, dissection, vasculitis or significant stenosis. SMA: Patent without evidence of aneurysm, dissection, vasculitis or significant stenosis. Renals: Both renal arteries are patent without evidence of aneurysm, dissection, vasculitis, fibromuscular dysplasia or significant stenosis. IMA: Patent without evidence of aneurysm, dissection, vasculitis or significant stenosis. Inflow: Patent without evidence of aneurysm, dissection, vasculitis or significant stenosis. Veins: No obvious venous abnormality within the limitations of this arterial phase study. Review of the MIP images confirms the above findings. NON-VASCULAR Hepatobiliary: No focal liver abnormality is seen. No gallstones, gallbladder wall thickening, or biliary dilatation. Pancreas: Unremarkable. No pancreatic ductal dilatation or surrounding inflammatory changes. Spleen: Normal in size without focal abnormality. Adrenals/Urinary Tract: Right greater than left renal cortical thinning, with areas of right renal cortical scarring.  There is mild to moderate right-sided hydronephrosis and hydroureter, likely due to irregular bladder wall thickening extending to the right UVJ. No left-sided obstruction. The adrenals are unremarkable. There is an indwelling catheter decompressing the bladder. Progressive irregular bladder wall thickening is identified anteriorly, consistent with  chronic cystitis given chronic indwelling Foley catheter. Since the prior MRI, wall thickening is slightly more pronounced and there is mild perivesicular fat stranding. This could reflect superimposed acute cystitis. The wall thickening extends to the level of the bilateral UVJs, with evidence of right-sided obstructive uropathy as described above. Stomach/Bowel: No bowel obstruction or ileus. Normal appendix right lower quadrant. No bowel wall thickening or inflammatory change. Lymphatic: No pathologic adenopathy. Reproductive: Uterus and bilateral adnexa are unremarkable. Other: No free fluid or free intraperitoneal gas. No abdominal wall hernia. Musculoskeletal: No acute or destructive bony abnormalities. Reconstructed images demonstrate no additional findings. Review of the MIP images confirms the above findings. IMPRESSION: Vascular: 1. No evidence of thoracoabdominal aortic aneurysm or dissection. 2. No evidence of pulmonary embolus. 3.  Aortic Atherosclerosis (ICD10-I70.0). Nonvascular: 1. Likely acute on chronic cystitis, with irregular bladder wall thickening involving the ventral aspect of the bladder and extending posteriorly to the level of the ureterovesicular junctions. 2. Right-sided obstructive uropathy, with progressive right renal cortical scarring. This is likely due to obstruction at the level of the right UVJ due to chronic cystitis. Urology consultation recommended. 3. No acute intrathoracic process. Electronically Signed   By: Sharlet Salina M.D.   On: 01/08/2023 19:40   CT Head Wo Contrast  Result Date: 01/08/2023 CLINICAL DATA:  Sudden onset severe headache, jaw pain EXAM: CT HEAD WITHOUT CONTRAST TECHNIQUE: Contiguous axial images were obtained from the base of the skull through the vertex without intravenous contrast. RADIATION DOSE REDUCTION: This exam was performed according to the departmental dose-optimization program which includes automated exposure control, adjustment of the mA  and/or kV according to patient size and/or use of iterative reconstruction technique. COMPARISON:  04/12/2021 FINDINGS: Brain: No acute infarct or hemorrhage. Lateral ventricles and midline structures are unremarkable. No acute extra-axial fluid collections. No mass effect. Vascular: No hyperdense vessel or unexpected calcification. Skull: Normal. Negative for fracture or focal lesion. Sinuses/Orbits: No acute finding. Other: None. IMPRESSION: 1. Stable head CT, no acute intracranial process. Electronically Signed   By: Sharlet Salina M.D.   On: 01/08/2023 16:59   DG Chest 2 View  Result Date: 01/08/2023 CLINICAL DATA:  Chest pain EXAM: CHEST - 2 VIEW COMPARISON:  Previous studies including the examination of 09/12/2022 FINDINGS: Transverse diameter of heart is increased. There are no signs of pulmonary edema or focal pulmonary consolidation. There is linear density in the lateral aspect of right lower lung field, pleural thickening with no significant change. There is no pleural effusion or pneumothorax. Surgical fusion is seen in lower cervical spine. IMPRESSION: Cardiomegaly. There are no signs of pulmonary edema or focal pulmonary consolidation. Electronically Signed   By: Ernie Avena M.D.   On: 01/08/2023 14:27   MR Pelvis W Wo Contrast  Result Date: 12/30/2022 CLINICAL DATA:  Urinary dysfunction. Permanent bladder catheter. Urethral diverticulum suspected on pelvic exam. EXAM: MRI PELVIS WITHOUT AND WITH CONTRAST TECHNIQUE: Multiplanar multisequence MR imaging of the pelvis was performed both before and after administration of intravenous contrast. CONTRAST:  10mL GADAVIST GADOBUTROL 1 MMOL/ML IV SOLN COMPARISON:  None Available. FINDINGS: Lower Urinary Tract: Foley catheter is seen within the bladder. Mild diffuse bladder wall thickening and mucosal enhancement is consistent with cystitis.  No focal bladder mass visualized. No evidence of urethral diverticulum. Bowel: Unremarkable pelvic bowel  loops. Vascular/Lymphatic: Unremarkable. No pathologically enlarged pelvic lymph nodes identified. Reproductive: -- Uterus: Measures 7.9 x 3.2 by 5.7 cm (volume = 75 cm^3). No fibroids or other masses identified. Cervix and vagina are unremarkable. -- Right ovary: Appears normal. No ovarian or adnexal masses identified. -- Left ovary: Appears normal. No ovarian or adnexal masses identified. Other: No peritoneal thickening or abnormal free fluid. Musculoskeletal:  Unremarkable. IMPRESSION: Mild diffuse bladder wall thickening and mucosal enhancement, consistent with cystitis. No evidence of urethral diverticulum. Normal appearance of uterus and both ovaries. No evidence of pelvic mass. Electronically Signed   By: Danae Orleans M.D.   On: 12/30/2022 11:55    [facility] 2018  Narrative  This result has an attachment that is not available. Impressions:  1. Mild, nonobstructive CAD 2. LAD and diagonals are corkscrew/tortuous but without flow limiting lesions appreciated 3. Normal LVEDP = 10 mmHg 4. Moderate sedation with IV benadryl, fentanyl, and Versed personally supervised by me for a total of 26 minutes  Recommendations:  1. Medical therapy 2. Risk factor modification 3. Evaluate for other etiologies for symptoms Procedure Note  Alonna Buckler, MD - 12/21/2016  Steffanie Dunn 12/11/2016  Myocardial perfusion imaging is Abnormal- consistent with a small area of  ischemia in the anterior septal wall, but sparing the apex (diagonal  branch distribution).  Summed severity score is 11.  Artifacts noted:  breast attenuation.  Overall left ventricular systolic function was Normal without regional  wall motion abnormalities (see above).   Interpretation By:  Gerrianne Scale, MD   Electronically signed Dec 12, 2016 at 6:55 AM   ECHO 12/21/2016 DOPPLER ECHO and OTHER SPECIAL PROCEDURES     Aortic:No AR                         No AS      Mitral:No MR                         No  MS            MV Inflow E Vel=75.2 cm/sec   MV Annulus E'Vel=5.7 cm/sec            E/E'Ratio=13.3   Tricuspid:TRIVIAL TR                    No TS   Pulmonary:No PR                         No PS   _____________________________________________________________ INTERPRETATION  NORMAL LEFT VENTRICULAR SYSTOLIC FUNCTION   WITH MILD LVH  NORMAL RIGHT VENTRICULAR SYSTOLIC FUNCTION  NO VALVULAR STENOSIS  TRIVIAL REGURGITATION NOTED (See above)  NO PERICARDIAL EFFUSION  NO CHANGES FROM PREVIOUS ECHO   Electronically signed by: Erskine Speed, MD on 12/21/2016 12:43 PM              Performed By: Vivianne Spence, RDCS        Ordering Physician: Annia Friendly   TELEMETRY reviewed by me (LT) 01/09/2023 : Sinus rhythm rate 60s to 70s  EKG reviewed by me: NSR with nonspecific T wave changes, chronically inverted T in lead V2 & V3   Data reviewed by me (LT) 01/09/2023: last cardiology note from 2018, ed physician note, admission H&P, last 24h vitals tele labs imaging I/O  Principal Problem:   NSTEMI (non-ST elevated myocardial infarction) (HCC) Active Problems:   HTN (hypertension)   Depression with anxiety   Neurogenic bladder   Complicated UTI (urinary tract infection)   Quadriplegia, 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 Troponins are elevated and flat trending at 100, 117, for which cardiology is consulted.   # Acute on chronic cystitis # Neurogenic  bladder with indwelling Foley Agree with current therapy per primary team  # Headache # Demand ischemia # History of nonobstructive CAD by [facility] 2018 Presents mainly with headache and jaw/chin pain and short-lived sharp left-sided chest discomfort that radiated up to her jaw with associated nausea, that terminated without intervention.  EKGs show chronic abnormalities dating back to 2018.  Troponins are elevated and flat trending at 100, 117 with a third repeat pending which is most consistent with demand/supply mismatch and not ACS.  -Check a third troponin, if flat/downtrending, okay to DC heparin -Continue 81 mg aspirin daily -Continue atorvastatin 10 mg daily -Risk factor modification: lipid panel, A1c -Echo complete -Defer additional invasive cardiac diagnostics at this time.  This patient's plan of care was discussed and created with Dr. Juliann Pares and he is in agreement.  Signed: Rebeca Allegra , PA-C 01/09/2023, 9:38 AM [facility]

## 2023-01-09 NOTE — ED Notes (Signed)
Pt notified mother called wanting her to call her back. Delaney Meigs [phone]

## 2023-01-09 NOTE — ED Notes (Signed)
Pt requested assistance with setting up her lunch tray. Tray adjusted for pt and cut up pt's food. Pt denies any other needs currently.

## 2023-01-09 NOTE — ED Notes (Signed)
EKG to provider Sreenath in person.

## 2023-01-09 NOTE — ED Notes (Signed)
Ian Malkin, RN at bedside adjusting pt's IV since pump alarming.

## 2023-01-09 NOTE — ED Notes (Signed)
Pt reporting mild nausea. HOB adjusted at pt's request.

## 2023-01-09 NOTE — ED Notes (Signed)
Pt didn't eat much from breakfast tray and hasn't touched her lunch tray yet. Pt given hospital phone to talk with mother since hasn't called family back yet. Mother talking with pt now.

## 2023-01-09 NOTE — ED Notes (Signed)
Attempted to drop off EKG to an EDP without success as currently none available; all in rooms x 2 tries.

## 2023-01-09 NOTE — Progress Notes (Signed)
ANTICOAGULATION CONSULT NOTE  Pharmacy Consult for heparin infusion Indication: chest pain/ACS  Allergies  Allergen Reactions   Melatonin    Misc. Sulfonamide Containing Compounds    Neomycin Other (See Comments)    Positive patch test   Sulfa Antibiotics Itching    Patient Measurements: Height: 5\' 5"  (165.1 cm) Weight: (!) 138.8 kg (306 lb) IBW/kg (Calculated) : 57 Heparin Dosing Weight: 91.5 kg  Vital Signs: Temp: 98 F (36.7 C) (06/26 0146) Temp Source: Oral (06/26 0146) BP: 103/63 (06/26 0615) Pulse Rate: 63 (06/26 0615)  Labs: Recent Labs    01/08/23 1809 01/08/23 1941 01/08/23 2319 01/09/23 0522  HGB 14.4  --   --   --   HCT 45.2  --   --   --   PLT 233  --   --   --   APTT  --   --  >200* 162*  LABPROT  --   --  16.8*  --   INR  --   --  1.3*  --   HEPARINUNFRC  --   --  >1.10* >1.10*  CREATININE 0.95  --   --   --   TROPONINIHS 104* 118*  --   --      Estimated Creatinine Clearance: 94.7 mL/min (by C-G formula based on SCr of 0.95 mg/dL).   Medical History: Past Medical History:  Diagnosis Date   Quadriplegia [facility])    Stroke (HCC) 09/2020    Assessment: 57 y.o. female w/ PMH of HTN, HLD, quadriplegia secondary to C6/7 spinal cord infarction after fusion who presents to the ED complaining of headache and jaw pain. She is on apixaban PTA w/ last dose 06/25 am at undetermined time  Goal of Therapy:  Heparin level 0.3-0.7 units/ml aPTT 66 - 102  seconds Monitor platelets by anticoagulation protocol: Yes   6/26 0522 HL >1.10/aPTT 162, supratherapeutic @ 1250 u/hr  Plan:  ---Hold heparin infusion x 1 hour ---Resume heparin at reduced rate of 1050 units/hr ---Check aPTT level 6 hours after drip is resumed (we will use aPTT levels to guide therapy given apixaban exposure) ---Check anti-Xa once daily to evaluate correlation with aPTT ---Continue to monitor H&H and platelets  Barrie Folk, PharmD 01/09/2023,6:48 AM

## 2023-01-09 NOTE — ED Notes (Signed)
Cards PA at bedside. 

## 2023-01-09 NOTE — ED Notes (Signed)
Pt's bed pad wet with large amount of urine and smear of BM; peri care provided, bed linens and pads changed, pt repositioned and boosted in bed, given 2nd pillow for head/neck as requested. 3 RN's present assisting during process.

## 2023-01-09 NOTE — ED Notes (Signed)
Pt boosted in bed by this RN and [employer]isure [employer] manager.

## 2023-01-10 ENCOUNTER — Inpatient Hospital Stay
Admit: 2023-01-10 | Discharge: 2023-01-10 | Disposition: A | Discharge status: Home / Self Care | Payer: 59 | Attending: Cardiology | Admitting: Cardiology

## 2023-01-10 DIAGNOSIS — I214 Non-ST elevation (NSTEMI) myocardial infarction: Secondary | ICD-10-CM | POA: Diagnosis not present

## 2023-01-10 LAB — CBC
HCT: 36.8 % (ref 36.0–46.0)
Hemoglobin: 11.4 g/dL — ABNORMAL LOW (ref 12.0–15.0)
MCH: 27.5 pg (ref 26.0–34.0)
MCHC: 31 g/dL (ref 30.0–36.0)
MCV: 88.7 fL (ref 80.0–100.0)
Platelets: 251 10*3/uL (ref 150–400)
RBC: 4.15 MIL/uL (ref 3.87–5.11)
RDW: 15.8 % — ABNORMAL HIGH (ref 11.5–15.5)
WBC: 7.9 10*3/uL (ref 4.0–10.5)
nRBC: 0 % (ref 0.0–0.2)

## 2023-01-10 LAB — URINE CULTURE

## 2023-01-10 LAB — CULTURE, BLOOD (ROUTINE X 2): Special Requests: ADEQUATE

## 2023-01-10 LAB — LIPOPROTEIN A (LPA): Lipoprotein (a): 219 nmol/L — ABNORMAL HIGH (ref <75.0)

## 2023-01-10 MED ORDER — SODIUM CHLORIDE 0.9 % IV SOLN
2.0000 g | Freq: Four times a day (QID) | INTRAVENOUS | Status: DC
  Filled 2023-01-10: qty 2000

## 2023-01-10 MED ORDER — LIDOCAINE 5 % EX PTCH
1.0000 | MEDICATED_PATCH | CUTANEOUS | Status: DC
  Administered 2023-01-10 – 2023-01-11 (×2): 1 via TRANSDERMAL
  Filled 2023-01-10 (×3): qty 1

## 2023-01-10 MED ORDER — SODIUM CHLORIDE 0.9 % IV SOLN
1.0000 g | Freq: Four times a day (QID) | INTRAVENOUS | Status: DC
  Administered 2023-01-10 – 2023-01-12 (×7): 1 g via INTRAVENOUS
  Filled 2023-01-10 (×8): qty 1000

## 2023-01-10 NOTE — TOC Initial Note (Signed)
Transition of Care [facility]) - Initial/Assessment Note    Patient Details  Name: Wendy Nelson MRN: [id number] Date of Birth: 01/21/1966  Transition of Care [facility]) CM/SW Contact:    Margarito Liner, LCSW Phone Number: 01/10/2023, 3:20 PM  Clinical Narrative:  CSW met with patient. No supports at bedside. CSW introduced role and explained that discharge planning would be discussed. Patient confirmed she is a long-term resident at [facility]. She stated she has lived there over two years. No further concerns. CSW encouraged patient to contact CSW as needed. CSW will continue to follow patient for support and facilitate return to SNF once medically stable.                Expected Discharge Plan: Skilled Nursing Facility Barriers to Discharge: Continued Medical Work up   Patient Goals and CMS Choice            Expected Discharge Plan and Services     Post Acute Care Choice: Resumption of Svcs/PTA Provider Living arrangements for the past 2 months: Skilled Nursing Facility                                      Prior Living Arrangements/Services Living arrangements for the past 2 months: Skilled Nursing Facility Lives with:: Facility Resident Patient language and need for interpreter reviewed:: Yes Do you feel safe going back to the place where you live?: Yes      Need for Family Participation in Patient Care: Yes (Comment) Care giver support system in place?: Yes (comment)   Criminal Activity/Legal Involvement Pertinent to Current Situation/Hospitalization: No - Comment as needed  Activities of Daily Living Home Assistive Devices/Equipment: Wheelchair, Hospital bed ADL Screening (condition at time of admission) Patient's cognitive ability adequate to safely complete daily activities?: Yes Is the patient deaf or have difficulty hearing?: No Does the patient have difficulty seeing, even when wearing glasses/contacts?: No Does the patient have difficulty concentrating,  remembering, or making decisions?: No Patient able to express need for assistance with ADLs?: Yes Does the patient have difficulty dressing or bathing?: Yes Independently performs ADLs?: No Communication: Independent Dressing (OT): Dependent Is this a change from baseline?: Pre-admission baseline Grooming: Dependent Is this a change from baseline?: Pre-admission baseline Feeding: Independent Bathing: Dependent Is this a change from baseline?: Pre-admission baseline Toileting: Dependent Is this a change from baseline?: Pre-admission baseline In/Out Bed: Dependent Is this a change from baseline?: Pre-admission baseline Walks in Home: Dependent Is this a change from baseline?: Pre-admission baseline Does the patient have difficulty walking or climbing stairs?: Yes Weakness of Legs: Both Weakness of Arms/Hands: None  Permission Sought/Granted Permission sought to share information with : Facility Industrial/product designer granted to share information with : Yes, Verbal Permission Granted     Permission granted to share info w AGENCY: [facility]        Emotional Assessment Appearance:: Appears stated age Attitude/Demeanor/Rapport: Engaged Affect (typically observed): Accepting, Appropriate, Calm Orientation: : Oriented to Self, Oriented to Place, Oriented to  Time, Oriented to Situation Alcohol / Substance Use: Not Applicable Psych Involvement: No (comment)  Admission diagnosis:  Chronic cystitis [N30.20] NSTEMI (non-ST elevated myocardial infarction) (HCC) [I21.4] Acute nonintractable headache, unspecified headache type [R51.9] Patient Active Problem List   Diagnosis Date Noted   NSTEMI (non-ST elevated myocardial infarction) (HCC) 01/08/2023   Morbid obesity with BMI of 50.0-59.9, adult (HCC) 01/08/2023  Chronic indwelling Foley catheter 01/08/2023   Headache 01/08/2023   Jaw pain 01/08/2023   Lactic acidosis 10/31/2021   Complicated UTI (urinary tract  infection) 10/30/2021   SIRS (systemic inflammatory response syndrome), possible sepsis(HCC)    Quadriplegia, 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 09/22/21 (HCC)    Sepsis (HCC) 12/23/2020   Neurogenic bladder    Syncope 10/19/2020   AKI (acute kidney injury) (HCC) 10/19/2020   Bilateral hydronephrosis 10/19/2020   HTN (hypertension) 10/19/2020   HLD (hyperlipidemia) 10/19/2020   Depression with anxiety 10/19/2020   Hypotension 09/2020   Cervical spondylosis with myelopathy and radiculopathy 09/2020   Spinal cord infarction (HCC) 09/2020   PCP:  [facility]:   [facility] - [place], [place] - [address] [facility] [address] [facility] [place] [address] Phone: [phone] Fax: [phone]     Social Determinants of Health (SDOH) Social History: SDOH Screenings   Food Insecurity: No Food Insecurity (01/09/2023)  Housing: Low Risk  (01/09/2023)  Transportation Needs: No Transportation Needs (01/09/2023)  Utilities: Not At Risk (01/09/2023)  Tobacco Use: Low Risk  (01/08/2023)   SDOH Interventions:     Readmission Risk Interventions     No data to display

## 2023-01-10 NOTE — [place] FL2 (Signed)
[place] MEDICAID FL2 LEVEL OF CARE FORM     IDENTIFICATION  Patient Name: Wendy Nelson Birthdate: [birth year] Sex: female Admission Date (Current Location): 01/08/2023  [facility] [place] and [place] Number:  Chiropodist and Address:  [facility], [address] [place], [place] [address]      Provider Number: [id number]  Attending Physician Name and Address:  Tresa Moore, MD  Relative Name and Phone Number:       Current Level of Care: Hospital Recommended Level of Care: Skilled Nursing Facility Prior Approval Number:    Date Approved/Denied:   PASRR Number: [id number]  Discharge Plan: SNF    Current Diagnoses: Patient Active Problem List   Diagnosis Date Noted   NSTEMI (non-ST elevated myocardial infarction) (HCC) 01/08/2023   Morbid obesity with BMI of 50.0-59.9, adult (HCC) 01/08/2023   Chronic indwelling Foley catheter 01/08/2023   Headache 01/08/2023   Jaw pain 01/08/2023   Lactic acidosis 10/31/2021   Complicated UTI (urinary tract infection) 10/30/2021   SIRS (systemic inflammatory response syndrome), possible sepsis(HCC)    Quadriplegia, 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 09/22/21 (HCC)    Sepsis (HCC) 12/23/2020   Neurogenic bladder    Syncope 10/19/2020   AKI (acute kidney injury) (HCC) 10/19/2020   Bilateral hydronephrosis 10/19/2020   HTN (hypertension) 10/19/2020   HLD (hyperlipidemia) 10/19/2020   Depression with anxiety 10/19/2020   Hypotension 09/2020   Cervical spondylosis with myelopathy and radiculopathy 09/2020   Spinal cord infarction (HCC) 09/2020    Orientation RESPIRATION BLADDER Height & Weight     Self, Time, Situation, Place  Normal Incontinent, Indwelling catheter Weight: (!) 306 lb (138.8 kg) Height:  5\' 5"  (165.1 cm)  BEHAVIORAL SYMPTOMS/MOOD NEUROLOGICAL BOWEL NUTRITION STATUS   (None)  (None) Continent Diet  AMBULATORY STATUS  COMMUNICATION OF NEEDS Skin     Verbally Normal                       Personal Care Assistance Level of Assistance              Functional Limitations Info  Sight, Hearing, Speech Sight Info: Adequate Hearing Info: Adequate Speech Info: Adequate    SPECIAL CARE FACTORS FREQUENCY                       Contractures Contractures Info: Not present    Additional Factors Info  Code Status, Allergies, Isolation Precautions Code Status Info: Full code Allergies Info: Melatonin, Misc Sulfonamide Containing Compounds, Neomycin, Sulfa Antibiotics     Isolation Precautions Info: Contact: VRE     Current Medications (01/10/2023):  This is the current hospital active medication list Current Facility-Administered Medications  Medication Dose Route Frequency Provider Last Rate Last Admin   acetaminophen (TYLENOL) tablet 650 mg  650 mg Oral Q6H PRN Andris Baumann, MD       Or   acetaminophen (TYLENOL) suppository 650 mg  650 mg Rectal Q6H PRN Andris Baumann, MD       acetaminophen (TYLENOL) tablet 650 mg  650 mg Oral Q4H PRN Andris Baumann, MD       ampicillin (OMNIPEN) 1 g in sodium chloride 0.9 % 100 mL IVPB  1 g Intravenous Q6H Sreenath, Sudheer B, MD       aspirin EC tablet 81 mg  81 mg Oral Daily Andris Baumann, MD   81 mg at 01/10/23 0840  atorvastatin (LIPITOR) tablet 10 mg  10 mg Oral Daily Lindajo Royal V, MD   10 mg at 01/10/23 0840   benzonatate (TESSALON) capsule 100 mg  100 mg Oral Daily PRN Lolita Patella B, MD       bisacodyl (DULCOLAX) suppository 10 mg  10 mg Rectal Daily PRN Lolita Patella B, MD       Chlorhexidine Gluconate Cloth 2 % PADS 6 each  6 each Topical Daily Andris Baumann, MD   6 each at 01/10/23 0850   dextromethorphan-guaiFENesin (MUCINEX DM) 30-600 MG per 12 hr tablet 1 tablet  1 tablet Oral Q12H Lolita Patella B, MD   1 tablet at 01/10/23 0849   DULoxetine (CYMBALTA) DR capsule 30 mg  30 mg Oral Daily Lindajo Royal V, MD   30 mg  at 01/10/23 0849   DULoxetine (CYMBALTA) DR capsule 60 mg  60 mg Oral Daily Lindajo Royal V, MD   60 mg at 01/10/23 0839   enoxaparin (LOVENOX) injection 70 mg  0.5 mg/kg Subcutaneous Q24H Lolita Patella B, MD   70 mg at 01/09/23 2117   fluticasone (FLONASE) 50 MCG/ACT nasal spray 1 spray  1 spray Each Nare Daily PRN Lolita Patella B, MD       ipratropium-albuterol (DUONEB) 0.5-2.5 (3) MG/3ML nebulizer solution 3 mL  3 mL Nebulization Q4H PRN Andris Baumann, MD       lactated ringers infusion   Intravenous Continuous Lolita Patella B, MD 100 mL/hr at 01/10/23 1131 Infusion Verify at 01/10/23 1131   linaclotide (LINZESS) capsule 290 mcg  290 mcg Oral QAC breakfast Lolita Patella B, MD   290 mcg at 01/10/23 0841   meclizine (ANTIVERT) tablet 25 mg  25 mg Oral Daily PRN Lolita Patella B, MD       morphine (PF) 2 MG/ML injection 2 mg  2 mg Intravenous Q2H PRN Andris Baumann, MD       nitroGLYCERIN (NITROSTAT) SL tablet 0.4 mg  0.4 mg Sublingual Q5 Min x 3 PRN Andris Baumann, MD       ondansetron [facility]) injection 4 mg  4 mg Intravenous Q6H PRN Andris Baumann, MD   4 mg at 01/09/23 1452   oxyCODONE (Oxy IR/ROXICODONE) immediate release tablet 5 mg  5 mg Oral Q4H PRN Lolita Patella B, MD   5 mg at 01/10/23 1435   pantoprazole (PROTONIX) EC tablet 40 mg  40 mg Oral Daily Lolita Patella B, MD   40 mg at 01/10/23 0838   polyethylene glycol (MIRALAX / GLYCOLAX) packet 17 g  17 g Oral Daily Lolita Patella B, MD   17 g at 01/10/23 0841   polyvinyl alcohol (LIQUIFILM TEARS) 1.4 % ophthalmic solution 2 drop  2 drop Both Eyes BID Lolita Patella B, MD   2 drop at 01/10/23 0849   prednisoLONE acetate (PRED FORTE) 1 % ophthalmic suspension 1 drop  1 drop Left Eye Daily Georgeann Oppenheim, Sudheer B, MD   1 drop at 01/10/23 1131   pregabalin (LYRICA) capsule 150 mg  150 mg Oral BID Lolita Patella B, MD   150 mg at 01/10/23 6578   senna-docusate (Senokot-S) tablet 1 tablet  1 tablet Oral BID  Lolita Patella B, MD   1 tablet at 01/10/23 0850   traZODone (DESYREL) tablet 50 mg  50 mg Oral QHS Andris Baumann, MD   50 mg at 01/09/23 2116     Discharge Medications: Please see discharge summary for a list of discharge  medications.  Relevant Imaging Results:  Relevant Lab Results:   Additional Information SS#: [id number]  Margarito Liner, LCSW

## 2023-01-10 NOTE — Plan of Care (Signed)
  Problem: Education: Goal: Knowledge of General Education information will improve Description: Including pain rating scale, medication(s)/side effects and non-pharmacologic comfort measures Outcome: Progressing   Problem: Clinical Measurements: Goal: Respiratory complications will improve Outcome: Progressing   Problem: Clinical Measurements: Goal: Cardiovascular complication will be avoided Outcome: Progressing   Problem: Nutrition: Goal: Adequate nutrition will be maintained Outcome: Progressing   Problem: Elimination: Goal: Will not experience complications related to urinary retention Outcome: Progressing   Problem: Pain Managment: Goal: General experience of comfort will improve Outcome: Progressing   Problem: Safety: Goal: Ability to remain free from injury will improve Outcome: Progressing

## 2023-01-10 NOTE — Progress Notes (Signed)
PROGRESS NOTE    Wendy Nelson  OZH:[id number] DOB: Nov 17, 1965 DOA: 01/08/2023 PCP: [facility]    Brief Narrative:   57 y.o. 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  She initially had chest pain but this resolved by admission.  She has some associated shortness of breath.  Denies nausea vomiting and diaphoresis. ED course and data review: BP 160/100 with otherwise normal vitals.Notable labs include troponin 104> 118.  WBC 11,000.      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  Troponin 104-114 and EKG with nonspecific ST-T wave changes.  These nonspecific changes have been present in EKG since 2018.  Suspect supply/demand ischemia in the setting of complicated urinary tract infection Plan: Continue aspirin and statin Sublingual nitro as needed chest pain 2D echocardiogram, pending No further invasive cardiac diagnostics   Complicated UTI (urinary tract infection) SIRS, questionable sepsis Following admission patient became hypotensive with systolic drifting down to the 62X, eventually fluid responsive to 101/65.  Other vitals remain unchanged Sepsis workup was  initiated.  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  Decubitus precautions   Neurogenic bladder Secondary to spinal cord infarct.   Foley exchanged in the ED   Depression with anxiety Continue duloxetine and trazodone   HTN (hypertension) Continue antihypertensives  Morbid obesity BMI 50.92.  This complicates overall care and prognosis   DVT prophylaxis: SQ Lovenox Code Status: Full Family Communication: None Disposition Plan: Status is: Inpatient Remains inpatient appropriate because: Complicated UTI   Level of care: Progressive  Consultants:  Cardiology  Procedures:  None  Antimicrobials: Ceftriaxone   Subjective: Seen and examined.  Feeling better this morning  Objective: Vitals:   01/10/23 0322 01/10/23 0840 01/10/23 0925 01/10/23 1000  BP: 108/67     Pulse: 79     Resp:  18 18   Temp: 97.9 F (36.6 C)   (!) 97.3 F (36.3 C)  TempSrc: Oral   Oral  SpO2: 92%     Weight:      Height:        Intake/Output Summary (Last 24 hours) at 01/10/2023 1116 Last data filed at 01/10/2023 1005 Gross per 24 hour  Intake 1439.86 ml  Output 750 ml  Net 689.86 ml   [employer]  01/08/23 1335  Weight: (!) 138.8 kg    Examination:  General exam: No acute distress.  Appears frail chronically ill Respiratory system: Diminished lung sounds.  Poor respiratory effort.  Normal work of  breathing.  Room air Cardiovascular system: S1-S2, RRR, no murmurs, no pedal edema Gastrointestinal system: Obese, soft, NT/ND Central nervous system: Alert and oriented.  C7 quadriplegia extremities: 0X5 power bilaterally lower extremities Skin: No rashes, lesions or ulcers Psychiatry: Judgement and insight appear normal. Mood & affect flattened.     Data Reviewed: I have personally reviewed following labs and imaging studies  CBC: Recent Labs  Lab 01/08/23 1809 01/09/23 0853 01/10/23 0357  WBC 11.3* 8.6 7.9  HGB 14.4 11.2* 11.4*  HCT 45.2 35.5* 36.8  MCV 87.3 87.0 88.7  PLT 233 249 251   Basic Metabolic Panel: Recent Labs  Lab 01/08/23 1809 01/09/23 0853  NA 138 143  K 3.8 3.5  CL 101 109  CO2 27 26  GLUCOSE 120* 121*  BUN 14 12  CREATININE 0.95 0.82  CALCIUM 8.8* 8.1*   GFR: Estimated Creatinine Clearance: 109.8 mL/min (by C-G formula based on SCr of 0.82 mg/dL). Liver Function Tests: No results for input(s): "AST", "ALT", "ALKPHOS", "BILITOT", "PROT", "ALBUMIN" in the last 168 hours. No results for input(s): "LIPASE", "AMYLASE" in the last 168 hours. No results for input(s): "AMMONIA" in the last 168 hours. Coagulation Profile: Recent Labs  Lab 01/08/23 2319  INR 1.3*   Cardiac Enzymes: No results for input(s): "CKTOTAL", "CKMB", "CKMBINDEX", "TROPONINI" in the last 168 hours. BNP (last 3 results) No results for input(s): "PROBNP" in the last 8760 hours. HbA1C: No results for input(s): "HGBA1C" in the last 72 hours. CBG: No results for input(s): "GLUCAP" in the last 168 hours. Lipid Profile: Recent Labs    01/09/23 0853  CHOL 120  HDL 33*  LDLCALC 68  TRIG 96  CHOLHDL 3.6   Thyroid Function Tests: No results for input(s): "TSH", "T4TOTAL", "FREET4", "T3FREE", "THYROIDAB" in the last 72 hours. Anemia Panel: No results for input(s): "VITAMINB12", "FOLATE", "FERRITIN", "TIBC", "IRON", "RETICCTPCT" in the last 72 hours. Sepsis Labs: Recent Labs   Lab 01/08/23 1941 01/08/23 2319  PROCALCITON <0.10  --   LATICACIDVEN  --  1.3    Recent Results (from the past 240 hour(s))  Urine Culture     Status: Abnormal (Preliminary result)   Collection Time: 01/08/23  8:12 PM   Specimen: Urine, Catheterized  Result Value Ref Range Status   Specimen Description   Final    URINE, CATHETERIZED Performed at [facility], [address], [place], [place] [address]    Special Requests   Final    NONE Performed at [facility], [address] [place], [place] [address]    Culture (A)  Final    >=100,000 COLONIES/mL ESCHERICHIA COLI 80,000 COLONIES/mL ENTEROCOCCUS FAECALIS SUSCEPTIBILITIES TO FOLLOW Performed at [facility], [address], [place], [place] [address]    Report Status PENDING  Incomplete  Culture, blood (x 2)     Status: None (Preliminary result)   Collection Time: 01/08/23 11:19 PM   Specimen: BLOOD  Result Value Ref Range Status   Specimen Description BLOOD RIGHT ANTECUBITAL  Final   Special Requests   Final    BOTTLES DRAWN AEROBIC AND ANAEROBIC Blood Culture adequate volume   Culture   Final    NO GROWTH 2 DAYS Performed at [facility], [address], [place], [place] [address]  Report Status PENDING  Incomplete  Culture, blood (x 2)     Status: None (Preliminary result)   Collection Time: 01/09/23  5:46 PM   Specimen: BLOOD  Result Value Ref Range Status   Specimen Description BLOOD BLOOD LEFT HAND  Final   Special Requests   Final    BOTTLES DRAWN AEROBIC AND ANAEROBIC Blood Culture adequate volume   Culture   Final    NO GROWTH < 24 HOURS Performed at [facility], [address], [place], [place] [address]    Report Status PENDING  Incomplete         Radiology Studies: CT ANGIO HEAD NECK W WO CM  Result Date: 01/08/2023 CLINICAL DATA:  Carotid artery dissection suspected.  Jaw pain. EXAM: CT ANGIOGRAPHY HEAD AND NECK WITH AND WITHOUT CONTRAST  TECHNIQUE: Multidetector CT imaging of the head and neck was performed using the standard protocol during bolus administration of intravenous contrast. Multiplanar CT image reconstructions and MIPs were obtained to evaluate the vascular anatomy. Carotid stenosis measurements (when applicable) are obtained utilizing NASCET criteria, using the distal internal carotid diameter as the denominator. RADIATION DOSE REDUCTION: This exam was performed according to the departmental dose-optimization program which includes automated exposure control, adjustment of the mA and/or kV according to patient size and/or use of iterative reconstruction technique. CONTRAST:  OMNIPAQUE IOHEXOL 350 MG/ML SOLN COMPARISON:  None Available. FINDINGS: CTA NECK FINDINGS Aortic arch: Standard 3 vessel aortic arch with widely patent arch vessel origins. Right carotid system: Patent without evidence of stenosis or dissection. Retropharyngeal course of the distal common and proximal internal carotid arteries. Left carotid system: Patent without evidence of stenosis or dissection. Retropharyngeal course of the distal common carotid artery. Vertebral arteries: Streak artifact from cervical spine instrumentation obscures portions of both V2 segments. Both vertebral arteries are patent elsewhere without evidence of a significant stenosis or dissection. Skeleton: Solid C4-C6 ACDF and C5-C7 posterior fusion. Other neck: No evidence of cervical lymphadenopathy or mass. Upper chest: No mass or consolidation in the included lung apices. Review of the MIP images confirms the above findings CTA HEAD FINDINGS Anterior circulation: The internal carotid arteries are widely patent from skull base to carotid termini. ACAs and MCAs are patent without evidence of a proximal branch occlusion or significant proximal stenosis. Posterior circulation: Intracranial vertebral arteries are widely patent to the basilar. Patent bilateral PICA, left AICA, and bilateral  SCA origins are visualized. The basilar artery is widely patent. There are patent posterior communicating arteries bilaterally. Both PCAs are patent without evidence of a significant proximal stenosis. No aneurysm is identified. Venous sinuses: Poorly evaluated due to arterial contrast timing. Anatomic variants: None. Review of the MIP images confirms the above findings IMPRESSION: No large vessel occlusion, significant stenosis, or dissection in the head or neck within limitations of streak artifact through the V2 vertebral arteries. Electronically Signed   By: Sebastian Ache M.D.   On: 01/08/2023 19:45   CT Angio Chest/Abd/Pel for Dissection W and/or W/WO  Result Date: 01/08/2023 CLINICAL DATA:  Jaw pain, headache, chest pain EXAM: CT ANGIOGRAPHY CHEST, ABDOMEN AND PELVIS TECHNIQUE: Non-contrast CT of the chest was initially obtained. Multidetector CT imaging through the chest, abdomen and pelvis was performed using the standard protocol during bolus administration of intravenous contrast. Multiplanar reconstructed images and MIPs were obtained and reviewed to evaluate the vascular anatomy. RADIATION DOSE REDUCTION: This exam was performed according to the departmental dose-optimization program which includes automated exposure control, adjustment of the mA  and/or kV according to patient size and/or use of iterative reconstruction technique. CONTRAST:  OMNIPAQUE IOHEXOL 350 MG/ML SOLN COMPARISON:  01/08/2023, 04/04/2022, 04/30/2021 FINDINGS: CTA CHEST FINDINGS Cardiovascular: No evidence of thoracic aortic aneurysm or dissection. Mild cardiomegaly unchanged, with no pericardial effusion. There is technically adequate opacification of the pulmonary vasculature. No filling defects or pulmonary emboli. Mediastinum/Nodes: Stable appearance of the thyroid, trachea, and esophagus. No pathologic mediastinal or hilar adenopathy. Lungs/Pleura: No acute airspace disease, effusion, or pneumothorax. Dependent  hypoventilatory changes. Central airways are patent. Musculoskeletal: No acute or destructive bony abnormalities. Stable postsurgical changes at the cervicothoracic junction. Reconstructed images demonstrate no additional findings. Review of the MIP images confirms the above findings. CTA ABDOMEN AND PELVIS FINDINGS VASCULAR Aorta: Normal caliber aorta without aneurysm, dissection, vasculitis or significant stenosis. Mild atherosclerosis. Celiac: Patent without evidence of aneurysm, dissection, vasculitis or significant stenosis. SMA: Patent without evidence of aneurysm, dissection, vasculitis or significant stenosis. Renals: Both renal arteries are patent without evidence of aneurysm, dissection, vasculitis, fibromuscular dysplasia or significant stenosis. IMA: Patent without evidence of aneurysm, dissection, vasculitis or significant stenosis. Inflow: Patent without evidence of aneurysm, dissection, vasculitis or significant stenosis. Veins: No obvious venous abnormality within the limitations of this arterial phase study. Review of the MIP images confirms the above findings. NON-VASCULAR Hepatobiliary: No focal liver abnormality is seen. No gallstones, gallbladder wall thickening, or biliary dilatation. Pancreas: Unremarkable. No pancreatic ductal dilatation or surrounding inflammatory changes. Spleen: Normal in size without focal abnormality. Adrenals/Urinary Tract: Right greater than left renal cortical thinning, with areas of right renal cortical scarring. There is mild to moderate right-sided hydronephrosis and hydroureter, likely due to irregular bladder wall thickening extending to the right UVJ. No left-sided obstruction. The adrenals are unremarkable. There is an indwelling catheter decompressing the bladder. Progressive irregular bladder wall thickening is identified anteriorly, consistent with chronic cystitis given chronic indwelling Foley catheter. Since the prior MRI, wall thickening is slightly more  pronounced and there is mild perivesicular fat stranding. This could reflect superimposed acute cystitis. The wall thickening extends to the level of the bilateral UVJs, with evidence of right-sided obstructive uropathy as described above. Stomach/Bowel: No bowel obstruction or ileus. Normal appendix right lower quadrant. No bowel wall thickening or inflammatory change. Lymphatic: No pathologic adenopathy. Reproductive: Uterus and bilateral adnexa are unremarkable. Other: No free fluid or free intraperitoneal gas. No abdominal wall hernia. Musculoskeletal: No acute or destructive bony abnormalities. Reconstructed images demonstrate no additional findings. Review of the MIP images confirms the above findings. IMPRESSION: Vascular: 1. No evidence of thoracoabdominal aortic aneurysm or dissection. 2. No evidence of pulmonary embolus. 3.  Aortic Atherosclerosis (ICD10-I70.0). Nonvascular: 1. Likely acute on chronic cystitis, with irregular bladder wall thickening involving the ventral aspect of the bladder and extending posteriorly to the level of the ureterovesicular junctions. 2. Right-sided obstructive uropathy, with progressive right renal cortical scarring. This is likely due to obstruction at the level of the right UVJ due to chronic cystitis. Urology consultation recommended. 3. No acute intrathoracic process. Electronically Signed   By: Sharlet Salina M.D.   On: 01/08/2023 19:40   CT Head Wo Contrast  Result Date: 01/08/2023 CLINICAL DATA:  Sudden onset severe headache, jaw pain EXAM: CT HEAD WITHOUT CONTRAST TECHNIQUE: Contiguous axial images were obtained from the base of the skull through the vertex without intravenous contrast. RADIATION DOSE REDUCTION: This exam was performed according to the departmental dose-optimization program which includes automated exposure control, adjustment of the mA and/or kV according to patient  size and/or use of iterative reconstruction technique. COMPARISON:  04/12/2021  FINDINGS: Brain: No acute infarct or hemorrhage. Lateral ventricles and midline structures are unremarkable. No acute extra-axial fluid collections. No mass effect. Vascular: No hyperdense vessel or unexpected calcification. Skull: Normal. Negative for fracture or focal lesion. Sinuses/Orbits: No acute finding. Other: None. IMPRESSION: 1. Stable head CT, no acute intracranial process. Electronically Signed   By: Sharlet Salina M.D.   On: 01/08/2023 16:59   DG Chest 2 View  Result Date: 01/08/2023 CLINICAL DATA:  Chest pain EXAM: CHEST - 2 VIEW COMPARISON:  Previous studies including the examination of 09/12/2022 FINDINGS: Transverse diameter of heart is increased. There are no signs of pulmonary edema or focal pulmonary consolidation. There is linear density in the lateral aspect of right lower lung field, pleural thickening with no significant change. There is no pleural effusion or pneumothorax. Surgical fusion is seen in lower cervical spine. IMPRESSION: Cardiomegaly. There are no signs of pulmonary edema or focal pulmonary consolidation. Electronically Signed   By: Ernie Avena M.D.   On: 01/08/2023 14:27        Scheduled Meds:  aspirin EC  81 mg Oral Daily   atorvastatin  10 mg Oral Daily   Chlorhexidine Gluconate Cloth  6 each Topical Daily   dextromethorphan-guaiFENesin  1 tablet Oral Q12H   DULoxetine  30 mg Oral Daily   DULoxetine  60 mg Oral Daily   enoxaparin (LOVENOX) injection  0.5 mg/kg Subcutaneous Q24H   linaclotide  290 mcg Oral QAC breakfast   pantoprazole  40 mg Oral Daily   polyethylene glycol  17 g Oral Daily   polyvinyl alcohol  2 drop Both Eyes BID   prednisoLONE acetate  1 drop Left Eye Daily   pregabalin  150 mg Oral BID   senna-docusate  1 tablet Oral BID   traZODone  50 mg Oral QHS   Continuous Infusions:  cefTRIAXone (ROCEPHIN)  IV 2 g (01/10/23 0848)   lactated ringers 100 mL/hr at 01/09/23 2351     LOS: 2 days      Tresa Moore,  MD [facility]   If 7PM-7AM, please contact night-coverage  01/10/2023, 11:16 AM

## 2023-01-10 NOTE — Progress Notes (Signed)
*  PRELIMINARY RESULTS* Echocardiogram 2D Echocardiogram has been performed.  Carolyne Fiscal 01/10/2023, 4:01 PM

## 2023-01-11 DIAGNOSIS — I214 Non-ST elevation (NSTEMI) myocardial infarction: Secondary | ICD-10-CM | POA: Diagnosis not present

## 2023-01-11 LAB — ECHOCARDIOGRAM COMPLETE
AR max vel: 2.95 cm2
AV Area VTI: 2.9 cm2
AV Area mean vel: 2.53 cm2
AV Mean grad: 4 mmHg
AV Peak grad: 7.2 mmHg
Ao pk vel: 1.34 m/s
Area-P 1/2: 3.27 cm2
Height: 65 in
MV VTI: 2.48 cm2
S' Lateral: 2.6 cm
Weight: 4896 oz

## 2023-01-11 LAB — URINE CULTURE: Culture: >=100000 — AB

## 2023-01-11 LAB — CULTURE, BLOOD (ROUTINE X 2): Special Requests: ADEQUATE

## 2023-01-11 LAB — HEMOGLOBIN A1C
Hgb A1c MFr Bld: 7.3 % — ABNORMAL HIGH (ref 4.8–5.6)
Mean Plasma Glucose: 163 mg/dL

## 2023-01-11 MED ORDER — SODIUM CHLORIDE 0.9 % IV SOLN
12.5000 mg | Freq: Four times a day (QID) | INTRAVENOUS | Status: DC | PRN
  Administered 2023-01-12: 12.5 mg via INTRAVENOUS
  Filled 2023-01-11: qty 12.5
  Filled 2023-01-11: qty 0.5

## 2023-01-11 NOTE — Progress Notes (Signed)
PROGRESS NOTE    Wendy Nelson  WUJ:[id number] DOB: Aug 27, 1965 DOA: 01/08/2023 PCP: [facility]    Brief Narrative:   57 y.o. 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  She initially had chest pain but this resolved by admission.  She has some associated shortness of breath.  Denies nausea vomiting and diaphoresis. ED course and data review: BP 160/100 with otherwise normal vitals.Notable labs include troponin 104> 118.  WBC 11,000.      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  Troponin 104-114 and EKG with nonspecific ST-T wave changes.  These nonspecific changes have been present in EKG since 2018.  Suspect supply/demand ischemia in the setting of complicated urinary tract infection Plan: Continue aspirin and statin Sublingual nitro as needed chest pain 2D echocardiogram, completed, pending read No further invasive cardiac diagnostics   Complicated UTI (urinary tract infection) SIRS, questionable sepsis Following admission patient became hypotensive with systolic drifting down to the 95A, eventually fluid responsive to 101/65.  Other vitals remain unchanged Sepsis  workup was initiated.  Urinalysis from exchanged Foley showing large leukocytes esterase lactic acid was 1.3 and procalcitonin less than 0.1 Patient was hospitalized a year ago in April 2023 for UTI and sepsis presenting with hypotension Urine culture with E. coli and Enterococcus faecalis Plan: E. coli pansensitive.  Enterococcus faecalis sensitivities pending.  Continue IV ampicillin.  Monitor cultures.  Anticipate de-escalation to oral antibiotics and discharge 6/29.  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  Decubitus precautions   Neurogenic bladder Secondary to spinal cord infarct.   Foley exchanged in the ED   Depression with anxiety Continue duloxetine and trazodone   HTN (hypertension) Continue antihypertensives  Morbid obesity BMI 50.92.  This complicates overall care and prognosis   DVT prophylaxis: SQ Lovenox Code Status: Full Family Communication: None Disposition Plan: Status is: Inpatient Remains inpatient appropriate because: Complicated UTI   Level of care: Progressive  Consultants:  Cardiology  Procedures:  None  Antimicrobials: Ampicillin   Subjective: Seen and examined.  No acute events overnight  Objective: Vitals:   01/10/23 1923 01/10/23 2321 01/11/23 0306 01/11/23 0747  BP: 108/68 (!) 96/59 99/73 (!) 104/55  Pulse: 81 78 78 79  Resp: 18 16 18 18   Temp: 98.9 F (37.2 C) 98.1 F (36.7 C) 98.4 F (36.9 C) 98 F (36.7 C)  TempSrc: Oral Oral Oral Oral  SpO2: 92% 96% 91% 92%  Weight:      Height:        Intake/Output Summary (Last 24 hours) at 01/11/2023  1228 Last data filed at 01/11/2023 0641 Gross per 24 hour  Intake 1725.4 ml  Output 2000 ml  Net -274.6 ml    Filed Weights   01/08/23 1335  Weight: (!) 138.8 kg    Examination:  General exam: NAD Respiratory system: Diminished lung sounds.  Poor respiratory effort.  Normal work of breathing.  Room air Cardiovascular system: S1-S2, RRR, no murmurs, no pedal edema Gastrointestinal system: Obese, soft, NT/ND Central nervous system: Alert and oriented.  C7 quadriplegia  extremities: 0X5 power bilaterally lower extremities Skin: No rashes, lesions or ulcers Psychiatry: Judgement and insight appear normal. Mood & affect flattened.     Data Reviewed: I have personally reviewed following labs and imaging studies  CBC: Recent Labs  Lab 01/08/23 1809 01/09/23 0853 01/10/23 0357  WBC 11.3* 8.6 7.9  HGB 14.4 11.2* 11.4*  HCT 45.2 35.5* 36.8  MCV 87.3 87.0 88.7  PLT 233 249 251   Basic Metabolic Panel: Recent Labs  Lab 01/08/23 1809 01/09/23 0853  NA 138 143  K 3.8 3.5  CL 101 109  CO2 27 26  GLUCOSE 120* 121*  BUN 14 12  CREATININE 0.95 0.82  CALCIUM 8.8* 8.1*   GFR: Estimated Creatinine Clearance: 109.8 mL/min (by C-G formula based on SCr of 0.82 mg/dL). Liver Function Tests: No results for input(s): "AST", "ALT", "ALKPHOS", "BILITOT", "PROT", "ALBUMIN" in the last 168 hours. No results for input(s): "LIPASE", "AMYLASE" in the last 168 hours. No results for input(s): "AMMONIA" in the last 168 hours. Coagulation Profile: Recent Labs  Lab 01/08/23 2319  INR 1.3*   Cardiac Enzymes: No results for input(s): "CKTOTAL", "CKMB", "CKMBINDEX", "TROPONINI" in the last 168 hours. BNP (last 3 results) No results for input(s): "PROBNP" in the last 8760 hours. HbA1C: Recent Labs    01/10/23 0357  HGBA1C 7.3*   CBG: No results for input(s): "GLUCAP" in the last 168 hours. Lipid Profile: Recent Labs    01/09/23 0853  CHOL 120  HDL 33*  LDLCALC 68  TRIG 96  CHOLHDL 3.6   Thyroid Function Tests: No results for input(s): "TSH", "T4TOTAL", "FREET4", "T3FREE",  "THYROIDAB" in the last 72 hours. Anemia Panel: No results for input(s): "VITAMINB12", "FOLATE", "FERRITIN", "TIBC", "IRON", "RETICCTPCT" in the last 72 hours. Sepsis Labs: Recent Labs  Lab 01/08/23 1941 01/08/23 2319  PROCALCITON <0.10  --   LATICACIDVEN  --  1.3    Recent Results (from the past 240 hour(s))  Urine Culture     Status: Abnormal (Preliminary result)   Collection Time: 01/08/23  8:12 PM   Specimen: Urine, Catheterized  Result Value Ref Range Status   Specimen Description   Final    URINE, CATHETERIZED Performed at [facility], [address], [place], [place] [address]    Special Requests   Final    NONE Performed at [facility], [address] [place], [place] [address]    Culture (A)  Final    >=100,000 COLONIES/mL ESCHERICHIA COLI 80,000 COLONIES/mL ENTEROCOCCUS FAECALIS REPEATING SUSCEPTIBILITY Performed at [facility], [address] [place], [place] [address]    Report Status PENDING  Incomplete   Organism ID, Bacteria ESCHERICHIA COLI (A)  Final      Susceptibility   Escherichia coli - MIC*    AMPICILLIN 4 SENSITIVE Sensitive     CEFAZOLIN <=4 SENSITIVE Sensitive     CEFEPIME <=0.12 SENSITIVE Sensitive     CEFTRIAXONE <=0.25 SENSITIVE Sensitive     CIPROFLOXACIN <=0.25 SENSITIVE Sensitive  GENTAMICIN <=1 SENSITIVE Sensitive     IMIPENEM <=0.25 SENSITIVE Sensitive     NITROFURANTOIN <=16 SENSITIVE Sensitive     TRIMETH/SULFA <=20 SENSITIVE Sensitive     AMPICILLIN/SULBACTAM <=2 SENSITIVE Sensitive     PIP/TAZO <=4 SENSITIVE Sensitive     * >=100,000 COLONIES/mL ESCHERICHIA COLI  Culture, blood (x 2)     Status: None (Preliminary result)   Collection Time: 01/08/23 11:19 PM   Specimen: BLOOD  Result Value Ref Range Status   Specimen Description BLOOD RIGHT ANTECUBITAL  Final   Special Requests   Final    BOTTLES DRAWN AEROBIC AND ANAEROBIC Blood Culture adequate volume   Culture   Final    NO GROWTH 3  DAYS Performed at [facility], [address] [place], [place] [address]    Report Status PENDING  Incomplete  Culture, blood (x 2)     Status: None (Preliminary result)   Collection Time: 01/09/23  5:46 PM   Specimen: BLOOD  Result Value Ref Range Status   Specimen Description BLOOD BLOOD LEFT HAND  Final   Special Requests   Final    BOTTLES DRAWN AEROBIC AND ANAEROBIC Blood Culture adequate volume   Culture   Final    NO GROWTH 2 DAYS Performed at [facility], [address], [place], [place] [address]    Report Status PENDING  Incomplete         Radiology Studies: No results found.      Scheduled Meds:  aspirin EC  81 mg Oral Daily   atorvastatin  10 mg Oral Daily   Chlorhexidine Gluconate Cloth  6 each Topical Daily   dextromethorphan-guaiFENesin  1 tablet Oral Q12H   DULoxetine  30 mg Oral Daily   DULoxetine  60 mg Oral Daily   enoxaparin (LOVENOX) injection  0.5 mg/kg Subcutaneous Q24H   lidocaine  1 patch Transdermal Q24H   linaclotide  290 mcg Oral QAC breakfast   pantoprazole  40 mg Oral Daily   polyethylene glycol  17 g Oral Daily   polyvinyl alcohol  2 drop Both Eyes BID   prednisoLONE acetate  1 drop Left Eye Daily   pregabalin  150 mg Oral BID   senna-docusate  1 tablet Oral BID   traZODone  50 mg Oral QHS   Continuous Infusions:  ampicillin (OMNIPEN) IV Stopped (01/11/23 4782)   lactated ringers Stopped (01/10/23 2357)     LOS: 3 days      Tresa Moore, MD [facility]   If 7PM-7AM, please contact night-coverage  01/11/2023, 12:28 PM

## 2023-01-11 NOTE — TOC Progression Note (Addendum)
Transition of Care [facility]) - Progression Note    Patient Details  Name: Wendy Nelson MRN: [id number] Date of Birth: [birth year]  Transition of Care [facility]) CM/SW Contact  Darleene Cleaver, [place] Phone Number: 01/11/2023, 2:22 PM  Clinical Narrative:     CSW was informed that patient can return back to [facility] tomorrow 01/12/2023 where she is a LTC resident.  Per [facility], admission worker will be available after 9:30am.  Per Almira Coaster in admissions TOC to call her at [phone] in the morning if patient is medically ready for discharge.  Expected Discharge Plan: Skilled Nursing Facility Barriers to Discharge: Continued Medical Work up  Expected Discharge Plan and Services     Post Acute Care Choice: Resumption of Svcs/PTA Provider Living arrangements for the past 2 months: Skilled Nursing Facility                                       Social Determinants of Health (SDOH) Interventions SDOH Screenings   Food Insecurity: No Food Insecurity (01/09/2023)  Housing: Low Risk  (01/09/2023)  Transportation Needs: No Transportation Needs (01/09/2023)  Utilities: Not At Risk (01/09/2023)  Tobacco Use: Low Risk  (01/08/2023)    Readmission Risk Interventions     No data to display

## 2023-01-11 NOTE — Plan of Care (Signed)
  Problem: Education: Goal: Understanding of cardiac disease, CV risk reduction, and recovery process will improve Outcome: Progressing Goal: Individualized Educational Video(s) Outcome: Progressing   Problem: Activity: Goal: Ability to tolerate increased activity will improve Outcome: Progressing   Problem: Cardiac: Goal: Ability to achieve and maintain adequate cardiovascular perfusion will improve Outcome: Progressing   Problem: Health Behavior/Discharge Planning: Goal: Ability to safely manage health-related needs after discharge will improve Outcome: Progressing   Problem: Fluid Volume: Goal: Hemodynamic stability will improve Outcome: Progressing   Problem: Clinical Measurements: Goal: Diagnostic test results will improve Outcome: Progressing Goal: Signs and symptoms of infection will decrease Outcome: Progressing   Problem: Respiratory: Goal: Ability to maintain adequate ventilation will improve Outcome: Progressing   Problem: Education: Goal: Knowledge of General Education information will improve Description: Including pain rating scale, medication(s)/side effects and non-pharmacologic comfort measures Outcome: Progressing   Problem: Health Behavior/Discharge Planning: Goal: Ability to manage health-related needs will improve Outcome: Progressing   Problem: Clinical Measurements: Goal: Ability to maintain clinical measurements within normal limits will improve Outcome: Progressing Goal: Will remain free from infection Outcome: Progressing Goal: Diagnostic test results will improve Outcome: Progressing Goal: Respiratory complications will improve Outcome: Progressing Goal: Cardiovascular complication will be avoided Outcome: Progressing   Problem: Activity: Goal: Risk for activity intolerance will decrease Outcome: Progressing   Problem: Nutrition: Goal: Adequate nutrition will be maintained Outcome: Progressing   Problem: Coping: Goal: Level of  anxiety will decrease Outcome: Progressing   Problem: Elimination: Goal: Will not experience complications related to bowel motility Outcome: Progressing Goal: Will not experience complications related to urinary retention Outcome: Progressing   Problem: Pain Managment: Goal: General experience of comfort will improve Outcome: Progressing   Problem: Safety: Goal: Ability to remain free from injury will improve Outcome: Progressing   Problem: Skin Integrity: Goal: Risk for impaired skin integrity will decrease Outcome: Progressing   

## 2023-01-12 DIAGNOSIS — I214 Non-ST elevation (NSTEMI) myocardial infarction: Secondary | ICD-10-CM | POA: Diagnosis not present

## 2023-01-12 LAB — URINE CULTURE

## 2023-01-12 LAB — CULTURE, BLOOD (ROUTINE X 2)

## 2023-01-12 MED ORDER — AMOXICILLIN 500 MG PO CAPS
500.0000 mg | ORAL_CAPSULE | Freq: Three times a day (TID) | ORAL | Status: DC
  Filled 2023-01-12: qty 1

## 2023-01-12 MED ORDER — AMOXICILLIN 500 MG PO CAPS: 500.0000 mg | ORAL_CAPSULE | Freq: Three times a day (TID) | ORAL | 0 refills | Status: AC

## 2023-01-12 MED ORDER — OXYCODONE HCL 5 MG PO TABS: 5.0000 mg | ORAL_TABLET | ORAL | 0 refills | Status: AC | PRN

## 2023-01-12 NOTE — TOC Transition Note (Signed)
Transition of Care [facility]) - CM/SW Discharge Note   Patient Details  Name: Wendy Nelson MRN: [id number] Date of Birth: [birth year]  [facility]) CM/SW Contact:  Carmina Miller, LCSWA Phone Number: 01/12/2023, 10:22 AM   Clinical Narrative:     Patient will DC to: [facility] Anticipated DC date: 01/12/23 Family notified: attempted to call all family in chart, no answer.  Transport by: Wendie Simmer   Per MD patient ready for DC to [facility]. RN to call report prior to discharge [phone], room 110 A, nursing station 1. RN, patient, patient's family, and facility notified of DC. Discharge Summary and FL2 sent to facility. DC packet on chart. Ambulance transport requested for patient.   CSW will sign off for now as social work intervention is no longer needed. Please consult [place] again if new needs arise.      Barriers to Discharge: Continued Medical Work up   Patient Goals and CMS Choice      Discharge Placement                         Discharge Plan and Services Additional resources added to the After Visit Summary for       Post Acute Care Choice: Resumption of Svcs/PTA Provider                               Social Determinants of Health (SDOH) Interventions SDOH Screenings   Food Insecurity: No Food Insecurity (01/09/2023)  Housing: Low Risk  (01/09/2023)  Transportation Needs: No Transportation Needs (01/09/2023)  Utilities: Not At Risk (01/09/2023)  Tobacco Use: Low Risk  (01/08/2023)     Readmission Risk Interventions     No data to display

## 2023-01-12 NOTE — Progress Notes (Deleted)
Wendy Nelson  A and O x 4. VSS. Pt tolerating diet well. No complaints of pain or nausea. IV removed intact, prescriptions given. Pt voiced understanding of discharge instructions with no further questions. Pt discharged via wheelchair.    Allergies as of 01/12/2023       Reactions   Melatonin    Misc. Sulfonamide Containing Compounds    Neomycin Other (See Comments)   Positive patch test   Sulfa Antibiotics Itching        Medication List     STOP taking these medications    ipratropium-albuterol 0.5-2.5 (3) MG/3ML Soln Commonly known as: DUONEB   lactulose 10 GM/15ML solution Commonly known as: CHRONULAC   loratadine 10 MG tablet Commonly known as: CLARITIN   methocarbamol 500 MG tablet Commonly known as: ROBAXIN   nystatin powder Commonly known as: MYCOSTATIN/NYSTOP       TAKE these medications    Acetaminophen 500 MG capsule Take 1,000 mg by mouth every 6 (six) hours as needed.   amoxicillin 500 MG capsule Commonly known as: AMOXIL Take 1 capsule (500 mg total) by mouth every 8 (eight) hours for 5 days.   aspirin 81 MG chewable tablet Chew 81 mg by mouth daily.   atorvastatin 10 MG tablet Commonly known as: LIPITOR Take 10 mg by mouth daily.   benzonatate 100 MG capsule Commonly known as: TESSALON Take 100 mg by mouth daily as needed for cough.   bisacodyl 10 MG suppository Commonly known as: DULCOLAX Place 1 suppository (10 mg total) rectally daily as needed for moderate constipation.   CRANBERRY PO Take by mouth daily.   D-Mannose 500 MG Caps Take by mouth daily. AZO   DermaPhor Oint Apply 1 application. topically daily. To feet.   dextromethorphan-guaiFENesin 30-600 MG 12hr tablet Commonly known as: MUCINEX DM Take 1 tablet by mouth every 12 (twelve) hours.   DULoxetine 60 MG capsule Commonly known as: CYMBALTA Take 60 mg by mouth daily.   DULoxetine 30 MG capsule Commonly known as: CYMBALTA Take 30 mg by mouth daily.   Eliquis 5  MG Tabs tablet Generic drug: apixaban Take 5 mg by mouth every other day.   Ergocalciferol 50 MCG (2000 UT) Tabs Take 2 tablets by mouth daily.   fluticasone 50 MCG/ACT nasal spray Commonly known as: FLONASE Place 1 spray into both nostrils daily.   lidocaine 4 % cream Commonly known as: LMX Apply 1 Application topically daily. Apply to left shoulder.   Linzess 290 MCG Caps capsule Generic drug: linaclotide Take 290 mcg by mouth daily before breakfast.   meclizine 25 MG tablet Commonly known as: ANTIVERT Take 25 mg by mouth daily as needed.   multivitamin tablet Take 1 tablet by mouth daily.   naloxone 4 MG/0.1ML Liqd nasal spray kit Commonly known as: NARCAN Place 1 spray into the nose once.   omeprazole 40 MG capsule Commonly known as: PRILOSEC Take 40 mg by mouth daily.   ondansetron 4 MG tablet Commonly known as: ZOFRAN Take 4 mg by mouth every 6 (six) hours as needed for nausea.   Oral Relief For Dry Mouth Lozg by Transmucosal route.   oxyCODONE 5 MG immediate release tablet Commonly known as: Oxy IR/ROXICODONE Take 1 tablet (5 mg total) by mouth every 4 (four) hours as needed for up to 3 days for moderate pain. SNF use only.  Refills per SNF MD What changed: additional instructions   polyethylene glycol powder 17 GM/SCOOP powder Commonly known as: GLYCOLAX/MIRALAX Take 1  Container by mouth daily.   prednisoLONE acetate 1 % ophthalmic suspension Commonly known as: PRED FORTE Place 1 drop into the left eye daily.   pregabalin 150 MG capsule Commonly known as: LYRICA Take 150 mg by mouth 2 (two) times daily.   senna-docusate 8.6-50 MG tablet Commonly known as: Senokot-S Take 1 tablet by mouth 2 (two) times daily.   traZODone 50 MG tablet Commonly known as: DESYREL Take 50 mg by mouth at bedtime.   Visine Dry Eye Relief 1 % Soln Generic drug: Polyethylene Glycol 400 Place 2 drops into both eyes 2 (two) times daily.        Vitals:   01/12/23  0731 01/12/23 1200  BP: 116/80 104/66  Pulse: 100 89  Resp: 16 14  Temp: 100 F (37.8 C) 98.3 F (36.8 C)  SpO2: 90% 91%    Wendy Nelson

## 2023-01-12 NOTE — Progress Notes (Signed)
Wendy Nelson  A and O x 4. VSS. Pt tolerating diet well. No complaints of pain or nausea. IV removed intact, prescriptions given. Pt voiced understanding of discharge instructions with no further questions. Pt discharged via wheelchair with EMS    Allergies as of 01/12/2023       Reactions   Melatonin    Misc. Sulfonamide Containing Compounds    Neomycin Other (See Comments)   Positive patch test   Sulfa Antibiotics Itching        Medication List     STOP taking these medications    ipratropium-albuterol 0.5-2.5 (3) MG/3ML Soln Commonly known as: DUONEB   lactulose 10 GM/15ML solution Commonly known as: CHRONULAC   loratadine 10 MG tablet Commonly known as: CLARITIN   methocarbamol 500 MG tablet Commonly known as: ROBAXIN   nystatin powder Commonly known as: MYCOSTATIN/NYSTOP       TAKE these medications    Acetaminophen 500 MG capsule Take 1,000 mg by mouth every 6 (six) hours as needed.   amoxicillin 500 MG capsule Commonly known as: AMOXIL Take 1 capsule (500 mg total) by mouth every 8 (eight) hours for 5 days.   aspirin 81 MG chewable tablet Chew 81 mg by mouth daily.   atorvastatin 10 MG tablet Commonly known as: LIPITOR Take 10 mg by mouth daily.   benzonatate 100 MG capsule Commonly known as: TESSALON Take 100 mg by mouth daily as needed for cough.   bisacodyl 10 MG suppository Commonly known as: DULCOLAX Place 1 suppository (10 mg total) rectally daily as needed for moderate constipation.   CRANBERRY PO Take by mouth daily.   D-Mannose 500 MG Caps Take by mouth daily. AZO   DermaPhor Oint Apply 1 application. topically daily. To feet.   dextromethorphan-guaiFENesin 30-600 MG 12hr tablet Commonly known as: MUCINEX DM Take 1 tablet by mouth every 12 (twelve) hours.   DULoxetine 60 MG capsule Commonly known as: CYMBALTA Take 60 mg by mouth daily.   DULoxetine 30 MG capsule Commonly known as: CYMBALTA Take 30 mg by mouth daily.    Eliquis 5 MG Tabs tablet Generic drug: apixaban Take 5 mg by mouth every other day.   Ergocalciferol 50 MCG (2000 UT) Tabs Take 2 tablets by mouth daily.   fluticasone 50 MCG/ACT nasal spray Commonly known as: FLONASE Place 1 spray into both nostrils daily.   lidocaine 4 % cream Commonly known as: LMX Apply 1 Application topically daily. Apply to left shoulder.   Linzess 290 MCG Caps capsule Generic drug: linaclotide Take 290 mcg by mouth daily before breakfast.   meclizine 25 MG tablet Commonly known as: ANTIVERT Take 25 mg by mouth daily as needed.   multivitamin tablet Take 1 tablet by mouth daily.   naloxone 4 MG/0.1ML Liqd nasal spray kit Commonly known as: NARCAN Place 1 spray into the nose once.   omeprazole 40 MG capsule Commonly known as: PRILOSEC Take 40 mg by mouth daily.   ondansetron 4 MG tablet Commonly known as: ZOFRAN Take 4 mg by mouth every 6 (six) hours as needed for nausea.   Oral Relief For Dry Mouth Lozg by Transmucosal route.   oxyCODONE 5 MG immediate release tablet Commonly known as: Oxy IR/ROXICODONE Take 1 tablet (5 mg total) by mouth every 4 (four) hours as needed for up to 3 days for moderate pain. SNF use only.  Refills per SNF MD What changed: additional instructions   polyethylene glycol powder 17 GM/SCOOP powder Commonly known as: GLYCOLAX/MIRALAX  Take 1 Container by mouth daily.   prednisoLONE acetate 1 % ophthalmic suspension Commonly known as: PRED FORTE Place 1 drop into the left eye daily.   pregabalin 150 MG capsule Commonly known as: LYRICA Take 150 mg by mouth 2 (two) times daily.   senna-docusate 8.6-50 MG tablet Commonly known as: Senokot-S Take 1 tablet by mouth 2 (two) times daily.   traZODone 50 MG tablet Commonly known as: DESYREL Take 50 mg by mouth at bedtime.   Visine Dry Eye Relief 1 % Soln Generic drug: Polyethylene Glycol 400 Place 2 drops into both eyes 2 (two) times daily.        Vitals:    01/12/23 0731 01/12/23 1200  BP: 116/80 104/66  Pulse: 100 89  Resp: 16 14  Temp: 100 F (37.8 C) 98.3 F (36.8 C)  SpO2: 90% 91%    Wendy Nelson

## 2023-01-12 NOTE — Discharge Summary (Signed)
Physician Discharge Summary  Wendy Nelson ZOX:[id number] DOB: Feb 24, 1966 DOA: 01/08/2023  PCP: [facility]  Admit date: 01/08/2023 Discharge date: 01/12/2023  Admitted From: SNF Disposition:  SNF ([facility])  Recommendations for Outpatient Follow-up:  Follow up with PCP in 1-2 weeks   Home Health:No Equipment/Devices:Oxygen via [place]   Discharge Condition:Stable CODE STATUS:FULL  Diet recommendation: Heart  Brief/Interim Summary:  57 y.o. 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  She initially had chest pain but this resolved by admission.  She has some associated shortness of breath.  Denies nausea vomiting and diaphoresis. ED course and data review: BP 160/100 with otherwise normal vitals.Notable labs include troponin 104> 118.  WBC 11,000.      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  Troponin 104-114 and EKG with nonspecific ST-T wave changes.  These nonspecific changes have been present in EKG since 2018.  Suspect supply/demand ischemia in the setting of complicated urinary tract infection Plan: Continue aspirin and statin Sublingual nitro as needed chest pain 2D  echocardiogram, completed, reassuring  No further invasive cardiac diagnostics   Complicated UTI (urinary tract infection) SIRS, questionable sepsis Following admission patient became hypotensive with systolic drifting down to the 81X, eventually fluid responsive to 101/65.  Other vitals remain unchanged Sepsis workup was initiated.  Urinalysis from exchanged Foley showing large leukocytes esterase lactic acid was 1.3 and procalcitonin less than 0.1 Patient was hospitalized a year ago in April 2023 for UTI and sepsis presenting with hypotension Urine culture with E. coli and Enterococcus faecalis Plan: E. coli pansensitive.  Enterococcus faecalis sensitivities pansensitive.  De-escalate to amoxicillin 500 TID.  Additional 5 days prescribed.  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  Decubitus precautions   Neurogenic bladder Secondary to spinal cord infarct.   Foley exchanged in the ED   Depression with anxiety Continue duloxetine and trazodone   HTN (hypertension) Continue antihypertensives   Morbid obesity BMI 50.92.  This complicates overall care and prognosis  Discharge Instructions  Discharge Instructions     Diet - low sodium heart healthy   Complete by: As directed    Increase activity slowly   Complete by: As directed       Allergies as of 01/12/2023       Reactions   Melatonin    Misc. Sulfonamide Containing Compounds    Neomycin Other (See Comments)   Positive patch test   Sulfa Antibiotics Itching        Medication List     STOP taking these medications  ipratropium-albuterol 0.5-2.5 (3) MG/3ML Soln Commonly known as: DUONEB   lactulose 10 GM/15ML  solution Commonly known as: CHRONULAC   loratadine 10 MG tablet Commonly known as: CLARITIN   methocarbamol 500 MG tablet Commonly known as: ROBAXIN   nystatin powder Commonly known as: MYCOSTATIN/NYSTOP       TAKE these medications    Acetaminophen 500 MG capsule Take 1,000 mg by mouth every 6 (six) hours as needed.   amoxicillin 500 MG capsule Commonly known as: AMOXIL Take 1 capsule (500 mg total) by mouth every 8 (eight) hours for 5 days.   aspirin 81 MG chewable tablet Chew 81 mg by mouth daily.   atorvastatin 10 MG tablet Commonly known as: LIPITOR Take 10 mg by mouth daily.   benzonatate 100 MG capsule Commonly known as: TESSALON Take 100 mg by mouth daily as needed for cough.   bisacodyl 10 MG suppository Commonly known as: DULCOLAX Place 1 suppository (10 mg total) rectally daily as needed for moderate constipation.   CRANBERRY PO Take by mouth daily.   D-Mannose 500 MG Caps Take by mouth daily. AZO   DermaPhor Oint Apply 1 application. topically daily. To feet.   dextromethorphan-guaiFENesin 30-600 MG 12hr tablet Commonly known as: MUCINEX DM Take 1 tablet by mouth every 12 (twelve) hours.   DULoxetine 60 MG capsule Commonly known as: CYMBALTA Take 60 mg by mouth daily.   DULoxetine 30 MG capsule Commonly known as: CYMBALTA Take 30 mg by mouth daily.   Eliquis 5 MG Tabs tablet Generic drug: apixaban Take 5 mg by mouth every other day.   Ergocalciferol 50 MCG (2000 UT) Tabs Take 2 tablets by mouth daily.   fluticasone 50 MCG/ACT nasal spray Commonly known as: FLONASE Place 1 spray into both nostrils daily.   lidocaine 4 % cream Commonly known as: LMX Apply 1 Application topically daily. Apply to left shoulder.   Linzess 290 MCG Caps capsule Generic drug: linaclotide Take 290 mcg by mouth daily before breakfast.   meclizine 25 MG tablet Commonly known as: ANTIVERT Take 25 mg by mouth daily as needed.   multivitamin  tablet Take 1 tablet by mouth daily.   naloxone 4 MG/0.1ML Liqd nasal spray kit Commonly known as: NARCAN Place 1 spray into the nose once.   omeprazole 40 MG capsule Commonly known as: PRILOSEC Take 40 mg by mouth daily.   ondansetron 4 MG tablet Commonly known as: ZOFRAN Take 4 mg by mouth every 6 (six) hours as needed for nausea.   Oral Relief For Dry Mouth Lozg by Transmucosal route.   oxyCODONE 5 MG immediate release tablet Commonly known as: Oxy IR/ROXICODONE Take 1 tablet (5 mg total) by mouth every 4 (four) hours as needed for up to 3 days for moderate pain. SNF use only.  Refills per SNF MD What changed: additional instructions   polyethylene glycol powder 17 GM/SCOOP powder Commonly known as: GLYCOLAX/MIRALAX Take 1 Container by mouth daily.   prednisoLONE acetate 1 % ophthalmic suspension Commonly known as: PRED FORTE Place 1 drop into the left eye daily.   pregabalin 150 MG capsule Commonly known as: LYRICA Take 150 mg by mouth 2 (two) times daily.   senna-docusate 8.6-50 MG tablet Commonly known as: Senokot-S Take 1 tablet by mouth 2 (two) times daily.   traZODone 50 MG tablet Commonly known as: DESYREL Take 50 mg by mouth at bedtime.   Visine Dry Eye Relief 1 % Soln Generic drug: Polyethylene Glycol 400 Place 2 drops into  both eyes 2 (two) times daily.        Contact information for after-discharge care     Destination     [facility] Preferred SNF .   Service: Skilled Nursing Contact information: [address] [place] [address] [phone]                    Allergies  Allergen Reactions   Melatonin    Misc. Sulfonamide Containing Compounds    Neomycin Other (See Comments)    Positive patch test   Sulfa Antibiotics Itching    Consultations: Cardiology   Procedures/Studies: ECHOCARDIOGRAM COMPLETE  Result Date: 01/11/2023    ECHOCARDIOGRAM REPORT   Patient Name:   Wendy Nelson  Date of Exam: 01/10/2023 Medical Rec #:  [id number]   Height:       65.0 in Accession #:    [id number]  Weight:       306.0 lb Date of Birth:  September 01, 1965    BSA:          2.370 m Patient Age:    57 years    BP:           108/67 mmHg Patient Gender: F           HR:           82 bpm. Exam Location:  [facility] Procedure: 2D Echo, Cardiac Doppler and Color Doppler Indications:     Elevated Troponin  History:         Patient has no prior history of Echocardiogram examinations.                  Acute MI, Signs/Symptoms:Syncope; Risk Factors:Hypertension and                  Dyslipidemia.  Sonographer:     Mikki Harbor Referring Phys:  [id number] LILY MICHELLE TANG Diagnosing Phys: Marcina Millard MD  Sonographer Comments: Technically difficult study due to poor echo windows, suboptimal apical window, suboptimal subcostal window and patient is obese. IMPRESSIONS  1. Left ventricular ejection fraction, by estimation, is 65 to 70%. The left ventricle has normal function. The left ventricle has no regional wall motion abnormalities. Left ventricular diastolic parameters were normal.  2. Right ventricular systolic function is normal. The right ventricular size is normal.  3. The mitral valve is normal in structure. Trivial mitral valve regurgitation. No evidence of mitral stenosis.  4. The aortic valve is normal in structure. Aortic valve regurgitation is not visualized. No aortic stenosis is present.  5. The inferior vena cava is normal in size with greater than 50% respiratory variability, suggesting right atrial pressure of 3 mmHg. FINDINGS  Left Ventricle: Left ventricular ejection fraction, by estimation, is 65 to 70%. The left ventricle has normal function. The left ventricle has no regional wall motion abnormalities. The left ventricular internal cavity size was normal in size. There is  no left ventricular hypertrophy. Left ventricular diastolic parameters were normal. Right Ventricle: The right ventricular size is  normal. No increase in right ventricular wall thickness. Right ventricular systolic function is normal. Left Atrium: Left atrial size was normal in size. Right Atrium: Right atrial size was normal in size. Pericardium: There is no evidence of pericardial effusion. Mitral Valve: The mitral valve is normal in structure. Trivial mitral valve regurgitation. No evidence of mitral valve stenosis. MV peak gradient, 4.4 mmHg. The mean mitral valve gradient is 2.0 mmHg. Tricuspid Valve: The tricuspid valve is  normal in structure. Tricuspid valve regurgitation is trivial. No evidence of tricuspid stenosis. Aortic Valve: The aortic valve is normal in structure. Aortic valve regurgitation is not visualized. No aortic stenosis is present. Aortic valve mean gradient measures 4.0 mmHg. Aortic valve peak gradient measures 7.2 mmHg. Aortic valve area, by VTI measures 2.90 cm. Pulmonic Valve: The pulmonic valve was normal in structure. Pulmonic valve regurgitation is not visualized. No evidence of pulmonic stenosis. Aorta: The aortic root is normal in size and structure. Venous: The inferior vena cava is normal in size with greater than 50% respiratory variability, suggesting right atrial pressure of 3 mmHg. IAS/Shunts: No atrial level shunt detected by color flow Doppler.  LEFT VENTRICLE PLAX 2D LVIDd:         4.00 cm   Diastology LVIDs:         2.60 cm   LV e' medial:   10.00 cm/s LV PW:         1.20 cm   LV E/e' medial: 10.2 LV IVS:        1.20 cm LVOT diam:     2.00 cm LV SV:         68 LV SV Index:   29 LVOT Area:     3.14 cm  RIGHT VENTRICLE RV Basal diam:  3.05 cm RV Mid diam:    3.00 cm RV S prime:     20.90 cm/s TAPSE (M-mode): 2.1 cm LEFT ATRIUM             Index        RIGHT ATRIUM           Index LA diam:        3.30 cm 1.39 cm/m   RA Area:     13.40 cm LA Vol (A2C):   48.6 ml 20.51 ml/m  RA Volume:   27.10 ml  11.43 ml/m LA Vol (A4C):   48.2 ml 20.34 ml/m LA Biplane Vol: 48.6 ml 20.51 ml/m  AORTIC VALVE                     PULMONIC VALVE AV Area (Vmax):    2.95 cm     PV Vmax:       0.83 m/s AV Area (Vmean):   2.53 cm     PV Peak grad:  2.8 mmHg AV Area (VTI):     2.90 cm AV Vmax:           134.00 cm/s AV Vmean:          93.500 cm/s AV VTI:            0.234 m AV Peak Grad:      7.2 mmHg AV Mean Grad:      4.0 mmHg LVOT Vmax:         126.00 cm/s LVOT Vmean:        75.300 cm/s LVOT VTI:          0.216 m LVOT/AV VTI ratio: 0.92  AORTA Ao Root diam: 2.90 cm MITRAL VALVE MV Area (PHT): 3.27 cm     SHUNTS MV Area VTI:   2.48 cm     Systemic VTI:  0.22 m MV Peak grad:  4.4 mmHg     Systemic Diam: 2.00 cm MV Mean grad:  2.0 mmHg MV Vmax:       1.05 m/s MV Vmean:      73.6 cm/s MV Decel Time: 232 msec MV E velocity: 102.00 cm/s  MV A velocity: 110.00 cm/s MV E/A ratio:  0.93 Marcina Millard MD Electronically signed by Marcina Millard MD Signature Date/Time: 01/11/2023/1:08:54 PM    Final    CT ANGIO HEAD NECK W WO CM  Result Date: 01/08/2023 CLINICAL DATA:  Carotid artery dissection suspected.  Jaw pain. EXAM: CT ANGIOGRAPHY HEAD AND NECK WITH AND WITHOUT CONTRAST TECHNIQUE: Multidetector CT imaging of the head and neck was performed using the standard protocol during bolus administration of intravenous contrast. Multiplanar CT image reconstructions and MIPs were obtained to evaluate the vascular anatomy. Carotid stenosis measurements (when applicable) are obtained utilizing NASCET criteria, using the distal internal carotid diameter as the denominator. RADIATION DOSE REDUCTION: This exam was performed according to the departmental dose-optimization program which includes automated exposure control, adjustment of the mA and/or kV according to patient size and/or use of iterative reconstruction technique. CONTRAST:  OMNIPAQUE IOHEXOL 350 MG/ML SOLN COMPARISON:  None Available. FINDINGS: CTA NECK FINDINGS Aortic arch: Standard 3 vessel aortic arch with widely patent arch vessel origins. Right carotid system: Patent  without evidence of stenosis or dissection. Retropharyngeal course of the distal common and proximal internal carotid arteries. Left carotid system: Patent without evidence of stenosis or dissection. Retropharyngeal course of the distal common carotid artery. Vertebral arteries: Streak artifact from cervical spine instrumentation obscures portions of both V2 segments. Both vertebral arteries are patent elsewhere without evidence of a significant stenosis or dissection. Skeleton: Solid C4-C6 ACDF and C5-C7 posterior fusion. Other neck: No evidence of cervical lymphadenopathy or mass. Upper chest: No mass or consolidation in the included lung apices. Review of the MIP images confirms the above findings CTA HEAD FINDINGS Anterior circulation: The internal carotid arteries are widely patent from skull base to carotid termini. ACAs and MCAs are patent without evidence of a proximal branch occlusion or significant proximal stenosis. Posterior circulation: Intracranial vertebral arteries are widely patent to the basilar. Patent bilateral PICA, left AICA, and bilateral SCA origins are visualized. The basilar artery is widely patent. There are patent posterior communicating arteries bilaterally. Both PCAs are patent without evidence of a significant proximal stenosis. No aneurysm is identified. Venous sinuses: Poorly evaluated due to arterial contrast timing. Anatomic variants: None. Review of the MIP images confirms the above findings IMPRESSION: No large vessel occlusion, significant stenosis, or dissection in the head or neck within limitations of streak artifact through the V2 vertebral arteries. Electronically Signed   By: Sebastian Ache M.D.   On: 01/08/2023 19:45   CT Angio Chest/Abd/Pel for Dissection W and/or W/WO  Result Date: 01/08/2023 CLINICAL DATA:  Jaw pain, headache, chest pain EXAM: CT ANGIOGRAPHY CHEST, ABDOMEN AND PELVIS TECHNIQUE: Non-contrast CT of the chest was initially obtained. Multidetector CT  imaging through the chest, abdomen and pelvis was performed using the standard protocol during bolus administration of intravenous contrast. Multiplanar reconstructed images and MIPs were obtained and reviewed to evaluate the vascular anatomy. RADIATION DOSE REDUCTION: This exam was performed according to the departmental dose-optimization program which includes automated exposure control, adjustment of the mA and/or kV according to patient size and/or use of iterative reconstruction technique. CONTRAST:  OMNIPAQUE IOHEXOL 350 MG/ML SOLN COMPARISON:  01/08/2023, 04/04/2022, 04/30/2021 FINDINGS: CTA CHEST FINDINGS Cardiovascular: No evidence of thoracic aortic aneurysm or dissection. Mild cardiomegaly unchanged, with no pericardial effusion. There is technically adequate opacification of the pulmonary vasculature. No filling defects or pulmonary emboli. Mediastinum/Nodes: Stable appearance of the thyroid, trachea, and esophagus. No pathologic mediastinal or hilar adenopathy. Lungs/Pleura: No acute airspace  disease, effusion, or pneumothorax. Dependent hypoventilatory changes. Central airways are patent. Musculoskeletal: No acute or destructive bony abnormalities. Stable postsurgical changes at the cervicothoracic junction. Reconstructed images demonstrate no additional findings. Review of the MIP images confirms the above findings. CTA ABDOMEN AND PELVIS FINDINGS VASCULAR Aorta: Normal caliber aorta without aneurysm, dissection, vasculitis or significant stenosis. Mild atherosclerosis. Celiac: Patent without evidence of aneurysm, dissection, vasculitis or significant stenosis. SMA: Patent without evidence of aneurysm, dissection, vasculitis or significant stenosis. Renals: Both renal arteries are patent without evidence of aneurysm, dissection, vasculitis, fibromuscular dysplasia or significant stenosis. IMA: Patent without evidence of aneurysm, dissection, vasculitis or significant stenosis. Inflow: Patent  without evidence of aneurysm, dissection, vasculitis or significant stenosis. Veins: No obvious venous abnormality within the limitations of this arterial phase study. Review of the MIP images confirms the above findings. NON-VASCULAR Hepatobiliary: No focal liver abnormality is seen. No gallstones, gallbladder wall thickening, or biliary dilatation. Pancreas: Unremarkable. No pancreatic ductal dilatation or surrounding inflammatory changes. Spleen: Normal in size without focal abnormality. Adrenals/Urinary Tract: Right greater than left renal cortical thinning, with areas of right renal cortical scarring. There is mild to moderate right-sided hydronephrosis and hydroureter, likely due to irregular bladder wall thickening extending to the right UVJ. No left-sided obstruction. The adrenals are unremarkable. There is an indwelling catheter decompressing the bladder. Progressive irregular bladder wall thickening is identified anteriorly, consistent with chronic cystitis given chronic indwelling Foley catheter. Since the prior MRI, wall thickening is slightly more pronounced and there is mild perivesicular fat stranding. This could reflect superimposed acute cystitis. The wall thickening extends to the level of the bilateral UVJs, with evidence of right-sided obstructive uropathy as described above. Stomach/Bowel: No bowel obstruction or ileus. Normal appendix right lower quadrant. No bowel wall thickening or inflammatory change. Lymphatic: No pathologic adenopathy. Reproductive: Uterus and bilateral adnexa are unremarkable. Other: No free fluid or free intraperitoneal gas. No abdominal wall hernia. Musculoskeletal: No acute or destructive bony abnormalities. Reconstructed images demonstrate no additional findings. Review of the MIP images confirms the above findings. IMPRESSION: Vascular: 1. No evidence of thoracoabdominal aortic aneurysm or dissection. 2. No evidence of pulmonary embolus. 3.  Aortic Atherosclerosis  (ICD10-I70.0). Nonvascular: 1. Likely acute on chronic cystitis, with irregular bladder wall thickening involving the ventral aspect of the bladder and extending posteriorly to the level of the ureterovesicular junctions. 2. Right-sided obstructive uropathy, with progressive right renal cortical scarring. This is likely due to obstruction at the level of the right UVJ due to chronic cystitis. Urology consultation recommended. 3. No acute intrathoracic process. Electronically Signed   By: Sharlet Salina M.D.   On: 01/08/2023 19:40   CT Head Wo Contrast  Result Date: 01/08/2023 CLINICAL DATA:  Sudden onset severe headache, jaw pain EXAM: CT HEAD WITHOUT CONTRAST TECHNIQUE: Contiguous axial images were obtained from the base of the skull through the vertex without intravenous contrast. RADIATION DOSE REDUCTION: This exam was performed according to the departmental dose-optimization program which includes automated exposure control, adjustment of the mA and/or kV according to patient size and/or use of iterative reconstruction technique. COMPARISON:  04/12/2021 FINDINGS: Brain: No acute infarct or hemorrhage. Lateral ventricles and midline structures are unremarkable. No acute extra-axial fluid collections. No mass effect. Vascular: No hyperdense vessel or unexpected calcification. Skull: Normal. Negative for fracture or focal lesion. Sinuses/Orbits: No acute finding. Other: None. IMPRESSION: 1. Stable head CT, no acute intracranial process. Electronically Signed   By: Sharlet Salina M.D.   On: 01/08/2023 16:59   DG  Chest 2 View  Result Date: 01/08/2023 CLINICAL DATA:  Chest pain EXAM: CHEST - 2 VIEW COMPARISON:  Previous studies including the examination of 09/12/2022 FINDINGS: Transverse diameter of heart is increased. There are no signs of pulmonary edema or focal pulmonary consolidation. There is linear density in the lateral aspect of right lower lung field, pleural thickening with no significant change.  There is no pleural effusion or pneumothorax. Surgical fusion is seen in lower cervical spine. IMPRESSION: Cardiomegaly. There are no signs of pulmonary edema or focal pulmonary consolidation. Electronically Signed   By: Ernie Avena M.D.   On: 01/08/2023 14:27   MR Pelvis W Wo Contrast  Result Date: 12/30/2022 CLINICAL DATA:  Urinary dysfunction. Permanent bladder catheter. Urethral diverticulum suspected on pelvic exam. EXAM: MRI PELVIS WITHOUT AND WITH CONTRAST TECHNIQUE: Multiplanar multisequence MR imaging of the pelvis was performed both before and after administration of intravenous contrast. CONTRAST:  10mL GADAVIST GADOBUTROL 1 MMOL/ML IV SOLN COMPARISON:  None Available. FINDINGS: Lower Urinary Tract: Foley catheter is seen within the bladder. Mild diffuse bladder wall thickening and mucosal enhancement is consistent with cystitis. No focal bladder mass visualized. No evidence of urethral diverticulum. Bowel: Unremarkable pelvic bowel loops. Vascular/Lymphatic: Unremarkable. No pathologically enlarged pelvic lymph nodes identified. Reproductive: -- Uterus: Measures 7.9 x 3.2 by 5.7 cm (volume = 75 cm^3). No fibroids or other masses identified. Cervix and vagina are unremarkable. -- Right ovary: Appears normal. No ovarian or adnexal masses identified. -- Left ovary: Appears normal. No ovarian or adnexal masses identified. Other: No peritoneal thickening or abnormal free fluid. Musculoskeletal:  Unremarkable. IMPRESSION: Mild diffuse bladder wall thickening and mucosal enhancement, consistent with cystitis. No evidence of urethral diverticulum. Normal appearance of uterus and both ovaries. No evidence of pelvic mass. Electronically Signed   By: Danae Orleans M.D.   On: 12/30/2022 11:55      Subjective: Seen and examined.  Appropriate for dc to SNF  Discharge Exam: Vitals:   01/12/23 0431 01/12/23 0731  BP: 126/87 116/80  Pulse: 88 100  Resp: 18 16  Temp: 98.3 F (36.8 C) 100 F (37.8  C)  SpO2: 90% 90%   Vitals:   01/11/23 2050 01/11/23 2337 01/12/23 0431 01/12/23 0731  BP: 107/70 128/84 126/87 116/80  Pulse:   88 100  Resp: 19  18 16   Temp: 98 F (36.7 C) 98.5 F (36.9 C) 98.3 F (36.8 C) 100 F (37.8 C)  TempSrc: Oral Oral Oral   SpO2: 90% 91% 90% 90%  Weight:      Height:           The results of significant diagnostics from this hospitalization (including imaging, microbiology, ancillary and laboratory) are listed below for reference.     Microbiology: Recent Results (from the past 240 hour(s))  Urine Culture     Status: Abnormal   Collection Time: 01/08/23  8:12 PM   Specimen: Urine, Catheterized  Result Value Ref Range Status   Specimen Description   Final    URINE, CATHETERIZED Performed at [facility], [address], [place], [place] [address]    Special Requests   Final    NONE Performed at [facility], [address] [place], [place] [address]    Culture (A)  Final    >=100,000 COLONIES/mL ESCHERICHIA COLI 80,000 COLONIES/mL ENTEROCOCCUS FAECALIS    Report Status 01/12/2023 FINAL  Final   Organism ID, Bacteria ESCHERICHIA COLI (A)  Final   Organism ID, Bacteria ENTEROCOCCUS FAECALIS (A)  Final      Susceptibility   Escherichia coli - MIC*    AMPICILLIN 4 SENSITIVE Sensitive     CEFAZOLIN <=4 SENSITIVE Sensitive     CEFEPIME <=0.12 SENSITIVE Sensitive     CEFTRIAXONE <=0.25 SENSITIVE Sensitive     CIPROFLOXACIN <=0.25 SENSITIVE Sensitive     GENTAMICIN <=1 SENSITIVE Sensitive     IMIPENEM <=0.25 SENSITIVE Sensitive     NITROFURANTOIN <=16 SENSITIVE Sensitive     TRIMETH/SULFA <=20 SENSITIVE Sensitive     AMPICILLIN/SULBACTAM <=2 SENSITIVE Sensitive     PIP/TAZO <=4 SENSITIVE Sensitive     * >=100,000 COLONIES/mL ESCHERICHIA COLI   Enterococcus faecalis - MIC*    AMPICILLIN <=2 SENSITIVE Sensitive     NITROFURANTOIN <=16 SENSITIVE Sensitive     VANCOMYCIN 1 SENSITIVE Sensitive     * 80,000 COLONIES/mL  ENTEROCOCCUS FAECALIS  Culture, blood (x 2)     Status: None (Preliminary result)   Collection Time: 01/08/23 11:19 PM   Specimen: BLOOD  Result Value Ref Range Status   Specimen Description BLOOD RIGHT ANTECUBITAL  Final   Special Requests   Final    BOTTLES DRAWN AEROBIC AND ANAEROBIC Blood Culture adequate volume   Culture   Final    NO GROWTH 4 DAYS Performed at [facility], [address] [place], [place] [address]    Report Status PENDING  Incomplete  Culture, blood (x 2)     Status: None (Preliminary result)   Collection Time: 01/09/23  5:46 PM   Specimen: BLOOD  Result Value Ref Range Status   Specimen Description BLOOD BLOOD LEFT HAND  Final   Special Requests   Final    BOTTLES DRAWN AEROBIC AND ANAEROBIC Blood Culture adequate volume   Culture   Final    NO GROWTH 3 DAYS Performed at [facility], [address] [place], [place] [address]    Report Status PENDING  Incomplete     Labs: BNP (last 3 results) No results for input(s): "BNP" in the last 8760 hours. Basic Metabolic Panel: Recent Labs  Lab 01/08/23 1809 01/09/23 0853  NA 138 143  K 3.8 3.5  CL 101 109  CO2 27 26  GLUCOSE 120* 121*  BUN 14 12  CREATININE 0.95 0.82  CALCIUM 8.8* 8.1*   Liver Function Tests: No results for input(s): "AST", "ALT", "ALKPHOS", "BILITOT", "PROT", "ALBUMIN" in the last 168 hours. No results for input(s): "LIPASE", "AMYLASE" in the last 168 hours. No results for input(s): "AMMONIA" in the last 168 hours. CBC: Recent Labs  Lab 01/08/23 1809 01/09/23 0853 01/10/23 0357  WBC 11.3* 8.6 7.9  HGB 14.4 11.2* 11.4*  HCT 45.2 35.5* 36.8  MCV 87.3 87.0 88.7  PLT 233 249 251   Cardiac Enzymes: No results for input(s): "CKTOTAL", "CKMB", "CKMBINDEX", "TROPONINI" in the last 168 hours. BNP: Invalid input(s): "POCBNP" CBG: No results for input(s): "GLUCAP" in the last 168 hours. D-Dimer No results for input(s): "DDIMER" in the last 72 hours. Hgb  A1c Recent Labs    01/10/23 0357  HGBA1C 7.3*   Lipid Profile No results for input(s): "CHOL", "HDL", "LDLCALC", "TRIG", "CHOLHDL", "LDLDIRECT" in the last 72 hours. Thyroid function studies No results for input(s): "TSH", "T4TOTAL", "T3FREE", "THYROIDAB" in the last 72 hours.  Invalid input(s): "FREET3" Anemia work up No results for input(s): "VITAMINB12", "FOLATE", "FERRITIN", "TIBC", "IRON", "RETICCTPCT" in the last 72 hours. Urinalysis    Component Value Date/Time   COLORURINE YELLOW (A) 01/08/2023 2059  APPEARANCEUR TURBID (A) 01/08/2023 2059   APPEARANCEUR Cloudy (A) 12/19/2022 1304   LABSPEC >1.046 (H) 01/08/2023 2059   PHURINE 6.0 01/08/2023 2059   GLUCOSEU NEGATIVE 01/08/2023 2059   HGBUR MODERATE (A) 01/08/2023 2059   BILIRUBINUR NEGATIVE 01/08/2023 2059   BILIRUBINUR Negative 12/19/2022 1304   KETONESUR NEGATIVE 01/08/2023 2059   PROTEINUR 100 (A) 01/08/2023 2059   NITRITE NEGATIVE 01/08/2023 2059   LEUKOCYTESUR LARGE (A) 01/08/2023 2059   Sepsis Labs Recent Labs  Lab 01/08/23 1809 01/09/23 0853 01/10/23 0357  WBC 11.3* 8.6 7.9   Microbiology Recent Results (from the past 240 hour(s))  Urine Culture     Status: Abnormal   Collection Time: 01/08/23  8:12 PM   Specimen: Urine, Catheterized  Result Value Ref Range Status   Specimen Description   Final    URINE, CATHETERIZED Performed at [facility], [address] [place], [place] [address]    Special Requests   Final    NONE Performed at [facility], [address] [place], [place] [address]    Culture (A)  Final    >=100,000 COLONIES/mL ESCHERICHIA COLI 80,000 COLONIES/mL ENTEROCOCCUS FAECALIS    Report Status 01/12/2023 FINAL  Final   Organism ID, Bacteria ESCHERICHIA COLI (A)  Final   Organism ID, Bacteria ENTEROCOCCUS FAECALIS (A)  Final      Susceptibility   Escherichia coli - MIC*    AMPICILLIN 4 SENSITIVE Sensitive     CEFAZOLIN <=4 SENSITIVE Sensitive      CEFEPIME <=0.12 SENSITIVE Sensitive     CEFTRIAXONE <=0.25 SENSITIVE Sensitive     CIPROFLOXACIN <=0.25 SENSITIVE Sensitive     GENTAMICIN <=1 SENSITIVE Sensitive     IMIPENEM <=0.25 SENSITIVE Sensitive     NITROFURANTOIN <=16 SENSITIVE Sensitive     TRIMETH/SULFA <=20 SENSITIVE Sensitive     AMPICILLIN/SULBACTAM <=2 SENSITIVE Sensitive     PIP/TAZO <=4 SENSITIVE Sensitive     * >=100,000 COLONIES/mL ESCHERICHIA COLI   Enterococcus faecalis - MIC*    AMPICILLIN <=2 SENSITIVE Sensitive     NITROFURANTOIN <=16 SENSITIVE Sensitive     VANCOMYCIN 1 SENSITIVE Sensitive     * 80,000 COLONIES/mL ENTEROCOCCUS FAECALIS  Culture, blood (x 2)     Status: None (Preliminary result)   Collection Time: 01/08/23 11:19 PM   Specimen: BLOOD  Result Value Ref Range Status   Specimen Description BLOOD RIGHT ANTECUBITAL  Final   Special Requests   Final    BOTTLES DRAWN AEROBIC AND ANAEROBIC Blood Culture adequate volume   Culture   Final    NO GROWTH 4 DAYS Performed at [facility], [address] [place], [place] [address]    Report Status PENDING  Incomplete  Culture, blood (x 2)     Status: None (Preliminary result)   Collection Time: 01/09/23  5:46 PM   Specimen: BLOOD  Result Value Ref Range Status   Specimen Description BLOOD BLOOD LEFT HAND  Final   Special Requests   Final    BOTTLES DRAWN AEROBIC AND ANAEROBIC Blood Culture adequate volume   Culture   Final    NO GROWTH 3 DAYS Performed at [facility], [address] [place], [place] [address]    Report Status PENDING  Incomplete     Time coordinating discharge: Over 30 minutes  SIGNED:   Tresa Moore, MD  [facility] 01/12/2023, 10:11 AM Pager   If 7PM-7AM, please contact night-coverage @

## 2023-01-13 LAB — CULTURE, BLOOD (ROUTINE X 2)
Culture: NO GROWTH
Culture: NO GROWTH

## 2023-02-18 ENCOUNTER — Emergency Department: Payer: 59

## 2023-02-18 ENCOUNTER — Inpatient Hospital Stay
Admission: EM | Admit: 2023-02-18 | Discharge: 2023-02-21 | DRG: 698 | Disposition: A | Discharge status: Skilled Nursing Facility | Payer: 59 | Attending: Internal Medicine | Admitting: Internal Medicine

## 2023-02-18 ENCOUNTER — Other Ambulatory Visit: Payer: Self-pay

## 2023-02-18 DIAGNOSIS — N179 Acute kidney failure, unspecified: Secondary | ICD-10-CM | POA: Diagnosis present

## 2023-02-18 DIAGNOSIS — Z1152 Encounter for screening for COVID-19: Secondary | ICD-10-CM

## 2023-02-18 DIAGNOSIS — Z7401 Bed confinement status: Secondary | ICD-10-CM

## 2023-02-18 DIAGNOSIS — I2510 Atherosclerotic heart disease of native coronary artery without angina pectoris: Secondary | ICD-10-CM | POA: Insufficient documentation

## 2023-02-18 DIAGNOSIS — Z79899 Other long term (current) drug therapy: Secondary | ICD-10-CM

## 2023-02-18 DIAGNOSIS — E785 Hyperlipidemia, unspecified: Secondary | ICD-10-CM | POA: Diagnosis present

## 2023-02-18 DIAGNOSIS — Z7984 Long term (current) use of oral hypoglycemic drugs: Secondary | ICD-10-CM

## 2023-02-18 DIAGNOSIS — Z833 Family history of diabetes mellitus: Secondary | ICD-10-CM | POA: Diagnosis not present

## 2023-02-18 DIAGNOSIS — R6520 Severe sepsis without septic shock: Secondary | ICD-10-CM | POA: Diagnosis present

## 2023-02-18 DIAGNOSIS — G9341 Metabolic encephalopathy: Secondary | ICD-10-CM

## 2023-02-18 DIAGNOSIS — T83511A Infection and inflammatory reaction due to indwelling urethral catheter, initial encounter: Secondary | ICD-10-CM | POA: Diagnosis not present

## 2023-02-18 DIAGNOSIS — N10 Acute tubulo-interstitial nephritis: Secondary | ICD-10-CM | POA: Diagnosis present

## 2023-02-18 DIAGNOSIS — Z978 Presence of other specified devices: Secondary | ICD-10-CM

## 2023-02-18 DIAGNOSIS — I2489 Other forms of acute ischemic heart disease: Secondary | ICD-10-CM | POA: Diagnosis present

## 2023-02-18 DIAGNOSIS — Z888 Allergy status to other drugs, medicaments and biological substances status: Secondary | ICD-10-CM

## 2023-02-18 DIAGNOSIS — E1169 Type 2 diabetes mellitus with other specified complication: Secondary | ICD-10-CM

## 2023-02-18 DIAGNOSIS — Z8249 Family history of ischemic heart disease and other diseases of the circulatory system: Secondary | ICD-10-CM | POA: Diagnosis not present

## 2023-02-18 DIAGNOSIS — Z8673 Personal history of transient ischemic attack (TIA), and cerebral infarction without residual deficits: Secondary | ICD-10-CM

## 2023-02-18 DIAGNOSIS — I10 Essential (primary) hypertension: Secondary | ICD-10-CM | POA: Diagnosis present

## 2023-02-18 DIAGNOSIS — Y738 Miscellaneous gastroenterology and urology devices associated with adverse incidents, not elsewhere classified: Secondary | ICD-10-CM | POA: Diagnosis present

## 2023-02-18 DIAGNOSIS — R0902 Hypoxemia: Principal | ICD-10-CM

## 2023-02-18 DIAGNOSIS — N319 Neuromuscular dysfunction of bladder, unspecified: Secondary | ICD-10-CM | POA: Diagnosis present

## 2023-02-18 DIAGNOSIS — Z7982 Long term (current) use of aspirin: Secondary | ICD-10-CM

## 2023-02-18 DIAGNOSIS — Z96651 Presence of right artificial knee joint: Secondary | ICD-10-CM | POA: Diagnosis present

## 2023-02-18 DIAGNOSIS — A419 Sepsis, unspecified organism: Secondary | ICD-10-CM | POA: Diagnosis present

## 2023-02-18 DIAGNOSIS — G8254 Quadriplegia, C5-C7 incomplete: Secondary | ICD-10-CM | POA: Diagnosis present

## 2023-02-18 DIAGNOSIS — Z7901 Long term (current) use of anticoagulants: Secondary | ICD-10-CM

## 2023-02-18 DIAGNOSIS — G8250 Quadriplegia, unspecified: Secondary | ICD-10-CM | POA: Diagnosis not present

## 2023-02-18 DIAGNOSIS — E119 Type 2 diabetes mellitus without complications: Secondary | ICD-10-CM | POA: Diagnosis present

## 2023-02-18 DIAGNOSIS — Z882 Allergy status to sulfonamides status: Secondary | ICD-10-CM | POA: Diagnosis not present

## 2023-02-18 DIAGNOSIS — J9601 Acute respiratory failure with hypoxia: Secondary | ICD-10-CM | POA: Insufficient documentation

## 2023-02-18 DIAGNOSIS — Z6841 Body Mass Index (BMI) 40.0 and over, adult: Secondary | ICD-10-CM | POA: Diagnosis not present

## 2023-02-18 LAB — CULTURE, BLOOD (ROUTINE X 2)
Culture: NO GROWTH
Culture: NO GROWTH
Special Requests: ADEQUATE

## 2023-02-18 LAB — CBC WITH DIFFERENTIAL/PLATELET
Abs Immature Granulocytes: 0.03 10*3/uL (ref 0.00–0.07)
Basophils Absolute: 0.1 10*3/uL (ref 0.0–0.1)
Basophils Relative: 1 %
Eosinophils Absolute: 0.1 10*3/uL (ref 0.0–0.5)
Eosinophils Relative: 2 %
HCT: 35.8 % — ABNORMAL LOW (ref 36.0–46.0)
Hemoglobin: 11.6 g/dL — ABNORMAL LOW (ref 12.0–15.0)
Immature Granulocytes: 0 %
Lymphocytes Relative: 37 %
Lymphs Abs: 3.4 10*3/uL (ref 0.7–4.0)
MCH: 27.5 pg (ref 26.0–34.0)
MCHC: 32.4 g/dL (ref 30.0–36.0)
MCV: 84.8 fL (ref 80.0–100.0)
Monocytes Absolute: 0.9 10*3/uL (ref 0.1–1.0)
Monocytes Relative: 9 %
Neutro Abs: 4.8 10*3/uL (ref 1.7–7.7)
Neutrophils Relative %: 51 %
Platelets: 291 10*3/uL (ref 150–400)
RBC: 4.22 MIL/uL (ref 3.87–5.11)
RDW: 14.9 % (ref 11.5–15.5)
WBC: 9.3 10*3/uL (ref 4.0–10.5)
nRBC: 0 % (ref 0.0–0.2)

## 2023-02-18 LAB — COMPREHENSIVE METABOLIC PANEL
ALT: 27 U/L (ref 0–44)
AST: 24 U/L (ref 15–41)
Albumin: 2.9 g/dL — ABNORMAL LOW (ref 3.5–5.0)
Alkaline Phosphatase: 99 U/L (ref 38–126)
Anion gap: 10 (ref 5–15)
BUN: 17 mg/dL (ref 6–20)
CO2: 28 mmol/L (ref 22–32)
Calcium: 8 mg/dL — ABNORMAL LOW (ref 8.9–10.3)
Chloride: 99 mmol/L (ref 98–111)
Creatinine, Ser: 1.38 mg/dL — ABNORMAL HIGH (ref 0.44–1.00)
GFR, Estimated: 45 mL/min — ABNORMAL LOW (ref >60)
Glucose, Bld: 89 mg/dL (ref 70–99)
Potassium: 4.2 mmol/L (ref 3.5–5.1)
Sodium: 137 mmol/L (ref 135–145)
Total Bilirubin: 0.5 mg/dL (ref 0.3–1.2)
Total Protein: 6.9 g/dL (ref 6.5–8.1)

## 2023-02-18 LAB — MRSA NEXT GEN BY PCR, NASAL: MRSA by PCR Next Gen: NOT DETECTED

## 2023-02-18 LAB — URINALYSIS, W/ REFLEX TO CULTURE (INFECTION SUSPECTED)
Bacteria, UA: NONE SEEN
Bilirubin Urine: NEGATIVE
Glucose, UA: NEGATIVE mg/dL
Ketones, ur: NEGATIVE mg/dL
Nitrite: NEGATIVE
Protein, ur: 100 mg/dL — AB
RBC / HPF: >50 RBC/hpf (ref 0–5)
Specific Gravity, Urine: 1.015 (ref 1.005–1.030)
Squamous Epithelial / HPF: NONE SEEN /HPF (ref 0–5)
WBC, UA: >50 WBC/hpf (ref 0–5)
pH: 6 (ref 5.0–8.0)

## 2023-02-18 LAB — PROTIME-INR
INR: 1.3 — ABNORMAL HIGH (ref 0.8–1.2)
Prothrombin Time: 16.4 seconds — ABNORMAL HIGH (ref 11.4–15.2)

## 2023-02-18 LAB — APTT: aPTT: 34 seconds (ref 24–36)

## 2023-02-18 LAB — TROPONIN I (HIGH SENSITIVITY)
Troponin I (High Sensitivity): 95 ng/L — ABNORMAL HIGH (ref <18)
Troponin I (High Sensitivity): 102 ng/L (ref <18)

## 2023-02-18 LAB — LACTIC ACID, PLASMA
Lactic Acid, Venous: 1.1 mmol/L (ref 0.5–1.9)
Lactic Acid, Venous: 0.9 mmol/L (ref 0.5–1.9)

## 2023-02-18 LAB — PROCALCITONIN: Procalcitonin: <0.1 ng/mL

## 2023-02-18 LAB — SARS CORONAVIRUS 2 BY RT PCR: SARS Coronavirus 2 by RT PCR: NEGATIVE

## 2023-02-18 MED ORDER — LACTATED RINGERS IV SOLN: INTRAVENOUS | Status: DC

## 2023-02-18 MED ORDER — IOHEXOL 350 MG/ML SOLN
100.0000 mL | Freq: Once | INTRAVENOUS | Status: AC | PRN
  Administered 2023-02-18: 100 mL via INTRAVENOUS

## 2023-02-18 MED ORDER — ATORVASTATIN CALCIUM 10 MG PO TABS
10.0000 mg | ORAL_TABLET | Freq: Every day | ORAL | Status: DC
  Administered 2023-02-18 – 2023-02-21 (×4): 10 mg via ORAL
  Filled 2023-02-18 (×4): qty 1

## 2023-02-18 MED ORDER — TRAZODONE HCL 50 MG PO TABS
50.0000 mg | ORAL_TABLET | Freq: Every day | ORAL | Status: DC
  Administered 2023-02-18 – 2023-02-20 (×3): 50 mg via ORAL
  Filled 2023-02-18 (×3): qty 1

## 2023-02-18 MED ORDER — LINACLOTIDE 290 MCG PO CAPS
290.0000 ug | ORAL_CAPSULE | Freq: Every day | ORAL | Status: DC
  Filled 2023-02-18 (×2): qty 1

## 2023-02-18 MED ORDER — ONDANSETRON HCL 4 MG PO TABS: 4.0000 mg | ORAL_TABLET | Freq: Four times a day (QID) | ORAL | Status: DC | PRN

## 2023-02-18 MED ORDER — BISACODYL 10 MG RE SUPP: 10.0000 mg | Freq: Every day | RECTAL | Status: DC | PRN

## 2023-02-18 MED ORDER — ASPIRIN 81 MG PO CHEW
81.0000 mg | CHEWABLE_TABLET | Freq: Every day | ORAL | Status: DC
  Administered 2023-02-18 – 2023-02-21 (×4): 81 mg via ORAL
  Filled 2023-02-18 (×4): qty 1

## 2023-02-18 MED ORDER — ACETAMINOPHEN 325 MG PO TABS: 650.0000 mg | ORAL_TABLET | Freq: Four times a day (QID) | ORAL | Status: DC | PRN

## 2023-02-18 MED ORDER — ONDANSETRON HCL 4 MG/2ML IJ SOLN
4.0000 mg | Freq: Four times a day (QID) | INTRAMUSCULAR | Status: DC | PRN
  Administered 2023-02-18 – 2023-02-21 (×5): 4 mg via INTRAVENOUS
  Filled 2023-02-18 (×4): qty 2

## 2023-02-18 MED ORDER — POLYETHYLENE GLYCOL 3350 17 G PO PACK
17.0000 g | PACK | Freq: Every day | ORAL | Status: DC
  Administered 2023-02-18 – 2023-02-21 (×4): 17 g via ORAL
  Filled 2023-02-18 (×4): qty 1

## 2023-02-18 MED ORDER — VANCOMYCIN VARIABLE DOSE PER UNSTABLE RENAL FUNCTION (PHARMACIST DOSING): Status: DC

## 2023-02-18 MED ORDER — APIXABAN 5 MG PO TABS
5.0000 mg | ORAL_TABLET | ORAL | Status: DC
  Administered 2023-02-19 – 2023-02-21 (×2): 5 mg via ORAL
  Filled 2023-02-18 (×2): qty 1

## 2023-02-18 MED ORDER — ACETAMINOPHEN 650 MG RE SUPP: 650.0000 mg | Freq: Four times a day (QID) | RECTAL | Status: DC | PRN

## 2023-02-18 MED ORDER — SODIUM CHLORIDE 0.9 % IV SOLN
2.0000 g | Freq: Once | INTRAVENOUS | Status: AC
  Administered 2023-02-18: 2 g via INTRAVENOUS
  Filled 2023-02-18: qty 12.5

## 2023-02-18 MED ORDER — PANTOPRAZOLE SODIUM 40 MG PO TBEC
40.0000 mg | DELAYED_RELEASE_TABLET | Freq: Every day | ORAL | Status: DC
  Administered 2023-02-18 – 2023-02-21 (×4): 40 mg via ORAL
  Filled 2023-02-18 (×5): qty 1

## 2023-02-18 MED ORDER — VANCOMYCIN HCL 500 MG/100ML IV SOLN
500.0000 mg | Freq: Once | INTRAVENOUS | Status: AC
  Administered 2023-02-18: 500 mg via INTRAVENOUS
  Filled 2023-02-18: qty 100

## 2023-02-18 MED ORDER — SENNOSIDES-DOCUSATE SODIUM 8.6-50 MG PO TABS
1.0000 | ORAL_TABLET | Freq: Two times a day (BID) | ORAL | Status: DC
  Administered 2023-02-18 – 2023-02-21 (×7): 1 via ORAL
  Filled 2023-02-18 (×7): qty 1

## 2023-02-18 MED ORDER — ONDANSETRON HCL 4 MG/2ML IJ SOLN
INTRAMUSCULAR | Status: AC
  Filled 2023-02-18: qty 2

## 2023-02-18 MED ORDER — VANCOMYCIN HCL 2000 MG/400ML IV SOLN
2000.0000 mg | Freq: Once | INTRAVENOUS | Status: AC
  Administered 2023-02-18: 2000 mg via INTRAVENOUS
  Filled 2023-02-18: qty 400

## 2023-02-18 MED ORDER — SODIUM CHLORIDE 0.9 % IV SOLN
2.0000 g | INTRAVENOUS | Status: DC
  Administered 2023-02-18 – 2023-02-19 (×2): 2 g via INTRAVENOUS
  Filled 2023-02-18 (×3): qty 20

## 2023-02-18 MED ORDER — ACETAMINOPHEN 500 MG PO TABS
1000.0000 mg | ORAL_TABLET | Freq: Once | ORAL | Status: AC
  Administered 2023-02-18: 1000 mg via ORAL
  Filled 2023-02-18: qty 2

## 2023-02-18 MED ORDER — LACTATED RINGERS IV BOLUS
1000.0000 mL | Freq: Once | INTRAVENOUS | Status: AC
  Administered 2023-02-18: 1000 mL via INTRAVENOUS

## 2023-02-18 MED ORDER — VANCOMYCIN HCL 10 G IV SOLR: 3000.0000 mg | Freq: Once | INTRAVENOUS | Status: DC

## 2023-02-18 MED ORDER — DULOXETINE HCL 30 MG PO CPEP
90.0000 mg | ORAL_CAPSULE | Freq: Every day | ORAL | Status: DC
  Administered 2023-02-18 – 2023-02-19 (×2): 90 mg via ORAL
  Filled 2023-02-18: qty 1
  Filled 2023-02-18: qty 3

## 2023-02-18 NOTE — ED Triage Notes (Signed)
Pt arrives via [facility] from [facility] with CC of hypotension, hypoxia, fever, and weakness - meets sepsis criteria. Upon EMS arrival pt 80% on RA - increased to 98% on 4L [place].

## 2023-02-18 NOTE — Assessment & Plan Note (Addendum)
Patient meeting severe sepsis criteria with noted Tmax of 103, heart rate 100s, systolic blood pressure in the 90s White count stable at 9.3 Urinalysis indicative of infection with CT imaging concerning for right-sided pyelonephritis Noted history of complicated UTI in the past in the setting of chronic indwelling Foley associated with quadriplegia Prior culture shows pansensitive E. coli Started on IV cefepime, Flagyl vancomycin in ER Will transition to IV Rocephin and vancomycin for urinary coverage pending repeat cultures  Foley exchanged in the ER Pancultured Lactate stable at 1 IV fluid hydration Monitor

## 2023-02-18 NOTE — ED Notes (Signed)
Pt repositioned in bed.

## 2023-02-18 NOTE — Assessment & Plan Note (Signed)
New O2 requirement at 2 L in the setting of sepsis with pyelonephritis No overt pulmonary infection noted at present Suspect secondary to sepsis Baseline body habitus is likely confounding issue Continue supplemental oxygen for now Otherwise continue to monitor closely

## 2023-02-18 NOTE — Assessment & Plan Note (Signed)
?   Statin.

## 2023-02-18 NOTE — ED Notes (Signed)
Dr Arnoldo Morale notified of trop 102. Orders to be placed as needed

## 2023-02-18 NOTE — ED Notes (Signed)
Given ice water as requested.

## 2023-02-18 NOTE — H&P (Signed)
History and Physical    Patient: Wendy Nelson WUJ:[id number] DOB: 03/25/1966 DOA: 02/18/2023 DOS: the patient was seen and examined on 02/18/2023 PCP: [place]  Patient coming from: Home  Chief Complaint:  Chief Complaint  Patient presents with   Code Sepsis   HPI: Wendy Nelson is a 57 y.o. female with medical history significant of morbid obesity, quadriplegia, history of CVA, history of CAD, type 2 diabetes, hypertension presenting with sepsis, pyelonephritis.  Patient noted to be living at a local facility.  Patient noted to be hypotensive, hypoxic with fever and weakness at facility.  Patient reports generalized malaise.  No reported chest pain, shortness of breath.  No reported nausea or vomiting.  Patient overall fatigued.  Patient admitted June 2024 with similar issues with notable complicated UTI with culture showing E. coli and Enterococcus both pansensitive.  Was placed on course of amoxicillin for treatment at discharge. Presented to ER Tmax of 103.9, heart rate 100s, blood pressure 90s to 100s over 50s to 60s.  Satting in the mid 80s on room air.  Transition to 2 L nasal cannula to keep O2 sats greater than 92%. Review of Systems: As mentioned in the history of present illness. All other systems reviewed and are negative. Past Medical History:  Diagnosis Date   Quadriplegia [facility])    Stroke (HCC) 09/2020   Past Surgical History:  Procedure Laterality Date   CERVICAL SPINE SURGERY     JOINT REPLACEMENT Right    Knee   Social History:  reports that she has never smoked. She has never used smokeless tobacco. No history on file for alcohol use and drug use.  Allergies  Allergen Reactions   Melatonin    Misc. Sulfonamide Containing Compounds    Neomycin Other (See Comments)    Positive patch test   Sulfa Antibiotics Itching    Family History  Problem Relation Age of Onset   Diabetes Mellitus II Mother    Hypertension Mother    Diabetes Mellitus II Brother      Prior to Admission medications   Medication Sig Start Date End Date Taking? Authorizing Provider  Acetaminophen 500 MG capsule Take 1,000 mg by mouth every 6 (six) hours as needed. 10/12/20  Yes [provider]  aspirin 81 MG chewable tablet Chew 81 mg by mouth daily.   Yes [provider]  atorvastatin (LIPITOR) 10 MG tablet Take 10 mg by mouth daily.   Yes [provider]  benzonatate (TESSALON) 100 MG capsule Take 100 mg by mouth daily as needed for cough.   Yes [provider]  bisacodyl (DULCOLAX) 10 MG suppository Place 1 suppository (10 mg total) rectally daily as needed for moderate constipation. 01/02/21  Yes Vann, Jessica U, DO  CRANBERRY PO Take by mouth daily.   Yes [provider]  dextromethorphan-guaiFENesin (MUCINEX DM) 30-600 MG 12hr tablet Take 1 tablet by mouth every 12 (twelve) hours.   Yes [provider]  DULoxetine (CYMBALTA) 30 MG capsule Take 30 mg by mouth daily. 10/15/21  Yes [provider]  DULoxetine (CYMBALTA) 60 MG capsule Take 60 mg by mouth daily. 09/11/21  Yes [provider]  ELIQUIS 5 MG TABS tablet Take 5 mg by mouth every other day. 09/22/21  Yes [provider]  Emollient [facility]) OINT Apply 1 application. topically daily. To feet.   Yes [provider]  Ergocalciferol 50 MCG (2000 UT) TABS Take 2 tablets by mouth daily.   Yes [provider]  fluticasone (FLONASE) 50 MCG/ACT nasal spray Place 1 spray into both nostrils daily.   Yes [provider]  lidocaine (LMX) 4 % cream Apply 1 Application topically daily. Apply to left shoulder.   Yes [provider]  LINZESS 290 MCG CAPS capsule Take 290 mcg by mouth daily before breakfast. 06/05/22  Yes [provider]  meclizine (ANTIVERT) 25 MG tablet Take 25 mg by mouth daily as needed. 05/21/22  Yes [provider]  metFORMIN (GLUCOPHAGE) 500 MG tablet Take 500 mg by mouth 2  (two) times daily with a meal. 02/06/23  Yes [provider]  Multiple Vitamin (MULTIVITAMIN) tablet Take 1 tablet by mouth daily.   Yes [provider]  naloxone (NARCAN) nasal spray 4 mg/0.1 mL Place 1 spray into the nose once.   Yes [provider]  omeprazole (PRILOSEC) 40 MG capsule Take 40 mg by mouth daily. 12/03/22  Yes [provider]  ondansetron (ZOFRAN-ODT) 4 MG disintegrating tablet Take 4 mg by mouth every 8 (eight) hours as needed for vomiting or nausea. 02/04/23  Yes [provider]  Polyethylene Glycol 400 (VISINE DRY EYE RELIEF) 1 % SOLN Place 2 drops into both eyes 2 (two) times daily.   Yes [provider]  polyethylene glycol powder (GLYCOLAX/MIRALAX) 17 GM/SCOOP powder Take 1 Container by mouth daily.   Yes [provider]  prednisoLONE acetate (PRED FORTE) 1 % ophthalmic suspension Place 1 drop into the left eye daily.   Yes [provider]  pregabalin (LYRICA) 150 MG capsule Take 150 mg by mouth 2 (two) times daily. 09/25/21  Yes [provider]  promethazine (PHENERGAN) 12.5 MG tablet Take 12.5 mg by mouth every 6 (six) hours as needed for vomiting or nausea. 02/15/23  Yes [provider]  senna-docusate (SENOKOT-S) 8.6-50 MG tablet Take 1 tablet by mouth 2 (two) times daily.   Yes [provider]  traZODone (DESYREL) 50 MG tablet Take 50 mg by mouth at bedtime. 09/11/21  Yes [provider]  Artificial Saliva (ORAL RELIEF FOR DRY MOUTH) LOZG by Transmucosal route.    [provider]  D-Mannose 500 MG CAPS Take by mouth daily. AZO    [provider]  ondansetron (ZOFRAN) 4 MG tablet Take 4 mg by mouth every 6 (six) hours as needed for nausea. Patient not taking: Reported on 02/18/2023    [provider]  oxyCODONE (OXY IR/ROXICODONE) 5 MG immediate release tablet Take 5 mg by mouth every 4 (four) hours as needed for moderate pain. Patient not taking:  Reported on 02/18/2023 01/18/23   [provider]  hydrochlorothiazide (HYDRODIURIL) 25 MG tablet Take 1 tablet by mouth daily. Patient not taking: Reported on 12/23/2020 12/14/16 12/24/20  [provider]    Physical Exam: Vitals:   02/18/23 0700 02/18/23 0730 02/18/23 0745 02/18/23 0848  BP: 94/63 (!) 97/56 92/63   Pulse: 83 84 82   Resp: 16 15 17    Temp:    97.9 F (36.6 C)  TempSrc:      SpO2: 98% 96% 98%   Weight:      Height:       Physical Exam Constitutional:      Appearance: She is obese.  HENT:     Head: Normocephalic and atraumatic.     Nose: Nose normal.     Mouth/Throat:     Mouth: Mucous membranes are moist.  Cardiovascular:     Rate and Rhythm: Normal rate and regular rhythm.  Pulmonary:     Effort: Pulmonary effort is normal.  Abdominal:     Comments: Obese abdomen  Musculoskeletal:     Comments: Baseline quadriplegia  Neurological:     General: No focal deficit present.     Comments: Baseline quadriplegia      Data Reviewed:  There are no new results to review at this time. CT ABDOMEN PELVIS W CONTRAST CLINICAL DATA:  57 year old paraplegic female with history of hypotension, hypoxia, fever, weakness and possible sepsis.  EXAM: CT ANGIOGRAPHY CHEST  CT ABDOMEN AND PELVIS WITH CONTRAST  TECHNIQUE: Multidetector CT imaging of the chest was performed using the standard protocol during bolus administration of intravenous contrast. Multiplanar CT image reconstructions and MIPs were obtained to evaluate the vascular anatomy. Multidetector CT imaging of the abdomen and pelvis was performed using the standard protocol during bolus administration of intravenous contrast.  RADIATION DOSE REDUCTION: This exam was performed according to the departmental dose-optimization program which includes automated exposure control, adjustment of the mA and/or kV according to patient size and/or use of iterative reconstruction technique.  CONTRAST:   OMNIPAQUE IOHEXOL 350 MG/ML SOLN  COMPARISON:  CTA of the chest, abdomen and pelvis 01/08/2023.  FINDINGS: CTA CHEST FINDINGS  Cardiovascular: There are no filling defects within the pulmonary arterial tree to suggest pulmonary embolism. Heart size is enlarged. There is no significant pericardial fluid, thickening or pericardial calcification. Mild dilatation of the pulmonic trunk (3.5 cm in diameter), suggestive of pulmonary arterial hypertension.  Mediastinum/Nodes: No pathologically enlarged mediastinal or hilar lymph nodes. Esophagus is unremarkable in appearance. No axillary lymphadenopathy.  Lungs/Pleura: Scattered areas of architectural distortion in the left lung are most compatible with areas of subsegmental atelectasis and/or scarring. No confluent consolidative airspace disease. No pleural effusions. No definite suspicious appearing pulmonary nodules or masses are noted.  Musculoskeletal: Orthopedic fixation hardware in the lower cervical spine partially imaged. There are no aggressive appearing lytic or blastic lesions noted in the visualized portions of the skeleton.  Review of the MIP images confirms the above findings.  CT ABDOMEN and PELVIS FINDINGS  Hepatobiliary: No suspicious cystic or solid hepatic lesions. No intra or extrahepatic biliary ductal dilatation. Gallbladder is unremarkable in appearance.  Pancreas: No pancreatic mass. No pancreatic ductal dilatation. No pancreatic or peripancreatic fluid collections or inflammatory changes.  Spleen: Unremarkable.  Adrenals/Urinary Tract: Left kidney and bilateral adrenal glands are normal in appearance. Mild right hydroureteronephrosis with mild diffuse urothelial enhancement and thickening. Heterogeneous hypoperfusion of the right kidney, most evident during delayed imaging where there is slightly decreased excretion of contrast material by the right kidney. Urinary bladder is nearly  completely decompressed around an indwelling Foley balloon catheter. Notably, the tip of the catheter is in close proximity to the right ureterovesicular junction. Small amount of gas non dependently within the lumen of the urinary bladder.  Stomach/Bowel: The appearance of the stomach is normal. No pathologic dilatation of small bowel or colon. Normal appendix.  Vascular/Lymphatic: Atherosclerosis in the abdominal and pelvic vasculature, without evidence of aneurysm or dissection. No lymphadenopathy noted in the abdomen or pelvis.  Reproductive: Uterus and ovaries are unremarkable in appearance.  Other: No significant volume of ascites.  No pneumoperitoneum.  Musculoskeletal: There are no aggressive appearing lytic or blastic lesions noted in the visualized portions of the skeleton.  Review of the MIP images confirms the above findings.  IMPRESSION: 1. No evidence of pulmonary embolism. 2. Mild right-sided hydroureteronephrosis with diffuse right-sided urothelial enhancement, and heterogeneous hypoperfusion of the  right kidney. Findings are concerning for probable right-sided pyelonephritis. Correlation with urinalysis is recommended. No obstructing stone identified along the course of the right ureter. The Foley balloon catheter tip is closely approximated to the right ureterovesicular junction, which could be causing some local mass effect upon the right UVJ resulting in mild obstruction. 3. Dilatation of the pulmonic trunk (3.5 cm in diameter) which may suggest pulmonary arterial hypertension. 4. Aortic atherosclerosis.  Electronically Signed   By: Trudie Reed M.D.   On: 02/18/2023 06:28 CT Angio Chest PE W and/or Wo Contrast CLINICAL DATA:  57 year old paraplegic female with history of hypotension, hypoxia, fever, weakness and possible sepsis.  EXAM: CT ANGIOGRAPHY CHEST  CT ABDOMEN AND PELVIS WITH CONTRAST  TECHNIQUE: Multidetector CT imaging of the chest  was performed using the standard protocol during bolus administration of intravenous contrast. Multiplanar CT image reconstructions and MIPs were obtained to evaluate the vascular anatomy. Multidetector CT imaging of the abdomen and pelvis was performed using the standard protocol during bolus administration of intravenous contrast.  RADIATION DOSE REDUCTION: This exam was performed according to the departmental dose-optimization program which includes automated exposure control, adjustment of the mA and/or kV according to patient size and/or use of iterative reconstruction technique.  CONTRAST:  OMNIPAQUE IOHEXOL 350 MG/ML SOLN  COMPARISON:  CTA of the chest, abdomen and pelvis 01/08/2023.  FINDINGS: CTA CHEST FINDINGS  Cardiovascular: There are no filling defects within the pulmonary arterial tree to suggest pulmonary embolism. Heart size is enlarged. There is no significant pericardial fluid, thickening or pericardial calcification. Mild dilatation of the pulmonic trunk (3.5 cm in diameter), suggestive of pulmonary arterial hypertension.  Mediastinum/Nodes: No pathologically enlarged mediastinal or hilar lymph nodes. Esophagus is unremarkable in appearance. No axillary lymphadenopathy.  Lungs/Pleura: Scattered areas of architectural distortion in the left lung are most compatible with areas of subsegmental atelectasis and/or scarring. No confluent consolidative airspace disease. No pleural effusions. No definite suspicious appearing pulmonary nodules or masses are noted.  Musculoskeletal: Orthopedic fixation hardware in the lower cervical spine partially imaged. There are no aggressive appearing lytic or blastic lesions noted in the visualized portions of the skeleton.  Review of the MIP images confirms the above findings.  CT ABDOMEN and PELVIS FINDINGS  Hepatobiliary: No suspicious cystic or solid hepatic lesions. No intra or extrahepatic biliary ductal  dilatation. Gallbladder is unremarkable in appearance.  Pancreas: No pancreatic mass. No pancreatic ductal dilatation. No pancreatic or peripancreatic fluid collections or inflammatory changes.  Spleen: Unremarkable.  Adrenals/Urinary Tract: Left kidney and bilateral adrenal glands are normal in appearance. Mild right hydroureteronephrosis with mild diffuse urothelial enhancement and thickening. Heterogeneous hypoperfusion of the right kidney, most evident during delayed imaging where there is slightly decreased excretion of contrast material by the right kidney. Urinary bladder is nearly completely decompressed around an indwelling Foley balloon catheter. Notably, the tip of the catheter is in close proximity to the right ureterovesicular junction. Small amount of gas non dependently within the lumen of the urinary bladder.  Stomach/Bowel: The appearance of the stomach is normal. No pathologic dilatation of small bowel or colon. Normal appendix.  Vascular/Lymphatic: Atherosclerosis in the abdominal and pelvic vasculature, without evidence of aneurysm or dissection. No lymphadenopathy noted in the abdomen or pelvis.  Reproductive: Uterus and ovaries are unremarkable in appearance.  Other: No significant volume of ascites.  No pneumoperitoneum.  Musculoskeletal: There are no aggressive appearing lytic or blastic lesions noted in the visualized portions of the skeleton.  Review of the MIP  images confirms the above findings.  IMPRESSION: 1. No evidence of pulmonary embolism. 2. Mild right-sided hydroureteronephrosis with diffuse right-sided urothelial enhancement, and heterogeneous hypoperfusion of the right kidney. Findings are concerning for probable right-sided pyelonephritis. Correlation with urinalysis is recommended. No obstructing stone identified along the course of the right ureter. The Foley balloon catheter tip is closely approximated to the right ureterovesicular  junction, which could be causing some local mass effect upon the right UVJ resulting in mild obstruction. 3. Dilatation of the pulmonic trunk (3.5 cm in diameter) which may suggest pulmonary arterial hypertension. 4. Aortic atherosclerosis.  Electronically Signed   By: Trudie Reed M.D.   On: 02/18/2023 06:28 DG Chest Port 1 View CLINICAL DATA:  57 year old female with sepsis.  EXAM: PORTABLE CHEST 1 VIEW  COMPARISON:  Chest x-ray 01/08/2023.  FINDINGS: Lung volumes are low. Poorly defined bibasilar opacities which may reflect areas of atelectasis and/or consolidation. Small bilateral pleural effusions. There is cephalization of the pulmonary vasculature and slight indistinctness of the interstitial markings suggestive of mild pulmonary edema. Cardiomegaly. The patient is rotated to the right on today's exam, resulting in distortion of the mediastinal contours and reduced diagnostic sensitivity and specificity for mediastinal pathology. Orthopedic fixation hardware in the lower cervical spine incidentally noted.  IMPRESSION: 1. The appearance the chest suggests congestive heart failure, as above. 2. Bibasilar opacities which may reflect areas of subsegmental atelectasis and/or developing airspace consolidation.  Electronically Signed   By: Trudie Reed M.D.   On: 02/18/2023 05:24  Lab Results  Component Value Date   WBC 9.3 02/18/2023   HGB 11.6 (L) 02/18/2023   HCT 35.8 (L) 02/18/2023   MCV 84.8 02/18/2023   PLT 291 02/18/2023   Last metabolic panel Lab Results  Component Value Date   GLUCOSE 89 02/18/2023   NA 137 02/18/2023   K 4.2 02/18/2023   CL 99 02/18/2023   CO2 28 02/18/2023   BUN 17 02/18/2023   CREATININE 1.38 (H) 02/18/2023   GFRNONAA 45 (L) 02/18/2023   CALCIUM 8.0 (L) 02/18/2023   PHOS 3.6 12/28/2020   PROT 6.9 02/18/2023   ALBUMIN 2.9 (L) 02/18/2023   BILITOT 0.5 02/18/2023   ALKPHOS 99 02/18/2023   AST 24 02/18/2023   ALT 27  02/18/2023   ANIONGAP 10 02/18/2023    Assessment and Plan: * Sepsis [facility]) Patient meeting severe sepsis criteria with noted Tmax of 103, heart rate 100s, systolic blood pressure in the 90s White count stable at 9.3 Urinalysis indicative of infection with CT imaging concerning for right-sided pyelonephritis Noted history of complicated UTI in the past in the setting of chronic indwelling Foley associated with quadriplegia Prior culture shows pansensitive E. coli Started on IV cefepime, Flagyl vancomycin in ER Will transition to IV Rocephin and vancomycin for urinary coverage pending repeat cultures  Foley exchanged in the ER Pancultured Lactate stable at 1 IV fluid hydration Monitor  Acute pyelonephritis Noted sepsis on presentation with temperature 103, heart rate 100s with urinalysis indicative of infection and CT imaging showing right-sided pyelonephritis Chronic indwelling Foley associated with paraplegia this is a confounding issue Urine cultures June 2024 with noted E. 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 (HCC) Noted chronic bedbound status  Acute respiratory failure with hypoxia (HCC) New O2 requirement at  2 L in the setting of sepsis with pyelonephritis No overt pulmonary infection noted at present Suspect secondary to sepsis Baseline body habitus is likely confounding issue Continue supplemental oxygen for now Otherwise continue to monitor closely  CAD (coronary artery disease) Baseline CAD with noted admission June of this year for minimal to mild NSTEMI in the setting of SIRS/sepsis No active chest pain Continue home regimen  HLD (hyperlipidemia) Statin  HTN (hypertension) Soft blood  pressures in the setting of active sepsis Hold blood pressure regimen for now    Greater than 50% was spent in counseling and coordination of care with patient Total encounter time 80 minutes or more   Advance Care Planning:   Code Status: Full Code   Consults: None at present   Family Communication: No family at the bedside   Severity of Illness: The appropriate patient status for this patient is INPATIENT. Inpatient status is judged to be reasonable and necessary in order to provide the required intensity of service to ensure the patient's safety. The patient's presenting symptoms, physical exam findings, and initial radiographic and laboratory data in the context of their chronic comorbidities is felt to place them at high risk for further clinical deterioration. Furthermore, it is not anticipated that the patient will be medically stable for discharge from the hospital within 2 midnights of admission.   * I certify that at the point of admission it is my clinical judgment that the patient will require inpatient hospital care spanning beyond 2 midnights from the point of admission due to high intensity of service, high risk for further deterioration and high frequency of surveillance required.*  Author: Floydene Flock, MD 02/18/2023 10:43 AM  For on call review [email or web address].

## 2023-02-18 NOTE — Assessment & Plan Note (Addendum)
Noted sepsis on presentation with temperature 103, heart rate 100s with urinalysis indicative of infection and CT imaging showing right-sided pyelonephritis Chronic indwelling Foley associated with paraplegia this is a confounding issue Urine cultures June 2024 with noted E. coli Enterococcus pansensitive Started on IV cefepime, Flagyl and vancomycin in the setting of sepsis workup Will transition to IV Rocephin and vancomycin pending repeat urine culture Follow closely

## 2023-02-18 NOTE — ED Notes (Signed)
Lunch tray at bedside, pt continues resting

## 2023-02-18 NOTE — ED Provider Notes (Signed)
[facility] Provider Note    Event Date/Time   First MD Initiated Contact with Patient 02/18/23 [phone]     (approximate)   History   Code Sepsis   HPI  Wendy Nelson is a 57 y.o. female who presents to the ED for evaluation of Code Sepsis   Review medical DC summary from 6/29.  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  Associated neurogenic bladder with chronic Foley, CAD, HLD.  She was admitted then for an NSTEMI.  Likely secondary to a complicated UTI.  Patient presents to the ED via EMS from a local SNF for evaluation of low blood pressure.  EMS found her with sats in the 80s improved with nasal cannula.  Patient is sleepy and has no complaints.  Denies pain  Physical Exam   Triage Vital Signs: ED Triage Vitals  Encounter Vitals Group     BP      Systolic BP Percentile      Diastolic BP Percentile      Pulse      Resp      Temp      Temp src      SpO2      Weight      Height      Head Circumference      Peak Flow      Pain Score      Pain Loc      Pain Education      Exclude from Growth Chart     Most recent vital signs: Vitals:   02/18/23 0446 02/18/23 0535  BP:    Pulse:    Resp:    Temp:  (!) 103.9 F (39.9 C)  SpO2: 92%     General: Somnolent, no distress.  Morbidly obese and bedbound CV:  Good peripheral perfusion.  Resp:  Normal effort.  Abd:  No distention.  MSK:  No deformity noted.  Neuro:  No focal deficits appreciated. Other:     ED Results / Procedures / Treatments   Labs (all labs ordered are listed, but only abnormal results are displayed) Labs Reviewed  COMPREHENSIVE METABOLIC PANEL - Abnormal; Notable for the following components:      Result Value   Creatinine, Ser 1.38 (*)    Calcium 8.0 (*)    Albumin 2.9 (*)    GFR, Estimated 45 (*)    All other components within normal limits   CBC WITH DIFFERENTIAL/PLATELET - Abnormal; Notable for the following components:   Hemoglobin 11.6 (*)    HCT 35.8 (*)    All other components within normal limits  PROTIME-INR - Abnormal; Notable for the following components:   Prothrombin Time 16.4 (*)    INR 1.3 (*)    All other components within normal limits  TROPONIN I (HIGH SENSITIVITY) - Abnormal; Notable for the following components:   Troponin I (High Sensitivity) 95 (*)    All other components within normal limits  SARS CORONAVIRUS 2 BY RT PCR  CULTURE, BLOOD (ROUTINE X 2)  CULTURE, BLOOD (ROUTINE X 2)  LACTIC ACID, PLASMA  APTT  PROCALCITONIN  LACTIC ACID, PLASMA  URINALYSIS, W/ REFLEX TO CULTURE (INFECTION SUSPECTED)  TROPONIN I (HIGH SENSITIVITY)    EKG Sinus rhythm with a rate of 102 bpm.  Normal axis and intervals.  No signs of acute ischemia.  RADIOLOGY 1 view CXR interpreted by me with bibasilar  opacities CTA chest interpreted by me without PE CT abdomen/pelvis interpreted by me with signs of right-sided hydronephrosis and possible pyelonephritis  Official radiology report(s): CT Angio Chest PE W and/or Wo Contrast  Result Date: 02/18/2023 CLINICAL DATA:  57 year old paraplegic female with history of hypotension, hypoxia, fever, weakness and possible sepsis. EXAM: CT ANGIOGRAPHY CHEST CT ABDOMEN AND PELVIS WITH CONTRAST TECHNIQUE: Multidetector CT imaging of the chest was performed using the standard protocol during bolus administration of intravenous contrast. Multiplanar CT image reconstructions and MIPs were obtained to evaluate the vascular anatomy. Multidetector CT imaging of the abdomen and pelvis was performed using the standard protocol during bolus administration of intravenous contrast. RADIATION DOSE REDUCTION: This exam was performed according to the departmental dose-optimization program which includes automated exposure control, adjustment of the mA and/or kV according to patient size and/or use of  iterative reconstruction technique. CONTRAST:  OMNIPAQUE IOHEXOL 350 MG/ML SOLN COMPARISON:  CTA of the chest, abdomen and pelvis 01/08/2023. FINDINGS: CTA CHEST FINDINGS Cardiovascular: There are no filling defects within the pulmonary arterial tree to suggest pulmonary embolism. Heart size is enlarged. There is no significant pericardial fluid, thickening or pericardial calcification. Mild dilatation of the pulmonic trunk (3.5 cm in diameter), suggestive of pulmonary arterial hypertension. Mediastinum/Nodes: No pathologically enlarged mediastinal or hilar lymph nodes. Esophagus is unremarkable in appearance. No axillary lymphadenopathy. Lungs/Pleura: Scattered areas of architectural distortion in the left lung are most compatible with areas of subsegmental atelectasis and/or scarring. No confluent consolidative airspace disease. No pleural effusions. No definite suspicious appearing pulmonary nodules or masses are noted. Musculoskeletal: Orthopedic fixation hardware in the lower cervical spine partially imaged. There are no aggressive appearing lytic or blastic lesions noted in the visualized portions of the skeleton. Review of the MIP images confirms the above findings. CT ABDOMEN and PELVIS FINDINGS Hepatobiliary: No suspicious cystic or solid hepatic lesions. No intra or extrahepatic biliary ductal dilatation. Gallbladder is unremarkable in appearance. Pancreas: No pancreatic mass. No pancreatic ductal dilatation. No pancreatic or peripancreatic fluid collections or inflammatory changes. Spleen: Unremarkable. Adrenals/Urinary Tract: Left kidney and bilateral adrenal glands are normal in appearance. Mild right hydroureteronephrosis with mild diffuse urothelial enhancement and thickening. Heterogeneous hypoperfusion of the right kidney, most evident during delayed imaging where there is slightly decreased excretion of contrast material by the right kidney. Urinary bladder is nearly completely decompressed  around an indwelling Foley balloon catheter. Notably, the tip of the catheter is in close proximity to the right ureterovesicular junction. Small amount of gas non dependently within the lumen of the urinary bladder. Stomach/Bowel: The appearance of the stomach is normal. No pathologic dilatation of small bowel or colon. Normal appendix. Vascular/Lymphatic: Atherosclerosis in the abdominal and pelvic vasculature, without evidence of aneurysm or dissection. No lymphadenopathy noted in the abdomen or pelvis. Reproductive: Uterus and ovaries are unremarkable in appearance. Other: No significant volume of ascites.  No pneumoperitoneum. Musculoskeletal: There are no aggressive appearing lytic or blastic lesions noted in the visualized portions of the skeleton. Review of the MIP images confirms the above findings. IMPRESSION: 1. No evidence of pulmonary embolism. 2. Mild right-sided hydroureteronephrosis with diffuse right-sided urothelial enhancement, and heterogeneous hypoperfusion of the right kidney. Findings are concerning for probable right-sided pyelonephritis. Correlation with urinalysis is recommended. No obstructing stone identified along the course of the right ureter. The Foley balloon catheter tip is closely approximated to the right ureterovesicular junction, which could be causing some local mass effect upon the right UVJ resulting in mild obstruction. 3. Dilatation  of the pulmonic trunk (3.5 cm in diameter) which may suggest pulmonary arterial hypertension. 4. Aortic atherosclerosis. Electronically Signed   By: Trudie Reed M.D.   On: 02/18/2023 06:28   CT ABDOMEN PELVIS W CONTRAST  Result Date: 02/18/2023 CLINICAL DATA:  57 year old paraplegic female with history of hypotension, hypoxia, fever, weakness and possible sepsis. EXAM: CT ANGIOGRAPHY CHEST CT ABDOMEN AND PELVIS WITH CONTRAST TECHNIQUE: Multidetector CT imaging of the chest was performed using the standard protocol during bolus  administration of intravenous contrast. Multiplanar CT image reconstructions and MIPs were obtained to evaluate the vascular anatomy. Multidetector CT imaging of the abdomen and pelvis was performed using the standard protocol during bolus administration of intravenous contrast. RADIATION DOSE REDUCTION: This exam was performed according to the departmental dose-optimization program which includes automated exposure control, adjustment of the mA and/or kV according to patient size and/or use of iterative reconstruction technique. CONTRAST:  OMNIPAQUE IOHEXOL 350 MG/ML SOLN COMPARISON:  CTA of the chest, abdomen and pelvis 01/08/2023. FINDINGS: CTA CHEST FINDINGS Cardiovascular: There are no filling defects within the pulmonary arterial tree to suggest pulmonary embolism. Heart size is enlarged. There is no significant pericardial fluid, thickening or pericardial calcification. Mild dilatation of the pulmonic trunk (3.5 cm in diameter), suggestive of pulmonary arterial hypertension. Mediastinum/Nodes: No pathologically enlarged mediastinal or hilar lymph nodes. Esophagus is unremarkable in appearance. No axillary lymphadenopathy. Lungs/Pleura: Scattered areas of architectural distortion in the left lung are most compatible with areas of subsegmental atelectasis and/or scarring. No confluent consolidative airspace disease. No pleural effusions. No definite suspicious appearing pulmonary nodules or masses are noted. Musculoskeletal: Orthopedic fixation hardware in the lower cervical spine partially imaged. There are no aggressive appearing lytic or blastic lesions noted in the visualized portions of the skeleton. Review of the MIP images confirms the above findings. CT ABDOMEN and PELVIS FINDINGS Hepatobiliary: No suspicious cystic or solid hepatic lesions. No intra or extrahepatic biliary ductal dilatation. Gallbladder is unremarkable in appearance. Pancreas: No pancreatic mass. No pancreatic ductal dilatation.  No pancreatic or peripancreatic fluid collections or inflammatory changes. Spleen: Unremarkable. Adrenals/Urinary Tract: Left kidney and bilateral adrenal glands are normal in appearance. Mild right hydroureteronephrosis with mild diffuse urothelial enhancement and thickening. Heterogeneous hypoperfusion of the right kidney, most evident during delayed imaging where there is slightly decreased excretion of contrast material by the right kidney. Urinary bladder is nearly completely decompressed around an indwelling Foley balloon catheter. Notably, the tip of the catheter is in close proximity to the right ureterovesicular junction. Small amount of gas non dependently within the lumen of the urinary bladder. Stomach/Bowel: The appearance of the stomach is normal. No pathologic dilatation of small bowel or colon. Normal appendix. Vascular/Lymphatic: Atherosclerosis in the abdominal and pelvic vasculature, without evidence of aneurysm or dissection. No lymphadenopathy noted in the abdomen or pelvis. Reproductive: Uterus and ovaries are unremarkable in appearance. Other: No significant volume of ascites.  No pneumoperitoneum. Musculoskeletal: There are no aggressive appearing lytic or blastic lesions noted in the visualized portions of the skeleton. Review of the MIP images confirms the above findings. IMPRESSION: 1. No evidence of pulmonary embolism. 2. Mild right-sided hydroureteronephrosis with diffuse right-sided urothelial enhancement, and heterogeneous hypoperfusion of the right kidney. Findings are concerning for probable right-sided pyelonephritis. Correlation with urinalysis is recommended. No obstructing stone identified along the course of the right ureter. The Foley balloon catheter tip is closely approximated to the right ureterovesicular junction, which could be causing some local mass effect upon the right UVJ resulting  in mild obstruction. 3. Dilatation of the pulmonic trunk (3.5 cm in diameter) which may  suggest pulmonary arterial hypertension. 4. Aortic atherosclerosis. Electronically Signed   By: Trudie Reed M.D.   On: 02/18/2023 06:28   DG Chest Port 1 View  Result Date: 02/18/2023 CLINICAL DATA:  57 year old female with sepsis. EXAM: PORTABLE CHEST 1 VIEW COMPARISON:  Chest x-ray 01/08/2023. FINDINGS: Lung volumes are low. Poorly defined bibasilar opacities which may reflect areas of atelectasis and/or consolidation. Small bilateral pleural effusions. There is cephalization of the pulmonary vasculature and slight indistinctness of the interstitial markings suggestive of mild pulmonary edema. Cardiomegaly. The patient is rotated to the right on today's exam, resulting in distortion of the mediastinal contours and reduced diagnostic sensitivity and specificity for mediastinal pathology. Orthopedic fixation hardware in the lower cervical spine incidentally noted. IMPRESSION: 1. The appearance the chest suggests congestive heart failure, as above. 2. Bibasilar opacities which may reflect areas of subsegmental atelectasis and/or developing airspace consolidation. Electronically Signed   By: Trudie Reed M.D.   On: 02/18/2023 05:24    PROCEDURES and INTERVENTIONS:  .1-3 Lead EKG Interpretation  Performed by: Delton Prairie, MD Authorized by: Delton Prairie, MD     Interpretation: abnormal     ECG rate:  102   ECG rate assessment: tachycardic     Rhythm: sinus tachycardia     Ectopy: none     Conduction: normal   .Critical Care  Performed by: Delton Prairie, MD Authorized by: Delton Prairie, MD   Critical care provider statement:    Critical care time (minutes):  30   Critical care time was exclusive of:  Separately billable procedures and treating other patients   Critical care was necessary to treat or prevent imminent or life-threatening deterioration of the following conditions:  Sepsis   Critical care was time spent personally by me on the following activities:  Development of treatment  plan with patient or surrogate, discussions with consultants, evaluation of patient's response to treatment, examination of patient, ordering and review of laboratory studies, ordering and review of radiographic studies, ordering and performing treatments and interventions, pulse oximetry, re-evaluation of patient's condition and review of old charts   Medications  vancomycin (VANCOREADY) IVPB 2000 mg/400 mL (2,000 mg Intravenous New Bag/Given 02/18/23 0540)    Followed by  vancomycin (VANCOREADY) IVPB 500 mg/100 mL (has no administration in time range)  lactated ringers bolus 1,000 mL (has no administration in time range)  acetaminophen (TYLENOL) tablet 1,000 mg (1,000 mg Oral Given 02/18/23 0512)  lactated ringers bolus 1,000 mL (1,000 mLs Intravenous New Bag/Given 02/18/23 0502)  ceFEPIme (MAXIPIME) 2 g in sodium chloride 0.9 % 100 mL IVPB (0 g Intravenous Stopped 02/18/23 0539)  iohexol (OMNIPAQUE) 350 MG/ML injection 100 mL (100 mLs Intravenous Contrast Given 02/18/23 0608)     IMPRESSION / MDM / ASSESSMENT AND PLAN / ED COURSE  I reviewed the triage vital signs and the nursing notes.  Differential diagnosis includes, but is not limited to, viral syndrome, urosepsis, pneumonia, PE  {Patient presents with symptoms of an acute illness or injury that is potentially life-threatening.  Patient presents from her SNF for evaluation of low BP, hypoxia and fever concerning for sepsis. New hypoxia requiring nasal cannula.  No peritoneal abdominal tenderness.  She cannot provide any relevant history regarding her symptoms.  Blood work with AKI.  No leukocytosis.  Troponin is mildly elevated.  CT is without pneumonia or PE.  CT abdomen/pelvis concerning for urologic source, which is  likely considering her indwelling Foley status.  We replaced her Foley catheter and urinalysis pending, taken from this fresh catheter.  She is provided broad-spectrum antibiotics.  Will consult with medicine after her urine  results.  Clinical Course as of 02/18/23 0704  Mon Feb 18, 2023  8119 Urine culture from 6/25 with E. coli and Enterococcus, pansensitive [DS]  0540 reassessed [DS]  0659 Admit for sepsis - history of foley, exchanged and abx given.  Waiting for UA. CT w/ signs of pyelo.  [SM]    Clinical Course User Index [DS] Delton Prairie, MD [SM] Corena Herter, MD     FINAL CLINICAL IMPRESSION(S) / ED DIAGNOSES   Final diagnoses:  Hypoxia  Sepsis with encephalopathy without septic shock, due to unspecified organism [facility])     Rx / DC Orders   ED Discharge Orders     None        Note:  This document was prepared using Dragon voice recognition software and may include unintentional dictation errors.   Delton Prairie, MD 02/18/23 [phone]

## 2023-02-18 NOTE — Assessment & Plan Note (Signed)
Noted chronic bedbound status

## 2023-02-18 NOTE — ED Provider Notes (Signed)
Care assumed of patient from outgoing provider.  See their note for initial history, exam and plan.  Clinical Course as of 02/18/23 6962  [facility] Feb 18, 2023  9528 Urine culture from 6/25 with E. coli and Enterococcus, pansensitive [DS]  0540 reassessed [DS]  0659 Admit for sepsis - history of foley, exchanged and abx given.  Waiting for UA. CT w/ signs of pyelo.  [SM]    Clinical Course User Index [DS] Delton Prairie, MD [SM] Corena Herter, MD   UA concerning for urinary tract infection.  Patient has already received vancomycin and cefepime.  Consulted hospitalist for admission   Corena Herter, MD 02/18/23 [phone]

## 2023-02-18 NOTE — ED Notes (Signed)
Pt assisted with dinner set up.

## 2023-02-18 NOTE — Assessment & Plan Note (Addendum)
Baseline CAD with noted admission June of this year for minimal to mild NSTEMI in the setting of SIRS/sepsis No active chest pain Continue home regimen

## 2023-02-18 NOTE — Assessment & Plan Note (Addendum)
Soft blood pressures in the setting of active sepsis Hold blood pressure regimen for now

## 2023-02-19 DIAGNOSIS — A419 Sepsis, unspecified organism: Secondary | ICD-10-CM | POA: Diagnosis not present

## 2023-02-19 LAB — GLUCOSE, CAPILLARY
Glucose-Capillary: 121 mg/dL — ABNORMAL HIGH (ref 70–99)
Glucose-Capillary: 104 mg/dL — ABNORMAL HIGH (ref 70–99)
Glucose-Capillary: 88 mg/dL (ref 70–99)

## 2023-02-19 MED ORDER — PREGABALIN 75 MG PO CAPS
150.0000 mg | ORAL_CAPSULE | Freq: Two times a day (BID) | ORAL | Status: DC
  Administered 2023-02-19 – 2023-02-21 (×5): 150 mg via ORAL
  Filled 2023-02-19 (×5): qty 2

## 2023-02-19 MED ORDER — PREDNISOLONE ACETATE 1 % OP SUSP
1.0000 [drp] | Freq: Every day | OPHTHALMIC | Status: DC
  Administered 2023-02-19 – 2023-02-21 (×3): 1 [drp] via OPHTHALMIC
  Filled 2023-02-19: qty 1

## 2023-02-19 MED ORDER — CHLORHEXIDINE GLUCONATE CLOTH 2 % EX PADS
6.0000 | MEDICATED_PAD | Freq: Every day | CUTANEOUS | Status: DC
  Administered 2023-02-19 – 2023-02-21 (×3): 6 via TOPICAL

## 2023-02-19 MED ORDER — DULOXETINE HCL 30 MG PO CPEP: 60.0000 mg | ORAL_CAPSULE | Freq: Every day | ORAL | Status: DC

## 2023-02-19 MED ORDER — VANCOMYCIN HCL IN DEXTROSE 1-5 GM/200ML-% IV SOLN
1000.0000 mg | Freq: Two times a day (BID) | INTRAVENOUS | Status: DC
  Administered 2023-02-19 (×2): 1000 mg via INTRAVENOUS
  Filled 2023-02-19 (×3): qty 200

## 2023-02-19 MED ORDER — LINACLOTIDE 145 MCG PO CAPS
290.0000 ug | ORAL_CAPSULE | Freq: Every day | ORAL | Status: DC
  Administered 2023-02-19 – 2023-02-21 (×2): 290 ug via ORAL
  Filled 2023-02-19 (×3): qty 2

## 2023-02-19 MED ORDER — INSULIN ASPART 100 UNIT/ML IJ SOLN: 0.0000 [IU] | Freq: Three times a day (TID) | INTRAMUSCULAR | Status: DC

## 2023-02-19 MED ORDER — LIDOCAINE 4 % EX CREA
1.0000 | TOPICAL_CREAM | Freq: Every day | CUTANEOUS | Status: DC
  Filled 2023-02-19: qty 5

## 2023-02-19 MED ORDER — FLUTICASONE PROPIONATE 50 MCG/ACT NA SUSP
1.0000 | Freq: Every day | NASAL | Status: DC
  Filled 2023-02-19: qty 16

## 2023-02-19 MED ORDER — POLYVINYL ALCOHOL 1.4 % OP SOLN
2.0000 [drp] | Freq: Two times a day (BID) | OPHTHALMIC | Status: DC
  Administered 2023-02-19 – 2023-02-21 (×4): 2 [drp] via OPHTHALMIC
  Filled 2023-02-19 (×2): qty 15

## 2023-02-19 MED ORDER — METFORMIN HCL 500 MG PO TABS
500.0000 mg | ORAL_TABLET | Freq: Two times a day (BID) | ORAL | Status: DC
  Administered 2023-02-20 – 2023-02-21 (×3): 500 mg via ORAL
  Filled 2023-02-19 (×3): qty 1

## 2023-02-19 MED ORDER — DM-GUAIFENESIN ER 30-600 MG PO TB12
1.0000 | ORAL_TABLET | Freq: Two times a day (BID) | ORAL | Status: DC
  Administered 2023-02-19 – 2023-02-21 (×5): 1 via ORAL
  Filled 2023-02-19 (×5): qty 1

## 2023-02-19 MED ORDER — MECLIZINE HCL 25 MG PO TABS: 25.0000 mg | ORAL_TABLET | Freq: Every day | ORAL | Status: DC | PRN

## 2023-02-19 MED ORDER — DULOXETINE HCL 30 MG PO CPEP
60.0000 mg | ORAL_CAPSULE | Freq: Every day | ORAL | Status: DC
  Administered 2023-02-20 – 2023-02-21 (×2): 60 mg via ORAL
  Filled 2023-02-19 (×2): qty 2

## 2023-02-19 MED ORDER — INSULIN ASPART 100 UNIT/ML IJ SOLN: 0.0000 [IU] | Freq: Every day | INTRAMUSCULAR | Status: DC

## 2023-02-19 NOTE — Plan of Care (Signed)

## 2023-02-19 NOTE — Progress Notes (Signed)
PROGRESS NOTE    Wendy Nelson  EGB:[id number] DOB: 09-Jul-1966 DOA: 02/18/2023 PCP: [facility]    Brief Narrative:  Wendy Nelson is a 57 y.o. female with medical history significant of morbid obesity, quadriplegia, history of CVA, history of CAD, type 2 diabetes, hypertension presenting with sepsis, pyelonephritis.  Patient noted to be living at a local facility.  Patient noted to be hypotensive, hypoxic with fever and weakness at facility.  Patient reports generalized malaise.  No reported chest pain, shortness of breath.  No reported nausea or vomiting.  Patient overall fatigued.  Patient admitted June 2024 with similar issues with notable complicated UTI with culture showing E. coli and Enterococcus both pansensitive.  Was placed on course of amoxicillin for treatment at discharge. Presented to ER Tmax of 103.9, heart rate 100s, blood pressure 90s to 100s over 50s to 60s.  Satting in the mid 80s on room air.  Transition to 2 L nasal cannula to keep O2 sats greater than 92%.   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 coli Started on IV cefepime, Flagyl vancomycin in ER Will transition to IV Rocephin and vancomycin for urinary coverage pending repeat cultures  Foley exchanged in the  ER Pancultured Lactate stable at 1 IV fluid hydration Monitor   Acute pyelonephritis Noted sepsis on presentation with temperature 103, heart rate 100s with urinalysis indicative of infection and CT imaging showing right-sided pyelonephritis.   Chronic indwelling Foley associated with paraplegia this is a confounding issue Urine cultures June 2024 with noted E. coli Enterococcus pansensitive Started on IV cefepime, Flagyl and vancomycin in the setting of sepsis workup Plan: Continue IV Rocephin and vancomycin for today.  Monitor cultures.  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  Suspect supply/demand ischemia No active chest pain Continue home regimen   HLD (hyperlipidemia) Statin   HTN (hypertension) Soft blood pressures in the setting of active sepsis Hold blood pressure regimen for now Monitor blood pressure and restart as appropriate  Type 2 diabetes mellitus Hemoglobin A1c 7.3 on 6/27, reasonable control Okay to resume home metformin 500 mg twice daily Sliding scale with NovoLog Carb modified diet   DVT prophylaxis: Apixaban Code Status: Full Family Communication: None today Disposition Plan: Status is: Inpatient Remains inpatient appropriate because: Pyelonephritis on IV antibiotics  Level of care: Progressive  Consultants:   None  Procedures:  None  Antimicrobials: Ceftriaxone Vancomycin   Subjective: Seen and examined.  Reports weakness.  No pain complaints.  Objective: Vitals:   02/19/23 0015 02/19/23 0359 02/19/23 0500 02/19/23 0833  BP: 100/62 115/73  (!) 140/78  Pulse: 80 89  85  Resp: 17 20  18   Temp:  98.2 F (36.8 C)  99.1 F (37.3 C)  TempSrc:      SpO2: 95% 100%  96%  Weight:   (!) 136.8 kg   Height:   5\' 5"  (1.651 m)     Intake/Output Summary (Last 24 hours) at 02/19/2023 1124 Last data filed at 02/19/2023 0440 Gross per 24 hour  Intake 100 ml  Output 1000 ml  Net -900 ml   Filed Weights   02/18/23 0443 02/19/23 0500  Weight: 136.1 kg (!) 136.8 kg    Examination:  General exam: Appears calm and comfortable  Respiratory system: Clear to auscultation. Respiratory effort normal. Cardiovascular system: S1-S2, RRR, no murmurs, no pedal edema Gastrointestinal system: Soft, NT/ND, normal bowel sounds Central nervous system: Alert and oriented. No focal neurological deficits. Extremities: Lower extremity paraplegia Skin: No rashes, lesions or ulcers Psychiatry: Judgement and insight appear normal. Mood & affect appropriate.     Data Reviewed: I have personally reviewed following labs and imaging studies  CBC: Recent Labs  Lab 02/18/23 0457 02/19/23 0629  WBC 9.3 8.3  NEUTROABS 4.8  --   HGB 11.6* 12.1  HCT 35.8* 36.6  MCV 84.8 84.7  PLT 291 304   Basic Metabolic Panel: Recent Labs  Lab 02/18/23 0457 02/19/23 0629  NA 137 139  K 4.2 3.8  CL 99 103  CO2 28 24  GLUCOSE 89 100*  BUN 17 12  CREATININE 1.38* 0.92  CALCIUM 8.0* 8.4*   GFR: Estimated Creatinine Clearance: 97 mL/min (by C-G formula based on SCr of 0.92 mg/dL). Liver Function Tests: Recent Labs  Lab 02/18/23 0457 02/19/23 0629  AST 24 23  ALT 27 29  ALKPHOS 99 102  BILITOT 0.5 0.8  PROT 6.9 7.3  ALBUMIN 2.9* 3.0*   No results for input(s): "LIPASE", "AMYLASE" in the last 168 hours. No  results for input(s): "AMMONIA" in the last 168 hours. Coagulation Profile: Recent Labs  Lab 02/18/23 0457  INR 1.3*   Cardiac Enzymes: No results for input(s): "CKTOTAL", "CKMB", "CKMBINDEX", "TROPONINI" in the last 168 hours. BNP (last 3 results) No results for input(s): "PROBNP" in the last 8760 hours. HbA1C: No results for input(s): "HGBA1C" in the last 72 hours. CBG: No results for input(s): "GLUCAP" in the last 168 hours. Lipid Profile: No results for input(s): "CHOL", "HDL", "LDLCALC", "TRIG", "CHOLHDL", "LDLDIRECT" in the last 72 hours. Thyroid Function Tests: No results for input(s): "TSH", "T4TOTAL", "FREET4", "T3FREE", "THYROIDAB" in the last 72 hours. Anemia Panel: No results for input(s): "VITAMINB12", "FOLATE", "FERRITIN", "TIBC", "IRON", "RETICCTPCT" in the last 72 hours. Sepsis Labs: Recent Labs  Lab 02/18/23 0457 02/18/23 0723  PROCALCITON <0.10  --   LATICACIDVEN 1.1 0.9    Recent Results (from the past 240 hour(s))  SARS Coronavirus 2 by RT PCR (hospital order, performed in [facility] hospital lab) *cepheid single result test* Anterior Nasal Swab     Status: None   Collection Time: 02/18/23  4:57 AM   Specimen: Anterior Nasal Swab  Result Value Ref Range Status   SARS Coronavirus 2 by RT PCR NEGATIVE NEGATIVE Final    Comment: (NOTE)  SARS-CoV-2 target nucleic acids are NOT DETECTED.  The SARS-CoV-2 RNA is generally detectable in upper and lower respiratory specimens during the acute phase of infection. The lowest concentration of SARS-CoV-2 viral copies this assay can detect is 250 copies / mL. A negative result does not preclude SARS-CoV-2 infection and should not be used as the sole basis for treatment or other patient management decisions.  A negative result may occur with improper specimen collection / handling, submission of specimen other than nasopharyngeal swab, presence of viral mutation(s) within the areas targeted by this assay, and  inadequate number of viral copies (<250 copies / mL). A negative result must be combined with clinical observations, patient history, and epidemiological information.  Fact Sheet for Patients:   RoadLapTop.co.za  Fact Sheet for Healthcare Providers: http://kim-miller.com/  This test is not yet approved or  cleared by the [place] FDA and has been authorized for detection and/or diagnosis of SARS-CoV-2 by FDA under an Emergency Use Authorization (EUA).  This EUA will remain in effect (meaning this test can be used) for the duration of the COVID-19 declaration under Section 564(b)(1) of the Act, 21 U.S.C. section 360bbb-3(b)(1), unless the authorization is terminated or revoked sooner.  Performed at [facility], [address], [place], [place] [address]   Urine Culture     Status: Abnormal (Preliminary result)   Collection Time: 02/18/23  4:57 AM   Specimen: Urine, Random  Result Value Ref Range Status   Specimen Description   Final    URINE, RANDOM Performed at [facility], [address] [place], [place] [address]    Special Requests   Final    NONE Reflexed from [phone] Performed at [facility], [address], [place], [place] [address]    Culture (A)  Final    >=100,000 COLONIES/mL ENTEROCOCCUS FAECALIS SUSCEPTIBILITIES TO FOLLOW Performed at [facility], [address], [place], [place] [address]    Report Status PENDING  Incomplete  Blood Culture (routine x 2)     Status: None (Preliminary result)   Collection Time: 02/18/23  4:58 AM   Specimen: BLOOD RIGHT ARM  Result Value Ref Range Status   Specimen Description BLOOD RIGHT ARM  Final   Special Requests   Final    BOTTLES DRAWN AEROBIC AND ANAEROBIC Blood Culture results may not be optimal due to an excessive volume of blood received in culture bottles   Culture   Final    NO GROWTH 1 DAY Performed at [facility],  [address], [place], [place] [address]    Report Status PENDING  Incomplete  Blood Culture (routine x 2)     Status: None (Preliminary result)   Collection Time: 02/18/23  4:58 AM   Specimen: BLOOD RIGHT ARM  Result Value Ref Range Status   Specimen Description BLOOD RIGHT ARM  Final   Special Requests Blood Culture adequate volume  Final   Culture   Final    NO GROWTH 1 DAY Performed at [facility], [address], [place], [place] [address]    Report Status PENDING  Incomplete  MRSA Next Gen by PCR, Nasal     Status: None   Collection Time: 02/18/23  7:15 PM   Specimen: Nasal Mucosa; Nasal Swab  Result Value Ref Range Status   MRSA by PCR Next Gen NOT DETECTED NOT DETECTED Final    Comment: (NOTE) The GeneXpert MRSA Assay (FDA approved for NASAL specimens only), is one component  of a comprehensive MRSA colonization surveillance program. It is not intended to diagnose MRSA infection nor to guide or monitor treatment for MRSA infections. Test performance is not FDA approved in patients less than 66 years old. Performed at [facility], [address], [place], [place] [address]          Radiology Studies: CT Angio Chest PE W and/or Wo Contrast  Result Date: 02/18/2023 CLINICAL DATA:  57 year old paraplegic female with history of hypotension, hypoxia, fever, weakness and possible sepsis. EXAM: CT ANGIOGRAPHY CHEST CT ABDOMEN AND PELVIS WITH CONTRAST TECHNIQUE: Multidetector CT imaging of the chest was performed using the standard protocol during bolus administration of intravenous contrast. Multiplanar CT image reconstructions and MIPs were obtained to evaluate the vascular anatomy. Multidetector CT imaging of the abdomen and pelvis was performed using the standard protocol during bolus administration of intravenous contrast. RADIATION DOSE REDUCTION: This exam was performed according to the departmental dose-optimization program which includes automated  exposure control, adjustment of the mA and/or kV according to patient size and/or use of iterative reconstruction technique. CONTRAST:  OMNIPAQUE IOHEXOL 350 MG/ML SOLN COMPARISON:  CTA of the chest, abdomen and pelvis 01/08/2023. FINDINGS: CTA CHEST FINDINGS Cardiovascular: There are no filling defects within the pulmonary arterial tree to suggest pulmonary embolism. Heart size is enlarged. There is no significant pericardial fluid, thickening or pericardial calcification. Mild dilatation of the pulmonic trunk (3.5 cm in diameter), suggestive of pulmonary arterial hypertension. Mediastinum/Nodes: No pathologically enlarged mediastinal or hilar lymph nodes. Esophagus is unremarkable in appearance. No axillary lymphadenopathy. Lungs/Pleura: Scattered areas of architectural distortion in the left lung are most compatible with areas of subsegmental atelectasis and/or scarring. No confluent consolidative airspace disease. No pleural effusions. No definite suspicious appearing pulmonary nodules or masses are noted. Musculoskeletal: Orthopedic fixation hardware in the lower cervical spine partially imaged. There are no aggressive appearing lytic or blastic lesions noted in the visualized portions of the skeleton. Review of the MIP images confirms the above findings. CT ABDOMEN and PELVIS FINDINGS Hepatobiliary: No suspicious cystic or solid hepatic lesions. No intra or extrahepatic biliary ductal dilatation. Gallbladder is unremarkable in appearance. Pancreas: No pancreatic mass. No pancreatic ductal dilatation. No pancreatic or peripancreatic fluid collections or inflammatory changes. Spleen: Unremarkable. Adrenals/Urinary Tract: Left kidney and bilateral adrenal glands are normal in appearance. Mild right hydroureteronephrosis with mild diffuse urothelial enhancement and thickening. Heterogeneous hypoperfusion of the right kidney, most evident during delayed imaging where there is slightly decreased excretion of  contrast material by the right kidney. Urinary bladder is nearly completely decompressed around an indwelling Foley balloon catheter. Notably, the tip of the catheter is in close proximity to the right ureterovesicular junction. Small amount of gas non dependently within the lumen of the urinary bladder. Stomach/Bowel: The appearance of the stomach is normal. No pathologic dilatation of small bowel or colon. Normal appendix. Vascular/Lymphatic: Atherosclerosis in the abdominal and pelvic vasculature, without evidence of aneurysm or dissection. No lymphadenopathy noted in the abdomen or pelvis. Reproductive: Uterus and ovaries are unremarkable in appearance. Other: No significant volume of ascites.  No pneumoperitoneum. Musculoskeletal: There are no aggressive appearing lytic or blastic lesions noted in the visualized portions of the skeleton. Review of the MIP images confirms the above findings. IMPRESSION: 1. No evidence of pulmonary embolism. 2. Mild right-sided hydroureteronephrosis with diffuse right-sided urothelial enhancement, and heterogeneous hypoperfusion of the right kidney. Findings are concerning for probable right-sided pyelonephritis. Correlation with urinalysis is recommended. No obstructing stone identified along the course  of the right ureter. The Foley balloon catheter tip is closely approximated to the right ureterovesicular junction, which could be causing some local mass effect upon the right UVJ resulting in mild obstruction. 3. Dilatation of the pulmonic trunk (3.5 cm in diameter) which may suggest pulmonary arterial hypertension. 4. Aortic atherosclerosis. Electronically Signed   By: Trudie Reed M.D.   On: 02/18/2023 06:28   CT ABDOMEN PELVIS W CONTRAST  Result Date: 02/18/2023 CLINICAL DATA:  57 year old paraplegic female with history of hypotension, hypoxia, fever, weakness and possible sepsis. EXAM: CT ANGIOGRAPHY CHEST CT ABDOMEN AND PELVIS WITH CONTRAST TECHNIQUE: Multidetector  CT imaging of the chest was performed using the standard protocol during bolus administration of intravenous contrast. Multiplanar CT image reconstructions and MIPs were obtained to evaluate the vascular anatomy. Multidetector CT imaging of the abdomen and pelvis was performed using the standard protocol during bolus administration of intravenous contrast. RADIATION DOSE REDUCTION: This exam was performed according to the departmental dose-optimization program which includes automated exposure control, adjustment of the mA and/or kV according to patient size and/or use of iterative reconstruction technique. CONTRAST:  OMNIPAQUE IOHEXOL 350 MG/ML SOLN COMPARISON:  CTA of the chest, abdomen and pelvis 01/08/2023. FINDINGS: CTA CHEST FINDINGS Cardiovascular: There are no filling defects within the pulmonary arterial tree to suggest pulmonary embolism. Heart size is enlarged. There is no significant pericardial fluid, thickening or pericardial calcification. Mild dilatation of the pulmonic trunk (3.5 cm in diameter), suggestive of pulmonary arterial hypertension. Mediastinum/Nodes: No pathologically enlarged mediastinal or hilar lymph nodes. Esophagus is unremarkable in appearance. No axillary lymphadenopathy. Lungs/Pleura: Scattered areas of architectural distortion in the left lung are most compatible with areas of subsegmental atelectasis and/or scarring. No confluent consolidative airspace disease. No pleural effusions. No definite suspicious appearing pulmonary nodules or masses are noted. Musculoskeletal: Orthopedic fixation hardware in the lower cervical spine partially imaged. There are no aggressive appearing lytic or blastic lesions noted in the visualized portions of the skeleton. Review of the MIP images confirms the above findings. CT ABDOMEN and PELVIS FINDINGS Hepatobiliary: No suspicious cystic or solid hepatic lesions. No intra or extrahepatic biliary ductal dilatation. Gallbladder is unremarkable  in appearance. Pancreas: No pancreatic mass. No pancreatic ductal dilatation. No pancreatic or peripancreatic fluid collections or inflammatory changes. Spleen: Unremarkable. Adrenals/Urinary Tract: Left kidney and bilateral adrenal glands are normal in appearance. Mild right hydroureteronephrosis with mild diffuse urothelial enhancement and thickening. Heterogeneous hypoperfusion of the right kidney, most evident during delayed imaging where there is slightly decreased excretion of contrast material by the right kidney. Urinary bladder is nearly completely decompressed around an indwelling Foley balloon catheter. Notably, the tip of the catheter is in close proximity to the right ureterovesicular junction. Small amount of gas non dependently within the lumen of the urinary bladder. Stomach/Bowel: The appearance of the stomach is normal. No pathologic dilatation of small bowel or colon. Normal appendix. Vascular/Lymphatic: Atherosclerosis in the abdominal and pelvic vasculature, without evidence of aneurysm or dissection. No lymphadenopathy noted in the abdomen or pelvis. Reproductive: Uterus and ovaries are unremarkable in appearance. Other: No significant volume of ascites.  No pneumoperitoneum. Musculoskeletal: There are no aggressive appearing lytic or blastic lesions noted in the visualized portions of the skeleton. Review of the MIP images confirms the above findings. IMPRESSION: 1. No evidence of pulmonary embolism. 2. Mild right-sided hydroureteronephrosis with diffuse right-sided urothelial enhancement, and heterogeneous hypoperfusion of the right kidney. Findings are concerning for probable right-sided pyelonephritis. Correlation with urinalysis is recommended. No obstructing  stone identified along the course of the right ureter. The Foley balloon catheter tip is closely approximated to the right ureterovesicular junction, which could be causing some local mass effect upon the right UVJ resulting in mild  obstruction. 3. Dilatation of the pulmonic trunk (3.5 cm in diameter) which may suggest pulmonary arterial hypertension. 4. Aortic atherosclerosis. Electronically Signed   By: Trudie Reed M.D.   On: 02/18/2023 06:28   DG Chest Port 1 View  Result Date: 02/18/2023 CLINICAL DATA:  57 year old female with sepsis. EXAM: PORTABLE CHEST 1 VIEW COMPARISON:  Chest x-ray 01/08/2023. FINDINGS: Lung volumes are low. Poorly defined bibasilar opacities which may reflect areas of atelectasis and/or consolidation. Small bilateral pleural effusions. There is cephalization of the pulmonary vasculature and slight indistinctness of the interstitial markings suggestive of mild pulmonary edema. Cardiomegaly. The patient is rotated to the right on today's exam, resulting in distortion of the mediastinal contours and reduced diagnostic sensitivity and specificity for mediastinal pathology. Orthopedic fixation hardware in the lower cervical spine incidentally noted. IMPRESSION: 1. The appearance the chest suggests congestive heart failure, as above. 2. Bibasilar opacities which may reflect areas of subsegmental atelectasis and/or developing airspace consolidation. Electronically Signed   By: Trudie Reed M.D.   On: 02/18/2023 05:24        Scheduled Meds:  apixaban  5 mg Oral QODAY   aspirin  81 mg Oral Daily   atorvastatin  10 mg Oral Daily   Chlorhexidine Gluconate Cloth  6 each Topical Daily   DULoxetine  90 mg Oral Daily   linaclotide  290 mcg Oral QAC breakfast   pantoprazole  40 mg Oral Daily   polyethylene glycol  17 g Oral Daily   senna-docusate  1 tablet Oral BID   traZODone  50 mg Oral QHS   vancomycin variable dose per unstable renal function (pharmacist dosing)   Does not apply See admin instructions   Continuous Infusions:  cefTRIAXone (ROCEPHIN)  IV Stopped (02/18/23 1458)   lactated ringers 125 mL/hr at 02/19/23 0426   vancomycin 1,000 mg (02/19/23 1032)     LOS: 1 day      Tresa Moore, MD [facility]   If 7PM-7AM, please contact night-coverage  02/19/2023, 11:24 AM

## 2023-02-19 NOTE — Plan of Care (Signed)
  Problem: Education: Goal: Knowledge of General Education information will improve Description: Including pain rating scale, medication(s)/side effects and non-pharmacologic comfort measures Outcome: Progressing   Problem: Health Behavior/Discharge Planning: Goal: Ability to manage health-related needs will improve Outcome: Progressing   Problem: Clinical Measurements: Goal: Ability to maintain clinical measurements within normal limits will improve Outcome: Progressing Goal: Diagnostic test results will improve Outcome: Progressing Goal: Cardiovascular complication will be avoided Outcome: Progressing   Problem: Nutrition: Goal: Adequate nutrition will be maintained Outcome: Progressing   

## 2023-02-19 NOTE — Consult Note (Signed)
Pharmacy Antibiotic Note  Wendy Nelson is a 57 y.o. female admitted on 02/18/2023 with sepsis.  Pharmacy has been consulted for vancomycin dosing. Pt spiked fevers during admission Tmax 103.9. Currently afeb. Hx of pyelo. Hx of PSA, e.coli, and enterococcus in the urine. Unsure if urine is the source.   Plan: Pt received vancomycin 2500 mg x 1 loading dose. Will start vancomycin 1000 mg q12H. Predicted AUC of 522. Goal AUC of 400-600. Scr 0.92, IBW, Vd 0.5. Plan to order vancomycin level after 4th or 5th dose.   Continue ceftriaxone 2 g daily.   Height: 5\' 5"  (165.1 cm) Weight: (!) 136.8 kg (301 lb 9.4 oz) IBW/kg (Calculated) : 57  Temp (24hrs), Avg:98.4 F (36.9 C), Min:98.1 F (36.7 C), Max:99.1 F (37.3 C)  Recent Labs  Lab 02/18/23 0457 02/18/23 0723 02/19/23 0629  WBC 9.3  --  8.3  CREATININE 1.38*  --  0.92  LATICACIDVEN 1.1 0.9  --     Estimated Creatinine Clearance: 97 mL/min (by C-G formula based on SCr of 0.92 mg/dL).    Allergies  Allergen Reactions   Melatonin    Misc. Sulfonamide Containing Compounds    Neomycin Other (See Comments)    Positive patch test   Sulfa Antibiotics Itching    Antimicrobials this admission: 8/6 vancomycin >>  8/6 ceftriaxone >>   Dose adjustments this admission: None.   Microbiology results: 8/5 BCx: pending 8/5 UCx:      Abnormal  >=100,000 COLONIES/mL ENTEROCOCCUS FAECALIS   8/5 MRSA PCR: negative.   Thank you for allowing pharmacy to be a part of this patient's care.  Ronnald Ramp, PharmD, BCPS 02/19/2023 11:16 AM

## 2023-02-19 NOTE — Plan of Care (Signed)

## 2023-02-20 DIAGNOSIS — A419 Sepsis, unspecified organism: Secondary | ICD-10-CM | POA: Diagnosis not present

## 2023-02-20 LAB — GLUCOSE, CAPILLARY
Glucose-Capillary: 96 mg/dL (ref 70–99)
Glucose-Capillary: 85 mg/dL (ref 70–99)
Glucose-Capillary: 88 mg/dL (ref 70–99)
Glucose-Capillary: 97 mg/dL (ref 70–99)

## 2023-02-20 MED ORDER — SODIUM CHLORIDE 0.9 % IV SOLN
1.0000 g | Freq: Four times a day (QID) | INTRAVENOUS | Status: DC
  Filled 2023-02-20 (×2): qty 1000

## 2023-02-20 MED ORDER — SODIUM CHLORIDE 0.9 % IV SOLN
2.0000 g | Freq: Four times a day (QID) | INTRAVENOUS | Status: DC
  Administered 2023-02-20 – 2023-02-21 (×5): 2 g via INTRAVENOUS
  Filled 2023-02-20 (×6): qty 2000

## 2023-02-20 NOTE — Progress Notes (Signed)
PROGRESS NOTE    Wendy Nelson  YQM:[id number] DOB: [birth year] DOA: 02/18/2023 PCP: [facility]    Brief Narrative:  Wendy Nelson is a 57 y.o. female with medical history significant of morbid obesity, quadriplegia, history of CVA, history of CAD, type 2 diabetes, hypertension presenting with sepsis, pyelonephritis.  Patient noted to be living at a local facility.  Patient noted to be hypotensive, hypoxic with fever and weakness at facility.  Patient reports generalized malaise.  No reported chest pain, shortness of breath.  No reported nausea or vomiting.  Patient overall fatigued.  Patient admitted June 2024 with similar issues with notable complicated UTI with culture showing E. coli and Enterococcus both pansensitive.  Was placed on course of amoxicillin for treatment at discharge. Presented to ER Tmax of 103.9, heart rate 100s, blood pressure 90s to 100s over 50s to 60s.  Satting in the mid 80s on room air.  Transition to 2 L nasal cannula to keep O2 sats greater than 92%.   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 Coli Repeat urine culture with Enterococcus Plan: DC Rocephin and vancomycin Start ampicillin for enterococcal coverage Monitor overnight, anticipate de-escalation to oral  regimen in a.m. and discharge 8/8   Acute pyelonephritis Noted sepsis on presentation with temperature 103, heart rate 100s with urinalysis indicative of infection and CT imaging showing right-sided pyelonephritis.   Chronic indwelling Foley associated with paraplegia this is a confounding issue Urine cultures June 2024 with noted E. 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  Suspect supply/demand ischemia No active chest pain Continue home regimen   HLD (hyperlipidemia) Statin   HTN (hypertension) Soft blood pressures in the setting of active sepsis Hold blood pressure regimen for now Monitor blood pressure and restart as appropriate  Type 2 diabetes mellitus Hemoglobin A1c 7.3 on 6/27, reasonable control Okay to resume home metformin 500 mg twice daily Sliding scale with NovoLog Carb modified diet   DVT prophylaxis: Apixaban Code Status: Full Family Communication: None today Disposition Plan: Status is: Inpatient Remains inpatient appropriate because: Pyelonephritis on IV antibiotics   Level of care: Progressive  Consultants:  None  Procedures:  None  Antimicrobials: Ampicillin  Subjective: Seen and examined.   Feeling well overall.  No pain complaints  Objective: Vitals:   02/19/23 2043 02/20/23 0040 02/20/23 0357 02/20/23 0909  BP: (!) 144/85 (!) 140/84 (!) 140/88 (!) 143/88  Pulse: 89 81 91 77  Resp: 18 18 18 18   Temp: 98.3 F (36.8 C) 98.7 F (37.1 C) 98.1 F (36.7 C) 97.8 F (36.6 C)  TempSrc:      SpO2: 95% 93% 94% 97%  Weight:      Height:        Intake/Output Summary (Last 24 hours) at 02/20/2023 1137 Last data filed at 02/20/2023 0900 Gross per 24 hour  Intake --  Output 4025 ml  Net -4025 ml   Filed Weights   02/18/23 0443 02/19/23 0500  Weight: 136.1 kg (!) 136.8 kg    Examination:  General exam: NAD Respiratory system: Lungs clear.  Normal work of breathing.  Room air Cardiovascular system: S1-S2, RRR, no murmurs, no pedal edema Gastrointestinal system: Soft, NT/ND, normal bowel sounds Central nervous system: Alert and oriented. No focal neurological deficits. Extremities: Lower extremity paraplegia Skin: No rashes, lesions or ulcers Psychiatry: Judgement and insight appear normal. Mood & affect appropriate.     Data Reviewed: I have personally reviewed following labs and imaging studies  CBC: Recent Labs  Lab 02/18/23 0457 02/19/23 0629  WBC 9.3 8.3  NEUTROABS 4.8  --   HGB 11.6* 12.1  HCT 35.8* 36.6  MCV 84.8 84.7  PLT 291 304   Basic Metabolic Panel: Recent Labs  Lab 02/18/23 0457 02/19/23 0629  NA 137 139  K 4.2 3.8  CL 99 103  CO2 28 24  GLUCOSE 89 100*  BUN 17 12  CREATININE 1.38* 0.92  CALCIUM 8.0* 8.4*   GFR: Estimated Creatinine Clearance: 97 mL/min (by C-G formula based on SCr of 0.92 mg/dL). Liver Function Tests: Recent Labs  Lab 02/18/23 0457 02/19/23 0629  AST 24 23  ALT 27 29  ALKPHOS 99 102  BILITOT 0.5 0.8  PROT 6.9 7.3  ALBUMIN 2.9* 3.0*   No results for input(s): "LIPASE", "AMYLASE" in the last 168 hours. No results for input(s): "AMMONIA" in the last 168 hours. Coagulation Profile: Recent Labs  Lab  02/18/23 0457  INR 1.3*   Cardiac Enzymes: No results for input(s): "CKTOTAL", "CKMB", "CKMBINDEX", "TROPONINI" in the last 168 hours. BNP (last 3 results) No results for input(s): "PROBNP" in the last 8760 hours. HbA1C: No results for input(s): "HGBA1C" in the last 72 hours. CBG: Recent Labs  Lab 02/19/23 1241 02/19/23 1623 02/19/23 2100 02/20/23 0911  GLUCAP 121* 104* 88 96   Lipid Profile: No results for input(s): "CHOL", "HDL", "LDLCALC", "TRIG", "CHOLHDL", "LDLDIRECT" in the last 72 hours. Thyroid Function Tests: No results for input(s): "TSH", "T4TOTAL", "FREET4", "T3FREE", "THYROIDAB" in the last 72 hours. Anemia Panel: No results for input(s): "VITAMINB12", "FOLATE", "FERRITIN", "TIBC", "IRON", "RETICCTPCT" in the last 72 hours. Sepsis Labs: Recent Labs  Lab 02/18/23 0457 02/18/23 0723  PROCALCITON <0.10  --   LATICACIDVEN 1.1 0.9    Recent Results (from the past 240 hour(s))  SARS Coronavirus 2 by RT PCR (hospital order, performed in [facility] hospital lab) *cepheid single result test* Anterior Nasal Swab     Status: None   Collection Time: 02/18/23  4:57 AM   Specimen: Anterior Nasal Swab  Result Value Ref Range Status   SARS Coronavirus 2 by RT PCR NEGATIVE NEGATIVE Final    Comment: (NOTE) SARS-CoV-2 target nucleic acids  are NOT DETECTED.  The SARS-CoV-2 RNA is generally detectable in upper and lower respiratory specimens during the acute phase of infection. The lowest concentration of SARS-CoV-2 viral copies this assay can detect is 250 copies / mL. A negative result does not preclude SARS-CoV-2 infection and should not be used as the sole basis for treatment or other patient management decisions.  A negative result may occur with improper specimen collection / handling, submission of specimen other than nasopharyngeal swab, presence of viral mutation(s) within the areas targeted by this assay, and inadequate number of viral copies (<250 copies /  mL). A negative result must be combined with clinical observations, patient history, and epidemiological information.  Fact Sheet for Patients:   RoadLapTop.co.za  Fact Sheet for Healthcare Providers: http://kim-miller.com/  This test is not yet approved or  cleared by the [place] FDA and has been authorized for detection and/or diagnosis of SARS-CoV-2 by FDA under an Emergency Use Authorization (EUA).  This EUA will remain in effect (meaning this test can be used) for the duration of the COVID-19 declaration under Section 564(b)(1) of the Act, 21 U.S.C. section 360bbb-3(b)(1), unless the authorization is terminated or revoked sooner.  Performed at [facility], [address] [place], [place] [address]   Urine Culture     Status: Abnormal   Collection Time: 02/18/23  4:57 AM   Specimen: Urine, Random  Result Value Ref Range Status   Specimen Description   Final    URINE, RANDOM Performed at [facility], [address] [place], [place] [address]    Special Requests   Final    NONE Reflexed from [id number] Performed at [facility], [address] [place], [place] [address]    Culture >=100,000 COLONIES/mL ENTEROCOCCUS FAECALIS (A)  Final   Report Status 02/20/2023 FINAL  Final   Organism ID, Bacteria ENTEROCOCCUS FAECALIS (A)  Final      Susceptibility   Enterococcus faecalis - MIC*    AMPICILLIN <=2 SENSITIVE Sensitive     NITROFURANTOIN <=16 SENSITIVE Sensitive     VANCOMYCIN 1 SENSITIVE Sensitive     * >=100,000 COLONIES/mL ENTEROCOCCUS FAECALIS  Blood Culture (routine x 2)     Status: None (Preliminary result)   Collection Time: 02/18/23  4:58 AM   Specimen: BLOOD RIGHT ARM  Result Value Ref Range Status   Specimen Description BLOOD RIGHT ARM  Final   Special Requests   Final    BOTTLES DRAWN AEROBIC AND ANAEROBIC Blood Culture results may not be optimal due to an excessive volume of  blood received in culture bottles   Culture   Final    NO GROWTH 2 DAYS Performed at [facility], [address] [place], [place] [address]    Report Status PENDING  Incomplete  Blood Culture (routine x 2)     Status: None (Preliminary result)   Collection Time: 02/18/23  4:58 AM   Specimen: BLOOD RIGHT ARM  Result Value Ref Range Status   Specimen Description BLOOD RIGHT ARM  Final   Special Requests Blood Culture adequate volume  Final   Culture   Final    NO GROWTH 2 DAYS Performed at [facility], [address] [place], [place] [address]    Report Status PENDING  Incomplete  MRSA Next Gen by PCR, Nasal     Status: None   Collection Time: 02/18/23  7:15 PM   Specimen: Nasal Mucosa; Nasal Swab  Result Value Ref Range Status  MRSA by PCR Next Gen NOT DETECTED NOT DETECTED Final    Comment: (NOTE) The GeneXpert MRSA Assay (FDA approved for NASAL specimens only), is one component of a comprehensive MRSA colonization surveillance program. It is not intended to diagnose MRSA infection nor to guide or monitor treatment for MRSA infections. Test performance is not FDA approved in patients less than 48 years old. Performed at [facility], [address] [place], [place] [address]          Radiology Studies: No results found.      Scheduled Meds:  apixaban  5 mg Oral QODAY   aspirin  81 mg Oral Daily   atorvastatin  10 mg Oral Daily   Chlorhexidine Gluconate Cloth  6 each Topical Daily   dextromethorphan-guaiFENesin  1 tablet Oral Q12H   DULoxetine  60 mg Oral Daily   fluticasone  1 spray Each Nare Daily   insulin aspart  0-15 Units Subcutaneous TID WC   insulin aspart  0-5 Units Subcutaneous QHS   lidocaine  1 Application Topical Daily   linaclotide  290 mcg Oral QAC breakfast   metFORMIN  500 mg Oral BID WC   pantoprazole  40 mg Oral Daily   polyethylene glycol  17 g Oral Daily   polyvinyl alcohol  2 drop Both Eyes BID    prednisoLONE acetate  1 drop Left Eye Daily   pregabalin  150 mg Oral BID   senna-docusate  1 tablet Oral BID   traZODone  50 mg Oral QHS   Continuous Infusions:  ampicillin (OMNIPEN) IV     lactated ringers 125 mL/hr at 02/20/23 0622     LOS: 2 days      Tresa Moore, MD [facility]   If 7PM-7AM, please contact night-coverage  02/20/2023, 11:37 AM

## 2023-02-21 DIAGNOSIS — A419 Sepsis, unspecified organism: Secondary | ICD-10-CM | POA: Diagnosis not present

## 2023-02-21 LAB — GLUCOSE, CAPILLARY
Glucose-Capillary: 75 mg/dL (ref 70–99)
Glucose-Capillary: 99 mg/dL (ref 70–99)

## 2023-02-21 MED ORDER — AMOXICILLIN 500 MG PO CAPS: 1000.0000 mg | ORAL_CAPSULE | Freq: Three times a day (TID) | ORAL | Status: AC

## 2023-02-21 NOTE — TOC Transition Note (Addendum)
Transition of Care [facility]) - CM/SW Discharge Note   Patient Details  Name: Wendy Nelson MRN: [id number] Date of Birth: 1965-07-25  Transition of Care [facility]) CM/SW Contact:  Truddie Hidden, RN Phone Number: 02/21/2023, 12:50 PM   Clinical Narrative:    Spoke with Tammy in admissions at [facility]. Per facility patient admission confirmed for today. Patient assigned room # 209  Nurse will call report to [phone] Nurse, and family notified spoke with her mother  Face sheet and medical necessity forms printed to the floor to be added to the EMS pack EMS arranged  Discharge summary and SNF transfer report sent in HUB.  TOC signing off.          Patient Goals and CMS Choice      Discharge Placement                         Discharge Plan and Services Additional resources added to the After Visit Summary for                                       Social Determinants of Health (SDOH) Interventions SDOH Screenings   Food Insecurity: No Food Insecurity (02/18/2023)  Housing: Low Risk  (02/18/2023)  Transportation Needs: No Transportation Needs (02/18/2023)  Utilities: Not At Risk (02/18/2023)  Financial Resource Strain: Low Risk  (01/20/2023)   Received from [facility]  Tobacco Use: Low Risk  (02/18/2023)  Recent Concern: Tobacco Use - Medium Risk (01/25/2023)   Received from [facility]     Readmission Risk Interventions     No data to display

## 2023-02-21 NOTE — Plan of Care (Signed)

## 2023-02-21 NOTE — Care Management Important Message (Signed)
Important Message  Patient Details  Name: Wendy Nelson MRN: [id number] Date of Birth: 04/10/1966   Medicare Important Message Given:  Yes  Attempted to reach patient via room phone [phone]) to review Medicare IM, no answer upon attempt.  Reviewed Medicare IM with Gilberto Better, mother, at [phone].  Copy of Medicare IM placed in mail to patient's address on file.     Johnell Comings 02/21/2023, 1:50 PM

## 2023-02-21 NOTE — NC FL2 (Signed)
[place] MEDICAID FL2 LEVEL OF CARE FORM     IDENTIFICATION  Patient Name: Wendy Nelson Birthdate: November 13, 1965 Sex: female Admission Date (Current Location): 02/18/2023  [facility] and [place] Number:  Chiropodist and Address:  [facility], [address] [place], [place] [address]      Provider Number: [id number]  Attending Physician Name and Address:  Tresa Moore, MD  Relative Name and Phone Number:  Forker,Louella (Mother)  [phone] [facility])    Current Level of Care: Hospital Recommended Level of Care: Skilled Nursing Facility Prior Approval Number:    Date Approved/Denied:   PASRR Number: [id number]  Discharge Plan: SNF    Current Diagnoses: Patient Active Problem List   Diagnosis Date Noted   CAD (coronary artery disease) 02/18/2023   Acute pyelonephritis 02/18/2023   Acute respiratory failure with hypoxia (HCC) 02/18/2023   NSTEMI (non-ST elevated myocardial infarction) (HCC) 01/08/2023   Morbid obesity with BMI of 50.0-59.9, adult (HCC) 01/08/2023   Chronic indwelling Foley catheter 01/08/2023   Headache 01/08/2023   Jaw pain 01/08/2023   Lactic acidosis 10/31/2021   Complicated UTI (urinary tract infection) 10/30/2021   SIRS (systemic inflammatory response syndrome), possible sepsis(HCC)    Quadriplegia, 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 09/22/21 (HCC)    Sepsis (HCC) 12/23/2020   Neurogenic bladder    Syncope 10/19/2020   AKI (acute kidney injury) (HCC) 10/19/2020   Bilateral hydronephrosis 10/19/2020   HTN (hypertension) 10/19/2020   HLD (hyperlipidemia) 10/19/2020   Depression with anxiety 10/19/2020   Hypotension 09/2020   Cervical spondylosis with myelopathy and radiculopathy 09/2020   Spinal cord infarction (HCC) 09/2020    Orientation RESPIRATION BLADDER Height & Weight        Normal External catheter, Incontinent Weight: (!) 136.8 kg Height:  5'  5" (165.1 cm)  BEHAVIORAL SYMPTOMS/MOOD NEUROLOGICAL BOWEL NUTRITION STATUS  Other (Comment) (n/a)  (n/a) Incontinent Diet (Heart Health)  AMBULATORY STATUS COMMUNICATION OF NEEDS Skin   Extensive Assist Verbally Other (Comment) (n/a)                       Personal Care Assistance Level of Assistance  Total care       Total Care Assistance: Maximum assistance   Functional Limitations Info             SPECIAL CARE FACTORS FREQUENCY                       Contractures Contractures Info: Not present    Additional Factors Info  Code Status, Allergies Code Status Info: FULL Allergies Info: Melatonin, Misc. Sulfonamide Containing Compounds, Neomycin, Sulfa Antibiotics           Current Medications (02/21/2023):  This is the current hospital active medication list Current Facility-Administered Medications  Medication Dose Route Frequency Provider Last Rate Last Admin   acetaminophen (TYLENOL) tablet 650 mg  650 mg Oral Q6H PRN Andris Baumann, MD       Or   acetaminophen (TYLENOL) suppository 650 mg  650 mg Rectal Q6H PRN Andris Baumann, MD       ampicillin (OMNIPEN) 2 g in sodium chloride 0.9 % 100 mL IVPB  2 g Intravenous Q6H Sreenath, Sudheer B, MD 300 mL/hr at 02/21/23 0615 2 g at 02/21/23 0615   apixaban (ELIQUIS) tablet 5 mg  5 mg Oral Carlean Jews, MD   5 mg at 02/19/23  4259   aspirin chewable tablet 81 mg  81 mg Oral Daily Floydene Flock, MD   81 mg at 02/20/23 5638   atorvastatin (LIPITOR) tablet 10 mg  10 mg Oral Daily Floydene Flock, MD   10 mg at 02/20/23 0944   bisacodyl (DULCOLAX) suppository 10 mg  10 mg Rectal Daily PRN Floydene Flock, MD       Chlorhexidine Gluconate Cloth 2 % PADS 6 each  6 each Topical Daily Floydene Flock, MD   6 each at 02/20/23 0944   dextromethorphan-guaiFENesin (MUCINEX DM) 30-600 MG per 12 hr tablet 1 tablet  1 tablet Oral Q12H Lolita Patella B, MD   1 tablet at 02/20/23 2100   DULoxetine (CYMBALTA) DR  capsule 60 mg  60 mg Oral Daily Barrie Folk, RPH   60 mg at 02/20/23 0943   fluticasone (FLONASE) 50 MCG/ACT nasal spray 1 spray  1 spray Each Nare Daily Sreenath, Sudheer B, MD       insulin aspart (novoLOG) injection 0-15 Units  0-15 Units Subcutaneous TID WC Sreenath, Sudheer B, MD       insulin aspart (novoLOG) injection 0-5 Units  0-5 Units Subcutaneous QHS Georgeann Oppenheim, Sudheer B, MD       lactated ringers infusion   Intravenous Continuous Floydene Flock, MD 125 mL/hr at 02/21/23 0340 New Bag at 02/21/23 0340   lidocaine (LMX) 4 % cream 1 Application  1 Application Topical Daily Sreenath, Sudheer B, MD       linaclotide (LINZESS) capsule 290 mcg  290 mcg Oral QAC breakfast Barrie Folk, RPH   290 mcg at 02/19/23 1034   meclizine (ANTIVERT) tablet 25 mg  25 mg Oral Daily PRN Lolita Patella B, MD       metFORMIN (GLUCOPHAGE) tablet 500 mg  500 mg Oral BID WC Sreenath, Sudheer B, MD   500 mg at 02/20/23 1752   ondansetron (ZOFRAN) tablet 4 mg  4 mg Oral Q6H PRN Andris Baumann, MD       Or   ondansetron [facility]) injection 4 mg  4 mg Intravenous Q6H PRN Andris Baumann, MD   4 mg at 02/21/23 0956   pantoprazole (PROTONIX) EC tablet 40 mg  40 mg Oral Daily Floydene Flock, MD   40 mg at 02/20/23 0944   polyethylene glycol (MIRALAX / GLYCOLAX) packet 17 g  17 g Oral Daily Floydene Flock, MD   17 g at 02/20/23 7564   polyvinyl alcohol (LIQUIFILM TEARS) 1.4 % ophthalmic solution 2 drop  2 drop Both Eyes BID Lolita Patella B, MD   2 drop at 02/20/23 2137   prednisoLONE acetate (PRED FORTE) 1 % ophthalmic suspension 1 drop  1 drop Left Eye Daily Lolita Patella B, MD   1 drop at 02/20/23 0948   pregabalin (LYRICA) capsule 150 mg  150 mg Oral BID Lolita Patella B, MD   150 mg at 02/20/23 2100   senna-docusate (Senokot-S) tablet 1 tablet  1 tablet Oral BID Floydene Flock, MD   1 tablet at 02/20/23 2101   traZODone (DESYREL) tablet 50 mg  50 mg Oral QHS Floydene Flock, MD    50 mg at 02/20/23 2100     Discharge Medications: Please see discharge summary for a list of discharge medications.  Relevant Imaging Results:  Relevant Lab Results:   Additional Information SS#: [id number]  Truddie Hidden, RN

## 2023-02-21 NOTE — Discharge Summary (Signed)
Physician Discharge Summary  Wendy Nelson ZOX:[id number] DOB: 01-Jan-1966 DOA: 02/18/2023  PCP: [facility]  Admit date: 02/18/2023 Discharge date: 02/21/2023  Admitted From: SNF Disposition:  SNF ([facility])  Recommendations for Outpatient Follow-up:  Follow up with PCP in 1-2 weeks   Home Health:No Equipment/Devices:None   Discharge Condition:Stable  CODE STATUS:FULL  Diet recommendation: Heart healthy/carb modified  Brief/Interim Summary:  Wendy Nelson is a 57 y.o. female with medical history significant of morbid obesity, quadriplegia, history of CVA, history of CAD, type 2 diabetes, hypertension presenting with sepsis, pyelonephritis.  Patient noted to be living at a local facility.  Patient noted to be hypotensive, hypoxic with fever and weakness at facility.  Patient reports generalized malaise.  No reported chest pain, shortness of breath.  No reported nausea or vomiting.  Patient overall fatigued.  Patient admitted June 2024 with similar issues with notable complicated UTI with culture showing E. coli and Enterococcus both pansensitive.  Was placed on course of amoxicillin for treatment at discharge. Presented to ER Tmax of 103.9, heart rate 100s, blood pressure 90s to 100s over 50s to 60s.  Satting in the mid 80s on room air.  Transition to 2 L nasal cannula to keep O2 sats greater than 92%.  Improved clinically during course of hospitalization.  Initially on Rocephin and vancomycin.  Urine culture positive for greater than 100,000 colony-forming units of Enterococcus faecalis.  Switched to ampicillin per sensitivities.  Did well on ampicillin.  De-escalated further to p.o. amoxicillin 1000 mg 3 times daily for additional 7 days.  Complete total 10-day antibiotic course.  Blood cultures remain negative.    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 Coli Repeat urine culture with Enterococcus Plan: Stable for discharge.  Discontinue ampicillin.  Start amoxicillin p.o. 1 g 3 times daily for additional 7 days   Acute pyelonephritis Noted sepsis on presentation with temperature 103, heart rate 100s with urinalysis indicative of infection and CT imaging showing right-sided pyelonephritis.   Chronic indwelling Foley associated with paraplegia this is a confounding issue Urine cultures June 2024 with noted E. coli Enterococcus pansensitive Started on IV cefepime, Flagyl and vancomycin in the setting of sepsis workup Plan: P.o. 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  Suspect supply/demand ischemia No active chest pain Continue home regimen   HLD (hyperlipidemia) Statin   HTN (  hypertension) Blood  pressures improved.  Can resume home regimen   Type 2 diabetes mellitus Can resume home regimen  Discharge Instructions  Discharge Instructions     Diet - low sodium heart healthy   Complete by: As directed    Increase activity slowly   Complete by: As directed       Allergies as of 02/21/2023       Reactions   Melatonin    Misc. Sulfonamide Containing Compounds    Neomycin Other (See Comments)   Positive patch test   Sulfa Antibiotics Itching        Medication List     STOP taking these medications    ondansetron 4 MG tablet Commonly known as: ZOFRAN   oxyCODONE 5 MG immediate release tablet Commonly known as: Oxy IR/ROXICODONE       TAKE these medications    Acetaminophen 500 MG capsule Take 1,000 mg by mouth every 6 (six) hours as needed.   amoxicillin 500 MG capsule Commonly known as: AMOXIL Take 2 capsules (1,000 mg total) by mouth 3 (three) times daily for 7 days.   aspirin 81 MG chewable tablet Chew 81 mg by mouth daily.   atorvastatin 10 MG tablet Commonly known as: LIPITOR Take 10 mg by mouth daily.   benzonatate 100 MG capsule Commonly known as: TESSALON Take 100 mg by mouth daily as needed for cough.   bisacodyl 10 MG suppository Commonly known as: DULCOLAX Place 1 suppository (10 mg total) rectally daily as needed for moderate constipation.   CRANBERRY PO Take by mouth daily.   D-Mannose 500 MG Caps Take by mouth daily. AZO   DermaPhor Oint Apply 1 application. topically daily. To feet.   dextromethorphan-guaiFENesin 30-600 MG 12hr tablet Commonly known as: MUCINEX DM Take 1 tablet by mouth every 12 (twelve) hours.   DULoxetine 60 MG capsule Commonly known as: CYMBALTA Take 60 mg by mouth daily.   DULoxetine 30 MG capsule Commonly known as: CYMBALTA Take 30 mg by mouth daily.   Eliquis 5 MG Tabs tablet Generic drug: apixaban Take 5 mg by mouth every other day.   Ergocalciferol 50 MCG (2000 UT) Tabs Take 2 tablets by  mouth daily.   fluticasone 50 MCG/ACT nasal spray Commonly known as: FLONASE Place 1 spray into both nostrils daily.   lidocaine 4 % cream Commonly known as: LMX Apply 1 Application topically daily. Apply to left shoulder.   Linzess 290 MCG Caps capsule Generic drug: linaclotide Take 290 mcg by mouth daily before breakfast.   meclizine 25 MG tablet Commonly known as: ANTIVERT Take 25 mg by mouth daily as needed.   metFORMIN 500 MG tablet Commonly known as: GLUCOPHAGE Take 500 mg by mouth 2 (two) times daily with a meal.   multivitamin tablet Take 1 tablet by mouth daily.   naloxone 4 MG/0.1ML Liqd nasal spray kit Commonly known as: NARCAN Place 1 spray into the nose once.   omeprazole 40 MG capsule Commonly known as: PRILOSEC Take 40 mg by mouth daily.   ondansetron 4 MG disintegrating tablet Commonly known as: ZOFRAN-ODT Take 4 mg by mouth every 8 (eight) hours as needed for vomiting or nausea.   Oral Relief For Dry Mouth Lozg by Transmucosal route.   polyethylene glycol powder 17 GM/SCOOP powder Commonly known as: GLYCOLAX/MIRALAX Take 1 Container by mouth daily.   prednisoLONE acetate 1 % ophthalmic suspension Commonly known as: PRED FORTE Place 1 drop into the left eye daily.   pregabalin 150  MG capsule Commonly known as: LYRICA Take 150 mg by mouth 2 (two) times daily.   promethazine 12.5 MG tablet Commonly known as: PHENERGAN Take 12.5 mg by mouth every 6 (six) hours as needed for vomiting or nausea.   senna-docusate 8.6-50 MG tablet Commonly known as: Senokot-S Take 1 tablet by mouth 2 (two) times daily.   traZODone 50 MG tablet Commonly known as: DESYREL Take 50 mg by mouth at bedtime.   Visine Dry Eye Relief 1 % Soln Generic drug: Polyethylene Glycol 400 Place 2 drops into both eyes 2 (two) times daily.        Contact information for after-discharge care     Destination     [facility] Preferred SNF .    Service: Skilled Nursing Contact information: [address] [place] [address] [phone]                    Allergies  Allergen Reactions   Melatonin    Misc. Sulfonamide Containing Compounds    Neomycin Other (See Comments)    Positive patch test   Sulfa Antibiotics Itching    Consultations: None   Procedures/Studies: CT Angio Chest PE W and/or Wo Contrast  Result Date: 02/18/2023 CLINICAL DATA:  57 year old paraplegic female with history of hypotension, hypoxia, fever, weakness and possible sepsis. EXAM: CT ANGIOGRAPHY CHEST CT ABDOMEN AND PELVIS WITH CONTRAST TECHNIQUE: Multidetector CT imaging of the chest was performed using the standard protocol during bolus administration of intravenous contrast. Multiplanar CT image reconstructions and MIPs were obtained to evaluate the vascular anatomy. Multidetector CT imaging of the abdomen and pelvis was performed using the standard protocol during bolus administration of intravenous contrast. RADIATION DOSE REDUCTION: This exam was performed according to the departmental dose-optimization program which includes automated exposure control, adjustment of the mA and/or kV according to patient size and/or use of iterative reconstruction technique. CONTRAST:  OMNIPAQUE IOHEXOL 350 MG/ML SOLN COMPARISON:  CTA of the chest, abdomen and pelvis 01/08/2023. FINDINGS: CTA CHEST FINDINGS Cardiovascular: There are no filling defects within the pulmonary arterial tree to suggest pulmonary embolism. Heart size is enlarged. There is no significant pericardial fluid, thickening or pericardial calcification. Mild dilatation of the pulmonic trunk (3.5 cm in diameter), suggestive of pulmonary arterial hypertension. Mediastinum/Nodes: No pathologically enlarged mediastinal or hilar lymph nodes. Esophagus is unremarkable in appearance. No axillary lymphadenopathy. Lungs/Pleura: Scattered areas of architectural distortion in the left  lung are most compatible with areas of subsegmental atelectasis and/or scarring. No confluent consolidative airspace disease. No pleural effusions. No definite suspicious appearing pulmonary nodules or masses are noted. Musculoskeletal: Orthopedic fixation hardware in the lower cervical spine partially imaged. There are no aggressive appearing lytic or blastic lesions noted in the visualized portions of the skeleton. Review of the MIP images confirms the above findings. CT ABDOMEN and PELVIS FINDINGS Hepatobiliary: No suspicious cystic or solid hepatic lesions. No intra or extrahepatic biliary ductal dilatation. Gallbladder is unremarkable in appearance. Pancreas: No pancreatic mass. No pancreatic ductal dilatation. No pancreatic or peripancreatic fluid collections or inflammatory changes. Spleen: Unremarkable. Adrenals/Urinary Tract: Left kidney and bilateral adrenal glands are normal in appearance. Mild right hydroureteronephrosis with mild diffuse urothelial enhancement and thickening. Heterogeneous hypoperfusion of the right kidney, most evident during delayed imaging where there is slightly decreased excretion of contrast material by the right kidney. Urinary bladder is nearly completely decompressed around an indwelling Foley balloon catheter. Notably, the tip of the catheter is in close proximity to  the right ureterovesicular junction. Small amount of gas non dependently within the lumen of the urinary bladder. Stomach/Bowel: The appearance of the stomach is normal. No pathologic dilatation of small bowel or colon. Normal appendix. Vascular/Lymphatic: Atherosclerosis in the abdominal and pelvic vasculature, without evidence of aneurysm or dissection. No lymphadenopathy noted in the abdomen or pelvis. Reproductive: Uterus and ovaries are unremarkable in appearance. Other: No significant volume of ascites.  No pneumoperitoneum. Musculoskeletal: There are no aggressive appearing lytic or blastic lesions noted in  the visualized portions of the skeleton. Review of the MIP images confirms the above findings. IMPRESSION: 1. No evidence of pulmonary embolism. 2. Mild right-sided hydroureteronephrosis with diffuse right-sided urothelial enhancement, and heterogeneous hypoperfusion of the right kidney. Findings are concerning for probable right-sided pyelonephritis. Correlation with urinalysis is recommended. No obstructing stone identified along the course of the right ureter. The Foley balloon catheter tip is closely approximated to the right ureterovesicular junction, which could be causing some local mass effect upon the right UVJ resulting in mild obstruction. 3. Dilatation of the pulmonic trunk (3.5 cm in diameter) which may suggest pulmonary arterial hypertension. 4. Aortic atherosclerosis. Electronically Signed   By: Trudie Reed M.D.   On: 02/18/2023 06:28   CT ABDOMEN PELVIS W CONTRAST  Result Date: 02/18/2023 CLINICAL DATA:  57 year old paraplegic female with history of hypotension, hypoxia, fever, weakness and possible sepsis. EXAM: CT ANGIOGRAPHY CHEST CT ABDOMEN AND PELVIS WITH CONTRAST TECHNIQUE: Multidetector CT imaging of the chest was performed using the standard protocol during bolus administration of intravenous contrast. Multiplanar CT image reconstructions and MIPs were obtained to evaluate the vascular anatomy. Multidetector CT imaging of the abdomen and pelvis was performed using the standard protocol during bolus administration of intravenous contrast. RADIATION DOSE REDUCTION: This exam was performed according to the departmental dose-optimization program which includes automated exposure control, adjustment of the mA and/or kV according to patient size and/or use of iterative reconstruction technique. CONTRAST:  OMNIPAQUE IOHEXOL 350 MG/ML SOLN COMPARISON:  CTA of the chest, abdomen and pelvis 01/08/2023. FINDINGS: CTA CHEST FINDINGS Cardiovascular: There are no filling defects within the  pulmonary arterial tree to suggest pulmonary embolism. Heart size is enlarged. There is no significant pericardial fluid, thickening or pericardial calcification. Mild dilatation of the pulmonic trunk (3.5 cm in diameter), suggestive of pulmonary arterial hypertension. Mediastinum/Nodes: No pathologically enlarged mediastinal or hilar lymph nodes. Esophagus is unremarkable in appearance. No axillary lymphadenopathy. Lungs/Pleura: Scattered areas of architectural distortion in the left lung are most compatible with areas of subsegmental atelectasis and/or scarring. No confluent consolidative airspace disease. No pleural effusions. No definite suspicious appearing pulmonary nodules or masses are noted. Musculoskeletal: Orthopedic fixation hardware in the lower cervical spine partially imaged. There are no aggressive appearing lytic or blastic lesions noted in the visualized portions of the skeleton. Review of the MIP images confirms the above findings. CT ABDOMEN and PELVIS FINDINGS Hepatobiliary: No suspicious cystic or solid hepatic lesions. No intra or extrahepatic biliary ductal dilatation. Gallbladder is unremarkable in appearance. Pancreas: No pancreatic mass. No pancreatic ductal dilatation. No pancreatic or peripancreatic fluid collections or inflammatory changes. Spleen: Unremarkable. Adrenals/Urinary Tract: Left kidney and bilateral adrenal glands are normal in appearance. Mild right hydroureteronephrosis with mild diffuse urothelial enhancement and thickening. Heterogeneous hypoperfusion of the right kidney, most evident during delayed imaging where there is slightly decreased excretion of contrast material by the right kidney. Urinary bladder is nearly completely decompressed around an indwelling Foley balloon catheter. Notably, the tip of the catheter  is in close proximity to the right ureterovesicular junction. Small amount of gas non dependently within the lumen of the urinary bladder. Stomach/Bowel:  The appearance of the stomach is normal. No pathologic dilatation of small bowel or colon. Normal appendix. Vascular/Lymphatic: Atherosclerosis in the abdominal and pelvic vasculature, without evidence of aneurysm or dissection. No lymphadenopathy noted in the abdomen or pelvis. Reproductive: Uterus and ovaries are unremarkable in appearance. Other: No significant volume of ascites.  No pneumoperitoneum. Musculoskeletal: There are no aggressive appearing lytic or blastic lesions noted in the visualized portions of the skeleton. Review of the MIP images confirms the above findings. IMPRESSION: 1. No evidence of pulmonary embolism. 2. Mild right-sided hydroureteronephrosis with diffuse right-sided urothelial enhancement, and heterogeneous hypoperfusion of the right kidney. Findings are concerning for probable right-sided pyelonephritis. Correlation with urinalysis is recommended. No obstructing stone identified along the course of the right ureter. The Foley balloon catheter tip is closely approximated to the right ureterovesicular junction, which could be causing some local mass effect upon the right UVJ resulting in mild obstruction. 3. Dilatation of the pulmonic trunk (3.5 cm in diameter) which may suggest pulmonary arterial hypertension. 4. Aortic atherosclerosis. Electronically Signed   By: Trudie Reed M.D.   On: 02/18/2023 06:28   DG Chest Port 1 View  Result Date: 02/18/2023 CLINICAL DATA:  57 year old female with sepsis. EXAM: PORTABLE CHEST 1 VIEW COMPARISON:  Chest x-ray 01/08/2023. FINDINGS: Lung volumes are low. Poorly defined bibasilar opacities which may reflect areas of atelectasis and/or consolidation. Small bilateral pleural effusions. There is cephalization of the pulmonary vasculature and slight indistinctness of the interstitial markings suggestive of mild pulmonary edema. Cardiomegaly. The patient is rotated to the right on today's exam, resulting in distortion of the mediastinal contours  and reduced diagnostic sensitivity and specificity for mediastinal pathology. Orthopedic fixation hardware in the lower cervical spine incidentally noted. IMPRESSION: 1. The appearance the chest suggests congestive heart failure, as above. 2. Bibasilar opacities which may reflect areas of subsegmental atelectasis and/or developing airspace consolidation. Electronically Signed   By: Trudie Reed M.D.   On: 02/18/2023 05:24      Subjective: Seen and examined the day of discharge.  Stable no distress.  Appropriate for [facility].  Discharge Exam: Vitals:   02/21/23 0430 02/21/23 0909  BP: 131/71 (!) 148/95  Pulse: 79 77  Resp: 18 19  Temp: 98 F (36.7 C)   SpO2: 93% 94%   Vitals:   02/20/23 1917 02/20/23 2320 02/21/23 0430 02/21/23 0909  BP: 110/64 (!) 150/78 131/71 (!) 148/95  Pulse: 85 73 79 77  Resp: 16 18 18 19   Temp: 97.7 F (36.5 C) (!) 97.5 F (36.4 C) 98 F (36.7 C)   TempSrc:      SpO2: 94% 95% 93% 94%  Weight:      Height:        General: Pt is alert, awake, not in acute distress Cardiovascular: RRR, S1/S2 +, no rubs, no gallops Respiratory: CTA bilaterally, no wheezing, no rhonchi Abdominal: Soft, NT, ND, bowel sounds + Extremities: no edema, no cyanosis    The results of significant diagnostics from this hospitalization (including imaging, microbiology, ancillary and laboratory) are listed below for reference.     Microbiology: Recent Results (from the past 240 hour(s))  SARS Coronavirus 2 by RT PCR (hospital order, performed in [facility] hospital lab) *cepheid single result test* Anterior Nasal Swab     Status: None   Collection Time: 02/18/23  4:57 AM   Specimen: Anterior Nasal Swab  Result Value Ref Range Status   SARS Coronavirus 2 by RT PCR NEGATIVE NEGATIVE Final    Comment: (NOTE) SARS-CoV-2 target nucleic acids are NOT DETECTED.  The SARS-CoV-2 RNA is generally detectable in upper and lower respiratory specimens during  the acute phase of infection. The lowest concentration of SARS-CoV-2 viral copies this assay can detect is 250 copies / mL. A negative result does not preclude SARS-CoV-2 infection and should not be used as the sole basis for treatment or other patient management decisions.  A negative result may occur with improper specimen collection / handling, submission of specimen other than nasopharyngeal swab, presence of viral mutation(s) within the areas targeted by this assay, and inadequate number of viral copies (<250 copies / mL). A negative result must be combined with clinical observations, patient history, and epidemiological information.  Fact Sheet for Patients:   RoadLapTop.co.za  Fact Sheet for Healthcare Providers: http://kim-miller.com/  This test is not yet approved or  cleared by the [place] FDA and has been authorized for detection and/or diagnosis of SARS-CoV-2 by FDA under an Emergency Use Authorization (EUA).  This EUA will remain in effect (meaning this test can be used) for the duration of the COVID-19 declaration under Section 564(b)(1) of the Act, 21 U.S.C. section 360bbb-3(b)(1), unless the authorization is terminated or revoked sooner.  Performed at [facility], [address] [place], [place] [address]   Urine Culture     Status: Abnormal   Collection Time: 02/18/23  4:57 AM   Specimen: Urine, Random  Result Value Ref Range Status   Specimen Description   Final    URINE, RANDOM Performed at [facility], [address] [place], [place] [address]    Special Requests   Final    NONE Reflexed from [id number] Performed at [facility], [address] [place], [place] [address]    Culture >=100,000 COLONIES/mL ENTEROCOCCUS FAECALIS (A)  Final   Report Status 02/20/2023 FINAL  Final   Organism ID, Bacteria ENTEROCOCCUS FAECALIS (A)  Final      Susceptibility   Enterococcus faecalis  - MIC*    AMPICILLIN <=2 SENSITIVE Sensitive     NITROFURANTOIN <=16 SENSITIVE Sensitive     VANCOMYCIN 1 SENSITIVE Sensitive     * >=100,000 COLONIES/mL ENTEROCOCCUS FAECALIS  Blood Culture (routine x 2)     Status: None (Preliminary result)   Collection Time: 02/18/23  4:58 AM   Specimen: BLOOD RIGHT ARM  Result Value Ref Range Status   Specimen Description BLOOD RIGHT ARM  Final   Special Requests   Final    BOTTLES DRAWN AEROBIC AND ANAEROBIC Blood Culture results may not be optimal due to an excessive volume of blood received in culture bottles   Culture   Final    NO GROWTH 3 DAYS Performed at [facility], [address], [place], [place] [address]    Report Status PENDING  Incomplete  Blood Culture (routine x 2)     Status: None (Preliminary result)   Collection Time: 02/18/23  4:58 AM   Specimen: BLOOD RIGHT ARM  Result Value Ref Range Status   Specimen Description BLOOD RIGHT ARM  Final   Special Requests Blood Culture adequate volume  Final   Culture   Final    NO GROWTH 3 DAYS Performed at [facility], [address], [place], [place] [address]    Report Status PENDING  Incomplete  MRSA Next Gen by PCR, Nasal     Status: None   Collection Time: 02/18/23  7:15 PM   Specimen: Nasal Mucosa; Nasal Swab  Result Value Ref Range Status   MRSA by PCR Next Gen NOT DETECTED NOT DETECTED Final    Comment: (NOTE) The GeneXpert MRSA Assay (FDA approved for NASAL specimens only), is one component of a comprehensive MRSA colonization surveillance program. It is not intended to diagnose MRSA infection nor to guide or monitor treatment for MRSA infections. Test performance is not FDA approved in patients less than 66 years old. Performed at [facility], [address] [place], [place] [address]      Labs: BNP (last 3 results) No results for input(s): "BNP" in the last 8760 hours. Basic Metabolic Panel: Recent Labs  Lab 02/18/23 0457  02/19/23 0629  NA 137 139  K 4.2 3.8  CL 99 103  CO2 28 24  GLUCOSE 89 100*  BUN 17 12  CREATININE 1.38* 0.92  CALCIUM 8.0* 8.4*   Liver Function Tests: Recent Labs  Lab 02/18/23 0457 02/19/23 0629  AST 24 23  ALT 27 29  ALKPHOS 99 102  BILITOT 0.5 0.8  PROT 6.9 7.3  ALBUMIN 2.9* 3.0*   No results for input(s): "LIPASE", "AMYLASE" in the last 168 hours. No results for input(s): "AMMONIA" in the last 168 hours. CBC: Recent Labs  Lab 02/18/23 0457 02/19/23 0629  WBC 9.3 8.3  NEUTROABS 4.8  --   HGB 11.6* 12.1  HCT 35.8* 36.6  MCV 84.8 84.7  PLT 291 304   Cardiac Enzymes: No results for input(s): "CKTOTAL", "CKMB", "CKMBINDEX", "TROPONINI" in the last 168 hours. BNP: Invalid input(s): "POCBNP" CBG: Recent Labs  Lab 02/20/23 0911 02/20/23 1247 02/20/23 1657 02/20/23 2040 02/21/23 0913  GLUCAP 96 85 88 97 75   D-Dimer No results for input(s): "DDIMER" in the last 72 hours. Hgb A1c No results for input(s): "HGBA1C" in the last 72 hours. Lipid Profile No results for input(s): "CHOL", "HDL", "LDLCALC", "TRIG", "CHOLHDL", "LDLDIRECT" in the last 72 hours. Thyroid function studies No results for input(s): "TSH", "T4TOTAL", "T3FREE", "THYROIDAB" in the last 72 hours.  Invalid input(s): "FREET3" Anemia work up No results for input(s): "VITAMINB12", "FOLATE", "FERRITIN", "TIBC", "IRON", "RETICCTPCT" in the last 72 hours. Urinalysis    Component Value Date/Time   COLORURINE YELLOW (A) 02/18/2023 0457   APPEARANCEUR TURBID (A) 02/18/2023 0457   APPEARANCEUR Cloudy (A) 12/19/2022 1304   LABSPEC 1.015 02/18/2023 0457   PHURINE 6.0 02/18/2023 0457   GLUCOSEU NEGATIVE 02/18/2023 0457   HGBUR LARGE (A) 02/18/2023 0457   BILIRUBINUR NEGATIVE 02/18/2023 0457   BILIRUBINUR Negative 12/19/2022 1304   KETONESUR NEGATIVE 02/18/2023 0457   PROTEINUR 100 (A) 02/18/2023 0457   NITRITE NEGATIVE 02/18/2023 0457   LEUKOCYTESUR LARGE (A) 02/18/2023 0457   Sepsis  Labs Recent Labs  Lab 02/18/23 0457 02/19/23 0629  WBC 9.3 8.3   Microbiology Recent Results (from the past 240 hour(s))  SARS Coronavirus 2 by RT PCR (hospital order, performed in [facility] hospital lab) *cepheid single result test* Anterior Nasal Swab     Status: None   Collection Time: 02/18/23  4:57 AM   Specimen: Anterior Nasal Swab  Result Value Ref Range Status   SARS Coronavirus 2 by RT PCR NEGATIVE NEGATIVE Final    Comment: (NOTE) SARS-CoV-2 target nucleic acids are NOT DETECTED.  The SARS-CoV-2 RNA is generally detectable in upper and lower respiratory specimens during the acute phase  of infection. The lowest concentration of SARS-CoV-2 viral copies this assay can detect is 250 copies / mL. A negative result does not preclude SARS-CoV-2 infection and should not be used as the sole basis for treatment or other patient management decisions.  A negative result may occur with improper specimen collection / handling, submission of specimen other than nasopharyngeal swab, presence of viral mutation(s) within the areas targeted by this assay, and inadequate number of viral copies (<250 copies / mL). A negative result must be combined with clinical observations, patient history, and epidemiological information.  Fact Sheet for Patients:   RoadLapTop.co.za  Fact Sheet for Healthcare Providers: http://kim-miller.com/  This test is not yet approved or  cleared by the [place] FDA and has been authorized for detection and/or diagnosis of SARS-CoV-2 by FDA under an Emergency Use Authorization (EUA).  This EUA will remain in effect (meaning this test can be used) for the duration of the COVID-19 declaration under Section 564(b)(1) of the Act, 21 U.S.C. section 360bbb-3(b)(1), unless the authorization is terminated or revoked sooner.  Performed at [facility], [address] [place], [place] [address]   Urine  Culture     Status: Abnormal   Collection Time: 02/18/23  4:57 AM   Specimen: Urine, Random  Result Value Ref Range Status   Specimen Description   Final    URINE, RANDOM Performed at [facility], [address] [place], [place] [address]    Special Requests   Final    NONE Reflexed from [id number] Performed at [facility], [address] [place], [place] [address]    Culture >=100,000 COLONIES/mL ENTEROCOCCUS FAECALIS (A)  Final   Report Status 02/20/2023 FINAL  Final   Organism ID, Bacteria ENTEROCOCCUS FAECALIS (A)  Final      Susceptibility   Enterococcus faecalis - MIC*    AMPICILLIN <=2 SENSITIVE Sensitive     NITROFURANTOIN <=16 SENSITIVE Sensitive     VANCOMYCIN 1 SENSITIVE Sensitive     * >=100,000 COLONIES/mL ENTEROCOCCUS FAECALIS  Blood Culture (routine x 2)     Status: None (Preliminary result)   Collection Time: 02/18/23  4:58 AM   Specimen: BLOOD RIGHT ARM  Result Value Ref Range Status   Specimen Description BLOOD RIGHT ARM  Final   Special Requests   Final    BOTTLES DRAWN AEROBIC AND ANAEROBIC Blood Culture results may not be optimal due to an excessive volume of blood received in culture bottles   Culture   Final    NO GROWTH 3 DAYS Performed at [facility], [address] [place], [place] [address]    Report Status PENDING  Incomplete  Blood Culture (routine x 2)     Status: None (Preliminary result)   Collection Time: 02/18/23  4:58 AM   Specimen: BLOOD RIGHT ARM  Result Value Ref Range Status   Specimen Description BLOOD RIGHT ARM  Final   Special Requests Blood Culture adequate volume  Final   Culture   Final    NO GROWTH 3 DAYS Performed at [facility], [address] [place], [place] [address]    Report Status PENDING  Incomplete  MRSA Next Gen by PCR, Nasal     Status: None   Collection Time: 02/18/23  7:15 PM   Specimen: Nasal Mucosa; Nasal Swab  Result Value Ref Range Status   MRSA by PCR Next Gen  NOT DETECTED NOT DETECTED Final    Comment: (NOTE) The GeneXpert MRSA Assay (  FDA approved for NASAL specimens only), is one component of a comprehensive MRSA colonization surveillance program. It is not intended to diagnose MRSA infection nor to guide or monitor treatment for MRSA infections. Test performance is not FDA approved in patients less than 75 years old. Performed at [facility], [address], [place], [place] [address]      Time coordinating discharge: Over 30 minutes  SIGNED:   Tresa Moore, MD  [facility] 02/21/2023, 11:31 AM Pager   If 7PM-7AM, please contact night-coverage

## 2023-07-15 ENCOUNTER — Encounter: Payer: Self-pay | Admitting: Emergency Medicine

## 2023-07-15 ENCOUNTER — Other Ambulatory Visit: Payer: Self-pay

## 2023-07-15 DIAGNOSIS — T83511A Infection and inflammatory reaction due to indwelling urethral catheter, initial encounter: Secondary | ICD-10-CM | POA: Diagnosis not present

## 2023-07-15 DIAGNOSIS — G9341 Metabolic encephalopathy: Secondary | ICD-10-CM | POA: Diagnosis present

## 2023-07-15 DIAGNOSIS — E785 Hyperlipidemia, unspecified: Secondary | ICD-10-CM | POA: Diagnosis present

## 2023-07-15 DIAGNOSIS — A4150 Gram-negative sepsis, unspecified: Secondary | ICD-10-CM | POA: Diagnosis present

## 2023-07-15 DIAGNOSIS — Z882 Allergy status to sulfonamides status: Secondary | ICD-10-CM

## 2023-07-15 DIAGNOSIS — Z1612 Extended spectrum beta lactamase (ESBL) resistance: Secondary | ICD-10-CM | POA: Diagnosis present

## 2023-07-15 DIAGNOSIS — E66813 Obesity, class 3: Secondary | ICD-10-CM | POA: Diagnosis present

## 2023-07-15 DIAGNOSIS — Z8673 Personal history of transient ischemic attack (TIA), and cerebral infarction without residual deficits: Secondary | ICD-10-CM

## 2023-07-15 DIAGNOSIS — K580 Irritable bowel syndrome with diarrhea: Secondary | ICD-10-CM | POA: Diagnosis present

## 2023-07-15 DIAGNOSIS — Z1152 Encounter for screening for COVID-19: Secondary | ICD-10-CM

## 2023-07-15 DIAGNOSIS — G8250 Quadriplegia, unspecified: Secondary | ICD-10-CM | POA: Diagnosis present

## 2023-07-15 DIAGNOSIS — Z7982 Long term (current) use of aspirin: Secondary | ICD-10-CM

## 2023-07-15 DIAGNOSIS — Z79899 Other long term (current) drug therapy: Secondary | ICD-10-CM

## 2023-07-15 DIAGNOSIS — R6520 Severe sepsis without septic shock: Secondary | ICD-10-CM | POA: Diagnosis present

## 2023-07-15 DIAGNOSIS — Z888 Allergy status to other drugs, medicaments and biological substances status: Secondary | ICD-10-CM

## 2023-07-15 DIAGNOSIS — I2692 Saddle embolus of pulmonary artery without acute cor pulmonale: Secondary | ICD-10-CM | POA: Diagnosis present

## 2023-07-15 DIAGNOSIS — K219 Gastro-esophageal reflux disease without esophagitis: Secondary | ICD-10-CM | POA: Diagnosis present

## 2023-07-15 DIAGNOSIS — Z87440 Personal history of urinary (tract) infections: Secondary | ICD-10-CM

## 2023-07-15 DIAGNOSIS — Z833 Family history of diabetes mellitus: Secondary | ICD-10-CM

## 2023-07-15 DIAGNOSIS — Z7984 Long term (current) use of oral hypoglycemic drugs: Secondary | ICD-10-CM

## 2023-07-15 DIAGNOSIS — F32A Depression, unspecified: Secondary | ICD-10-CM | POA: Diagnosis present

## 2023-07-15 DIAGNOSIS — Z8249 Family history of ischemic heart disease and other diseases of the circulatory system: Secondary | ICD-10-CM

## 2023-07-15 DIAGNOSIS — Z1624 Resistance to multiple antibiotics: Secondary | ICD-10-CM | POA: Diagnosis present

## 2023-07-15 DIAGNOSIS — Y846 Urinary catheterization as the cause of abnormal reaction of the patient, or of later complication, without mention of misadventure at the time of the procedure: Secondary | ICD-10-CM | POA: Diagnosis present

## 2023-07-15 DIAGNOSIS — Z7901 Long term (current) use of anticoagulants: Secondary | ICD-10-CM

## 2023-07-15 DIAGNOSIS — Z6841 Body Mass Index (BMI) 40.0 and over, adult: Secondary | ICD-10-CM

## 2023-07-15 DIAGNOSIS — N179 Acute kidney failure, unspecified: Secondary | ICD-10-CM | POA: Diagnosis present

## 2023-07-15 DIAGNOSIS — I2510 Atherosclerotic heart disease of native coronary artery without angina pectoris: Secondary | ICD-10-CM | POA: Diagnosis present

## 2023-07-15 DIAGNOSIS — E1140 Type 2 diabetes mellitus with diabetic neuropathy, unspecified: Secondary | ICD-10-CM | POA: Diagnosis present

## 2023-07-15 DIAGNOSIS — I10 Essential (primary) hypertension: Secondary | ICD-10-CM | POA: Diagnosis present

## 2023-07-15 DIAGNOSIS — Z96651 Presence of right artificial knee joint: Secondary | ICD-10-CM | POA: Diagnosis present

## 2023-07-15 DIAGNOSIS — N151 Renal and perinephric abscess: Secondary | ICD-10-CM | POA: Diagnosis not present

## 2023-07-15 LAB — COMPREHENSIVE METABOLIC PANEL
ALT: 31 U/L (ref 0–44)
AST: 38 U/L (ref 15–41)
Albumin: 2.9 g/dL — ABNORMAL LOW (ref 3.5–5.0)
Alkaline Phosphatase: 105 U/L (ref 38–126)
Anion gap: 14 (ref 5–15)
BUN: 16 mg/dL (ref 6–20)
CO2: 22 mmol/L (ref 22–32)
Calcium: 8.4 mg/dL — ABNORMAL LOW (ref 8.9–10.3)
Chloride: 98 mmol/L (ref 98–111)
Creatinine, Ser: 1.2 mg/dL — ABNORMAL HIGH (ref 0.44–1.00)
GFR, Estimated: 53 mL/min — ABNORMAL LOW (ref >60)
Glucose, Bld: 80 mg/dL (ref 70–99)
Potassium: 4.3 mmol/L (ref 3.5–5.1)
Sodium: 134 mmol/L — ABNORMAL LOW (ref 135–145)
Total Bilirubin: 1.4 mg/dL — ABNORMAL HIGH (ref 0.0–1.2)
Total Protein: 7.8 g/dL (ref 6.5–8.1)

## 2023-07-15 LAB — CBC
HCT: 38.1 % (ref 36.0–46.0)
Hemoglobin: 12 g/dL (ref 12.0–15.0)
MCH: 27.6 pg (ref 26.0–34.0)
MCHC: 31.5 g/dL (ref 30.0–36.0)
MCV: 87.6 fL (ref 80.0–100.0)
Platelets: 235 10*3/uL (ref 150–400)
RBC: 4.35 MIL/uL (ref 3.87–5.11)
RDW: 14.7 % (ref 11.5–15.5)
WBC: 14.2 10*3/uL — ABNORMAL HIGH (ref 4.0–10.5)
nRBC: 0 % (ref 0.0–0.2)

## 2023-07-15 LAB — LACTIC ACID, PLASMA: Lactic Acid, Venous: 1.4 mmol/L (ref 0.5–1.9)

## 2023-07-15 NOTE — ED Triage Notes (Signed)
EMS brings pt in from [facility] for AMS for several days; foley cath in place

## 2023-07-15 NOTE — Progress Notes (Signed)
Dear Doctor:  This patient has been identified as a candidate for CENTRAL LINE for the following reason (s): poor veins/poor circulatory system (CHF, COPD, emphysema, diabetes, steroid use, IV drug abuse, etc.)   VAST assessment with no suitable veins present for USGPIV, Midline, or PICC.    Thank you for supporting the early vascular access assessment program.

## 2023-07-15 NOTE — ED Triage Notes (Signed)
See First Nurse note. Patient not cooperative during triage process states she is unsure why she is here. Denies pain.

## 2023-07-16 ENCOUNTER — Emergency Department: Payer: 59

## 2023-07-16 ENCOUNTER — Inpatient Hospital Stay
Admission: EM | Admit: 2023-07-16 | Discharge: 2023-07-22 | DRG: 698 | Disposition: A | Discharge status: Skilled Nursing Facility | Payer: 59 | Attending: Student in an Organized Health Care Education/Training Program | Admitting: Student in an Organized Health Care Education/Training Program

## 2023-07-16 DIAGNOSIS — N151 Renal and perinephric abscess: Secondary | ICD-10-CM | POA: Diagnosis present

## 2023-07-16 DIAGNOSIS — E785 Hyperlipidemia, unspecified: Secondary | ICD-10-CM | POA: Diagnosis present

## 2023-07-16 DIAGNOSIS — I2699 Other pulmonary embolism without acute cor pulmonale: Secondary | ICD-10-CM

## 2023-07-16 DIAGNOSIS — Z1152 Encounter for screening for COVID-19: Secondary | ICD-10-CM | POA: Diagnosis not present

## 2023-07-16 DIAGNOSIS — Z888 Allergy status to other drugs, medicaments and biological substances status: Secondary | ICD-10-CM | POA: Diagnosis not present

## 2023-07-16 DIAGNOSIS — A419 Sepsis, unspecified organism: Secondary | ICD-10-CM | POA: Diagnosis present

## 2023-07-16 DIAGNOSIS — R6520 Severe sepsis without septic shock: Secondary | ICD-10-CM | POA: Diagnosis present

## 2023-07-16 DIAGNOSIS — F32A Depression, unspecified: Secondary | ICD-10-CM | POA: Diagnosis present

## 2023-07-16 DIAGNOSIS — J9601 Acute respiratory failure with hypoxia: Secondary | ICD-10-CM | POA: Diagnosis not present

## 2023-07-16 DIAGNOSIS — Z7984 Long term (current) use of oral hypoglycemic drugs: Secondary | ICD-10-CM | POA: Diagnosis not present

## 2023-07-16 DIAGNOSIS — Y846 Urinary catheterization as the cause of abnormal reaction of the patient, or of later complication, without mention of misadventure at the time of the procedure: Secondary | ICD-10-CM | POA: Diagnosis present

## 2023-07-16 DIAGNOSIS — R509 Fever, unspecified: Secondary | ICD-10-CM

## 2023-07-16 DIAGNOSIS — K219 Gastro-esophageal reflux disease without esophagitis: Secondary | ICD-10-CM | POA: Diagnosis present

## 2023-07-16 DIAGNOSIS — Z8673 Personal history of transient ischemic attack (TIA), and cerebral infarction without residual deficits: Secondary | ICD-10-CM | POA: Diagnosis not present

## 2023-07-16 DIAGNOSIS — N390 Urinary tract infection, site not specified: Secondary | ICD-10-CM

## 2023-07-16 DIAGNOSIS — G9341 Metabolic encephalopathy: Secondary | ICD-10-CM | POA: Diagnosis present

## 2023-07-16 DIAGNOSIS — N179 Acute kidney failure, unspecified: Secondary | ICD-10-CM | POA: Diagnosis present

## 2023-07-16 DIAGNOSIS — I2510 Atherosclerotic heart disease of native coronary artery without angina pectoris: Secondary | ICD-10-CM | POA: Diagnosis present

## 2023-07-16 DIAGNOSIS — R4182 Altered mental status, unspecified: Principal | ICD-10-CM

## 2023-07-16 DIAGNOSIS — R051 Acute cough: Secondary | ICD-10-CM

## 2023-07-16 DIAGNOSIS — A4150 Gram-negative sepsis, unspecified: Secondary | ICD-10-CM | POA: Diagnosis present

## 2023-07-16 DIAGNOSIS — Z1612 Extended spectrum beta lactamase (ESBL) resistance: Secondary | ICD-10-CM | POA: Diagnosis present

## 2023-07-16 DIAGNOSIS — Z1624 Resistance to multiple antibiotics: Secondary | ICD-10-CM | POA: Diagnosis present

## 2023-07-16 DIAGNOSIS — I10 Essential (primary) hypertension: Secondary | ICD-10-CM | POA: Diagnosis present

## 2023-07-16 DIAGNOSIS — G8250 Quadriplegia, unspecified: Secondary | ICD-10-CM | POA: Diagnosis present

## 2023-07-16 DIAGNOSIS — N12 Tubulo-interstitial nephritis, not specified as acute or chronic: Secondary | ICD-10-CM

## 2023-07-16 DIAGNOSIS — Z8249 Family history of ischemic heart disease and other diseases of the circulatory system: Secondary | ICD-10-CM | POA: Diagnosis not present

## 2023-07-16 DIAGNOSIS — T83511A Infection and inflammatory reaction due to indwelling urethral catheter, initial encounter: Secondary | ICD-10-CM | POA: Diagnosis present

## 2023-07-16 DIAGNOSIS — Z6841 Body Mass Index (BMI) 40.0 and over, adult: Secondary | ICD-10-CM | POA: Diagnosis not present

## 2023-07-16 DIAGNOSIS — E1140 Type 2 diabetes mellitus with diabetic neuropathy, unspecified: Secondary | ICD-10-CM | POA: Diagnosis present

## 2023-07-16 DIAGNOSIS — Z882 Allergy status to sulfonamides status: Secondary | ICD-10-CM | POA: Diagnosis not present

## 2023-07-16 DIAGNOSIS — Z79899 Other long term (current) drug therapy: Secondary | ICD-10-CM | POA: Diagnosis not present

## 2023-07-16 DIAGNOSIS — Z7901 Long term (current) use of anticoagulants: Secondary | ICD-10-CM | POA: Diagnosis not present

## 2023-07-16 DIAGNOSIS — I2692 Saddle embolus of pulmonary artery without acute cor pulmonale: Secondary | ICD-10-CM | POA: Diagnosis present

## 2023-07-16 LAB — RESP PANEL BY RT-PCR (RSV, FLU A&B, COVID)  RVPGX2
Influenza A by PCR: NEGATIVE
Influenza B by PCR: NEGATIVE
Resp Syncytial Virus by PCR: NEGATIVE
SARS Coronavirus 2 by RT PCR: NEGATIVE

## 2023-07-16 LAB — URINALYSIS, ROUTINE W REFLEX MICROSCOPIC
Bilirubin Urine: NEGATIVE
Glucose, UA: NEGATIVE mg/dL
Ketones, ur: 20 mg/dL — AB
Nitrite: POSITIVE — AB
Protein, ur: 100 mg/dL — AB
RBC / HPF: >50 RBC/hpf (ref 0–5)
Specific Gravity, Urine: 1.016 (ref 1.005–1.030)
Squamous Epithelial / HPF: 0 /[HPF] (ref 0–5)
WBC, UA: >50 WBC/hpf (ref 0–5)
pH: 5 (ref 5.0–8.0)

## 2023-07-16 LAB — APTT
aPTT: 28 s (ref 24–36)
aPTT: 56 s — ABNORMAL HIGH (ref 24–36)
aPTT: 56 s — ABNORMAL HIGH (ref 24–36)

## 2023-07-16 LAB — CBG MONITORING, ED
Glucose-Capillary: 66 mg/dL — ABNORMAL LOW (ref 70–99)
Glucose-Capillary: 104 mg/dL — ABNORMAL HIGH (ref 70–99)
Glucose-Capillary: 62 mg/dL — ABNORMAL LOW (ref 70–99)
Glucose-Capillary: 159 mg/dL — ABNORMAL HIGH (ref 70–99)

## 2023-07-16 LAB — PROTIME-INR
INR: 1.2 (ref 0.8–1.2)
Prothrombin Time: 15.6 s — ABNORMAL HIGH (ref 11.4–15.2)

## 2023-07-16 LAB — HEPARIN LEVEL (UNFRACTIONATED): Heparin Unfractionated: <0.1 [IU]/mL — ABNORMAL LOW (ref 0.30–0.70)

## 2023-07-16 LAB — MRSA NEXT GEN BY PCR, NASAL: MRSA by PCR Next Gen: DETECTED — AB

## 2023-07-16 LAB — HEMOGLOBIN A1C
Hgb A1c MFr Bld: 6 % — ABNORMAL HIGH (ref 4.8–5.6)
Mean Plasma Glucose: 126 mg/dL

## 2023-07-16 MED ORDER — PIPERACILLIN-TAZOBACTAM 3.375 G IVPB
3.3750 g | Freq: Three times a day (TID) | INTRAVENOUS | Status: DC
  Administered 2023-07-16 – 2023-07-22 (×19): 3.375 g via INTRAVENOUS
  Filled 2023-07-16 (×20): qty 50

## 2023-07-16 MED ORDER — PREGABALIN 75 MG PO CAPS
150.0000 mg | ORAL_CAPSULE | Freq: Two times a day (BID) | ORAL | Status: DC
Start: 2023-07-16 — End: 2023-07-22
  Administered 2023-07-16 – 2023-07-22 (×13): 150 mg via ORAL
  Filled 2023-07-16 (×13): qty 2

## 2023-07-16 MED ORDER — DEXTROSE 50 % IV SOLN: 1.0000 | INTRAVENOUS | Status: DC | PRN

## 2023-07-16 MED ORDER — BENZONATATE 100 MG PO CAPS: 100.0000 mg | ORAL_CAPSULE | Freq: Every day | ORAL | Status: DC | PRN

## 2023-07-16 MED ORDER — DEXTROSE IN LACTATED RINGERS 5 % IV SOLN: INTRAVENOUS | Status: DC

## 2023-07-16 MED ORDER — HEPARIN BOLUS VIA INFUSION
1350.0000 [IU] | Freq: Once | INTRAVENOUS | Status: AC
  Administered 2023-07-16: 1350 [IU] via INTRAVENOUS
  Filled 2023-07-16: qty 1350

## 2023-07-16 MED ORDER — ACETAMINOPHEN 325 MG PO TABS
650.0000 mg | ORAL_TABLET | Freq: Four times a day (QID) | ORAL | Status: DC | PRN
  Administered 2023-07-18: 650 mg via ORAL
  Filled 2023-07-16: qty 2

## 2023-07-16 MED ORDER — IOHEXOL 350 MG/ML SOLN
100.0000 mL | Freq: Once | INTRAVENOUS | Status: AC | PRN
  Administered 2023-07-16: 100 mL via INTRAVENOUS

## 2023-07-16 MED ORDER — DULOXETINE HCL 30 MG PO CPEP
60.0000 mg | ORAL_CAPSULE | Freq: Every day | ORAL | Status: DC
  Administered 2023-07-16 – 2023-07-22 (×7): 60 mg via ORAL
  Filled 2023-07-16 (×2): qty 2
  Filled 2023-07-16: qty 1
  Filled 2023-07-16 (×2): qty 2
  Filled 2023-07-16: qty 1
  Filled 2023-07-16: qty 2

## 2023-07-16 MED ORDER — VANCOMYCIN HCL 500 MG/100ML IV SOLN
500.0000 mg | Freq: Once | INTRAVENOUS | Status: AC
  Administered 2023-07-16: 500 mg via INTRAVENOUS
  Filled 2023-07-16: qty 100

## 2023-07-16 MED ORDER — HEPARIN (PORCINE) 25000 UT/250ML-% IV SOLN
1600.0000 [IU]/h | INTRAVENOUS | Status: DC
Start: 2023-07-16 — End: 2023-07-18
  Administered 2023-07-16: 1250 [IU]/h via INTRAVENOUS
  Administered 2023-07-16 – 2023-07-18 (×3): 1600 [IU]/h via INTRAVENOUS
  Filled 2023-07-16 (×4): qty 250

## 2023-07-16 MED ORDER — DEXTROSE 50 % IV SOLN
1.0000 | Freq: Once | INTRAVENOUS | Status: AC
  Administered 2023-07-16: 50 mL via INTRAVENOUS

## 2023-07-16 MED ORDER — SODIUM CHLORIDE 0.9 % IV SOLN
500.0000 mg | Freq: Once | INTRAVENOUS | Status: AC
  Administered 2023-07-16: 500 mg via INTRAVENOUS
  Filled 2023-07-16: qty 5

## 2023-07-16 MED ORDER — PIPERACILLIN-TAZOBACTAM 3.375 G IVPB 30 MIN
3.3750 g | Freq: Once | INTRAVENOUS | Status: AC
  Administered 2023-07-16: 3.375 g via INTRAVENOUS
  Filled 2023-07-16 (×2): qty 50

## 2023-07-16 MED ORDER — ORAL RELIEF FOR DRY MOUTH MT LOZG: 1.0000 | LOZENGE | OROMUCOSAL | Status: DC | PRN

## 2023-07-16 MED ORDER — SODIUM CHLORIDE 0.9 % IV SOLN
1.0000 g | Freq: Once | INTRAVENOUS | Status: AC
  Administered 2023-07-16: 1 g via INTRAVENOUS
  Filled 2023-07-16: qty 10

## 2023-07-16 MED ORDER — LACTATED RINGERS IV SOLN: INTRAVENOUS | Status: DC

## 2023-07-16 MED ORDER — ONDANSETRON HCL 4 MG/2ML IJ SOLN: 4.0000 mg | Freq: Four times a day (QID) | INTRAMUSCULAR | Status: DC | PRN

## 2023-07-16 MED ORDER — INSULIN ASPART 100 UNIT/ML IJ SOLN: 0.0000 [IU] | Freq: Every day | INTRAMUSCULAR | Status: DC

## 2023-07-16 MED ORDER — INSULIN ASPART 100 UNIT/ML IJ SOLN
0.0000 [IU] | Freq: Three times a day (TID) | INTRAMUSCULAR | Status: DC
  Administered 2023-07-18 – 2023-07-21 (×3): 1 [IU] via SUBCUTANEOUS
  Filled 2023-07-16 (×2): qty 1

## 2023-07-16 MED ORDER — SODIUM CHLORIDE 0.9 % IV BOLUS
1500.0000 mL | Freq: Once | INTRAVENOUS | Status: AC
  Administered 2023-07-16: 1500 mL via INTRAVENOUS

## 2023-07-16 MED ORDER — PANTOPRAZOLE SODIUM 40 MG PO TBEC
40.0000 mg | DELAYED_RELEASE_TABLET | Freq: Every day | ORAL | Status: DC
  Administered 2023-07-16 – 2023-07-22 (×7): 40 mg via ORAL
  Filled 2023-07-16 (×7): qty 1

## 2023-07-16 MED ORDER — ATORVASTATIN CALCIUM 10 MG PO TABS
10.0000 mg | ORAL_TABLET | Freq: Every day | ORAL | Status: DC
  Administered 2023-07-16 – 2023-07-22 (×7): 10 mg via ORAL
  Filled 2023-07-16 (×7): qty 1

## 2023-07-16 MED ORDER — DULOXETINE HCL 30 MG PO CPEP
30.0000 mg | ORAL_CAPSULE | Freq: Every day | ORAL | Status: DC
  Administered 2023-07-16 – 2023-07-22 (×7): 30 mg via ORAL
  Filled 2023-07-16 (×7): qty 1

## 2023-07-16 MED ORDER — BACLOFEN 10 MG PO TABS
5.0000 mg | ORAL_TABLET | Freq: Three times a day (TID) | ORAL | Status: DC
  Administered 2023-07-16 – 2023-07-22 (×19): 5 mg via ORAL
  Filled 2023-07-16 (×19): qty 1

## 2023-07-16 MED ORDER — ASPIRIN 81 MG PO CHEW
81.0000 mg | CHEWABLE_TABLET | Freq: Every day | ORAL | Status: DC
  Administered 2023-07-16: 81 mg via ORAL
  Filled 2023-07-16: qty 1

## 2023-07-16 MED ORDER — LINACLOTIDE 290 MCG PO CAPS
290.0000 ug | ORAL_CAPSULE | Freq: Every day | ORAL | Status: DC
  Filled 2023-07-16 (×3): qty 1

## 2023-07-16 MED ORDER — FLUTICASONE PROPIONATE 50 MCG/ACT NA SUSP
1.0000 | Freq: Every day | NASAL | Status: DC
  Administered 2023-07-17 – 2023-07-21 (×2): 1 via NASAL
  Filled 2023-07-16 (×2): qty 16

## 2023-07-16 MED ORDER — VANCOMYCIN HCL 2000 MG/400ML IV SOLN
2000.0000 mg | Freq: Once | INTRAVENOUS | Status: AC
  Administered 2023-07-16: 2000 mg via INTRAVENOUS
  Filled 2023-07-16: qty 400

## 2023-07-16 MED ORDER — TRAZODONE HCL 50 MG PO TABS
50.0000 mg | ORAL_TABLET | Freq: Every day | ORAL | Status: DC
  Administered 2023-07-16 – 2023-07-21 (×6): 50 mg via ORAL
  Filled 2023-07-16 (×6): qty 1

## 2023-07-16 MED ORDER — PROMETHAZINE HCL 25 MG PO TABS: 12.5000 mg | ORAL_TABLET | Freq: Four times a day (QID) | ORAL | Status: DC | PRN

## 2023-07-16 MED ORDER — SODIUM CHLORIDE 0.9 % IV BOLUS
1000.0000 mL | Freq: Once | INTRAVENOUS | Status: AC
  Administered 2023-07-16: 1000 mL via INTRAVENOUS

## 2023-07-16 NOTE — Consult Note (Signed)
   57 y.o. female admitted with sepsis from a urinary source.  Was asked to review CT regarding right phlegmon/possible early abscess to see if urologic procedure indicated.  CT was reviewed.  There is an area of hypoperfusion lower pole right kidney.  No rim enhancement.  I do not see a drainable fluid collection.  Recommend continuing IV antibiotics.  If she does not respond clinically would reimage.  If she does respond clinically would reimage within the next 1-2 weeks.   If she needed drainage this would need to be evaluated/performed by interventional radiology.

## 2023-07-16 NOTE — Progress Notes (Signed)
 PHARMACY - ANTICOAGULATION CONSULT NOTE  Pharmacy Consult for Heparin   Indication: DVT  Allergies  Allergen Reactions   Melatonin    Misc. Sulfonamide Containing Compounds    Neomycin Other (See Comments)    Positive patch test   Sulfa Antibiotics Itching   Patient Measurements: Height: 5' 5 (165.1 cm) Weight: (!) 136.8 kg (301 lb 9.4 oz) IBW/kg (Calculated) : 57 Heparin  Dosing Weight: 90.9 kg   Vital Signs: Temp: 98.1 F (36.7 C) (12/31 2237) Temp Source: Oral (12/31 2237) BP: 103/74 (12/31 2230) Pulse Rate: 90 (12/31 2230)  Labs: Recent Labs    07/15/23 2248 07/16/23 0627 07/16/23 1408 07/16/23 2231  HGB 12.0  --   --   --   HCT 38.1  --   --   --   PLT 235  --   --   --   APTT  --  28 56* 56*  LABPROT  --  15.6*  --   --   INR  --  1.2  --   --   HEPARINUNFRC  --  <0.10*  --   --   CREATININE 1.20*  --   --   --    Estimated Creatinine Clearance: 74.3 mL/min (A) (by C-G formula based on SCr of 1.2 mg/dL (H)).  Medical History: Past Medical History:  Diagnosis Date   Quadriplegia [facility])    Stroke (HCC) 09/2020   Medications:  Patient on Eliquis  5 mg BID PTA for DVT ppx.  Last dose unsure but will assume evening of 12/30 Last fill on file was 02/15/2023  Assessment: SD is a 57 year old female who presented to the ED with altered mental status from her care home. Admitted for severe sepsis with septic shock. Taking apixaban  at home for DVT, unclear adherence, could be non-compliance vs treatment failure. Continue heparin  drip for 48 hours then transition to possibly alternative DOAC if considered treatment failure. Unclear at this time, but will continue to follow. Pharmacy was consulted to dose and manage heparin . Heparin  started at 12/31@0700  monitor to end 1/2@0700 .   Goal of Therapy:  aPTT 66 - 102 seconds HL 0.3 - 0.7 Monitor platelets by anticoagulation protocol: Yes  Heparin  Level:  Dose HL/aPTT Clinical Assessment  12/31@0627  aPTT = 28 HL = >  1.10 Subtherapeutic   12/31@1408  aPTT = 56 Subtherapeutic   12/31@2231  aPTT = 56 Subtherapeutic       Plan:  12/31:  aPTT @ 2231 = 56, SUBtherapeutic  - will order heparin  1350 units IV X 1 and increase drip rate to 1600 units/hr Will recheck aPTT, HL 6 hrs after rate change Monitor CBC with AM labs  Gilbert Manolis D, PharmD 07/16/2023 11:20 PM

## 2023-07-16 NOTE — ED Provider Notes (Addendum)
 [facility] Provider Note    Event Date/Time   First MD Initiated Contact with Patient 07/16/23 0104     (approximate)   History   Altered Mental Status   HPI  Wendy Nelson is a 57 y.o. female   Past medical history of CVA, quadriplegia, CAD, type II diabetic, hypertension who presents to the emergency department with altered mental status.  Several days of symptoms. From [facility]   Patient states that she has had a dry cough.  Has had some sore throat over the last several days.  She denies chest pain. No shortness of breath.   Denies abdominal pain dysuria but does have a chronic indwelling Foley catheter and history of UTIs.  In fact she was admitted in August for pyelonephritis.  She denies any abdominal pain.   External Medical Documents Reviewed: August 2024 discharge summary for hospitalization for sepsis and pyelonephritis      Physical Exam   Triage Vital Signs: ED Triage Vitals  Encounter Vitals Group     BP 07/15/23 2043 (!) 116/55     Systolic BP Percentile --      Diastolic BP Percentile --      Pulse Rate 07/15/23 2043 (!) 110     Resp 07/15/23 2043 (!) 22     Temp 07/15/23 2043 100.1 F (37.8 C)     Temp Source 07/15/23 2043 Oral     SpO2 07/15/23 2011 99 %     Weight 07/15/23 2043 (!) 301 lb 9.4 oz (136.8 kg)     Height 07/15/23 2043 5' 5 (1.651 m)     Head Circumference --      [facility] --      Pain Score 07/15/23 2043 0     Pain Loc --      Pain Education --      Exclude from Growth Chart --     Most recent vital signs: Vitals:   07/16/23 0600 07/16/23 0630  BP: 91/78 (!) 89/66  Pulse: 99 95  Resp:  (!) 25  Temp:    SpO2: 95% 91%    General: Awake, no distress.  CV:  Good peripheral perfusion.  Resp:  Normal effort.  Abd:  No distention.  Other:  Slow to respond but answering my questions appropriately.  She is oriented to self and situation.  She is tachycardic to 108, had a respiratory rate of  22, normotensive, and a temperature of 100.1 Fahrenheit.  She has a distended abdomen that is soft and nontender with no rigidity or guarding.   ED Results / Procedures / Treatments   Labs (all labs ordered are listed, but only abnormal results are displayed) Labs Reviewed  COMPREHENSIVE METABOLIC PANEL - Abnormal; Notable for the following components:      Result Value   Sodium 134 (*)    Creatinine, Ser 1.20 (*)    Calcium  8.4 (*)    Albumin 2.9 (*)    Total Bilirubin 1.4 (*)    GFR, Estimated 53 (*)    All other components within normal limits  CBC - Abnormal; Notable for the following components:   WBC 14.2 (*)    All other components within normal limits  URINALYSIS, ROUTINE W REFLEX MICROSCOPIC - Abnormal; Notable for the following components:   Color, Urine AMBER (*)    APPearance TURBID (*)    Hgb urine dipstick LARGE (*)    Ketones, ur 20 (*)    Protein, ur 100 (*)  Nitrite POSITIVE (*)    Leukocytes,Ua LARGE (*)    Bacteria, UA MANY (*)    All other components within normal limits  RESP PANEL BY RT-PCR (RSV, FLU A&B, COVID)  RVPGX2  CULTURE, BLOOD (ROUTINE X 2)  CULTURE, BLOOD (ROUTINE X 2)  URINE CULTURE  LACTIC ACID, PLASMA  HEPARIN  LEVEL (UNFRACTIONATED)  APTT  PROTIME-INR  CBG MONITORING, ED     I ordered and reviewed the above labs they are notable for white blood cell count is elevated at 14.2 and lactic normal.  EKG  ED ECG REPORT I, Ginnie Shams, the attending physician, personally viewed and interpreted this ECG.   Date: 07/16/2023  EKG Time: 2051  Rate: 109  Rhythm: sinus tachycardia  Axis: nl  Intervals:none  ST&T Change: no stemi    RADIOLOGY I independently reviewed and interpreted chest x-ray and see no obvious focality or pneumothorax I also reviewed radiologist's formal read.   PROCEDURES:  Critical Care performed: Yes, see critical care procedure note(s)  .Critical Care  Performed by: Shams Ginnie, MD Authorized by: Shams Ginnie, MD   Critical care provider statement:    Critical care time (minutes):  60   Critical care was time spent personally by me on the following activities:  Development of treatment plan with patient or surrogate, discussions with consultants, evaluation of patient's response to treatment, examination of patient, ordering and review of laboratory studies, ordering and review of radiographic studies, ordering and performing treatments and interventions, pulse oximetry, re-evaluation of patient's condition and review of old charts .Ultrasound ED Peripheral IV (Provider)  Date/Time: 07/16/2023 1:59 AM  Performed by: Shams Ginnie, MD Authorized by: Shams Ginnie, MD   Procedure details:    Indications: multiple failed IV attempts and poor IV access     Skin Prep: isopropyl alcohol      Location:  Left AC   Angiocath:  20 G   Bedside Ultrasound Guided: Yes     Images: not archived     Patient tolerated procedure without complications: Yes     Dressing applied: Yes      MEDICATIONS ORDERED IN ED: Medications  vancomycin  (VANCOREADY) IVPB 2000 mg/400 mL (2,000 mg Intravenous New Bag/Given 07/16/23 0624)    Followed by  vancomycin  (VANCOREADY) IVPB 500 mg/100 mL (has no administration in time range)  heparin  ADULT infusion 100 units/mL (25000 units/250mL) (1,250 Units/hr Intravenous New Bag/Given 07/16/23 0642)  sodium chloride  0.9 % bolus 1,500 mL (0 mLs Intravenous Stopped 07/16/23 0449)  azithromycin  (ZITHROMAX ) 500 mg in sodium chloride  0.9 % 250 mL IVPB (0 mg Intravenous Stopped 07/16/23 0619)  cefTRIAXone  (ROCEPHIN ) 1 g in sodium chloride  0.9 % 100 mL IVPB (0 g Intravenous Stopped 07/16/23 0339)  piperacillin -tazobactam (ZOSYN ) IVPB 3.375 g (0 g Intravenous Stopped 07/16/23 0455)  iohexol  (OMNIPAQUE ) 350 MG/ML injection 100 mL (100 mLs Intravenous Contrast Given 07/16/23 0400)  sodium chloride  0.9 % bolus 1,000 mL (1,000 mLs Intravenous New Bag/Given 07/16/23 0608)    External  physician / consultants:  I spoke with hospital medicine for admission and regarding care plan for this patient.   IMPRESSION / MDM / ASSESSMENT AND PLAN / ED COURSE  I reviewed the triage vital signs and the nursing notes.                                Patient's presentation is most consistent with acute presentation with potential threat to life or bodily function.  Differential diagnosis includes, but is not limited to, infection/sepsis due to urinary tract infection, viral URI, bacterial pneumonia   The patient is on the cardiac monitor to evaluate for evidence of arrhythmia and/or significant heart rate changes.  MDM:    This is a patient who is concerning for infection due to her respiratory infectious symptoms, history of UTI and chronic indwelling Foley catheter as well as her low-grade fever, tachycardia and reported altered mental status.  Will replace Foley catheter and send urinalysis off of this. Check viral swabs. Check chest x-ray for bacterial pneumonia.  Check sepsis labs including lactic and blood cultures.  Given 30 cc/kg ideal body weight fluid bolus.  Will cover empirically with IV Rocephin  and azithromycin  for both community-acquired pneumonia coverage and suspected UTI possibly.  Admission.   -- Pt now hypotensive. Difficult IV access and need to re-obtain access is hindering the abx/fluids. Got peripheral guided in L AC. Pt had diarrhea. Also bleeding from ?vagina vs urethra after foley removed. Will broaden abx. Now has 2 functioning IV. Con't fluids and monitor BP. Getting CT scan c/a/p for source infx.  Sepsis reevaluation --blood pressures improved now 108/44 after IV reestablished and fluid resuscitation.  Admission will be pending results of urinalysis and CT scans.   -- CT w pyelonephritis, phlegmon/abscess in kidney. Urology notified [facility].) Also noted to have thin saddle PE w/o RH strain. Heparin  ordered (I think that the low BP is due to  sepsis and not massive PE nor blood loss as there is no further bleeding from likely urethral trauma from foley removal/placement.) Her BP is improving with fluids w MAP above 70. Admit to hospital service.      FINAL CLINICAL IMPRESSION(S) / ED DIAGNOSES   Final diagnoses:  Altered mental status, unspecified altered mental status type  Acute cough  Fever, unspecified fever cause  Sepsis, due to unspecified organism, unspecified whether acute organ dysfunction present [facility])  Pyelonephritis  Kidney abscess  Other acute pulmonary embolism without acute cor pulmonale (HCC)     Rx / DC Orders   ED Discharge Orders     None        Note:  This document was prepared using Dragon voice recognition software and may include unintentional dictation errors.    Cyrena Mylar, MD 07/16/23 AMPARO    Cyrena Mylar, MD 07/16/23 MARGENE    Cyrena Mylar, MD 07/16/23 9584    Cyrena Mylar, MD 07/16/23 9584    Cyrena Mylar, MD 07/16/23 [phone]

## 2023-07-16 NOTE — Progress Notes (Signed)
 Pharmacy Antibiotic Note  Wendy Nelson is a 57 y.o. female admitted on 07/16/2023 with sepsis. Patient presenting with altered mental status. PMH significant for stroke, CAD, T2DM, HTN, and quadriplegia. In ED, patient is afebrile with WBC 14.2 and lactate 1.4. Pharmacy has been consulted for Zosyn  dosing.  Plan: Start Zosyn  3.375 g IV every 8 hours based on current renal function Monitor renal function, clinical status, culture data, and LOT  Height: 5' 5 (165.1 cm) Weight: (!) 136.8 kg (301 lb 9.4 oz) IBW/kg (Calculated) : 57  Temp (24hrs), Avg:99.7 F (37.6 C), Min:99.1 F (37.3 C), Max:100.1 F (37.8 C)  Recent Labs  Lab 07/15/23 2248  WBC 14.2*  CREATININE 1.20*  LATICACIDVEN 1.4    Estimated Creatinine Clearance: 74.3 mL/min (A) (by C-G formula based on SCr of 1.2 mg/dL (H)).    Allergies  Allergen Reactions   Melatonin    Misc. Sulfonamide Containing Compounds    Neomycin Other (See Comments)    Positive patch test   Sulfa Antibiotics Itching   Antimicrobials this admission: ceftriaxone  12/30 x1 Azithromycin  12/30 x1 Vancomycin  12/31 x1 Zosyn  12/31 >>   Dose adjustments this admission: N/A  Microbiology results: 12/30 BCx: NG < 12 hours 12/31 MRSA PCR: pending  Thank you for involving pharmacy in this patient's care.   Damien Napoleon, PharmD Clinical Pharmacist 07/16/2023 8:09 AM

## 2023-07-16 NOTE — ED Notes (Addendum)
BP 82/71 at this time, Dr. Modesto Charon, EDP made aware. See new orders

## 2023-07-16 NOTE — Progress Notes (Signed)
 ED Pharmacy Antibiotic Sign Off An antibiotic consult was received from an ED provider for Vancomycin  per pharmacy dosing for sepsis. A chart review was completed to assess appropriateness.   The following one time order(s) were placed:  Vancomycin  2500 mg IV X 1   Further antibiotic and/or antibiotic pharmacy consults should be ordered by the admitting provider if indicated.   Thank you for allowing pharmacy to be a part of this patient's care.   Silver Selinda BIRCH, [facility]  Clinical Pharmacist 07/16/23 3:30 AM

## 2023-07-16 NOTE — ED Notes (Signed)
 Report received from [place], RN

## 2023-07-16 NOTE — ED Notes (Signed)
 Patient transported to CT

## 2023-07-16 NOTE — Progress Notes (Signed)
 PHARMACY - ANTICOAGULATION CONSULT NOTE  Pharmacy Consult for Heparin   Indication: DVT  Allergies  Allergen Reactions   Melatonin    Misc. Sulfonamide Containing Compounds    Neomycin Other (See Comments)    Positive patch test   Sulfa Antibiotics Itching    Patient Measurements: Height: 5' 5 (165.1 cm) Weight: (!) 136.8 kg (301 lb 9.4 oz) IBW/kg (Calculated) : 57 Heparin  Dosing Weight: 90.9 kg   Vital Signs: Temp: 99.1 F (37.3 C) (12/31 0500) Temp Source: Oral (12/31 0055) BP: 84/53 (12/31 0525) Pulse Rate: 99 (12/31 0525)  Labs: Recent Labs    07/15/23 2248  HGB 12.0  HCT 38.1  PLT 235  CREATININE 1.20*    Estimated Creatinine Clearance: 74.3 mL/min (A) (by C-G formula based on SCr of 1.2 mg/dL (H)).   Medical History: Past Medical History:  Diagnosis Date   Quadriplegia [facility])    Stroke (HCC) 09/2020    Medications:  (Not in a hospital admission)   Assessment: Pharmacy consulted to dose heparin  in this 57 year old female admitted with sepsis, AMS.  Pt was on Eliquis  5 mg PO BID PTA for DVT tx.   Unsure of last dose but pt came from [facility] so will assume evening dose on 12/30 PM.   Goal of Therapy:  aPTT 66 - 102 seconds HL 0.3 - 0.7 Monitor platelets by anticoagulation protocol: Yes   Plan:  Will order heparin  drip to begin at 1250 units/hr;  no bolus due to Eliquis  PTA.  - will use aPTT to guide dosing until correlating with HL - will draw aPTT 6 hrs after start of drip - will check HL on 01/01 with AM labs   Braylin Formby D 07/16/2023,6:15 AM

## 2023-07-16 NOTE — Progress Notes (Signed)
 PHARMACY - ANTICOAGULATION CONSULT NOTE  Pharmacy Consult for Heparin   Indication: DVT  Allergies  Allergen Reactions   Melatonin    Misc. Sulfonamide Containing Compounds    Neomycin Other (See Comments)    Positive patch test   Sulfa Antibiotics Itching   Patient Measurements: Height: 5' 5 (165.1 cm) Weight: (!) 136.8 kg (301 lb 9.4 oz) IBW/kg (Calculated) : 57 Heparin  Dosing Weight: 90.9 kg   Vital Signs: Temp: 99.1 F (37.3 C) (12/31 0500) BP: 105/74 (12/31 1430) Pulse Rate: 85 (12/31 1430)  Labs: Recent Labs    07/15/23 2248 07/16/23 0627 07/16/23 1408  HGB 12.0  --   --   HCT 38.1  --   --   PLT 235  --   --   APTT  --  28 56*  LABPROT  --  15.6*  --   INR  --  1.2  --   HEPARINUNFRC  --  <0.10*  --   CREATININE 1.20*  --   --    Estimated Creatinine Clearance: 74.3 mL/min (A) (by C-G formula based on SCr of 1.2 mg/dL (H)).  Medical History: Past Medical History:  Diagnosis Date   Quadriplegia [facility])    Stroke (HCC) 09/2020   Medications:  Patient on Eliquis  5 mg BID PTA for DVT ppx.  Last dose unsure but will assume evening of 12/30 Last fill on file was 02/15/2023  Assessment: SD is a 57 year old female who presented to the ED with altered mental status from her care home. Admitted for severe sepsis with septic shock. Taking apixaban  at home for DVT, unclear adherence, could be non-compliance vs treatment failure. Continue heparin  drip for 48 hours then transition to possibly alternative DOAC if considered treatment failure. Unclear at this time, but will continue to follow. Pharmacy was consulted to dose and manage heparin . Heparin  started at 12/31@0700  monitor to end 1/2@0700 .   Goal of Therapy:  aPTT 66 - 102 seconds HL 0.3 - 0.7 Monitor platelets by anticoagulation protocol: Yes  Heparin  Level:  Dose HL/aPTT Clinical Assessment  12/31@0627  aPTT = 28 HL = > 1.10 Subtherapeutic   12/31@1408  aPTT = 56 Subtherapeutic            Plan:  Will  order heparin  bolus 1,350 units x 1 Increase heparin  drip to 1,400 from 1250 units/hour  Will recheck aPTT 6 hrs after start of drip Monitor CBC with AM labs  Alfonso MARLA Buys, PharmD Pharmacy Resident  07/16/2023 3:41 PM

## 2023-07-16 NOTE — H&P (Signed)
 History and Physical    Wendy Nelson FMW:[id number] DOB: [birth year] DOA: 07/16/2023  PCP: [facility]  Patient coming from:   I have personally briefly reviewed patient's old medical records in [facility]  Chief Complaint:   HPI: Wendy Nelson is a 57 y.o. female with medical history significant of CVA, chronic quadriplegia, coronary artery disease, type 2 diabetes mellitus, hypertension who presents to the emergency department from [facility] with [facility] for altered mentation.  Patient states that she has a dry cough and sore throat over the last several days.  Does deny abdominal pain and dysuria however has a chronic indwelling Foley catheter and history of recurrent UTIs.  She was admitted to [facility] in August 2024 for treatment of pyelonephritis and associated sepsis.  CT abdomen chest done in emergency room reveals possible phlegmon on the pole of the right kidney.  Urology consulted for comment.  Does not recommend surgical intervention at this time.  Cultures were drawn and patient started on sepsis bundle.  Patient was hypotensive in ED but did respond to fluid resuscitation.  ED Course: CT imaging survey included chest abdomen pelvis.  CT abdomen findings regarding kidney as above.  CT also revealed a thin pulmonary embolism extending into both main pulmonary arteries.  Patient on home Eliquis .  This was held and heparin  GTT was initiated.  Review of Systems: As per HPI otherwise 14 point review of systems negative.    Past Medical History:  Diagnosis Date   Quadriplegia [facility])    Stroke (HCC) 09/2020    Past Surgical History:  Procedure Laterality Date   CERVICAL SPINE SURGERY     JOINT REPLACEMENT Right    Knee     reports that she has never smoked. She has never used smokeless tobacco. No history on file for alcohol  use and drug use.  Allergies  Allergen Reactions   Melatonin    Misc. Sulfonamide Containing Compounds    Neomycin Other  (See Comments)    Positive patch test   Sulfa Antibiotics Itching    Family History  Problem Relation Age of Onset   Diabetes Mellitus II Mother    Hypertension Mother    Diabetes Mellitus II Brother      Prior to Admission medications   Medication Sig Start Date End Date Taking? Authorizing Provider  Acetaminophen  500 MG capsule Take 1,000 mg by mouth every 6 (six) hours as needed. 10/12/20  Yes [provider]  Artificial Saliva (ORAL RELIEF FOR DRY MOUTH) LOZG 1 lozenge by Transmucosal route as needed (dry mouth).   Yes [provider]  aspirin  81 MG chewable tablet Chew 81 mg by mouth daily.   Yes [provider]  atorvastatin  (LIPITOR) 10 MG tablet Take 10 mg by mouth daily.   Yes [provider]  Baclofen  5 MG TABS Take 1 tablet by mouth 3 (three) times daily.   Yes [provider]  benzonatate  (TESSALON ) 100 MG capsule Take 100 mg by mouth daily as needed for cough.   Yes [provider]  CRANBERRY PO Take 450 mg by mouth 2 (two) times daily.   Yes [provider]  DULoxetine  (CYMBALTA ) 30 MG capsule Take 30 mg by mouth daily. 10/15/21  Yes [provider]  DULoxetine  (CYMBALTA ) 60 MG capsule Take 60 mg by mouth daily. 09/11/21  Yes [provider]  Emollient [facility]) OINT Apply 1 application. topically daily. To feet.   Yes [provider]  Ergocalciferol 50  MCG (2000 UT) TABS Take 2 tablets by mouth daily.   Yes [provider]  fluticasone  (FLONASE ) 50 MCG/ACT nasal spray Place 1 spray into both nostrils daily.   Yes [provider]  lidocaine  (LMX) 4 % cream Apply 1 Application topically daily. Apply to left shoulder.   Yes [provider]  LINZESS  290 MCG CAPS capsule Take 290 mcg by mouth daily before breakfast. 06/05/22  Yes [provider]  magnesium chloride (SLOW-MAG) 64 MG TBEC SR tablet Take 1 tablet by mouth daily.   Yes [provider]   meclizine  (ANTIVERT ) 25 MG tablet Take 25 mg by mouth daily as needed. 05/21/22  Yes [provider]  metFORMIN  (GLUCOPHAGE ) 500 MG tablet Take 500 mg by mouth 2 (two) times daily with a meal. 02/06/23  Yes [provider]  Multiple Vitamin (MULTIVITAMIN) tablet Take 1 tablet by mouth daily.   Yes [provider]  NYAMYC  powder Apply 1 Application topically 2 (two) times daily. To groin and abdominal folds   Yes [provider]  omeprazole (PRILOSEC) 40 MG capsule Take 40 mg by mouth daily. 12/03/22  Yes [provider]  Polyethylene Glycol 400 (VISINE DRY EYE RELIEF) 1 % SOLN Place 2 drops into both eyes 2 (two) times daily.   Yes [provider]  polyethylene glycol powder (GLYCOLAX /MIRALAX ) 17 GM/SCOOP powder Take 1 Container by mouth daily.   Yes [provider]  pregabalin  (LYRICA ) 150 MG capsule Take 150 mg by mouth 2 (two) times daily. 09/25/21  Yes [provider]  promethazine  (PHENERGAN ) 12.5 MG tablet Take 12.5 mg by mouth every 6 (six) hours as needed for vomiting or nausea. 02/15/23  Yes [provider]  traZODone  (DESYREL ) 50 MG tablet Take 50 mg by mouth at bedtime. 09/11/21  Yes [provider]  bisacodyl  (DULCOLAX) 10 MG suppository Place 1 suppository (10 mg total) rectally daily as needed for moderate constipation. Patient not taking: Reported on 07/16/2023 01/02/21   Vann, Jessica U, DO  D-[place]ose 500 MG CAPS Take by mouth daily. AZO Patient not taking: Reported on 07/16/2023    [provider]  dextromethorphan -guaiFENesin  (MUCINEX  DM) 30-600 MG 12hr tablet Take 1 tablet by mouth every 12 (twelve) hours. Patient not taking: Reported on 07/16/2023    [provider]  ELIQUIS  5 MG TABS tablet Take 5 mg by mouth every other day. Patient not taking: Reported on 07/16/2023 09/22/21   [provider]  naloxone [facility]) nasal spray 4 mg/0.1 mL Place 1 spray into the nose  once. Patient not taking: Reported on 07/16/2023    [provider]  ondansetron  (ZOFRAN -ODT) 4 MG disintegrating tablet Take 4 mg by mouth every 8 (eight) hours as needed for vomiting or nausea. Patient not taking: Reported on 07/16/2023 02/04/23   [provider]  prednisoLONE  acetate (PRED FORTE ) 1 % ophthalmic suspension Place 1 drop into the left eye daily. Patient not taking: Reported on 07/16/2023    [provider]  senna-docusate (SENOKOT-S) 8.6-50 MG tablet Take 1 tablet by mouth 2 (two) times daily. Patient not taking: Reported on 07/16/2023    [provider]  hydrochlorothiazide (HYDRODIURIL) 25 MG tablet Take 1 tablet by mouth daily. Patient not taking: Reported on 12/23/2020 12/14/16 12/24/20  [provider]    Physical Exam: Vitals:   07/16/23 0630 07/16/23 0645 07/16/23 0800 07/16/23 0830  BP: (!) 89/66 98/61 101/77 102/83  Pulse: 95 90 91 93  Resp: (!) 25 (!) 24 (!)  25 (!) 37  Temp:      TempSrc:      SpO2: 91% 100% 100% 100%  Weight:      Height:        Vitals:   07/16/23 0630 07/16/23 0645 07/16/23 0800 07/16/23 0830  BP: (!) 89/66 98/61 101/77 102/83  Pulse: 95 90 91 93  Resp: (!) 25 (!) 24 (!) 25 (!) 37  Temp:      TempSrc:      SpO2: 91% 100% 100% 100%  Weight:      Height:       General: NAD.  Flattened affect HEENT: Normocephalic, atraumatic Neck, supple, trachea midline, no tenderness Heart: Tachycardic, regular rhythm, no murmurs Lungs: Diminished at bases.  No adventitious sounds normal work of breathing.  2 L Abdomen: Obese, soft, NT/ND Extremities: Muscle wasting noted bilateral lower extremities Skin: No rashes or lesions, normal color Neurologic: Cranial nerves grossly intact, sensation intact, alert and oriented x3 Psychiatric: Flattened affect   Labs on Admission: I have personally reviewed following labs and imaging studies  CBC: Recent Labs  Lab 07/15/23 2248  WBC 14.2*  HGB 12.0  HCT  38.1  MCV 87.6  PLT 235   Basic Metabolic Panel: Recent Labs  Lab 07/15/23 2248  NA 134*  K 4.3  CL 98  CO2 22  GLUCOSE 80  BUN 16  CREATININE 1.20*  CALCIUM  8.4*   GFR: Estimated Creatinine Clearance: 74.3 mL/min (A) (by C-G formula based on SCr of 1.2 mg/dL (H)). Liver Function Tests: Recent Labs  Lab 07/15/23 2248  AST 38  ALT 31  ALKPHOS 105  BILITOT 1.4*  PROT 7.8  ALBUMIN 2.9*   No results for input(s): LIPASE, AMYLASE in the last 168 hours. No results for input(s): AMMONIA in the last 168 hours. Coagulation Profile: Recent Labs  Lab 07/16/23 0627  INR 1.2   Cardiac Enzymes: No results for input(s): CKTOTAL, CKMB, CKMBINDEX, TROPONINI in the last 168 hours. BNP (last 3 results) No results for input(s): PROBNP in the last 8760 hours. HbA1C: No results for input(s): HGBA1C in the last 72 hours. CBG: No results for input(s): GLUCAP in the last 168 hours. Lipid Profile: No results for input(s): CHOL, HDL, LDLCALC, TRIG, CHOLHDL, LDLDIRECT in the last 72 hours. Thyroid  Function Tests: No results for input(s): TSH, T4TOTAL, FREET4, T3FREE, THYROIDAB in the last 72 hours. Anemia Panel: No results for input(s): VITAMINB12, FOLATE, FERRITIN, TIBC, IRON, RETICCTPCT in the last 72 hours. Urine analysis:    Component Value Date/Time   COLORURINE AMBER (A) 07/16/2023 0244   APPEARANCEUR TURBID (A) 07/16/2023 0244   APPEARANCEUR Cloudy (A) 12/19/2022 1304   LABSPEC 1.016 07/16/2023 0244   PHURINE 5.0 07/16/2023 0244   GLUCOSEU NEGATIVE 07/16/2023 0244   HGBUR LARGE (A) 07/16/2023 0244   BILIRUBINUR NEGATIVE 07/16/2023 0244   BILIRUBINUR Negative 12/19/2022 1304   KETONESUR 20 (A) 07/16/2023 0244   PROTEINUR 100 (A) 07/16/2023 0244   NITRITE POSITIVE (A) 07/16/2023 0244   LEUKOCYTESUR LARGE (A) 07/16/2023 0244    Radiological Exams on Admission: DG Chest Port 1 View Result Date: 07/16/2023 CLINICAL  DATA:  Altered mental status.  Concern for infection. EXAM: PORTABLE CHEST 1 VIEW COMPARISON:  Radiograph 02/18/2023 FINDINGS: Chronic cardiomegaly. Unchanged mediastinal contours. Left pleural effusion. No confluent consolidation. No pneumothorax. Lower cervical spine hardware is partially included. IMPRESSION: 1. Left pleural effusion. 2. Chronic cardiomegaly. Electronically Signed   By: Andrea Gasman M.D.   On: 07/16/2023 04:12  EKG: Independently reviewed.  Sinus tachycardia  Assessment/Plan Principal Problem:   Sepsis due to gram-negative UTI [facility]) Active Problems:   Severe sepsis without septic shock (HCC)  Severe sepsis without septic shock Patient did have hypotension but this was fluid responsive.  Felt to be due to to sepsis rather than obstructive shock in setting of PE or acute blood loss.  Sepsis criteria met with tachycardia, leukocytosis and tachypnea.  Severe sepsis criteria met with AKI low-grade temperature.  Normal lactic acid fortunately.  Presumed source is urine and potentially pararenal phlegmon.  Cysts images reviewed by urology.  No surgical intervention warranted from their standpoint. Plan: Admit inpatient IV fluids Broad-spectrum IV antibiotics Follow blood and urine cultures Monitor abdominal examination Reimage if patient worsens Currently there is no drainable fluid pocket there.  If clinical situation deteriorates recommend reimage and consult of IR for consideration for image guided drain  AKI Relatively mild.  Likely in the setting of urosepsis Gentle IVF  Acute PE CTA chest reveals small linear PE extending into bilateral pulmonary arteries.  No right heart strain noted.  Patient supposedly on home apixaban .  Adherence unclear.  Could be considered treatment failure. Plan: Continue heparin  GTT for 48 hours Consider transition to to an alternative DOAC  CAD Hyperlipidemia Continue home aspirin  Continue Lipitor  Type 2 diabetes  mellitus Diabetic neuropathy Sliding scale coverage for now Accu-Cheks before meals and at bedtime Resume home Lyrica   Depression Home Cymbalta   GERD PPI  IBS Home Linzess   DVT prophylaxis: IV heparin  Code Status: Full Family Communication: None today Disposition Plan: Return to care home Consults called: None Admission status: Inpatient, progressive   Calvin KATHEE Robson MD [facility]   If 7PM-7AM, please contact night-coverage   07/16/2023, 8:46 AM

## 2023-07-17 DIAGNOSIS — N390 Urinary tract infection, site not specified: Secondary | ICD-10-CM | POA: Diagnosis not present

## 2023-07-17 DIAGNOSIS — A4150 Gram-negative sepsis, unspecified: Secondary | ICD-10-CM | POA: Diagnosis not present

## 2023-07-17 DIAGNOSIS — J9601 Acute respiratory failure with hypoxia: Secondary | ICD-10-CM | POA: Diagnosis not present

## 2023-07-17 LAB — BLOOD CULTURE ID PANEL (REFLEXED) - BCID2

## 2023-07-17 LAB — CBC WITH DIFFERENTIAL/PLATELET
Abs Immature Granulocytes: 0.05 10*3/uL (ref 0.00–0.07)
Basophils Absolute: 0.1 10*3/uL (ref 0.0–0.1)
Basophils Relative: 1 %
Eosinophils Absolute: 0.7 10*3/uL — ABNORMAL HIGH (ref 0.0–0.5)
Eosinophils Relative: 8 %
HCT: 31.1 % — ABNORMAL LOW (ref 36.0–46.0)
Hemoglobin: 9.9 g/dL — ABNORMAL LOW (ref 12.0–15.0)
Immature Granulocytes: 1 %
Lymphocytes Relative: 18 %
Lymphs Abs: 1.7 10*3/uL (ref 0.7–4.0)
MCH: 27.7 pg (ref 26.0–34.0)
MCHC: 31.8 g/dL (ref 30.0–36.0)
MCV: 87.1 fL (ref 80.0–100.0)
Monocytes Absolute: 0.8 10*3/uL (ref 0.1–1.0)
Monocytes Relative: 8 %
Neutro Abs: 6.1 10*3/uL (ref 1.7–7.7)
Neutrophils Relative %: 64 %
Platelets: 247 10*3/uL (ref 150–400)
RBC: 3.57 MIL/uL — ABNORMAL LOW (ref 3.87–5.11)
RDW: 14.9 % (ref 11.5–15.5)
WBC: 9.4 10*3/uL (ref 4.0–10.5)
nRBC: 0 % (ref 0.0–0.2)

## 2023-07-17 LAB — BASIC METABOLIC PANEL
Anion gap: 9 (ref 5–15)
BUN: 11 mg/dL (ref 6–20)
CO2: 24 mmol/L (ref 22–32)
Calcium: 8.1 mg/dL — ABNORMAL LOW (ref 8.9–10.3)
Chloride: 106 mmol/L (ref 98–111)
Creatinine, Ser: 0.9 mg/dL (ref 0.44–1.00)
GFR, Estimated: >60 mL/min (ref >60)
Glucose, Bld: 111 mg/dL — ABNORMAL HIGH (ref 70–99)
Potassium: 3.7 mmol/L (ref 3.5–5.1)
Sodium: 139 mmol/L (ref 135–145)

## 2023-07-17 LAB — APTT
aPTT: 91 s — ABNORMAL HIGH (ref 24–36)
aPTT: 74 s — ABNORMAL HIGH (ref 24–36)

## 2023-07-17 LAB — HEPARIN LEVEL (UNFRACTIONATED): Heparin Unfractionated: 0.73 [IU]/mL — ABNORMAL HIGH (ref 0.30–0.70)

## 2023-07-17 LAB — CBG MONITORING, ED
Glucose-Capillary: 99 mg/dL (ref 70–99)
Glucose-Capillary: 92 mg/dL (ref 70–99)

## 2023-07-17 LAB — GLUCOSE, CAPILLARY: Glucose-Capillary: 97 mg/dL (ref 70–99)

## 2023-07-17 MED ORDER — LINACLOTIDE 145 MCG PO CAPS
290.0000 ug | ORAL_CAPSULE | Freq: Every day | ORAL | Status: DC
  Administered 2023-07-17 – 2023-07-22 (×6): 290 ug via ORAL
  Filled 2023-07-17 (×6): qty 2

## 2023-07-17 MED ORDER — CHLORHEXIDINE GLUCONATE CLOTH 2 % EX PADS
6.0000 | MEDICATED_PAD | Freq: Every day | CUTANEOUS | Status: DC
  Administered 2023-07-17 – 2023-07-18 (×2): 6 via TOPICAL

## 2023-07-17 MED ORDER — ASPIRIN 81 MG PO TBEC
81.0000 mg | DELAYED_RELEASE_TABLET | Freq: Every day | ORAL | Status: DC
Start: 2023-07-17 — End: 2023-07-22
  Administered 2023-07-17 – 2023-07-22 (×6): 81 mg via ORAL
  Filled 2023-07-17 (×6): qty 1

## 2023-07-17 NOTE — ED Notes (Signed)
 Pt's daughter called and given update at this time per pt request.

## 2023-07-17 NOTE — Progress Notes (Signed)
 PROGRESS NOTE  Wendy Nelson    DOB: 30-Aug-1965, 58 y.o.  FMW:[id number]    Code Status: Prior   DOA: 07/16/2023   LOS: 1   Brief hospital course  Wendy Nelson is a 58 y.o. female with medical history significant of CVA, chronic quadriplegia, coronary artery disease, type 2 diabetes mellitus, hypertension who presents to the emergency department from her [facility] with [facility] for altered mentation.   Patient states that she has a dry cough and sore throat over the last several days.  Does deny abdominal pain and dysuria however has a chronic indwelling Foley catheter and history of recurrent UTIs.  She was admitted to [facility] in August 2024 for treatment of pyelonephritis and associated sepsis.   CT abdomen chest done in emergency room reveals possible phlegmon on the pole of the right kidney.  Urology consulted for comment.  Does not recommend surgical intervention at this time.  Cultures were drawn and patient started on sepsis bundle.  Patient was hypotensive in ED but did respond to fluid resuscitation.   ED Course: CT imaging survey included chest abdomen pelvis.  CT abdomen findings regarding kidney as above.  CT also revealed a thin pulmonary embolism extending into both main pulmonary arteries.  Patient on home Eliquis .  This was held and heparin  GTT was initiated.  Patient was admitted to medicine service for further workup and management of sepsis, PE as outlined in detail below.  07/17/23 -stable, improving  Assessment & Plan  Principal Problem:   Sepsis due to gram-negative UTI (HCC) Active Problems:   Severe sepsis without septic shock (HCC)  Severe sepsis without septic shock- hypotension improved. Presumed source is urine and potentially pararenal phlegmon.  Cysts images reviewed by urology.  No surgical intervention warranted from their standpoint. Urine culture multi-species Blood culture positive for GPC in aerobic bottle only - continue IV antibiotics Follow blood  cultures Monitor abdominal examination Currently there is no drainable fluid pocket there.  If clinical situation deteriorates recommend reimage and consult of IR for consideration for image guided drain Monitor fever curve - CBC Am   AKI- resolved. Baseline Cr around 0.8 and peaked at 1.2 - avoid nephrotoxins   Acute PE- on 2L [place] CTA chest reveals small linear PE extending into bilateral pulmonary arteries.  No right heart strain noted on CT or echo. She denies DOAC use and endorses that she did use aspirin .  Continue heparin  GTT for 48 hours and transition to DOAC  - wean O2 as tolerated   CAD  HLD-  Continue home aspirin  Continue Lipitor   Type 2 diabetes mellitus Diabetic neuropathy Sliding scale coverage Resume home Lyrica    Depression Home Cymbalta    GERD PPI   IBS Home Linzess   Body mass index is 50.19 kg/m.  VTE ppx: lovenox   Diet:     Diet   Diet Carb Modified Fluid consistency: Thin   Consultants: None   Subjective 07/17/23    Pt reports feeling tired. Denies home blood thinner use other than aspirin . Denies SOB currently. Has had foley cath for about 3 years she states.   Objective   Vitals:   07/17/23 0308 07/17/23 0330 07/17/23 0600 07/17/23 0800  BP:  96/73 103/78 111/85  Pulse:  73 75 72  Resp:  18 18 18   Temp: 98.2 F (36.8 C)     TempSrc:      SpO2:  100% 100% 100%  Weight:      Height:  Intake/Output Summary (Last 24 hours) at 07/17/2023 0818 Last data filed at 07/16/2023 1000 Gross per 24 hour  Intake --  Output 700 ml  Net -700 ml   Filed Weights   07/15/23 2043  Weight: (!) 136.8 kg     Physical Exam:  General: tired-appearing, alert, NAD HEENT: atraumatic, clear conjunctiva, anicteric sclera, MMM, hearing grossly normal Respiratory: normal respiratory effort. Cardiovascular: quick capillary refill, normal S1/S2, RRR, no JVD, murmurs Gastrointestinal: soft, NT, ND Nervous: A&O x3. no gross focal neurologic  deficits, normal speech Extremities: moves all equally, no edema, normal tone Skin: dry, intact, normal temperature, normal color. No rashes, lesions or ulcers on exposed skin Psychiatry: flat mood, congruent affect  Labs   I have personally reviewed the following labs and imaging studies CBC    Component Value Date/Time   WBC 9.4 07/17/2023 0635   RBC 3.57 (L) 07/17/2023 0635   HGB 9.9 (L) 07/17/2023 0635   HCT 31.1 (L) 07/17/2023 0635   PLT 247 07/17/2023 0635   MCV 87.1 07/17/2023 0635   MCH 27.7 07/17/2023 0635   MCHC 31.8 07/17/2023 0635   RDW 14.9 07/17/2023 0635   LYMPHSABS 1.7 07/17/2023 0635   MONOABS 0.8 07/17/2023 0635   EOSABS 0.7 (H) 07/17/2023 0635   BASOSABS 0.1 07/17/2023 0635      Latest Ref Rng & Units 07/17/2023    6:35 AM 07/15/2023   10:48 PM 02/19/2023    6:29 AM  BMP  Glucose 70 - 99 mg/dL 888  80  899   BUN 6 - 20 mg/dL 11  16  12    Creatinine 0.44 - 1.00 mg/dL 9.09  8.79  9.07   Sodium 135 - 145 mmol/L 139  134  139   Potassium 3.5 - 5.1 mmol/L 3.7  4.3  3.8   Chloride 98 - 111 mmol/L 106  98  103   CO2 22 - 32 mmol/L 24  22  24    Calcium  8.9 - 10.3 mg/dL 8.1  8.4  8.4     CT CHEST ABDOMEN PELVIS W CONTRAST Result Date: 07/16/2023 CLINICAL DATA:  Sepsis. EXAM: CT CHEST, ABDOMEN, AND PELVIS WITH CONTRAST TECHNIQUE: Multidetector CT imaging of the chest, abdomen and pelvis was performed following the standard protocol during bolus administration of intravenous contrast. RADIATION DOSE REDUCTION: This exam was performed according to the departmental dose-optimization program which includes automated exposure control, adjustment of the mA and/or kV according to patient size and/or use of iterative reconstruction technique. CONTRAST:  OMNIPAQUE  IOHEXOL  350 MG/ML SOLN COMPARISON:  02/18/2023 FINDINGS: CT CHEST FINDINGS Cardiovascular: The heart is enlarged. No substantial pericardial effusion. No thoracic aortic aneurysm. No substantial atherosclerosis  of the thoracic aorta. A relatively thin long pulmonary embolus is identified in both main pulmonary arteries extending into the upper lobes bilaterally. This is best appreciated on coronal images 79-85 of series 5. Thrombus in the left main pulmonary artery is visible on axial images 20 and 21 of series 2. No evidence for right heart strain with RV/LV ratio of 0.78. Mediastinum/Nodes: 2.2 cm left thyroid  nodule evident. This has been evaluated on previous imaging. (ref: J Am Coll Radiol. 2015 Feb;12(2): 143-50).No mediastinal lymphadenopathy. There is no hilar lymphadenopathy. The esophagus has normal imaging features. There is no axillary lymphadenopathy. Lungs/Pleura: Bandlike subpleural scarring in both lungs similar to prior. No focal consolidative airspace opacity. Chronic atelectasis or scarring in the posterior left base is stable. No substantial pleural effusion. Musculoskeletal: No worrisome lytic or sclerotic osseous  abnormality. CT ABDOMEN PELVIS FINDINGS Hepatobiliary: No suspicious focal abnormality within the liver parenchyma. Subtle high density along the dependent mucosa of the gallbladder lumen may be related to tiny stones. See image 66/2). No intrahepatic or extrahepatic biliary dilation. Pancreas: No focal mass lesion. No dilatation of the main duct. No intraparenchymal cyst. No peripancreatic edema. Spleen: No splenomegaly. No suspicious focal mass lesion. Adrenals/Urinary Tract: No adrenal nodule or mass. A focal 2.4 cm area of abnormal perfusion is identified in the lower pole right kidney. This is probably related to segmental edema from pyelonephritis as similar changes are noted in the upper pole of the right kidney in these findings are new since the study of 02/18/2023. The lower pole abnormality does have a masslike character (see delayed image 22/7) and evolving intrarenal phlegmon could have this appearance. The mild right hydronephrosis is similar to prior study with mucosal  hyperenhancement in the region of the UPJ and diffusely in the right ureter. Left kidney and ureter unremarkable. Bladder is decompressed by Foley catheter and gas in the bladder lumen is compatible with the instrumentation. Stomach/Bowel: Stomach is unremarkable. No gastric wall thickening. No evidence of outlet obstruction. Duodenum is normally positioned as is the ligament of Treitz. No small bowel wall thickening. No small bowel dilatation. The terminal ileum is normal. The appendix is normal. No gross colonic mass. No colonic wall thickening. Vascular/Lymphatic: There is mild atherosclerotic calcification of the abdominal aorta without aneurysm. There is no gastrohepatic or hepatoduodenal ligament lymphadenopathy. No retroperitoneal or mesenteric lymphadenopathy. No pelvic sidewall lymphadenopathy. Reproductive: There is no adnexal mass.  Uterus unremarkable. Other: No intraperitoneal free fluid. Musculoskeletal: No worrisome lytic or sclerotic osseous abnormality. IMPRESSION: 1. Relatively thin acute pulmonary embolus in both main pulmonary arteries extending into the upper lobes bilaterally. No evidence for right heart strain. 2. Focal 2.4 cm area of abnormal perfusion in the lower pole right kidney. This is probably related to segmental edema from pyelonephritis as similar changes are noted in the upper pole of the right kidney in these findings are new since the study of 02/18/2023. The lower pole abnormality does have a masslike character and evolving intrarenal phlegmon could have this appearance. Close follow-up recommended to exclude mass lesion. 3. Mild right hydronephrosis is similar to prior study with mucosal hyperenhancement in the region of the UPJ and diffusely in the right ureter. Imaging features compatible with ascending urinary tract infection. 4. Subtle high density along the dependent mucosa of the gallbladder lumen may be related to tiny stones. 5.  Aortic Atherosclerosis (ICD10-I70.0).  Critical Value/emergent results were called by telephone at the time of interpretation on 07/16/2023 at 6:06 am to provider [facility] , who verbally acknowledged these results. Electronically Signed   By: Camellia Candle M.D.   On: 07/16/2023 06:08   DG Chest Port 1 View Result Date: 07/16/2023 CLINICAL DATA:  Altered mental status.  Concern for infection. EXAM: PORTABLE CHEST 1 VIEW COMPARISON:  Radiograph 02/18/2023 FINDINGS: Chronic cardiomegaly. Unchanged mediastinal contours. Left pleural effusion. No confluent consolidation. No pneumothorax. Lower cervical spine hardware is partially included. IMPRESSION: 1. Left pleural effusion. 2. Chronic cardiomegaly. Electronically Signed   By: Andrea Gasman M.D.   On: 07/16/2023 04:12    Disposition Plan & Communication  Patient status: Inpatient  Admitted From: Home Planned disposition location: Skilled nursing facility Anticipated discharge date: 1/4 pending clinical improvement   Family Communication: none at bedside    Author: Marien LITTIE Piety, DO [facility] 07/17/2023, 8:18 AM  Available by Epic secure chat 7AM-7PM. If 7PM-7AM, please contact night-coverage.  [facility] contact information found on [email or web address].

## 2023-07-17 NOTE — Progress Notes (Signed)
 PHARMACY - ANTICOAGULATION CONSULT NOTE  Pharmacy Consult for Heparin   Indication: DVT  Allergies  Allergen Reactions   Melatonin    Misc. Sulfonamide Containing Compounds    Neomycin Other (See Comments)    Positive patch test   Sulfa Antibiotics Itching   Patient Measurements: Height: 5' 5 (165.1 cm) Weight: (!) 136.8 kg (301 lb 9.4 oz) IBW/kg (Calculated) : 57 Heparin  Dosing Weight: 90.9 kg   Vital Signs: Temp: 98.2 F (36.8 C) (01/01 0308) Temp Source: Oral (12/31 2237) BP: 103/78 (01/01 0600) Pulse Rate: 75 (01/01 0600)  Labs: Recent Labs    07/15/23 2248 07/16/23 0627 07/16/23 0627 07/16/23 1408 07/16/23 2231 07/17/23 0635  HGB 12.0  --   --   --   --  9.9*  HCT 38.1  --   --   --   --  31.1*  PLT 235  --   --   --   --  247  APTT  --  28   < > 56* 56* 91*  LABPROT  --  15.6*  --   --   --   --   INR  --  1.2  --   --   --   --   HEPARINUNFRC  --  <0.10*  --   --   --  0.73*  CREATININE 1.20*  --   --   --   --  0.90   < > = values in this interval not displayed.   Estimated Creatinine Clearance: 99.1 mL/min (by C-G formula based on SCr of 0.9 mg/dL).  Medical History: Past Medical History:  Diagnosis Date   Quadriplegia [facility])    Stroke (HCC) 09/2020   Medications:  Patient on Eliquis  5 mg BID PTA for DVT ppx.  Last dose unsure but will assume evening of 12/30 Last fill on file was 02/15/2023  Assessment: SD is a 58 year old female who presented to the ED with altered mental status from her care home. Admitted for severe sepsis with septic shock. Taking apixaban  at home for DVT, unclear adherence, could be non-compliance vs treatment failure. Continue heparin  drip for 48 hours then transition to possibly alternative DOAC if considered treatment failure. Unclear at this time, but will continue to follow. Pharmacy was consulted to dose and manage heparin . Heparin  started at 12/31@0700  monitor to end 1/2@0700 .   Goal of Therapy:  aPTT 66 - 102  seconds HL 0.3 - 0.7 Monitor platelets by anticoagulation protocol: Yes  Heparin  Level:  Dose HL/aPTT Clinical Assessment  12/31@0627  aPTT = 28 HL = > 1.10 Subtherapeutic   12/31@1408  aPTT = 56 Subtherapeutic   12/31@2231  aPTT = 56 Subtherapeutic  1/1@0635  aPTT = 91, HL = 0.73 Therapeutic X 1   Plan:  01/01 @ 0635:  aPTT = 91, HL = 0.73 - aPTT therapeutic X 1,  HL still elevated from PTA Eliquis  - will continue pt on current rate and draw confirmation aPTT in 6 hrs - will recheck HL on 1/02 with AM labs Monitor CBC with AM labs  Kavir Savoca D, PharmD 07/17/2023 7:06 AM

## 2023-07-17 NOTE — Progress Notes (Signed)
 PHARMACY - PHYSICIAN COMMUNICATION CRITICAL VALUE ALERT - BLOOD CULTURE IDENTIFICATION (BCID)  Wendy Nelson is an 58 y.o. female who presented to [facility] on 07/16/2023 with a chief complaint of sepsis/UTI from gram neg organism.   Assessment:  Staph species in 1 of 2 bottles, no resistance detected.  Most likely a contaminant.  (include suspected source if known)  Name of physician (or Provider) Contacted: Modou Jawo, NP   Current antibiotics: Zosyn  3.375 gm IV Q8H   Changes to prescribed antibiotics recommended:  Patient is on recommended antibiotics - No changes needed  No results found for this or any previous visit.  Melton Walls D 07/17/2023  1:30 AM

## 2023-07-17 NOTE — Progress Notes (Signed)
 PHARMACY - ANTICOAGULATION CONSULT NOTE  Pharmacy Consult for Heparin   Indication: DVT  Allergies  Allergen Reactions   Melatonin    Misc. Sulfonamide Containing Compounds    Neomycin Other (See Comments)    Positive patch test   Sulfa Antibiotics Itching   Patient Measurements: Height: 5' 5 (165.1 cm) Weight: (!) 136.8 kg (301 lb 9.4 oz) IBW/kg (Calculated) : 57 Heparin  Dosing Weight: 90.9 kg   Vital Signs: Temp: 97.5 F (36.4 C) (01/01 1146) Temp Source: Oral (01/01 1146) BP: 104/58 (01/01 1230) Pulse Rate: 88 (01/01 1230)  Labs: Recent Labs    07/15/23 2248 07/16/23 0627 07/16/23 1408 07/16/23 2231 07/17/23 0635 07/17/23 1317  HGB 12.0  --   --   --  9.9*  --   HCT 38.1  --   --   --  31.1*  --   PLT 235  --   --   --  247  --   APTT  --  28   < > 56* 91* 74*  LABPROT  --  15.6*  --   --   --   --   INR  --  1.2  --   --   --   --   HEPARINUNFRC  --  <0.10*  --   --  0.73*  --   CREATININE 1.20*  --   --   --  0.90  --    < > = values in this interval not displayed.   Estimated Creatinine Clearance: 99.1 mL/min (by C-G formula based on SCr of 0.9 mg/dL).  Medical History: Past Medical History:  Diagnosis Date   Quadriplegia [facility])    Stroke (HCC) 09/2020   Medications:  Patient on Eliquis  5 mg BID PTA for DVT ppx.  Last dose unsure but will assume evening of 12/30 Last fill on file was 02/15/2023  Assessment: SD is a 58 year old female who presented to the ED with altered mental status from her care home. Admitted for severe sepsis with septic shock. Taking apixaban  at home for DVT, unclear adherence, could be non-compliance vs treatment failure. Continue heparin  drip for 48 hours then transition to possibly alternative DOAC if considered treatment failure. Unclear at this time, but will continue to follow. Pharmacy was consulted to dose and manage heparin . Heparin  started at 12/31@0700  monitor to end 1/2@0700 .   Goal of Therapy:  aPTT 66 - 102  seconds HL 0.3 - 0.7 Monitor platelets by anticoagulation protocol: Yes  Heparin  Level:  Dose HL/aPTT Clinical Assessment  12/31@0627  aPTT = 28 HL = > 1.10 Subtherapeutic   12/31@1408  aPTT = 56 Subtherapeutic   12/31@2231  aPTT = 56 Subtherapeutic  1/1@0635  aPTT = 91, HL = 0.73 Therapeutic X 1  1/1@1317  aPTT = 74 Therapeutic X 2   Plan:  - aPTT therapeutic X 2 - Will continue pt on current rate and recheck both aPTT and HL with AM labs on 1/02 - Continue to dose via aPTT until both levels correlate, then will transition to HL dosing - Monitor CBC with AM labs  Wendy Nelson, PharmD 07/17/2023 2:18 PM

## 2023-07-18 ENCOUNTER — Other Ambulatory Visit (HOSPITAL_COMMUNITY): Payer: Self-pay

## 2023-07-18 ENCOUNTER — Telehealth (HOSPITAL_COMMUNITY): Payer: Self-pay | Admitting: Pharmacy Technician

## 2023-07-18 DIAGNOSIS — A4150 Gram-negative sepsis, unspecified: Secondary | ICD-10-CM | POA: Diagnosis not present

## 2023-07-18 DIAGNOSIS — J9601 Acute respiratory failure with hypoxia: Secondary | ICD-10-CM | POA: Diagnosis not present

## 2023-07-18 DIAGNOSIS — N390 Urinary tract infection, site not specified: Secondary | ICD-10-CM | POA: Diagnosis not present

## 2023-07-18 LAB — CBC
HCT: 29.8 % — ABNORMAL LOW (ref 36.0–46.0)
Hemoglobin: 9.6 g/dL — ABNORMAL LOW (ref 12.0–15.0)
MCH: 27.2 pg (ref 26.0–34.0)
MCHC: 32.2 g/dL (ref 30.0–36.0)
MCV: 84.4 fL (ref 80.0–100.0)
Platelets: 267 10*3/uL (ref 150–400)
RBC: 3.53 MIL/uL — ABNORMAL LOW (ref 3.87–5.11)
RDW: 15.1 % (ref 11.5–15.5)
WBC: 7.6 10*3/uL (ref 4.0–10.5)
nRBC: 0 % (ref 0.0–0.2)

## 2023-07-18 LAB — BASIC METABOLIC PANEL
Anion gap: 11 (ref 5–15)
BUN: 12 mg/dL (ref 6–20)
CO2: 24 mmol/L (ref 22–32)
Calcium: 8 mg/dL — ABNORMAL LOW (ref 8.9–10.3)
Chloride: 104 mmol/L (ref 98–111)
Creatinine, Ser: 0.85 mg/dL (ref 0.44–1.00)
GFR, Estimated: >60 mL/min (ref >60)
Glucose, Bld: 105 mg/dL — ABNORMAL HIGH (ref 70–99)
Potassium: 3.4 mmol/L — ABNORMAL LOW (ref 3.5–5.1)
Sodium: 139 mmol/L (ref 135–145)

## 2023-07-18 LAB — APTT: aPTT: 90 s — ABNORMAL HIGH (ref 24–36)

## 2023-07-18 LAB — GLUCOSE, CAPILLARY
Glucose-Capillary: 87 mg/dL (ref 70–99)
Glucose-Capillary: 128 mg/dL — ABNORMAL HIGH (ref 70–99)
Glucose-Capillary: 90 mg/dL (ref 70–99)

## 2023-07-18 LAB — HEPARIN LEVEL (UNFRACTIONATED): Heparin Unfractionated: 0.54 [IU]/mL (ref 0.30–0.70)

## 2023-07-18 MED ORDER — CHLORHEXIDINE GLUCONATE CLOTH 2 % EX PADS
6.0000 | MEDICATED_PAD | Freq: Every day | CUTANEOUS | Status: DC
  Administered 2023-07-20 – 2023-07-22 (×3): 6 via TOPICAL

## 2023-07-18 MED ORDER — APIXABAN 5 MG PO TABS
10.0000 mg | ORAL_TABLET | Freq: Two times a day (BID) | ORAL | Status: DC
  Administered 2023-07-18 – 2023-07-22 (×9): 10 mg via ORAL
  Filled 2023-07-18 (×9): qty 2

## 2023-07-18 MED ORDER — MUPIROCIN 2 % EX OINT
1.0000 | TOPICAL_OINTMENT | Freq: Two times a day (BID) | CUTANEOUS | Status: DC
Start: 2023-07-19 — End: 2023-07-22
  Administered 2023-07-19 – 2023-07-22 (×7): 1 via NASAL
  Filled 2023-07-18: qty 22

## 2023-07-18 MED ORDER — APIXABAN 5 MG PO TABS: 5.0000 mg | ORAL_TABLET | Freq: Two times a day (BID) | ORAL | Status: DC

## 2023-07-18 NOTE — Progress Notes (Signed)
 Pt is experiencing loose stools, hospitalist notified of condition. Upon assessment of pt, this nurse noted that pt has ability to move upper extremities with decreased function on right side. Pt is able to move right arm against gravity but unable to grasp with hand. Left arm is fully functional and both upper extremities have full sensation. 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

## 2023-07-18 NOTE — Telephone Encounter (Signed)
 Patient Product/process development scientist completed.    The patient is insured through [facility]. Patient has Medicare and is not eligible for a copay card, but may be able to apply for patient assistance, if available.    Ran test claim for Xarelto 20 mg and the current 30 day co-pay is $0.00.   This test claim was processed through [facility]- copay amounts may vary at other pharmacies due to pharmacy/plan contracts, or as the patient moves through the different stages of their insurance plan.     Roland Earl, CPHT Pharmacy Technician III Certified Patient Advocate [facility] Pharmacy Patient Advocate Team Direct Number: [phone]  Fax: [phone]

## 2023-07-18 NOTE — Plan of Care (Signed)

## 2023-07-18 NOTE — Progress Notes (Signed)
 PHARMACY - ANTICOAGULATION CONSULT NOTE  Pharmacy Consult for Heparin   Indication: DVT  Allergies  Allergen Reactions   Melatonin    Misc. Sulfonamide Containing Compounds    Neomycin Other (See Comments)    Positive patch test   Sulfa Antibiotics Itching   Patient Measurements: Height: 5' 5 (165.1 cm) Weight: (!) 136.8 kg (301 lb 9.4 oz) IBW/kg (Calculated) : 57 Heparin  Dosing Weight: 90.9 kg   Vital Signs: Temp: 97.4 F (36.3 C) (01/02 0127) BP: 125/79 (01/02 0127) Pulse Rate: 77 (01/02 0127)  Labs: Recent Labs    07/15/23 2248 07/16/23 0627 07/16/23 1408 07/17/23 0635 07/17/23 1317 07/18/23 0400  HGB 12.0  --   --  9.9*  --  9.6*  HCT 38.1  --   --  31.1*  --  29.8*  PLT 235  --   --  247  --  267  APTT  --  28   < > 91* 74* 90*  LABPROT  --  15.6*  --   --   --   --   INR  --  1.2  --   --   --   --   HEPARINUNFRC  --  <0.10*  --  0.73*  --  0.54  CREATININE 1.20*  --   --  0.90  --  0.85   < > = values in this interval not displayed.   Estimated Creatinine Clearance: 105 mL/min (by C-G formula based on SCr of 0.85 mg/dL).  Medical History: Past Medical History:  Diagnosis Date   Quadriplegia [facility])    Stroke (HCC) 09/2020   Medications:  Patient on Eliquis  5 mg BID PTA for DVT ppx.  Last dose unsure but will assume evening of 12/30 Last fill on file was 02/15/2023  Assessment: SD is a 58 year old female who presented to the ED with altered mental status from her care home. Admitted for severe sepsis with septic shock. Taking apixaban  at home for DVT, unclear adherence, could be non-compliance vs treatment failure. Continue heparin  drip for 48 hours then transition to possibly alternative DOAC if considered treatment failure. Unclear at this time, but will continue to follow. Pharmacy was consulted to dose and manage heparin . Heparin  started at 12/31@0700  monitor to end 1/2@0700 .   Goal of Therapy:  aPTT 66 - 102 seconds HL 0.3 - 0.7 Monitor platelets  by anticoagulation protocol: Yes  Heparin  Level:  Dose HL/aPTT Clinical Assessment  12/31@0627  aPTT = 28 HL = > 1.10 Subtherapeutic   12/31@1408  aPTT = 56 Subtherapeutic   12/31@2231  aPTT = 56 Subtherapeutic  1/1@0635  aPTT = 91, HL = 0.73 Therapeutic X 1  1/1@1317  aPTT = 74 Therapeutic X 2  1/2@0400  aPTT=90, HL =0.54 Therapeutic X 3   Plan:  1/2 @ 0400:  aPTT = 90,  HL = 0.54 - aPTT therapeutic X 3 , now correlating with HL  - will use HL to guide dosing from here on - will continue pt on current rate and recheck HL on 1/3 with AM labs.  - Monitor CBC with AM labs  Kelon Easom D, PharmD 07/18/2023 5:27 AM

## 2023-07-18 NOTE — Progress Notes (Signed)
 PROGRESS NOTE  Wendy Nelson    DOB: 1966/02/24, 58 y.o.  FMW:[id number]    Code Status: Prior   DOA: 07/16/2023   LOS: 2   Brief hospital course  Wendy Nelson is a 58 y.o. female with medical history significant of CVA, chronic quadriplegia, coronary artery disease, type 2 diabetes mellitus, hypertension who presents to the emergency department from her care home with [facility] for altered mentation.   Patient states that she has a dry cough and sore throat over the last several days.  Does deny abdominal pain and dysuria however has a chronic indwelling Foley catheter and history of recurrent UTIs.  She was admitted to [facility] in August 2024 for treatment of pyelonephritis and associated sepsis.   CT abdomen chest done in emergency room reveals possible phlegmon on the pole of the right kidney.  Urology consulted for comment.  Does not recommend surgical intervention at this time.  Cultures were drawn and patient started on sepsis bundle.  Patient was hypotensive in ED but did respond to fluid resuscitation.   ED Course: CT imaging survey included chest abdomen pelvis.  CT abdomen findings regarding kidney as above.  CT also revealed a thin pulmonary embolism extending into both main pulmonary arteries.  Patient on home Eliquis .  This was held and heparin  GTT was initiated.  Patient was admitted to medicine service for further workup and management of sepsis, PE as outlined in detail below.  07/18/23 -stable, improving  Assessment & Plan  Principal Problem:   Sepsis due to gram-negative UTI (HCC) Active Problems:   Severe sepsis without septic shock (HCC)  Severe sepsis without septic shock- hypotension improved. Presumed source is urine and potentially pararenal phlegmon.  Cysts images reviewed by urology.  No surgical intervention warranted from their standpoint. Urine culture multi-species Blood culture positive for staph in aerobic bottle only - continue IV antibiotics Follow blood  cultures- staph Monitor abdominal examination Currently there is no drainable fluid pocket there.  If clinical situation deteriorates recommend reimage and consult of IR for consideration for image guided drain Monitor fever curve   AKI- resolved. Baseline Cr around 0.8 and peaked at 1.2 - avoid nephrotoxins   Acute PE- on 2L  CTA chest reveals small linear PE extending into bilateral pulmonary arteries.  No right heart strain noted on CT or echo. She denies DOAC use and endorses that she did use aspirin .  - transition to eliquis  today  - wean O2 as tolerated   CAD  HLD-  Continue home aspirin  Continue Lipitor   Type 2 diabetes mellitus Diabetic neuropathy Sliding scale coverage Resume home Lyrica    Depression Home Cymbalta    GERD PPI   IBS Home Linzess   Body mass index is 50.19 kg/m.  VTE ppx: heparin  > eliquis   Diet:     Diet   Diet Carb Modified Fluid consistency: Thin   Consultants: None   Subjective 07/18/23    Pt reports feeling well today. Denies SOB or chest pain at rest.    Objective   Vitals:   07/17/23 1200 07/17/23 1230 07/17/23 1458 07/18/23 0127  BP: (!) 115/93 (!) 104/58 92/71 125/79  Pulse: 92 88 84 77  Resp: 20 20 18 20   Temp:   (!) 97.3 F (36.3 C) (!) 97.4 F (36.3 C)  TempSrc:      SpO2: 100% 100% 100% 100%  Weight:      Height:        Intake/Output Summary (Last 24 hours) at 07/18/2023  0715 Last data filed at 07/18/2023 0509 Gross per 24 hour  Intake 1086.09 ml  Output --  Net 1086.09 ml   Filed Weights   07/15/23 2043  Weight: (!) 136.8 kg    Physical Exam:  General: alert, NAD HEENT: atraumatic, clear conjunctiva, anicteric sclera, MMM, hearing grossly normal Respiratory: normal respiratory effort. Cardiovascular: quick capillary refill, normal S1/S2, RRR, no JVD, murmurs Gastrointestinal: soft, NT, ND Nervous: A&O x3. no gross focal neurologic deficits, normal speech Extremities: no edema Skin: dry, intact,  normal temperature, normal color. No rashes, lesions or ulcers on exposed skin Psychiatry: normal mood, congruent affect  Labs   I have personally reviewed the following labs and imaging studies CBC    Component Value Date/Time   WBC 7.6 07/18/2023 0400   RBC 3.53 (L) 07/18/2023 0400   HGB 9.6 (L) 07/18/2023 0400   HCT 29.8 (L) 07/18/2023 0400   PLT 267 07/18/2023 0400   MCV 84.4 07/18/2023 0400   MCH 27.2 07/18/2023 0400   MCHC 32.2 07/18/2023 0400   RDW 15.1 07/18/2023 0400   LYMPHSABS 1.7 07/17/2023 0635   MONOABS 0.8 07/17/2023 0635   EOSABS 0.7 (H) 07/17/2023 0635   BASOSABS 0.1 07/17/2023 0635      Latest Ref Rng & Units 07/18/2023    4:00 AM 07/17/2023    6:35 AM 07/15/2023   10:48 PM  BMP  Glucose 70 - 99 mg/dL 894  888  80   BUN 6 - 20 mg/dL 12  11  16    Creatinine 0.44 - 1.00 mg/dL 9.14  9.09  8.79   Sodium 135 - 145 mmol/L 139  139  134   Potassium 3.5 - 5.1 mmol/L 3.4  3.7  4.3   Chloride 98 - 111 mmol/L 104  106  98   CO2 22 - 32 mmol/L 24  24  22    Calcium  8.9 - 10.3 mg/dL 8.0  8.1  8.4     No results found.   Disposition Plan & Communication  Patient status: Inpatient  Admitted From: Home Planned disposition location: Skilled nursing facility Anticipated discharge date: 1/4 pending SNF placement  Family Communication: none at bedside    Author: Marien LITTIE Piety, DO [facility] 07/18/2023, 7:15 AM   Available by Epic secure chat 7AM-7PM. If 7PM-7AM, please contact night-coverage.  [facility] contact information found on [email or web address].

## 2023-07-18 NOTE — Progress Notes (Signed)
 PHARMACY - ANTICOAGULATION CONSULT NOTE  Pharmacy Consult for apixaban  Indication:  VTE treatment  Allergies  Allergen Reactions   Melatonin    Misc. Sulfonamide Containing Compounds    Neomycin Other (See Comments)    Positive patch test   Sulfa Antibiotics Itching    Patient Measurements: Height: 5' 5 (165.1 cm) Weight: (!) 136.8 kg (301 lb 9.4 oz) IBW/kg (Calculated) : 57 Heparin  Dosing Weight:    Vital Signs: Temp: 97.5 F (36.4 C) (01/02 0811) Temp Source: Oral (01/02 0811) BP: 93/70 (01/02 0811) Pulse Rate: 74 (01/02 0811)  Labs: Recent Labs    07/15/23 2248 07/16/23 0627 07/16/23 1408 07/17/23 0635 07/17/23 1317 07/18/23 0400  HGB 12.0  --   --  9.9*  --  9.6*  HCT 38.1  --   --  31.1*  --  29.8*  PLT 235  --   --  247  --  267  APTT  --  28   < > 91* 74* 90*  LABPROT  --  15.6*  --   --   --   --   INR  --  1.2  --   --   --   --   HEPARINUNFRC  --  <0.10*  --  0.73*  --  0.54  CREATININE 1.20*  --   --  0.90  --  0.85   < > = values in this interval not displayed.    Estimated Creatinine Clearance: 105 mL/min (by C-G formula based on SCr of 0.85 mg/dL).   Medical History: Past Medical History:  Diagnosis Date   Quadriplegia (HCC)    Stroke (HCC) 09/2020    Medications:  Medications Prior to Admission  Medication Sig Dispense Refill Last Dose/Taking   Acetaminophen  500 MG capsule Take 1,000 mg by mouth every 6 (six) hours as needed.   06/18/2023   Artificial Saliva (ORAL RELIEF FOR DRY MOUTH) LOZG 1 lozenge by Transmucosal route as needed (dry mouth).   Taking As Needed   aspirin  81 MG chewable tablet Chew 81 mg by mouth daily.   07/15/2023 at  9:39 AM   atorvastatin  (LIPITOR) 10 MG tablet Take 10 mg by mouth daily.   07/15/2023 at  6:21 PM   Baclofen  5 MG TABS Take 1 tablet by mouth 3 (three) times daily.   07/15/2023 at  1:27 PM   benzonatate  (TESSALON ) 100 MG capsule Take 100 mg by mouth daily as needed for cough.   06/28/2023   CRANBERRY  PO Take 450 mg by mouth 2 (two) times daily.   07/15/2023 at  6:21 PM   DULoxetine  (CYMBALTA ) 30 MG capsule Take 30 mg by mouth daily.   07/15/2023 at  6:21 PM   DULoxetine  (CYMBALTA ) 60 MG capsule Take 60 mg by mouth daily.   07/15/2023 at  6:21 PM   Emollient (DERMAPHOR) OINT Apply 1 application. topically daily. To feet.   07/15/2023 at  9:39 AM   Ergocalciferol 50 MCG (2000 UT) TABS Take 2 tablets by mouth daily.   07/15/2023 at  9:40 AM   fluticasone  (FLONASE ) 50 MCG/ACT nasal spray Place 1 spray into both nostrils daily.   07/15/2023 at  9:40 AM   lidocaine  (LMX) 4 % cream Apply 1 Application topically daily. Apply to left shoulder.   07/15/2023 at  9:40 AM   LINZESS  290 MCG CAPS capsule Take 290 mcg by mouth daily before breakfast.   07/15/2023 at  9:39 AM   magnesium chloride (SLOW-MAG) 64  MG TBEC SR tablet Take 1 tablet by mouth daily.   07/15/2023 at  9:40 AM   meclizine  (ANTIVERT ) 25 MG tablet Take 25 mg by mouth daily as needed.   Taking As Needed   metFORMIN  (GLUCOPHAGE ) 500 MG tablet Take 500 mg by mouth 2 (two) times daily with a meal.   07/15/2023 at  6:21 PM   Multiple Vitamin (MULTIVITAMIN) tablet Take 1 tablet by mouth daily.   07/15/2023 at  9:40 AM   NYAMYC  powder Apply 1 Application topically 2 (two) times daily. To groin and abdominal folds   07/15/2023 at  2:25 PM   omeprazole (PRILOSEC) 40 MG capsule Take 40 mg by mouth daily.   07/15/2023 at  5:02 AM   Polyethylene Glycol 400 (VISINE DRY EYE RELIEF) 1 % SOLN Place 2 drops into both eyes 2 (two) times daily.   07/15/2023 at  6:21 PM   polyethylene glycol powder (GLYCOLAX /MIRALAX ) 17 GM/SCOOP powder Take 1 Container by mouth daily.   07/15/2023 at  9:40 AM   pregabalin  (LYRICA ) 150 MG capsule Take 150 mg by mouth 2 (two) times daily.   07/15/2023 at  9:40 AM   promethazine  (PHENERGAN ) 12.5 MG tablet Take 12.5 mg by mouth every 6 (six) hours as needed for vomiting or nausea.   Taking As Needed   traZODone  (DESYREL ) 50 MG  tablet Take 50 mg by mouth at bedtime.   Taking   bisacodyl  (DULCOLAX) 10 MG suppository Place 1 suppository (10 mg total) rectally daily as needed for moderate constipation. (Patient not taking: Reported on 07/16/2023) 12 suppository 0 Not Taking   D-Mannose 500 MG CAPS Take by mouth daily. AZO (Patient not taking: Reported on 07/16/2023)   Not Taking   dextromethorphan -guaiFENesin  (MUCINEX  DM) 30-600 MG 12hr tablet Take 1 tablet by mouth every 12 (twelve) hours. (Patient not taking: Reported on 07/16/2023)   Not Taking   ELIQUIS  5 MG TABS tablet Take 5 mg by mouth every other day. (Patient not taking: Reported on 07/16/2023)   Not Taking   naloxone (NARCAN) nasal spray 4 mg/0.1 mL Place 1 spray into the nose once. (Patient not taking: Reported on 07/16/2023)   Not Taking   ondansetron  (ZOFRAN -ODT) 4 MG disintegrating tablet Take 4 mg by mouth every 8 (eight) hours as needed for vomiting or nausea. (Patient not taking: Reported on 07/16/2023)   Not Taking   prednisoLONE  acetate (PRED FORTE ) 1 % ophthalmic suspension Place 1 drop into the left eye daily. (Patient not taking: Reported on 07/16/2023)   Not Taking   senna-docusate (SENOKOT-S) 8.6-50 MG tablet Take 1 tablet by mouth 2 (two) times daily. (Patient not taking: Reported on 07/16/2023)   Not Taking   Scheduled:   apixaban   10 mg Oral BID   Followed by   NOREEN ON 07/25/2023] apixaban   5 mg Oral BID   aspirin  EC  81 mg Oral Daily   atorvastatin   10 mg Oral Daily   baclofen   5 mg Oral TID   Chlorhexidine  Gluconate Cloth  6 each Topical Daily   DULoxetine   30 mg Oral Daily   DULoxetine   60 mg Oral Daily   fluticasone   1 spray Each Nare Daily   insulin  aspart  0-9 Units Subcutaneous TID WC   linaclotide   290 mcg Oral QAC breakfast   pantoprazole   40 mg Oral Daily   pregabalin   150 mg Oral BID   traZODone   50 mg Oral QHS   Infusions:   piperacillin -tazobactam (ZOSYN )  IV 3.375 g (07/18/23 0859)   PRN: acetaminophen , benzonatate ,  dextrose , ondansetron  (ZOFRAN ) IV, promethazine   Assessment: Wendy Nelson is a 58 year old female who presented to the ED with altered mental status from her care home. Admitted for severe sepsis with septic shock. Taking apixaban  at home for DVT, unclear adherence, could be non-compliance vs treatment failure.  - Per MD note 07/16/22: She denies DOAC use and endorses that she did use aspirin . .Continue heparin  GTT for 48 hours and transition to DOAC  -Now transitioning from heparin  drip to treatment dose apixaban   Goal of Therapy:  Monitor platelets by anticoagulation protocol: Yes   Plan:  Will start apixaban  10mg  po BID x 7 days followed by 5 mg po BID. F/u CBC per protocol  Allean Haas PharmD Clinical Pharmacist 07/18/2023

## 2023-07-18 NOTE — Plan of Care (Signed)
  Problem: Fluid Volume: Goal: Ability to maintain a balanced intake and output will improve Outcome: Progressing   Problem: Metabolic: Goal: Ability to maintain appropriate glucose levels will improve Outcome: Progressing   Problem: Nutritional: Goal: Maintenance of adequate nutrition will improve Outcome: Progressing   Problem: Skin Integrity: Goal: Risk for impaired skin integrity will decrease Outcome: Progressing   Problem: Coping: Goal: Level of anxiety will decrease Outcome: Progressing   Problem: Elimination: Goal: Will not experience complications related to bowel motility Outcome: Progressing Goal: Will not experience complications related to urinary retention Outcome: Progressing   Problem: Pain Management: Goal: General experience of comfort will improve Outcome: Progressing   Problem: Safety: Goal: Ability to remain free from injury will improve Outcome: Progressing

## 2023-07-19 DIAGNOSIS — N390 Urinary tract infection, site not specified: Secondary | ICD-10-CM | POA: Diagnosis not present

## 2023-07-19 DIAGNOSIS — J9601 Acute respiratory failure with hypoxia: Secondary | ICD-10-CM | POA: Diagnosis not present

## 2023-07-19 DIAGNOSIS — A4150 Gram-negative sepsis, unspecified: Secondary | ICD-10-CM | POA: Diagnosis not present

## 2023-07-19 LAB — GLUCOSE, CAPILLARY
Glucose-Capillary: 80 mg/dL (ref 70–99)
Glucose-Capillary: 103 mg/dL — ABNORMAL HIGH (ref 70–99)
Glucose-Capillary: 86 mg/dL (ref 70–99)
Glucose-Capillary: 135 mg/dL — ABNORMAL HIGH (ref 70–99)

## 2023-07-19 NOTE — Progress Notes (Signed)
 PROGRESS NOTE  Wendy Nelson    DOB: Oct 27, 1965, 58 y.o.  FMW:[id number]    Code Status: Prior   DOA: 07/16/2023   LOS: 3   Brief hospital course  Wendy Nelson is a 58 y.o. female with medical history significant of CVA, chronic quadriplegia, coronary artery disease, type 2 diabetes mellitus, hypertension who presents to the emergency department from her care home with [facility] for altered mentation.   Patient states that she has a dry cough and sore throat over the last several days.  Does deny abdominal pain and dysuria however has a chronic indwelling Foley catheter and history of recurrent UTIs.  She was admitted to [facility] in August 2024 for treatment of pyelonephritis and associated sepsis.   CT abdomen chest done in emergency room reveals possible phlegmon on the pole of the right kidney.  Urology consulted for comment.  Does not recommend surgical intervention at this time.  Cultures were drawn and patient started on sepsis bundle.  Patient was hypotensive in ED but did respond to fluid resuscitation.   ED Course: CT imaging survey included chest abdomen pelvis.  CT abdomen findings regarding kidney as above.  CT also revealed a thin pulmonary embolism extending into both main pulmonary arteries.  Patient on home Eliquis .  This was held and heparin  GTT was initiated.  Patient was admitted to medicine service for further workup and management of sepsis, PE as outlined in detail below.  07/19/23 -stable, improving. Ready to dc back to facility when bed available  Assessment & Plan  Principal Problem:   Sepsis due to gram-negative UTI [facility]) Active Problems:   Severe sepsis without septic shock (HCC)  Severe sepsis without septic shock- hypotension improved. Presumed source is urine and potentially pararenal phlegmon. Cysts images reviewed by urology.  No surgical intervention warranted from their standpoint. Urine culture multi-species Blood culture positive for staph in aerobic bottle  only - continue IV antibiotics Follow blood cultures- staph Monitor abdominal examination Currently there is no drainable fluid pocket there.  If clinical situation deteriorates recommend reimage and consult of IR for consideration for image guided drain Monitor fever curve   AKI- resolved. Baseline Cr around 0.8 and peaked at 1.2 - avoid nephrotoxins   Acute PE- on 1L [place] CTA chest reveals small linear PE extending into bilateral pulmonary arteries.  No right heart strain noted on CT or echo. She denies DOAC use and endorses that she did use aspirin .  - transitioned to eliquis   - wean O2 as tolerated   CAD  HLD-  Continue home aspirin  Continue Lipitor   Type 2 diabetes mellitus Diabetic neuropathy Sliding scale coverage Resume home Lyrica    Depression Home Cymbalta    GERD PPI   IBS Home Linzess   Body mass index is 50.19 kg/m.  VTE ppx: heparin  > eliquis   Diet:     Diet   Diet Carb Modified Fluid consistency: Thin   Consultants: None   Subjective 07/19/23    Pt reports feeling well today. Denies SOB or chest pain at rest.    Objective   Vitals:   07/18/23 0811 07/18/23 1638 07/18/23 1812 07/18/23 2325  BP: 93/70 (!) 76/60 94/67 100/64  Pulse: 74 76 71 69  Resp: 15 18  18   Temp: (!) 97.5 F (36.4 C) 97.6 F (36.4 C)  97.7 F (36.5 C)  TempSrc: Oral Oral    SpO2: 100% 100%  100%  Weight:      Height:  Intake/Output Summary (Last 24 hours) at 07/19/2023 9287 Last data filed at 07/19/2023 0547 Gross per 24 hour  Intake 161.54 ml  Output 1000 ml  Net -838.46 ml   Filed Weights   07/15/23 2043  Weight: (!) 136.8 kg    Physical Exam:  General: alert, NAD HEENT: atraumatic, clear conjunctiva, anicteric sclera, MMM, hearing grossly normal Respiratory: normal respiratory effort. Cardiovascular: quick capillary refill, normal S1/S2, RRR, no JVD, murmurs Gastrointestinal: soft, NT, ND Nervous: A&O x3. no new gross focal neurologic deficits,  normal speech Extremities: no edema Skin: dry, intact, normal temperature, normal color. No rashes, lesions or ulcers on exposed skin Psychiatry: normal mood, congruent affect  Labs   I have personally reviewed the following labs and imaging studies CBC    Component Value Date/Time   WBC 7.6 07/18/2023 0400   RBC 3.53 (L) 07/18/2023 0400   HGB 9.6 (L) 07/18/2023 0400   HCT 29.8 (L) 07/18/2023 0400   PLT 267 07/18/2023 0400   MCV 84.4 07/18/2023 0400   MCH 27.2 07/18/2023 0400   MCHC 32.2 07/18/2023 0400   RDW 15.1 07/18/2023 0400   LYMPHSABS 1.7 07/17/2023 0635   MONOABS 0.8 07/17/2023 0635   EOSABS 0.7 (H) 07/17/2023 0635   BASOSABS 0.1 07/17/2023 0635      Latest Ref Rng & Units 07/18/2023    4:00 AM 07/17/2023    6:35 AM 07/15/2023   10:48 PM  BMP  Glucose 70 - 99 mg/dL 894  888  80   BUN 6 - 20 mg/dL 12  11  16    Creatinine 0.44 - 1.00 mg/dL 9.14  9.09  8.79   Sodium 135 - 145 mmol/L 139  139  134   Potassium 3.5 - 5.1 mmol/L 3.4  3.7  4.3   Chloride 98 - 111 mmol/L 104  106  98   CO2 22 - 32 mmol/L 24  24  22    Calcium  8.9 - 10.3 mg/dL 8.0  8.1  8.4     No results found.   Disposition Plan & Communication  Patient status: Inpatient  Admitted From: Home Planned disposition location: Skilled nursing facility Anticipated discharge date: 1/4 pending SNF placement  Family Communication: none at bedside    Author: Marien LITTIE Piety, DO [facility] 07/19/2023, 7:12 AM   Available by Epic secure chat 7AM-7PM. If 7PM-7AM, please contact night-coverage.  TRH contact information found on [email or web address].

## 2023-07-19 NOTE — TOC Progression Note (Signed)
 Transition of Care [facility]) - Progression Note    Patient Details  Name: Wendy Nelson MRN: [id number] Date of Birth: December 21, 1965  Transition of Care [facility]) CM/SW Contact  Royanne JINNY Bernheim, RN Phone Number: 07/19/2023, 11:19 AM  Clinical Narrative:    Met with the patient in the room, she is a [facility] at [facility] and [facility], she is wearing acute Oxygen will monitor for needs at DC, I reached out to [facility] and confirmed that [facility] can return to [facility] at DC   Expected Discharge Plan: Long Term Nursing Home Barriers to Discharge: Continued Medical Work up  Expected Discharge Plan and Services   Discharge Planning Services: CM Consult   Living arrangements for the past 2 months: Skilled Nursing Facility                 DME Arranged: N/A DME Agency: NA                   Social Determinants of Health (SDOH) Interventions SDOH Screenings   Food Insecurity: No Food Insecurity (07/18/2023)  Housing: Low Risk  (07/18/2023)  Transportation Needs: No Transportation Needs (07/18/2023)  Utilities: Not At Risk (07/18/2023)  Financial Resource Strain: Low Risk  (01/20/2023)   Received from [facility]  Tobacco Use: Low Risk  (07/15/2023)  Recent Concern: Tobacco Use - Medium Risk (06/24/2023)   Received from [facility]    Readmission Risk Interventions     No data to display

## 2023-07-19 NOTE — Plan of Care (Signed)

## 2023-07-19 NOTE — Care Management Important Message (Signed)
 Important Message  Patient Details  Name: Wendy Nelson MRN: [id number] Date of Birth: August 27, 1965   Important Message Given:  Yes - Medicare IM     Amaura Authier W, CMA 07/19/2023, 9:55 AM

## 2023-07-19 NOTE — Progress Notes (Addendum)
 Pharmacy Antibiotic Note  Wendy Nelson is a 58 y.o. female admitted on 07/16/2023 with sepsis 2/2 UTI. Patient presenting with altered mental status. PMH significant for stroke, CAD, T2DM, HTN, and quadriplegia. Pharmacy has been consulted for Zosyn  dosing.  Bcx growing Staph species in 1 of 4 bottles (likely a contaminant). Ucx growing ESBL klebsiella pneumoniae, Providencia stuartii, and E. Faecalis. Patient remains afebrile with WBC 7.6.  Plan: Continue Zosyn  3.375 g IV every 8 hours based on current renal function Monitor renal function, clinical status, culture data, and LOT F/u susceptibilities of E. Faecalis in Ucx  Height: 5' 5 (165.1 cm) Weight: (!) 136.8 kg (301 lb 9.4 oz) IBW/kg (Calculated) : 57  Temp (24hrs), Avg:97.7 F (36.5 C), Min:97.6 F (36.4 C), Max:97.8 F (36.6 C)  Recent Labs  Lab 07/15/23 2248 07/17/23 0635 07/18/23 0400  WBC 14.2* 9.4 7.6  CREATININE 1.20* 0.90 0.85  LATICACIDVEN 1.4  --   --     Estimated Creatinine Clearance: 105 mL/min (by C-G formula based on SCr of 0.85 mg/dL).    Allergies  Allergen Reactions   Melatonin    Misc. Sulfonamide Containing Compounds    Neomycin Other (See Comments)    Positive patch test   Sulfa Antibiotics Itching   Antimicrobials this admission: ceftriaxone  12/30 x1 Azithromycin  12/30 x1 Vancomycin  12/31 x1 Zosyn  12/31 >>   Dose adjustments this admission: N/A  Microbiology results: 12/30 BCx: Staph sp 1/4 bottles 12/31 MRSA PCR: positive 12/31 Ucx:  60K ESBL klebsiella pneumoniae  80K Providencia Stuartii 60 K E. Facealis (susceptibilities pending)  Thank you for involving pharmacy in this patient's care.   Damien Napoleon, PharmD Clinical Pharmacist 07/19/2023 9:00 AM

## 2023-07-19 NOTE — NC FL2 (Signed)
 [place]  MEDICAID FL2 LEVEL OF CARE FORM     IDENTIFICATION  Patient Name: Wendy Nelson Birthdate: 1966/04/26 Sex: female Admission Date (Current Location): 07/16/2023  [place] and [place] Number:  Chiropodist and Address:  [facility], [address] [place], [place] [address]      Provider Number: [id number]  Attending Physician Name and Address:  Lenon Marien CROME, MD  Relative Name and Phone Number:  Wendy Nelson  Mother  Emergency Contact  [phone]    Current Level of Care: Hospital Recommended Level of Care: Nursing Facility Prior Approval Number:    Date Approved/Denied:   PASRR Number: [id number]  Discharge Plan: SNF    Current Diagnoses: Patient Active Problem List   Diagnosis Date Noted   Sepsis due to gram-negative UTI (HCC) 07/16/2023   Severe sepsis without septic shock (HCC) 07/16/2023   CAD (coronary artery disease) 02/18/2023   Acute pyelonephritis 02/18/2023   Acute respiratory failure with hypoxia (HCC) 02/18/2023   NSTEMI (non-ST elevated myocardial infarction) (HCC) 01/08/2023   Morbid obesity with BMI of 50.0-59.9, adult (HCC) 01/08/2023   Chronic indwelling Foley catheter 01/08/2023   Headache 01/08/2023   Jaw pain 01/08/2023   Lactic acidosis 10/31/2021   Complicated UTI (urinary tract infection) 10/30/2021   SIRS (systemic inflammatory response syndrome), possible sepsis(HCC)    Quadriplegia, 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 09/22/21 (HCC)    Sepsis (HCC) 12/23/2020   Neurogenic bladder    Syncope 10/19/2020   AKI (acute kidney injury) (HCC) 10/19/2020   Bilateral hydronephrosis 10/19/2020   HTN (hypertension) 10/19/2020   HLD (hyperlipidemia) 10/19/2020   Depression with anxiety 10/19/2020   Hypotension 09/2020   Cervical spondylosis with myelopathy and radiculopathy 09/2020   Spinal cord infarction (HCC) 09/2020    Orientation  RESPIRATION BLADDER Height & Weight     Self, Time, Situation, Place  O2 (2 liters) Incontinent Weight: (!) 136.8 kg Height:  5' 5 (165.1 cm)  BEHAVIORAL SYMPTOMS/MOOD NEUROLOGICAL BOWEL NUTRITION STATUS      Incontinent Diet (see dc summary)  AMBULATORY STATUS COMMUNICATION OF NEEDS Skin   Extensive Assist Verbally Normal                       Personal Care Assistance Level of Assistance  Bathing, Feeding, Dressing Bathing Assistance: Maximum assistance Feeding assistance: Limited assistance Dressing Assistance: Maximum assistance     Functional Limitations Info  Sight, Hearing, Speech Sight Info: Adequate Hearing Info: Adequate Speech Info: Adequate    SPECIAL CARE FACTORS FREQUENCY                       Contractures Contractures Info: Not present    Additional Factors Info  Code Status, Allergies Code Status Info: Full Allergies Info: Melatonin, Misc. Sulfonamide Containing Compounds, Neomycin, Sulfa Antibiotics           Current Medications (07/19/2023):  This is the current hospital active medication list Current Facility-Administered Medications  Medication Dose Route Frequency Provider Last Rate Last Admin   acetaminophen  (TYLENOL ) tablet 650 mg  650 mg Oral Q6H PRN Sreenath, Sudheer B, MD   650 mg at 07/18/23 0855   apixaban  (ELIQUIS ) tablet 10 mg  10 mg Oral BID Merrill, Kristin A, RPH   10 mg at 07/19/23 1029   Followed by   NOREEN ON 07/25/2023] apixaban  (ELIQUIS ) tablet 5 mg  5 mg Oral BID Merrill, Kristin A, [facility]  aspirin  EC tablet 81 mg  81 mg Oral Daily Lenon Pons L, MD   81 mg at 07/19/23 1029   atorvastatin  (LIPITOR) tablet 10 mg  10 mg Oral Daily Sreenath, Sudheer B, MD   10 mg at 07/19/23 1029   baclofen  (LIORESAL ) tablet 5 mg  5 mg Oral TID Sreenath, Sudheer B, MD   5 mg at 07/19/23 1029   benzonatate  (TESSALON ) capsule 100 mg  100 mg Oral Daily PRN Sreenath, Sudheer B, MD       Chlorhexidine  Gluconate Cloth 2 % PADS 6 each  6  each Topical Q0600 Lenon Pons CROME, MD       dextrose  50 % solution 50 mL  1 ampule Intravenous PRN Sreenath, Sudheer B, MD       DULoxetine  (CYMBALTA ) DR capsule 30 mg  30 mg Oral Daily Sreenath, Sudheer B, MD   30 mg at 07/19/23 1030   DULoxetine  (CYMBALTA ) DR capsule 60 mg  60 mg Oral Daily Sreenath, Sudheer B, MD   60 mg at 07/19/23 1029   fluticasone  (FLONASE ) 50 MCG/ACT nasal spray 1 spray  1 spray Each Nare Daily Sreenath, Sudheer B, MD   1 spray at 07/17/23 1002   insulin  aspart (novoLOG ) injection 0-9 Units  0-9 Units Subcutaneous TID WC Jhonny Sahara B, MD   1 Units at 07/18/23 1257   linaclotide  (LINZESS ) capsule 290 mcg  290 mcg Oral QAC breakfast Niels Kayla FALCON, RPH   290 mcg at 07/19/23 0853   mupirocin  ointment (BACTROBAN ) 2 % 1 Application  1 Application Nasal BID Lenon Pons CROME, MD       ondansetron  (ZOFRAN ) injection 4 mg  4 mg Intravenous Q6H PRN Jhonny Sahara B, MD       pantoprazole  (PROTONIX ) EC tablet 40 mg  40 mg Oral Daily Sreenath, Sudheer B, MD   40 mg at 07/19/23 1029   piperacillin -tazobactam (ZOSYN ) IVPB 3.375 g  3.375 g Intravenous Q8H Elesa Perkins, RPH 12.5 mL/hr at 07/19/23 0853 3.375 g at 07/19/23 9146   pregabalin  (LYRICA ) capsule 150 mg  150 mg Oral BID Sreenath, Sudheer B, MD   150 mg at 07/19/23 1029   promethazine  (PHENERGAN ) tablet 12.5 mg  12.5 mg Oral Q6H PRN Jhonny Sahara B, MD       traZODone  (DESYREL ) tablet 50 mg  50 mg Oral QHS Sreenath, Sudheer B, MD   50 mg at 07/18/23 2240     Discharge Medications: Please see discharge summary for a list of discharge medications.  Relevant Imaging Results:  Relevant Lab Results:   Additional Information SS#: [id number]  Casidy Alberta J Winna Golla, RN

## 2023-07-20 DIAGNOSIS — J9601 Acute respiratory failure with hypoxia: Secondary | ICD-10-CM | POA: Diagnosis not present

## 2023-07-20 DIAGNOSIS — A4150 Gram-negative sepsis, unspecified: Secondary | ICD-10-CM | POA: Diagnosis not present

## 2023-07-20 DIAGNOSIS — N390 Urinary tract infection, site not specified: Secondary | ICD-10-CM | POA: Diagnosis not present

## 2023-07-20 LAB — GLUCOSE, CAPILLARY
Glucose-Capillary: 79 mg/dL (ref 70–99)
Glucose-Capillary: 105 mg/dL — ABNORMAL HIGH (ref 70–99)
Glucose-Capillary: 111 mg/dL — ABNORMAL HIGH (ref 70–99)
Glucose-Capillary: 115 mg/dL — ABNORMAL HIGH (ref 70–99)

## 2023-07-20 LAB — CULTURE, BLOOD (ROUTINE X 2): Culture: NO GROWTH

## 2023-07-20 NOTE — Plan of Care (Signed)
  Problem: Education: Goal: Knowledge of General Education information will improve Description: Including pain rating scale, medication(s)/side effects and non-pharmacologic comfort measures Outcome: Progressing   Problem: Activity: Goal: Risk for activity intolerance will decrease Outcome: Progressing   Problem: Nutrition: Goal: Adequate nutrition will be maintained Outcome: Progressing   Problem: Pain Management: Goal: General experience of comfort will improve Outcome: Progressing   Problem: Safety: Goal: Ability to remain free from injury will improve Outcome: Progressing   Problem: Skin Integrity: Goal: Risk for impaired skin integrity will decrease Outcome: Progressing

## 2023-07-20 NOTE — Progress Notes (Signed)
 PROGRESS NOTE  Wendy Nelson    DOB: 1966/03/29, 58 y.o.  FMW:[id number]    Code Status: Prior   DOA: 07/16/2023   LOS: 4   Brief hospital course  Wendy Nelson is a 58 y.o. female with medical history significant of CVA, chronic quadriplegia, coronary artery disease, type 2 diabetes mellitus, hypertension who presents to the emergency department from her care home with [facility] for altered mentation.   Patient states that she has a dry cough and sore throat over the last several days.  Does deny abdominal pain and dysuria however has a chronic indwelling Foley catheter and history of recurrent UTIs.  She was admitted to [facility] in August 2024 for treatment of pyelonephritis and associated sepsis.   CT abdomen chest done in emergency room reveals possible phlegmon on the pole of the right kidney.  Urology consulted for comment.  Does not recommend surgical intervention at this time.  Cultures were drawn and patient started on sepsis bundle.  Patient was hypotensive in ED but did respond to fluid resuscitation.   ED Course: CT imaging survey included chest abdomen pelvis.  CT abdomen findings regarding kidney as above.  CT also revealed a thin pulmonary embolism extending into both main pulmonary arteries.  Patient on home Eliquis .  This was held and heparin  GTT was initiated.  Patient was admitted to medicine service for further workup and management of sepsis, PE as outlined in detail below.  07/20/23 -stable, improving. Due to no readily available PO version of antibiotic for patient's infection, will complete IV course prior to dc. Likely Monday.   Assessment & Plan  Principal Problem:   Sepsis due to gram-negative UTI [facility]) Active Problems:   Severe sepsis without septic shock (HCC)  Severe sepsis without septic shock- hypotension improved. Presumed source is urine and potentially pararenal phlegmon. Cysts images reviewed by urology.  No surgical intervention warranted from their  standpoint. Urine culture multi-species. Per urology: Currently there is no drainable fluid pocket there.  If clinical situation deteriorates recommend reimage and consult of IR for consideration for image guided drain Blood culture positive for staph in aerobic bottle only - continue IV antibiotics Follow blood cultures- staph Monitor abdominal examination Monitor fever curve   AKI- resolved. Baseline Cr around 0.8 and peaked at 1.2 - avoid nephrotoxins   Acute PE- on room air CTA chest reveals small linear PE extending into bilateral pulmonary arteries.  No right heart strain noted on CT or echo. She denies DOAC use and endorses that she did use aspirin .  - transitioned to eliquis   - wean O2 as tolerated   CAD  HLD-  Continue home aspirin  Continue Lipitor   Type 2 diabetes mellitus Diabetic neuropathy Sliding scale coverage Resume home Lyrica    Depression Home Cymbalta    GERD PPI   IBS Home Linzess   Body mass index is 50.19 kg/m.  VTE ppx: heparin  > eliquis   Diet:     Diet   Diet Carb Modified Fluid consistency: Thin   Consultants: None   Subjective 07/20/23    Pt reports feeling well today. Denies SOB or chest pain at rest.    Objective   Vitals:   07/18/23 2325 07/19/23 0825 07/19/23 1556 07/19/23 2304  BP: 100/64 100/62 (!) 86/51 103/86  Pulse: 69 69 65 79  Resp: 18 19 17 20   Temp: 97.7 F (36.5 C) 97.8 F (36.6 C) 97.7 F (36.5 C) 97.7 F (36.5 C)  TempSrc:  Oral Oral Oral  SpO2: 100%  100% 100% 100%  Weight:      Height:        Intake/Output Summary (Last 24 hours) at 07/20/2023 0724 Last data filed at 07/20/2023 0500 Gross per 24 hour  Intake 150 ml  Output 1125 ml  Net -975 ml   Filed Weights   07/15/23 2043  Weight: (!) 136.8 kg    Physical Exam:  General: alert, NAD HEENT: atraumatic, clear conjunctiva, anicteric sclera, MMM, hearing grossly normal Respiratory: normal respiratory effort. Cardiovascular: quick capillary refill,  normal S1/S2, RRR, no JVD, murmurs Gastrointestinal: soft, NT, ND Nervous: A&O x3. no new gross focal neurologic deficits, normal speech Extremities: no edema Skin: dry, intact, normal temperature, normal color. No rashes, lesions or ulcers on exposed skin Psychiatry: normal mood, congruent affect  Labs   I have personally reviewed the following labs and imaging studies CBC    Component Value Date/Time   WBC 7.6 07/18/2023 0400   RBC 3.53 (L) 07/18/2023 0400   HGB 9.6 (L) 07/18/2023 0400   HCT 29.8 (L) 07/18/2023 0400   PLT 267 07/18/2023 0400   MCV 84.4 07/18/2023 0400   MCH 27.2 07/18/2023 0400   MCHC 32.2 07/18/2023 0400   RDW 15.1 07/18/2023 0400   LYMPHSABS 1.7 07/17/2023 0635   MONOABS 0.8 07/17/2023 0635   EOSABS 0.7 (H) 07/17/2023 0635   BASOSABS 0.1 07/17/2023 0635      Latest Ref Rng & Units 07/18/2023    4:00 AM 07/17/2023    6:35 AM 07/15/2023   10:48 PM  BMP  Glucose 70 - 99 mg/dL 894  888  80   BUN 6 - 20 mg/dL 12  11  16    Creatinine 0.44 - 1.00 mg/dL 9.14  9.09  8.79   Sodium 135 - 145 mmol/L 139  139  134   Potassium 3.5 - 5.1 mmol/L 3.4  3.7  4.3   Chloride 98 - 111 mmol/L 104  106  98   CO2 22 - 32 mmol/L 24  24  22    Calcium  8.9 - 10.3 mg/dL 8.0  8.1  8.4     No results found.   Disposition Plan & Communication  Patient status: Inpatient  Admitted From: Home Planned disposition location: Skilled nursing facility Anticipated discharge date: 1/6 pending SNF placement  Family Communication: none at bedside    Author: Marien LITTIE Piety, DO [facility] 07/20/2023, 7:24 AM   Available by Epic secure chat 7AM-7PM. If 7PM-7AM, please contact night-coverage.  TRH contact information found on [email or web address].

## 2023-07-20 NOTE — TOC Progression Note (Signed)
 Transition of Care [facility]) - Progression Note    Patient Details  Name: Wendy Nelson MRN: [id number] Date of Birth: 11-Nov-1965  Transition of Care [facility]) CM/SW Contact  Racheal LITTIE Schimke, RN Phone Number: 07/20/2023, 2:47 PM  Clinical Narrative:  Unable to discharge today, per MD need to completed antibiotics. Will discharge on Monday.     Expected Discharge Plan: Long Term Nursing Home Barriers to Discharge: Barriers Resolved  Expected Discharge Plan and Services   Discharge Planning Services: CM Consult   Living arrangements for the past 2 months: Skilled Nursing Facility                 DME Arranged: N/A DME Agency: NA       HH Arranged: NA HH Agency: NA         Social Determinants of Health (SDOH) Interventions SDOH Screenings   Food Insecurity: No Food Insecurity (07/18/2023)  Housing: Low Risk  (07/18/2023)  Transportation Needs: No Transportation Needs (07/18/2023)  Utilities: Not At Risk (07/18/2023)  Financial Resource Strain: Low Risk  (01/20/2023)   Received from [facility]  Tobacco Use: Low Risk  (07/15/2023)  Recent Concern: Tobacco Use - Medium Risk (06/24/2023)   Received from [facility]    Readmission Risk Interventions     No data to display

## 2023-07-20 NOTE — TOC Transition Note (Signed)
 Transition of Care [facility]) - Discharge Note   Patient Details  Name: Ryn Peine MRN: [id number] Date of Birth: 07-18-1965  Transition of Care [facility]) CM/SW Contact:  Racheal LITTIE Schimke, RN Phone Number: 07/20/2023, 11:02 AM   Clinical Narrative: Will discharge back to [facility] LTC room 209 B , mother notified of discharge, voices understanding no questions. AEMS arranged.     Final next level of care: Long Term Acute Care (LTAC) Barriers to Discharge: Barriers Resolved   Patient Goals and CMS Choice            Discharge Placement              Patient chooses bed at: [facility] [place] (LTC [address] B) Patient to be transferred to facility by: [facility]EM[facility] Name of family member notified: Mother Kjerstin Abrigo Patient and family notified of of transfer: 07/20/23  Discharge Plan and Services Additional resources added to the After Visit Summary for     Discharge Planning Services: CM Consult            DME Arranged: N/A DME Agency: NA       HH Arranged: NA HH Agency: NA        Social Drivers of Health (SDOH) Interventions SDOH Screenings   Food Insecurity: No Food Insecurity (07/18/2023)  Housing: Low Risk  (07/18/2023)  Transportation Needs: No Transportation Needs (07/18/2023)  Utilities: Not At Risk (07/18/2023)  Financial Resource Strain: Low Risk  (01/20/2023)   Received from [facility]  Tobacco Use: Low Risk  (07/15/2023)  Recent Concern: Tobacco Use - Medium Risk (06/24/2023)   Received from [facility]     Readmission Risk Interventions     No data to display

## 2023-07-21 DIAGNOSIS — J9601 Acute respiratory failure with hypoxia: Secondary | ICD-10-CM | POA: Diagnosis not present

## 2023-07-21 DIAGNOSIS — N390 Urinary tract infection, site not specified: Secondary | ICD-10-CM | POA: Diagnosis not present

## 2023-07-21 DIAGNOSIS — A4150 Gram-negative sepsis, unspecified: Secondary | ICD-10-CM | POA: Diagnosis not present

## 2023-07-21 LAB — CULTURE, BLOOD (ROUTINE X 2)

## 2023-07-21 LAB — URINE CULTURE: Culture: 60000 — AB

## 2023-07-21 LAB — GLUCOSE, CAPILLARY
Glucose-Capillary: 148 mg/dL — ABNORMAL HIGH (ref 70–99)
Glucose-Capillary: 126 mg/dL — ABNORMAL HIGH (ref 70–99)
Glucose-Capillary: 139 mg/dL — ABNORMAL HIGH (ref 70–99)

## 2023-07-21 NOTE — Plan of Care (Signed)
  Problem: Education: Goal: Ability to describe self-care measures that may prevent or decrease complications (Diabetes Survival Skills Education) will improve Outcome: Progressing   Problem: Coping: Goal: Ability to adjust to condition or change in health will improve Outcome: Progressing   Problem: Nutritional: Goal: Maintenance of adequate nutrition will improve Outcome: Progressing Goal: Progress toward achieving an optimal weight will improve Outcome: Progressing   Problem: Skin Integrity: Goal: Risk for impaired skin integrity will decrease Outcome: Progressing

## 2023-07-21 NOTE — Plan of Care (Signed)

## 2023-07-21 NOTE — Progress Notes (Signed)
 PROGRESS NOTE  Wendy Nelson    DOB: [birth year], 58 y.o.  FMW:[id number]    Code Status: Prior   DOA: 07/16/2023   LOS: 5   Brief hospital course  Wendy Nelson is a 58 y.o. female with medical history significant of CVA, chronic quadriplegia, coronary artery disease, type 2 diabetes mellitus, hypertension who presents to the emergency department from her care home with [facility] for altered mentation.   CT abdomen chest done in emergency room reveals possible phlegmon on the pole of the right kidney.  Urology consulted. Does not recommend surgical intervention at this time.  Cultures were drawn and patient started on sepsis bundle.  Patient was hypotensive in ED but did respond to fluid resuscitation.   ED Course: CT imaging survey included chest abdomen pelvis.  CT abdomen findings regarding kidney as above.  CT also revealed a thin pulmonary embolism extending into both main pulmonary arteries.  Patient on home Eliquis .  This was held and heparin  GTT was initiated.  Patient was admitted to medicine service for further workup and management of sepsis, PE as outlined in detail below.  07/21/23 -stable, improving. Due to no readily available PO version of antibiotic for patient's infection, will complete IV course prior to dc. Likely Monday.   Assessment & Plan  Principal Problem:   Sepsis due to gram-negative UTI [facility]) Active Problems:   Severe sepsis without septic shock (HCC)  Severe sepsis without septic shock- hypotension improved. Presumed source is urine and potentially pararenal phlegmon. Cysts images reviewed by urology.  No surgical intervention warranted from their standpoint. Urine culture multi-species. Per urology: Currently there is no drainable fluid pocket there.  If clinical situation deteriorates recommend reimage and consult of IR for consideration for image guided drain Blood culture positive for staph in aerobic bottle only - continue IV antibiotics Follow blood  cultures- staph Monitor abdominal examination Monitor fever curve   AKI- resolved. Baseline Cr around 0.8 and peaked at 1.2 - avoid nephrotoxins   Acute PE- on room air- back on O2 today but no documented desaturations.  CTA chest reveals small linear PE extending into bilateral pulmonary arteries.  No right heart strain noted on CT or echo. She denies DOAC use and endorses that she did use aspirin .  - transitioned to eliquis   - wean O2 as tolerated   CAD  HLD-  Continue home aspirin  Continue Lipitor   Type 2 diabetes mellitus Diabetic neuropathy Sliding scale coverage Resume home Lyrica    Depression Home Cymbalta    GERD PPI   IBS Home Linzess   Body mass index is 50.19 kg/m.  VTE ppx: heparin  > eliquis   Diet:     Diet   Diet Carb Modified Fluid consistency: Thin   Consultants: None   Subjective 07/21/23    Pt reports feeling well today. Denies SOB or chest pain at rest.    Objective   Vitals:   07/19/23 2304 07/20/23 0756 07/20/23 1526 07/20/23 2125  BP: 103/86 107/80 (!) 140/90 125/82  Pulse: 79 69 66 73  Resp: 20 16 16 18   Temp: 97.7 F (36.5 C) 98.2 F (36.8 C) 98.1 F (36.7 C) 97.8 F (36.6 C)  TempSrc: Oral     SpO2: 100% 100% 100% 97%  Weight:      Height:        Intake/Output Summary (Last 24 hours) at 07/21/2023 0716 Last data filed at 07/21/2023 9376 Gross per 24 hour  Intake --  Output 2550 ml  Net -2550 ml  Filed Weights   07/15/23 2043  Weight: (!) 136.8 kg    Physical Exam:  General: alert, NAD HEENT: atraumatic, clear conjunctiva, anicteric sclera, MMM, hearing grossly normal Respiratory: normal respiratory effort. Cardiovascular: quick capillary refill, normal S1/S2, RRR, no JVD, murmurs Gastrointestinal: soft, NT, ND Nervous: A&O x3. no new gross focal neurologic deficits, normal speech Extremities: no edema Skin: dry, intact, normal temperature, normal color. No rashes, lesions or ulcers on exposed skin Psychiatry:  normal mood, congruent affect  Labs   I have personally reviewed the following labs and imaging studies CBC    Component Value Date/Time   WBC 7.6 07/18/2023 0400   RBC 3.53 (L) 07/18/2023 0400   HGB 9.6 (L) 07/18/2023 0400   HCT 29.8 (L) 07/18/2023 0400   PLT 267 07/18/2023 0400   MCV 84.4 07/18/2023 0400   MCH 27.2 07/18/2023 0400   MCHC 32.2 07/18/2023 0400   RDW 15.1 07/18/2023 0400   LYMPHSABS 1.7 07/17/2023 0635   MONOABS 0.8 07/17/2023 0635   EOSABS 0.7 (H) 07/17/2023 0635   BASOSABS 0.1 07/17/2023 0635      Latest Ref Rng & Units 07/18/2023    4:00 AM 07/17/2023    6:35 AM 07/15/2023   10:48 PM  BMP  Glucose 70 - 99 mg/dL 894  888  80   BUN 6 - 20 mg/dL 12  11  16    Creatinine 0.44 - 1.00 mg/dL 9.14  9.09  8.79   Sodium 135 - 145 mmol/L 139  139  134   Potassium 3.5 - 5.1 mmol/L 3.4  3.7  4.3   Chloride 98 - 111 mmol/L 104  106  98   CO2 22 - 32 mmol/L 24  24  22    Calcium  8.9 - 10.3 mg/dL 8.0  8.1  8.4     No results found.   Disposition Plan & Communication  Patient status: Inpatient  Admitted From: Home Planned disposition location: Skilled nursing facility Anticipated discharge date: 1/6 pending SNF placement  Family Communication: none at bedside    Author: Marien LITTIE Piety, DO [facility] 07/21/2023, 7:16 AM   Available by Epic secure chat 7AM-7PM. If 7PM-7AM, please contact night-coverage.  [facility] contact information found on [email or web address].

## 2023-07-22 DIAGNOSIS — I2699 Other pulmonary embolism without acute cor pulmonale: Secondary | ICD-10-CM

## 2023-07-22 DIAGNOSIS — N390 Urinary tract infection, site not specified: Secondary | ICD-10-CM | POA: Diagnosis not present

## 2023-07-22 DIAGNOSIS — A4150 Gram-negative sepsis, unspecified: Secondary | ICD-10-CM | POA: Diagnosis not present

## 2023-07-22 LAB — GLUCOSE, CAPILLARY
Glucose-Capillary: 77 mg/dL (ref 70–99)
Glucose-Capillary: 125 mg/dL — ABNORMAL HIGH (ref 70–99)

## 2023-07-22 MED ORDER — APIXABAN 5 MG PO TABS: ORAL_TABLET | ORAL | Status: AC

## 2023-07-22 NOTE — Discharge Summary (Signed)
 Physician Discharge Summary  Patient: Wendy Nelson FMW:[id number] DOB: 07-06-1966   Code Status: Prior Admit date: 07/16/2023 Discharge date: 07/22/2023 Disposition: Skilled nursing facility, PT, OT, SLP, nurse aid, and RN PCP: [facility]  Recommendations for Outpatient Follow-up:  Follow up with PCP within 1-2 weeks Regarding general hospital follow up and preventative care Recommend routine foley catheter exchanges. She appears to have colonization and would not recommend treatment for positive UTI without symptoms as she has demonstrated significant drug resistance already.   Recommend continued DOAC indefinitely due to PE Consider sleep study as she has features of OSA observed during admission   Discharge Diagnoses:  Principal Problem:   Sepsis due to gram-negative UTI [facility]) Active Problems:   Severe sepsis without septic shock [facility])  Brief Hospital Course Summary: Wendy Nelson is a 58 y.o. female with medical history significant of CVA, chronic quadriplegia, coronary artery disease, type 2 diabetes mellitus, hypertension who presents for altered mentation.   ED Course: CT chest abdomen pelvis: possible phlegmon on the pole of the right kidney. also revealed a thin pulmonary embolism extending into both main pulmonary arteries.  She was requiring oxygen supplementation for hypoxia and IV fluids for hypotension which resolved quickly.  heparin  GTT was initiated for the PE.  Antibiotics were started for UTI and urology was consulted based on the CT findings. Does not recommend surgical intervention at this time.    She remained hemodynamically stable on heparin  gtt and was transitioned to DOAC. It appears she had been on eliquis  previously but was not currently taking it so would not consider it a treatment failure. She will continue indefinitely on eliquis  unless other contraindication occurs.  She will be on loading dose until evening of 1/8 and then can be  transitioned to the regular treatment dose on 1/9. She was able to wean to room air. Suspect that she may have obstructive sleep apnea and would recommend a sleep study when not in acute illness.   Her UTI was treated with IV zosyn  for 7 days and culture grew ESBL, multi-species with multi-drug resistance. She was asymptomatic throughout my time with her. Foley was exchanged on admission. Recommend very judicious consideration of evaluation and treatment of future UTI suspicion given the very limited options she has for treatment on susceptibilities.  Urology recommended no further follow up if she responded to IV Abx and since she stabilized, no further follow up needed but can consider outpatient referral if she has reoccurrence.   Discharge Condition: Good, improved Recommended discharge diet: Regular healthy diet  Consultations: Urology   Procedures/Studies: None   Allergies as of 07/22/2023       Reactions   Melatonin    Misc. Sulfonamide Containing Compounds    Neomycin Other (See Comments)   Positive patch test   Sulfa Antibiotics Itching        Medication List     STOP taking these medications    bisacodyl  10 MG suppository Commonly known as: DULCOLAX   CRANBERRY PO   D-Mannose 500 MG Caps   dextromethorphan -guaiFENesin  30-600 MG 12hr tablet Commonly known as: MUCINEX  DM   naloxone 4 MG/0.1ML Liqd nasal spray kit Commonly known as: NARCAN   ondansetron  4 MG disintegrating tablet Commonly known as: ZOFRAN -ODT   prednisoLONE  acetate 1 % ophthalmic suspension Commonly known as: PRED FORTE    senna-docusate 8.6-50 MG tablet Commonly known as: Senokot-S       TAKE these medications    Acetaminophen  500 MG capsule Take  1,000 mg by mouth every 6 (six) hours as needed.   apixaban  5 MG Tabs tablet Commonly known as: ELIQUIS  Take 2 tablets (10 mg total) by mouth 2 (two) times daily for 3 days, THEN 1 tablet (5 mg total) 2 (two) times daily. Start taking on:  July 22, 2023 What changed: See the new instructions.   aspirin  81 MG chewable tablet Chew 81 mg by mouth daily.   atorvastatin  10 MG tablet Commonly known as: LIPITOR Take 10 mg by mouth daily.   Baclofen  5 MG Tabs Take 1 tablet by mouth 3 (three) times daily.   benzonatate  100 MG capsule Commonly known as: TESSALON  Take 100 mg by mouth daily as needed for cough.   DermaPhor Oint Apply 1 application. topically daily. To feet.   DULoxetine  60 MG capsule Commonly known as: CYMBALTA  Take 60 mg by mouth daily.   DULoxetine  30 MG capsule Commonly known as: CYMBALTA  Take 30 mg by mouth daily.   Ergocalciferol 50 MCG (2000 UT) Tabs Take 2 tablets by mouth daily.   fluticasone  50 MCG/ACT nasal spray Commonly known as: FLONASE  Place 1 spray into both nostrils daily.   lidocaine  4 % cream Commonly known as: LMX Apply 1 Application topically daily. Apply to left shoulder.   Linzess  290 MCG Caps capsule Generic drug: linaclotide  Take 290 mcg by mouth daily before breakfast.   meclizine  25 MG tablet Commonly known as: ANTIVERT  Take 25 mg by mouth daily as needed.   metFORMIN  500 MG tablet Commonly known as: GLUCOPHAGE  Take 500 mg by mouth 2 (two) times daily with a meal.   multivitamin tablet Take 1 tablet by mouth daily.   Nyamyc  powder Generic drug: nystatin  Apply 1 Application topically 2 (two) times daily. To groin and abdominal folds   omeprazole 40 MG capsule Commonly known as: PRILOSEC Take 40 mg by mouth daily.   Oral Relief For Dry Mouth Lozg 1 lozenge by Transmucosal route as needed (dry mouth).   polyethylene glycol powder 17 GM/SCOOP powder Commonly known as: GLYCOLAX /MIRALAX  Take 1 Container by mouth daily.   pregabalin  150 MG capsule Commonly known as: LYRICA  Take 150 mg by mouth 2 (two) times daily.   promethazine  12.5 MG tablet Commonly known as: PHENERGAN  Take 12.5 mg by mouth every 6 (six) hours as needed for vomiting or nausea.    Slow-Mag 71.5-119 MG Tbec SR tablet Generic drug: magnesium chloride Take 1 tablet by mouth daily.   traZODone  50 MG tablet Commonly known as: DESYREL  Take 50 mg by mouth at bedtime.   Visine Dry Eye Relief 1 % Soln Generic drug: Polyethylene Glycol 400 Place 2 drops into both eyes 2 (two) times daily.        Contact information for after-discharge care     Destination     [facility] Preferred SNF .   Service: Skilled Nursing Contact information: [address] [place]  [address] [phone]                     Subjective   Pt reports no complaints. She is enjoying breakfast without nausea. Denies chest pain or SOB. No pelvic pain. Asks questions about her medications.   All questions and concerns were addressed at time of discharge.  Objective  Blood pressure (!) 141/91, pulse 67, temperature 98.3 F (36.8 C), resp. rate 16, height 5' 5 (1.651 m), weight (!) 136.8 kg, SpO2 100%.   General: Pt is alert, awake, not in acute  distress Cardiovascular: RRR, S1/S2 +, no rubs, no gallops Respiratory: CTA bilaterally, no wheezing, no rhonchi Abdominal: Soft, NT, ND, bowel sounds + Extremities: no edema, no cyanosis  The results of significant diagnostics from this hospitalization (including imaging, microbiology, ancillary and laboratory) are listed below for reference.   Imaging studies: CT CHEST ABDOMEN PELVIS W CONTRAST Result Date: 07/16/2023 CLINICAL DATA:  Sepsis. EXAM: CT CHEST, ABDOMEN, AND PELVIS WITH CONTRAST TECHNIQUE: Multidetector CT imaging of the chest, abdomen and pelvis was performed following the standard protocol during bolus administration of intravenous contrast. RADIATION DOSE REDUCTION: This exam was performed according to the departmental dose-optimization program which includes automated exposure control, adjustment of the mA and/or kV according to patient size and/or use of iterative reconstruction  technique. CONTRAST:  OMNIPAQUE  IOHEXOL  350 MG/ML SOLN COMPARISON:  02/18/2023 FINDINGS: CT CHEST FINDINGS Cardiovascular: The heart is enlarged. No substantial pericardial effusion. No thoracic aortic aneurysm. No substantial atherosclerosis of the thoracic aorta. A relatively thin long pulmonary embolus is identified in both main pulmonary arteries extending into the upper lobes bilaterally. This is best appreciated on coronal images 79-85 of series 5. Thrombus in the left main pulmonary artery is visible on axial images 20 and 21 of series 2. No evidence for right heart strain with RV/LV ratio of 0.78. Mediastinum/Nodes: 2.2 cm left thyroid  nodule evident. This has been evaluated on previous imaging. (ref: J Am Coll Radiol. 2015 Feb;12(2): 143-50).No mediastinal lymphadenopathy. There is no hilar lymphadenopathy. The esophagus has normal imaging features. There is no axillary lymphadenopathy. Lungs/Pleura: Bandlike subpleural scarring in both lungs similar to prior. No focal consolidative airspace opacity. Chronic atelectasis or scarring in the posterior left base is stable. No substantial pleural effusion. Musculoskeletal: No worrisome lytic or sclerotic osseous abnormality. CT ABDOMEN PELVIS FINDINGS Hepatobiliary: No suspicious focal abnormality within the liver parenchyma. Subtle high density along the dependent mucosa of the gallbladder lumen may be related to tiny stones. See image 66/2). No intrahepatic or extrahepatic biliary dilation. Pancreas: No focal mass lesion. No dilatation of the main duct. No intraparenchymal cyst. No peripancreatic edema. Spleen: No splenomegaly. No suspicious focal mass lesion. Adrenals/Urinary Tract: No adrenal nodule or mass. A focal 2.4 cm area of abnormal perfusion is identified in the lower pole right kidney. This is probably related to segmental edema from pyelonephritis as similar changes are noted in the upper pole of the right kidney in these findings are new  since the study of 02/18/2023. The lower pole abnormality does have a masslike character (see delayed image 22/7) and evolving intrarenal phlegmon could have this appearance. The mild right hydronephrosis is similar to prior study with mucosal hyperenhancement in the region of the UPJ and diffusely in the right ureter. Left kidney and ureter unremarkable. Bladder is decompressed by Foley catheter and gas in the bladder lumen is compatible with the instrumentation. Stomach/Bowel: Stomach is unremarkable. No gastric wall thickening. No evidence of outlet obstruction. Duodenum is normally positioned as is the ligament of Treitz. No small bowel wall thickening. No small bowel dilatation. The terminal ileum is normal. The appendix is normal. No gross colonic mass. No colonic wall thickening. Vascular/Lymphatic: There is mild atherosclerotic calcification of the abdominal aorta without aneurysm. There is no gastrohepatic or hepatoduodenal ligament lymphadenopathy. No retroperitoneal or mesenteric lymphadenopathy. No pelvic sidewall lymphadenopathy. Reproductive: There is no adnexal mass.  Uterus unremarkable. Other: No intraperitoneal free fluid. Musculoskeletal: No worrisome lytic or sclerotic osseous abnormality. IMPRESSION: 1. Relatively thin acute pulmonary embolus in both main pulmonary  arteries extending into the upper lobes bilaterally. No evidence for right heart strain. 2. Focal 2.4 cm area of abnormal perfusion in the lower pole right kidney. This is probably related to segmental edema from pyelonephritis as similar changes are noted in the upper pole of the right kidney in these findings are new since the study of 02/18/2023. The lower pole abnormality does have a masslike character and evolving intrarenal phlegmon could have this appearance. Close follow-up recommended to exclude mass lesion. 3. Mild right hydronephrosis is similar to prior study with mucosal hyperenhancement in the region of the UPJ and  diffusely in the right ureter. Imaging features compatible with ascending urinary tract infection. 4. Subtle high density along the dependent mucosa of the gallbladder lumen may be related to tiny stones. 5.  Aortic Atherosclerosis (ICD10-I70.0). Critical Value/emergent results were called by telephone at the time of interpretation on 07/16/2023 at 6:06 am to provider [facility] , who verbally acknowledged these results. Electronically Signed   By: Camellia Candle M.D.   On: 07/16/2023 06:08   DG Chest Port 1 View Result Date: 07/16/2023 CLINICAL DATA:  Altered mental status.  Concern for infection. EXAM: PORTABLE CHEST 1 VIEW COMPARISON:  Radiograph 02/18/2023 FINDINGS: Chronic cardiomegaly. Unchanged mediastinal contours. Left pleural effusion. No confluent consolidation. No pneumothorax. Lower cervical spine hardware is partially included. IMPRESSION: 1. Left pleural effusion. 2. Chronic cardiomegaly. Electronically Signed   By: Andrea Gasman M.D.   On: 07/16/2023 04:12    Labs: Basic Metabolic Panel: Recent Labs  Lab 07/15/23 2248 07/17/23 0635 07/18/23 0400  NA 134* 139 139  K 4.3 3.7 3.4*  CL 98 106 104  CO2 22 24 24   GLUCOSE 80 111* 105*  BUN 16 11 12   CREATININE 1.20* 0.90 0.85  CALCIUM  8.4* 8.1* 8.0*   CBC: Recent Labs  Lab 07/15/23 2248 07/17/23 0635 07/18/23 0400  WBC 14.2* 9.4 7.6  NEUTROABS  --  6.1  --   HGB 12.0 9.9* 9.6*  HCT 38.1 31.1* 29.8*  MCV 87.6 87.1 84.4  PLT 235 247 267   Microbiology: Results for orders placed or performed during the hospital encounter of 07/16/23  Blood culture (routine x 2)     Status: None   Collection Time: 07/15/23 10:25 PM   Specimen: BLOOD  Result Value Ref Range Status   Specimen Description BLOOD BLOOD RIGHT WRIST  Final   Special Requests   Final    BOTTLES DRAWN AEROBIC ONLY Blood Culture results may not be optimal due to an inadequate volume of blood received in culture bottles   Culture   Final    NO GROWTH 5  DAYS Performed at [facility], [address], [place], [place] [address]    Report Status 07/20/2023 FINAL  Final  Blood culture (routine x 2)     Status: Abnormal   Collection Time: 07/15/23 10:25 PM   Specimen: BLOOD  Result Value Ref Range Status   Specimen Description   Final    BLOOD BLOOD LEFT WRIST Performed at [facility], [address] [place], [place] [address]    Special Requests   Final    BOTTLES DRAWN AEROBIC ONLY Blood Culture results may not be optimal due to an inadequate volume of blood received in culture bottles Performed at [facility], [address] [place], [place] [address]    Culture  Setup Time   Final    GRAM POSITIVE COCCI AEROBIC BOTTLE ONLY Organism ID to follow  STAPHYLOCOCCUS SPECIES CRITICAL RESULT CALLED TO, READ BACK BY AND VERIFIED WITH: JASON ROBBINS @ 0126 07/17/23 [facility] Performed at [facility], [address] [place], [place] [address]    Culture (A)  Final    STAPHYLOCOCCUS CAPITIS COAGULASE NEGATIVE STAPHYLOCOCCUS THE SIGNIFICANCE OF ISOLATING THIS ORGANISM FROM A SINGLE SET OF BLOOD CULTURES WHEN MULTIPLE SETS ARE DRAWN IS UNCERTAIN. PLEASE NOTIFY THE MICROBIOLOGY DEPARTMENT WITHIN ONE WEEK IF SPECIATION AND SENSITIVITIES ARE REQUIRED. Performed at [facility], [address] [place], [place] [address]    Report Status 07/21/2023 FINAL  Final  Blood Culture ID Panel (Reflexed)     Status: Abnormal   Collection Time: 07/15/23 10:25 PM  Result Value Ref Range Status   Enterococcus faecalis NOT DETECTED NOT DETECTED Final   Enterococcus Faecium NOT DETECTED NOT DETECTED Final   Listeria monocytogenes NOT DETECTED NOT DETECTED Final   Staphylococcus species DETECTED (A) NOT DETECTED Final    Comment: CRITICAL RESULT CALLED TO, READ BACK BY AND VERIFIED WITH: JASON ROBBINS @ 0122 07/17/23 [facility]    Staphylococcus aureus (BCID) NOT DETECTED NOT DETECTED Final   Staphylococcus epidermidis NOT  DETECTED NOT DETECTED Final   Staphylococcus lugdunensis NOT DETECTED NOT DETECTED Final   Streptococcus species NOT DETECTED NOT DETECTED Final   Streptococcus agalactiae NOT DETECTED NOT DETECTED Final   Streptococcus pneumoniae NOT DETECTED NOT DETECTED Final   Streptococcus pyogenes NOT DETECTED NOT DETECTED Final   A.calcoaceticus-baumannii NOT DETECTED NOT DETECTED Final   Bacteroides fragilis NOT DETECTED NOT DETECTED Final   Enterobacterales NOT DETECTED NOT DETECTED Final   Enterobacter cloacae complex NOT DETECTED NOT DETECTED Final   Escherichia coli NOT DETECTED NOT DETECTED Final   Klebsiella aerogenes NOT DETECTED NOT DETECTED Final   Klebsiella oxytoca NOT DETECTED NOT DETECTED Final   Klebsiella pneumoniae NOT DETECTED NOT DETECTED Final   Proteus species NOT DETECTED NOT DETECTED Final   Salmonella species NOT DETECTED NOT DETECTED Final   Serratia marcescens NOT DETECTED NOT DETECTED Final   Haemophilus influenzae NOT DETECTED NOT DETECTED Final   Neisseria meningitidis NOT DETECTED NOT DETECTED Final   Pseudomonas aeruginosa NOT DETECTED NOT DETECTED Final   Stenotrophomonas maltophilia NOT DETECTED NOT DETECTED Final   Candida albicans NOT DETECTED NOT DETECTED Final   Candida auris NOT DETECTED NOT DETECTED Final   Candida glabrata NOT DETECTED NOT DETECTED Final   Candida krusei NOT DETECTED NOT DETECTED Final   Candida parapsilosis NOT DETECTED NOT DETECTED Final   Candida tropicalis NOT DETECTED NOT DETECTED Final   Cryptococcus neoformans/gattii NOT DETECTED NOT DETECTED Final    Comment: Performed at [facility], [address] [place], [place] [address]  Remove and replace urinary cath (placed > 5 days) then obtain urine culture from new indwelling urinary catheter.     Status: Abnormal   Collection Time: 07/16/23  2:44 AM   Specimen: Urine, Catheterized  Result Value Ref Range Status   Specimen Description   Final    URINE,  CATHETERIZED Performed at [facility], [address], [place], [place] [address]    Special Requests   Final    NONE Performed at [facility], [address] [place], [place] [address]    Culture (A)  Final    60,000 COLONIES/mL KLEBSIELLA PNEUMONIAE 80,000 COLONIES/mL PROVIDENCIA STUARTII 60,000 COLONIES/mL ENTEROCOCCUS FAECALIS Confirmed Extended Spectrum Beta-Lactamase Producer (ESBL).  In bloodstream infections from ESBL organisms, carbapenems are preferred over piperacillin /tazobactam. They are shown to have a  lower risk of mortality. VANCOMYCIN  RESISTANT ENTEROCOCCUS Results Called to: RN GREIG BASQUES [phone] BY JINNY COMMON, [place] Performed at [facility], [address] [place], [place] [address]    Report Status 07/21/2023 FINAL  Final   Organism ID, Bacteria KLEBSIELLA PNEUMONIAE (A)  Final   Organism ID, Bacteria PROVIDENCIA STUARTII (A)  Final   Organism ID, Bacteria ENTEROCOCCUS FAECALIS (A)  Final      Susceptibility   Enterococcus faecalis - MIC*    AMPICILLIN  <=2 SENSITIVE Sensitive     NITROFURANTOIN <=16 SENSITIVE Sensitive     VANCOMYCIN  >=32 RESISTANT Resistant     * 60,000 COLONIES/mL ENTEROCOCCUS FAECALIS   Klebsiella pneumoniae - MIC*    AMPICILLIN  >=32 RESISTANT Resistant     CEFAZOLIN  >=64 RESISTANT Resistant     CEFEPIME  >=32 RESISTANT Resistant     CEFTRIAXONE  >=64 RESISTANT Resistant     CIPROFLOXACIN >=4 RESISTANT Resistant     GENTAMICIN >=16 RESISTANT Resistant     IMIPENEM <=0.25 SENSITIVE Sensitive     NITROFURANTOIN 64 INTERMEDIATE Intermediate     TRIMETH/SULFA >=320 RESISTANT Resistant     AMPICILLIN /SULBACTAM >=32 RESISTANT Resistant     PIP/TAZO 16 SENSITIVE Sensitive ug/mL    * 60,000 COLONIES/mL KLEBSIELLA PNEUMONIAE   Providencia stuartii - MIC*    AMPICILLIN  >=32 RESISTANT Resistant     CEFEPIME  0.25 SENSITIVE Sensitive     CEFTRIAXONE  0.5 SENSITIVE Sensitive     CIPROFLOXACIN >=4 RESISTANT Resistant      GENTAMICIN RESISTANT Resistant     IMIPENEM 1 SENSITIVE Sensitive     NITROFURANTOIN 256 RESISTANT Resistant     TRIMETH/SULFA >=320 RESISTANT Resistant     AMPICILLIN /SULBACTAM >=32 RESISTANT Resistant     PIP/TAZO 8 SENSITIVE Sensitive ug/mL    * 80,000 COLONIES/mL PROVIDENCIA STUARTII  Resp panel by RT-PCR (RSV, Flu A&B, Covid) Urine, Catheterized     Status: None   Collection Time: 07/16/23  2:44 AM   Specimen: Urine, Catheterized; Nasal Swab  Result Value Ref Range Status   SARS Coronavirus 2 by RT PCR NEGATIVE NEGATIVE Final    Comment: (NOTE) SARS-CoV-2 target nucleic acids are NOT DETECTED.  The SARS-CoV-2 RNA is generally detectable in upper respiratory specimens during the acute phase of infection. The lowest concentration of SARS-CoV-2 viral copies this assay can detect is 138 copies/mL. A negative result does not preclude SARS-Cov-2 infection and should not be used as the sole basis for treatment or other patient management decisions. A negative result may occur with  improper specimen collection/handling, submission of specimen other than nasopharyngeal swab, presence of viral mutation(s) within the areas targeted by this assay, and inadequate number of viral copies(<138 copies/mL). A negative result must be combined with clinical observations, patient history, and epidemiological information. The expected result is Negative.  Fact Sheet for Patients:  bloggercourse.com  Fact Sheet for Healthcare Providers:  seriousbroker.it  This test is no t yet approved or cleared by the [place]  FDA and  has been authorized for detection and/or diagnosis of SARS-CoV-2 by FDA under an Emergency Use Authorization (EUA). This EUA will remain  in effect (meaning this test can be used) for the duration of the COVID-19 declaration under Section 564(b)(1) of the Act, 21 U.S.C.section 360bbb-3(b)(1), unless the authorization is  terminated  or revoked sooner.       Influenza A by PCR NEGATIVE NEGATIVE Final   Influenza B by PCR NEGATIVE NEGATIVE Final    Comment: (NOTE) The Xpert Xpress SARS-CoV-2/FLU/RSV  plus assay is intended as an aid in the diagnosis of influenza from Nasopharyngeal swab specimens and should not be used as a sole basis for treatment. Nasal washings and aspirates are unacceptable for Xpert Xpress SARS-CoV-2/FLU/RSV testing.  Fact Sheet for Patients: bloggercourse.com  Fact Sheet for Healthcare Providers: seriousbroker.it  This test is not yet approved or cleared by the [place]  FDA and has been authorized for detection and/or diagnosis of SARS-CoV-2 by FDA under an Emergency Use Authorization (EUA). This EUA will remain in effect (meaning this test can be used) for the duration of the COVID-19 declaration under Section 564(b)(1) of the Act, 21 U.S.C. section 360bbb-3(b)(1), unless the authorization is terminated or revoked.     Resp Syncytial Virus by PCR NEGATIVE NEGATIVE Final    Comment: (NOTE) Fact Sheet for Patients: bloggercourse.com  Fact Sheet for Healthcare Providers: seriousbroker.it  This test is not yet approved or cleared by the [place]  FDA and has been authorized for detection and/or diagnosis of SARS-CoV-2 by FDA under an Emergency Use Authorization (EUA). This EUA will remain in effect (meaning this test can be used) for the duration of the COVID-19 declaration under Section 564(b)(1) of the Act, 21 U.S.C. section 360bbb-3(b)(1), unless the authorization is terminated or revoked.  Performed at [facility], [address] [place], [place] [address]   MRSA Next Gen by PCR, Nasal     Status: Abnormal   Collection Time: 07/16/23  8:38 AM   Specimen: Nasal Mucosa; Nasal Swab  Result Value Ref Range Status   MRSA by PCR Next Gen DETECTED (A)  NOT DETECTED Final    Comment: RESULT CALLED TO, READ BACK BY AND VERIFIED WITH: DEE MMCLAIN 07/16/23 1002 MW (NOTE) The GeneXpert MRSA Assay (FDA approved for NASAL specimens only), is one component of a comprehensive MRSA colonization surveillance program. It is not intended to diagnose MRSA infection nor to guide or monitor treatment for MRSA infections. Test performance is not FDA approved in patients less than 34 years old. Performed at [facility], [address] [place], [place] [address]    Time coordinating discharge: Over 30 minutes  Marien LITTIE Piety, MD  [facility] 07/22/2023, 9:33 AM

## 2023-07-22 NOTE — Plan of Care (Signed)

## 2023-07-22 NOTE — TOC Transition Note (Signed)
 Transition of Care [facility]) - Discharge Note   Patient Details  Name: Wendy Nelson MRN: [id number] Date of Birth: 1965-09-07  [facility]) CM/SW Contact:  Pheobe Sandiford A Dianara Smullen, RN Phone Number: 07/22/2023, 12:18 PM   Clinical Narrative:    Chart reviewed.  Noted that patient has orders for discharge today.  Noted that patient is Long term Care resident of [facility].  I have spoken with Tammy, Admission Coordinator at [facility].  She informs me that patient is able to come to accept patient to the facility today.  Tammy reports that number to call report is [phone] and patient will be going to room 209A.  I have sent Discharge Summary, Discharge orders, and SNF transfer report to [facility] via [employer].    I have informed patient's mother Louelle Day that patient patient will be discharging to [facility] today.  I have informed Mrs. Mandel that [facility] will transport patient to the facility today.    I have arranged for [facility] to transport patient to [facility] today.    I have informed staff nurse of the above information.    Final next level of care: Skilled Nursing Facility Barriers to Discharge: No Barriers Identified   Patient Goals and CMS Choice            Discharge Placement              Patient chooses bed at:  (Long term care Resident at [facility]) Patient to be transferred to facility by: [facility] Name of family member notified: Bonnie Day ( patient's mother) Patient and family notified of of transfer: 07/22/23  Discharge Plan and Services Additional resources added to the After Visit Summary for     Discharge Planning Services: CM Consult            DME Arranged: N/A DME Agency: NA       HH Arranged: NA HH Agency: NA        Social Drivers of Health (SDOH) Interventions SDOH Screenings   Food Insecurity: No Food Insecurity (07/18/2023)  Housing: Low Risk  (07/18/2023)   Transportation Needs: No Transportation Needs (07/18/2023)  Utilities: Not At Risk (07/18/2023)  Financial Resource Strain: Low Risk  (01/20/2023)   Received from [facility]  Tobacco Use: Low Risk  (07/15/2023)  Recent Concern: Tobacco Use - Medium Risk (06/24/2023)   Received from [facility]     Readmission Risk Interventions     No data to display

## 2023-12-22 ENCOUNTER — Emergency Department
Admission: EM | Admit: 2023-12-22 | Discharge: 2023-12-23 | Disposition: A | Discharge status: Skilled Nursing Facility | Attending: Emergency Medicine | Admitting: Emergency Medicine

## 2023-12-22 ENCOUNTER — Other Ambulatory Visit: Payer: Self-pay

## 2023-12-22 DIAGNOSIS — I2510 Atherosclerotic heart disease of native coronary artery without angina pectoris: Secondary | ICD-10-CM | POA: Diagnosis not present

## 2023-12-22 DIAGNOSIS — Z7901 Long term (current) use of anticoagulants: Secondary | ICD-10-CM | POA: Diagnosis not present

## 2023-12-22 DIAGNOSIS — N390 Urinary tract infection, site not specified: Secondary | ICD-10-CM | POA: Insufficient documentation

## 2023-12-22 DIAGNOSIS — I10 Essential (primary) hypertension: Secondary | ICD-10-CM | POA: Diagnosis not present

## 2023-12-22 DIAGNOSIS — Z960 Presence of urogenital implants: Secondary | ICD-10-CM | POA: Insufficient documentation

## 2023-12-22 DIAGNOSIS — G8250 Quadriplegia, unspecified: Secondary | ICD-10-CM | POA: Diagnosis not present

## 2023-12-22 DIAGNOSIS — R1030 Lower abdominal pain, unspecified: Secondary | ICD-10-CM | POA: Insufficient documentation

## 2023-12-22 DIAGNOSIS — Z8673 Personal history of transient ischemic attack (TIA), and cerebral infarction without residual deficits: Secondary | ICD-10-CM | POA: Diagnosis not present

## 2023-12-22 DIAGNOSIS — E119 Type 2 diabetes mellitus without complications: Secondary | ICD-10-CM | POA: Diagnosis not present

## 2023-12-22 DIAGNOSIS — R110 Nausea: Secondary | ICD-10-CM | POA: Insufficient documentation

## 2023-12-22 DIAGNOSIS — Z86711 Personal history of pulmonary embolism: Secondary | ICD-10-CM | POA: Insufficient documentation

## 2023-12-22 LAB — COMPREHENSIVE METABOLIC PANEL WITH GFR
ALT: 19 U/L (ref 0–44)
AST: 20 U/L (ref 15–41)
Albumin: 3.1 g/dL — ABNORMAL LOW (ref 3.5–5.0)
Alkaline Phosphatase: 94 U/L (ref 38–126)
Anion gap: 9 (ref 5–15)
BUN: 12 mg/dL (ref 6–20)
CO2: 30 mmol/L (ref 22–32)
Calcium: 8.8 mg/dL — ABNORMAL LOW (ref 8.9–10.3)
Chloride: 102 mmol/L (ref 98–111)
Creatinine, Ser: 0.76 mg/dL (ref 0.44–1.00)
GFR, Estimated: >60 mL/min (ref >60)
Glucose, Bld: 97 mg/dL (ref 70–99)
Potassium: 3.9 mmol/L (ref 3.5–5.1)
Sodium: 141 mmol/L (ref 135–145)
Total Bilirubin: 0.5 mg/dL (ref 0.0–1.2)
Total Protein: 7 g/dL (ref 6.5–8.1)

## 2023-12-22 LAB — CBC
HCT: 38.8 % (ref 36.0–46.0)
Hemoglobin: 12.3 g/dL (ref 12.0–15.0)
MCH: 27.5 pg (ref 26.0–34.0)
MCHC: 31.7 g/dL (ref 30.0–36.0)
MCV: 86.8 fL (ref 80.0–100.0)
Platelets: 342 10*3/uL (ref 150–400)
RBC: 4.47 MIL/uL (ref 3.87–5.11)
RDW: 16.1 % — ABNORMAL HIGH (ref 11.5–15.5)
WBC: 8.2 10*3/uL (ref 4.0–10.5)
nRBC: 0 % (ref 0.0–0.2)

## 2023-12-22 LAB — LIPASE, BLOOD: Lipase: 28 U/L (ref 11–51)

## 2023-12-22 MED ORDER — ONDANSETRON HCL 4 MG/2ML IJ SOLN
4.0000 mg | INTRAMUSCULAR | Status: AC
  Administered 2023-12-23: 4 mg via INTRAVENOUS
  Filled 2023-12-22: qty 2

## 2023-12-22 NOTE — ED Notes (Addendum)
 BIB [facility] from [facility]. Pt called 911 herself for feeling nauseated all day. Staff did not call EMS. Pt is taken prescribed Zofran . Pt is been able to eat and drink normally per staff. Pt is quadraplegic and morbidly obese. Pt has foley. Per pt "I hit my call bell and they didn't answer it (staff of facility) so I called 911".

## 2023-12-22 NOTE — ED Provider Notes (Signed)
 [facility] Provider Note    None    (approximate)   History   Nausea   HPI  Wendy Nelson is a 58 y.o. female CVA, chronic quadriplegia, coronary artery disease, type 2 diabetes mellitus, hypertension.  Review of prior hospitalization notes to note that she had a recent pulmonary embolism treated with DOAC, as well as a urinary tract infection concerning for ESBL with significant resistance profile  Patient reports today she noticed feeling nausea and a "clammy" feeling.  She reports that the stresses of the day.  She took Zofran  at [facility].  She is compliant with her medications including her blood thinner.  She has not had any headache cough or difficulty breathing.  She relates the symptoms feel very similar and she has had same symptoms whenever she starts to get urinary tract infections.  She does have a Foley catheter       Physical Exam   Triage Vital Signs: ED Triage Vitals  Encounter Vitals Group     BP 12/22/23 2113 117/73     Systolic BP Percentile --      Diastolic BP Percentile --      Pulse Rate 12/22/23 2113 98     Resp 12/22/23 2113 16     Temp 12/22/23 2113 97.9 F (36.6 C)     Temp Source 12/22/23 2113 Oral     SpO2 12/22/23 2113 98 %     Weight 12/22/23 2111 285 lb (129.3 kg)     Height --      Head Circumference --      [facility] --      Pain Score 12/22/23 2111 0     Pain Loc --      Pain Education --      Exclude from Growth Chart --     Most recent vital signs: Vitals:   12/23/23 0230 12/23/23 0300  BP: 131/89 (!) 131/93  Pulse: 71 85  Resp: 13 13  Temp:    SpO2: 94% 94%  Vital signs noted to be normal on arrival   General: Awake, no distress.  Very pleasant. CV:  Good peripheral perfusion.  Normal tones and rate Resp:  Normal effort.  Clear bilateral slightly diminished in the bases without distress Abd:  No distention.  She reports a slight discomfort or nauseating feeling to palpation in the lower  abdomen without rebound or guarding.  Denies any diarrhea or vomiting Other:  Paraplegia lower extremities chronic.  The patient does have some use the upper extremities bilateral and reports that that is normal for her.  She has no focal deficits she is fully awake alert oriented and pleasant.  She reports she would like some additional nausea medicine but nothing for pain.  Patient relates her symptoms feel very much the same as when she had urine infection  ED Results / Procedures / Treatments   Labs (all labs ordered are listed, but only abnormal results are displayed) Labs Reviewed  COMPREHENSIVE METABOLIC PANEL WITH GFR - Abnormal; Notable for the following components:      Result Value   Calcium  8.8 (*)    Albumin 3.1 (*)    All other components within normal limits  CBC - Abnormal; Notable for the following components:   RDW 16.1 (*)    All other components within normal limits  URINALYSIS, ROUTINE W REFLEX MICROSCOPIC - Abnormal; Notable for the following components:   Color, Urine YELLOW (*)    APPearance  CLOUDY (*)    pH 9.0 (*)    Hgb urine dipstick SMALL (*)    Protein, ur 100 (*)    Leukocytes,Ua MODERATE (*)    Bacteria, UA MANY (*)    All other components within normal limits  PROTIME-INR - Abnormal; Notable for the following components:   Prothrombin Time 19.0 (*)    INR 1.6 (*)    All other components within normal limits  CULTURE, BLOOD (ROUTINE X 2)  CULTURE, BLOOD (ROUTINE X 2)  RESP PANEL BY RT-PCR (RSV, FLU A&B, COVID)  RVPGX2  URINE CULTURE  LIPASE, BLOOD  LACTIC ACID, PLASMA  LACTIC ACID, PLASMA  URINALYSIS, W/ REFLEX TO CULTURE (INFECTION SUSPECTED)  TROPONIN I (HIGH SENSITIVITY)     EKG  EKG inter by me at 215 heart rate 70 QRS 90 QTc 460 Normal sinus rhythm T wave inversion noted in V2 and slight depression in V3.  Compared with previous EKG from admission in December 30 no acute changes found.  No evidence of acute cardiac change.  Patient  notably also not having chest pain   RADIOLOGY  CT abdomen pelvis interpreted by me as grossly negative for obvious acute new pathology   CT ABDOMEN PELVIS W CONTRAST Result Date: 12/23/2023 CLINICAL DATA:  Nausea EXAM: CT ABDOMEN AND PELVIS WITH CONTRAST TECHNIQUE: Multidetector CT imaging of the abdomen and pelvis was performed using the standard protocol following bolus administration of intravenous contrast. RADIATION DOSE REDUCTION: This exam was performed according to the departmental dose-optimization program which includes automated exposure control, adjustment of the mA and/or kV according to patient size and/or use of iterative reconstruction technique. CONTRAST:  OMNIPAQUE  IOHEXOL  350 MG/ML SOLN COMPARISON:  07/16/2023 FINDINGS: Lower chest: No acute abnormality. Hepatobiliary: No focal liver abnormality is seen. No gallstones, gallbladder wall thickening, or biliary dilatation. Pancreas: Unremarkable. No pancreatic ductal dilatation or surrounding inflammatory changes. Spleen: Normal in size without focal abnormality. Adrenals/Urinary Tract: Adrenal glands are within normal limits. Kidneys are well visualized bilaterally. No renal calculi or obstructive changes are seen. Mild scarring in the right kidney is noted the bladder is decompressed by Foley catheter. Stomach/Bowel: Fecal material is noted scattered throughout the colon. No obstructive or inflammatory changes are seen. The appendix is well visualized and within normal limits. Small bowel and stomach are within normal limits. Vascular/Lymphatic: Aortic atherosclerosis. No enlarged abdominal or pelvic lymph nodes. Reproductive: Uterus and bilateral adnexa are unremarkable. Other: No abdominal wall hernia or abnormality. No abdominopelvic ascites. Musculoskeletal: No acute or significant osseous findings. IMPRESSION: No acute abnormality to correspond with the given clinical history. Electronically Signed   By: Violeta Grey M.D.   On:  12/23/2023 01:51   DG Chest Port 1 View Result Date: 12/23/2023 CLINICAL DATA:  Possible sepsis EXAM: PORTABLE CHEST 1 VIEW COMPARISON:  07/16/2023 FINDINGS: Cardiac shadow is enlarged but stable. The lungs are well aerated bilaterally. Minimal blunting of the right costophrenic angle is again noted likely of a chronic nature. Postsurgical changes in the cervical spine are seen. IMPRESSION: No acute abnormality noted.  Chronic scarring in the right base. Electronically Signed   By: Violeta Grey M.D.   On: 12/23/2023 00:34         PROCEDURES:  Critical Care performed: No  Procedures   MEDICATIONS ORDERED IN ED: Medications  ondansetron  (ZOFRAN ) injection 4 mg (4 mg Intravenous Given 12/23/23 0211)  iohexol  (OMNIPAQUE ) 350 MG/ML injection 100 mL (100 mLs Intravenous Contrast Given 12/23/23 0129)  fosfomycin (MONUROL ) packet 3  g (3 g Oral Given 12/23/23 0326)  cefixime (SUPRAX) capsule 400 mg (400 mg Oral Given 12/23/23 0326)     IMPRESSION / MDM / ASSESSMENT AND PLAN / ED COURSE  I reviewed the triage vital signs and the nursing notes.                              Differential diagnosis includes, but is not limited to, urinary tract infection, complicated infection, pyelonephritis, electrolyte abnormality, dehydration less likely with no associated chest pain or pulmonary symptoms causes such as ACS, NSTEMI, silent MI.  She has no neurologic symptoms she is at her normal neurologic baseline of paraplegia.  Patient's presentation is most consistent with acute complicated illness / injury requiring diagnostic workup.      Clinical Course as of 12/23/23 0517  Mon Dec 23, 2023  0210 Extensive conversation with PharmD Reymundo Caulk re: prior culture data. Recommend Suprax 400mg  Qday for 7 days and fosfomycin and and repeat in 72 hours.  [MQ]  0216 Patient resting comfortably symptoms well-controlled.  Plan to exchange her Foley catheter for new 1 and will discharge to her nursing facility with  Suprax and fosfomycin.  Patient agreeable with this plan and follow-up with her care team at her facility.  Hemodynamic stable no evidence of sepsis. [MQ]    Clinical Course User Index [MQ] Iver Marker, MD   Patient's workup thus far reassuring.  Normal white count, no fever.  She does have a urinary catheter and urinalysis concerning for UTI.  Complex history of previous urinary pathology.  Discussed at length with our pharmacist, and patient will be started on Suprax and fosfomycin.  She is under the care of Drs. Making housecalls and in a nursing facility.  They will be able to continue her on prescribed medication and monitor her closely.  At this point she does not require hospitalization and has no evidence of sepsis.  The patient very agreeable with plan to return to her facility, antibiotics provided, and follow-up with her physician team at [facility].  A Foley catheter exchanged  Return precautions and treatment recommendations and follow-up discussed with the patient who is agreeable with the plan.   FINAL CLINICAL IMPRESSION(S) / ED DIAGNOSES   Final diagnoses:  Urinary tract infection associated with indwelling urethral catheter, initial encounter [facility])     Rx / DC Orders   ED Discharge Orders          Ordered    fosfomycin (MONUROL ) 3 g PACK   Once       Note to Pharmacy: Administer on December 26, 2023   12/23/23 0502    cefixime (SUPRAX) 400 MG CAPS capsule  Daily        12/23/23 0502             Note:  This document was prepared using Dragon voice recognition software and may include unintentional dictation errors.   Iver Marker, MD 12/23/23 [id number]

## 2023-12-23 ENCOUNTER — Emergency Department

## 2023-12-23 DIAGNOSIS — R110 Nausea: Secondary | ICD-10-CM | POA: Diagnosis not present

## 2023-12-23 LAB — TROPONIN I (HIGH SENSITIVITY): Troponin I (High Sensitivity): 3 ng/L (ref <18)

## 2023-12-23 LAB — RESP PANEL BY RT-PCR (RSV, FLU A&B, COVID)  RVPGX2
Influenza A by PCR: NEGATIVE
Influenza B by PCR: NEGATIVE
Resp Syncytial Virus by PCR: NEGATIVE
SARS Coronavirus 2 by RT PCR: NEGATIVE

## 2023-12-23 LAB — URINALYSIS, ROUTINE W REFLEX MICROSCOPIC
Bilirubin Urine: NEGATIVE
Glucose, UA: NEGATIVE mg/dL
Ketones, ur: NEGATIVE mg/dL
Nitrite: NEGATIVE
Protein, ur: 100 mg/dL — AB
Specific Gravity, Urine: 1.008 (ref 1.005–1.030)
pH: 9 — ABNORMAL HIGH (ref 5.0–8.0)

## 2023-12-23 LAB — PROTIME-INR
INR: 1.6 — ABNORMAL HIGH (ref 0.8–1.2)
Prothrombin Time: 19 s — ABNORMAL HIGH (ref 11.4–15.2)

## 2023-12-23 LAB — LACTIC ACID, PLASMA
Lactic Acid, Venous: 1.2 mmol/L (ref 0.5–1.9)
Lactic Acid, Venous: 1.1 mmol/L (ref 0.5–1.9)

## 2023-12-23 MED ORDER — FOSFOMYCIN TROMETHAMINE 3 G PO PACK
3.0000 g | PACK | ORAL | Status: AC
  Administered 2023-12-23: 3 g via ORAL
  Filled 2023-12-23: qty 3

## 2023-12-23 MED ORDER — IOHEXOL 350 MG/ML SOLN
100.0000 mL | Freq: Once | INTRAVENOUS | Status: AC | PRN
  Administered 2023-12-23: 100 mL via INTRAVENOUS

## 2023-12-23 MED ORDER — CEFIXIME 400 MG PO CAPS: 400.0000 mg | ORAL_CAPSULE | Freq: Every day | ORAL | 0 refills | Status: AC

## 2023-12-23 MED ORDER — FOSFOMYCIN TROMETHAMINE 3 G PO PACK: 3.0000 g | PACK | Freq: Once | ORAL | 0 refills | Status: AC

## 2023-12-23 MED ORDER — CEFIXIME 400 MG PO CAPS
400.0000 mg | ORAL_CAPSULE | Freq: Once | ORAL | Status: AC
  Administered 2023-12-23: 400 mg via ORAL
  Filled 2023-12-23: qty 1

## 2023-12-26 LAB — URINE CULTURE: Culture: >=100000 — AB

## 2023-12-28 LAB — CULTURE, BLOOD (ROUTINE X 2)
Culture: NO GROWTH
Culture: NO GROWTH

## 2024-02-19 ENCOUNTER — Emergency Department
Admission: EM | Admit: 2024-02-19 | Discharge: 2024-02-19 | Disposition: A | Discharge status: Home / Self Care | Attending: Emergency Medicine | Admitting: Emergency Medicine

## 2024-02-19 ENCOUNTER — Other Ambulatory Visit: Payer: Self-pay

## 2024-02-19 DIAGNOSIS — Z466 Encounter for fitting and adjustment of urinary device: Secondary | ICD-10-CM | POA: Insufficient documentation

## 2024-02-19 NOTE — ED Triage Notes (Signed)
 [facility], coming from [facility]. Pt needs a new catheter. Facility attempted to place new catheter but was getting too much resistance. Pt is a paraplegic. Pt is also constipated- Pt had ABX XRAY, impression at bedside. Pt reports small BM yesterday. VSS. GCS 15.

## 2024-02-19 NOTE — ED Notes (Signed)
 [facility] arrived for patient transport to [facility]

## 2024-02-19 NOTE — ED Provider Notes (Signed)
   [facility] Provider Note    Event Date/Time   First MD Initiated Contact with Patient 02/19/24 [phone]     (approximate)   History   Catheter placement   HPI  Wendy Nelson is a 58 y.o. 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  They apparently tried to change Foley catheter but encountered resistance to send her to the emergency department, she has no other complaints.     Physical Exam   Triage Vital Signs: ED Triage Vitals  Encounter Vitals Group     BP 02/19/24 0742 100/88     Girls Systolic BP Percentile --      Girls Diastolic BP Percentile --      Boys Systolic BP Percentile --      Boys Diastolic BP Percentile --      Pulse Rate 02/19/24 0742 86     Resp 02/19/24 0742 16     Temp 02/19/24 0742 98.1 F (36.7 C)     Temp Source 02/19/24 0742 Oral     SpO2 02/19/24 0742 95 %     Weight --      Height --      Head Circumference --      [facility] --      Pain Score 02/19/24 0741 0     Pain Loc --      Pain Education --      Exclude from Growth Chart --     Most recent vital signs: Vitals:   02/19/24 0742  BP: 100/88  Pulse: 86  Resp: 16  Temp: 98.1 F (36.7 C)  SpO2: 95%     General: Awake, no distress.  CV:  Good peripheral perfusion.  Resp:  Normal effort.  Abd:  No distention.  Soft, nontender Other:     ED Results / Procedures / Treatments   Labs (all labs ordered are listed, but only abnormal results are displayed) Labs Reviewed - No data to display   EKG     RADIOLOGY     PROCEDURES:  Critical Care performed:   Procedures   MEDICATIONS ORDERED IN ED: Medications - No data to display   IMPRESSION / MDM / ASSESSMENT AND PLAN / ED COURSE  I reviewed the triage vital signs and the nursing notes. Patient's presentation is most consistent with acute, uncomplicated illness.  We will  change Foley catheter here.        FINAL CLINICAL IMPRESSION(S) / ED DIAGNOSES   Final diagnoses:  Encounter for Foley catheter replacement     Rx / DC Orders   ED Discharge Orders     None        Note:  This document was prepared using Dragon voice recognition software and may include unintentional dictation errors.   Arlander Charleston, MD 02/19/24 [phone]

## 2024-03-11 ENCOUNTER — Other Ambulatory Visit: Payer: Self-pay

## 2024-03-11 ENCOUNTER — Emergency Department
Admission: EM | Admit: 2024-03-11 | Discharge: 2024-03-11 | Disposition: A | Discharge status: Skilled Nursing Facility | Attending: Emergency Medicine | Admitting: Emergency Medicine

## 2024-03-11 DIAGNOSIS — Z4682 Encounter for fitting and adjustment of non-vascular catheter: Secondary | ICD-10-CM | POA: Insufficient documentation

## 2024-03-11 DIAGNOSIS — Z978 Presence of other specified devices: Secondary | ICD-10-CM

## 2024-03-11 DIAGNOSIS — T83091A Other mechanical complication of indwelling urethral catheter, initial encounter: Secondary | ICD-10-CM | POA: Diagnosis present

## 2024-03-11 DIAGNOSIS — T839XXA Unspecified complication of genitourinary prosthetic device, implant and graft, initial encounter: Secondary | ICD-10-CM

## 2024-03-11 DIAGNOSIS — Z8673 Personal history of transient ischemic attack (TIA), and cerebral infarction without residual deficits: Secondary | ICD-10-CM | POA: Diagnosis not present

## 2024-03-11 NOTE — ED Triage Notes (Signed)
 Pt in from [facility] for foley catheter issues. Per pt this happens often with the foley needing to be changed at times. Per pt the facility has issues when they attempt to reinsert catheter so they sent her here. All vitals WDL. Pt A&Ox4.

## 2024-03-11 NOTE — ED Notes (Signed)
 Catheter irrigated at this time. No resistance when irrigating, No urine return.

## 2024-03-11 NOTE — ED Provider Notes (Signed)
 [facility] Emergency Department Provider Note     Event Date/Time   First MD Initiated Contact with Patient 03/11/24 1354     (approximate)   History   No chief complaint on file.   HPI  Wendy Nelson is a 58 y.o. female with a history of quadriplegia, stroke, obesity, neurogenic bladder with chronic indwelling Foley catheter, presents from her facility.  She presents via EMS from [facility], with reports that her Foley has not drained appropriately since last night.  Patient was last seen here in the ED about 3 weeks prior, and had a new Foley placed at that time.  The Foley catheter is in place, but appears to not be draining as expected.  Patient reports some suprapubic pressure.  No fever, chills, or sweats reported.   Physical Exam   Triage Vital Signs: ED Triage Vitals  Encounter Vitals Group     BP 03/11/24 1405 109/81     Girls Systolic BP Percentile --      Girls Diastolic BP Percentile --      Boys Systolic BP Percentile --      Boys Diastolic BP Percentile --      Pulse Rate 03/11/24 1405 71     Resp 03/11/24 1405 14     Temp 03/11/24 1405 97.8 F (36.6 C)     Temp Source 03/11/24 1405 Oral     SpO2 03/11/24 1405 98 %     Weight 03/11/24 1406 283 lb (128.4 kg)     Height 03/11/24 1406 5' 5 (1.651 m)     Head Circumference --      [facility] --      Pain Score 03/11/24 1405 4     Pain Loc --      Pain Education --      Exclude from Growth Chart --     Most recent vital signs: Vitals:   03/11/24 1405  BP: 109/81  Pulse: 71  Resp: 14  Temp: 97.8 F (36.6 C)  SpO2: 98%    General Awake, no distress. NAD HEENT NCAT. PERRL. EOMI. No rhinorrhea. Mucous membranes are moist.  CV:  Good peripheral perfusion.  RESP:  Normal effort.  ABD:  No distention.  GU:  Foley catheter in place   ED Results / Procedures / Treatments   Labs (all labs ordered are listed, but only abnormal results are displayed) Labs Reviewed - No  data to display   EKG   RADIOLOGY  No results found.   PROCEDURES:  Critical Care performed: No  Procedures 16 French Foley catheter placed  MEDICATIONS ORDERED IN ED: Medications - No data to display   IMPRESSION / MDM / ASSESSMENT AND PLAN / ED COURSE  I reviewed the triage vital signs and the nursing notes.                              Differential diagnosis includes, but is not limited to, Foley catheter dysfunction, Foley catheter replacement  Patient's presentation is most consistent with acute, uncomplicated illness.   Patient's diagnosis is consistent with dysfunction of her Foley catheter.  Catheter was found to be flushable, without significant return.  The tip Foley catheter which was in place, has been removed and replaced with a new 16 French Foley catheter.  Patient is to follow up with urology as scheduled, as needed or otherwise directed. Patient is given ED precautions to  return to the ED for any worsening or new symptoms.   FINAL CLINICAL IMPRESSION(S) / ED DIAGNOSES   Final diagnoses:  Foley catheter problem, initial encounter [facility])  Status post insertion of Foley catheter     Rx / DC Orders   ED Discharge Orders     None        Note:  This document was prepared using Dragon voice recognition software and may include unintentional dictation errors.    Loyd Candida LULLA Aldona, PA-C 03/11/24 1610    Willo Dunnings, MD 03/11/24 TYRA

## 2024-03-11 NOTE — ED Notes (Signed)
 [facility] here for transport.  Pt alert.

## 2024-03-11 NOTE — Discharge Instructions (Addendum)
 Follow up with urology as scheduled.

## 2024-04-06 NOTE — Progress Notes (Signed)
 Chief Complaint:  Arm Pain and Shoulder Pain   History of Present Illness: Wendy Nelson is an 58 y.o. female seen for a new patient visit for neck complaints with associated bilateral trapezial pain and anterior neck pain.  She has history of C4-6 ACDF with subsequent C5-7 posterior fusion in 2022.  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  She has persistent C7 incomplete SCI.  She is confined to a wheelchair due to her condition.  The current symptoms started several months ago.   She reports no new injury or accident.  She reports 5/10 pain.  The pain is intermittent.  She describes the pain as aching.  The pain is located in the upper trapezius bilaterally (R>>L).  She also reports pain across the front of the neck radiating between her collar bones.  She also reports swelling in her right arm.  She reports weakness in the left shoulder, different than her residual weakness after her spinal cord injury. She reports no new numbness or tingling, or any bowel or bladder symptoms that differ from her baseline.  She has tried tylenol  and lidocaine  patches without relief.  Past Medical History: Past Medical History:  Diagnosis Date  . Abnormal EKG 11/29/2016  . Acute cystitis without hematuria 10/02/2020  . Acute postoperative pain 09/25/2020  . Acute pyelonephritis 02/18/2023  . Acute respiratory failure with hypoxia (CMS/HHS-HCC) 02/18/2023  . AKI (acute kidney injury) () 10/19/2020  . Anxiety due to invasive procedure   . Arm mass, right 05/04/2019  . Bilateral hydronephrosis, unspecified 10/19/2020  . Bilateral leg pain 10/02/2018  . Cancer (CMS/HHS-HCC)    melanona left knee removed in 2002  . Cervical radiculopathy 07/24/2018  . Cervical spondylosis 08/19/2019  . Cervical spondylosis with myelopathy 11/12/2019  . Cervical spondylosis with myelopathy and radiculopathy 09/13/2020  . Chronic pain of left knee 01/12/2019  . Chronic  pain of right knee 08/07/2017  . Coronary artery disease involving native coronary artery of native heart without angina pectoris 03/07/2017  . Diverticulitis 09/25/2018  . Fatty liver 01/08/2018  . Fibroid   . Fibrosis of knee joint 05/27/2019  . Fibrosis of right knee joint 02/06/2017  . GERD (gastroesophageal reflux disease)   . Greater trochanteric pain syndrome 02/02/2023  . Hematologic abnormality Aspirin   . History of lipoma 03/15/2022  . History of non-ST elevation myocardial infarction (NSTEMI) 01/28/2023  . History of smoking 09/12/2020  . History of stroke 09/24/2020   Im paralyzed  . Hyperlipidemia 11/29/2016  . Hypertension   . Hypotension 09/13/2020  . Insomnia 02/25/2017  . Localized, primary osteoarthritis 01/28/2023  . Locking of right elbow 04/13/2019  . Melanoma (CMS/HHS-HCC) 1994  . Needle phobia   . Neurogenic bladder 01/28/2023   Last Assessment & Plan:   Formatting of this note might be different from the original.  Sec. to spinal cord infarct.  Continue Foley    . NSTEMI (non-ST elevated myocardial infarction) (CMS/HHS-HCC) 01/08/2023   Last Assessment & Plan:   Formatting of this note might be different from the original.  CAD  Patient presents with intermittent chest pain, jaw pain and headache.  Troponin 104-114 and EKG with nonspecific ST-T wave changes  Possible demand ischemia if ruled in for UTI  Continue heparin  infusion  Continue aspirin , atorvastatin    Nitroglycerin  sublingual as needed chest pain with morphine  f  . Obesity (BMI 30-39.9), unspecified   . Pain in both feet 08/06/2019  . Primary osteoarthritis of  right knee 12/01/2014  . Radiculopathy of cervical region 08/18/2019  . S/P surgical manipulation of knee joint 04/18/2017  . Sepsis (CMS/HHS-HCC) 12/23/2020  . Sepsis due to gram-negative UTI  (CMS/HHS-HCC) 07/16/2023  . Severe sepsis without septic shock (CMS/HHS-HCC) 07/16/2023  . SIRS (systemic inflammatory response syndrome)  (CMS/HHS-HCC) 01/28/2023   Last Assessment & Plan:   Formatting of this note might be different from the original.  Sepsis suspected.  Continue IV cefepime .  Continue IV fluid as per sepsis protocol.  Treat UTI as outlined above.    . Skin cancer   . Sore throat, unspecified 06/24/2023  . Spinal cord infarction (CMS/HHS-HCC) 09/13/2020  . Sprain of left acromioclavicular ligament 09/01/2019  . Strain of flexor muscle of hip 01/28/2023  . Syncope 10/19/2020  . Tear of right rotator cuff 09/01/2019  . Thyroid  nodule 12/27/2016  . Thyroid  nodule 12/27/2016  . Traumatic hematoma of left shoulder 07/28/2020  . Type 2 diabetes mellitus without complication, without long-term current use of insulin  (CMS/HHS-HCC) 05/09/2023  . Unstable angina pectoris (CMS/HHS-HCC) 12/27/2016  . Vision abnormalities      Past Surgical History: Past Surgical History:  Procedure Laterality Date  . ARTHROPLASTY TOTAL KNEE Right 12/13/2016   Procedure: ARTHROPLASTY, RIGHT KNEE, CONDYLE AND PLATEAU; MEDIAL AND LATERAL COMPARTMENTSWITH OR WITHOUT PATELLA RESURFACING (TOTAL KNEE ARTHROPLASTY);  Surgeon: Jayson Alm Bars, MD;  [facility] OR;  Service: Orthopedics;  Laterality: Right;  . cardiac catheterization  12/2016  . MANIPULATION JOINT UNDER ANESTHESIA KNEE Right 02/07/2017   Procedure: MANIPULATION OF KNEE JOINT UNDER GENERAL ANESTHESIA (INCLUDES APPLICATION OF TRACTION OR OTHER FIXATION DEVICES);  Surgeon: Jayson Alm Bars, MD;  Location: [facility] OR;  Service: Orthopedics;  Laterality: Right;  . ARTHROSCOPY KNEE W/LYSIS ADHESIONS Right 04/18/2017   Procedure: ARTHROSCOPY, RIGHT KNEE, SURGICAL; WITH LYSIS OF ADHESIONS, WITH MANIPULATION (SEPARATE PROCEDURE);  Surgeon: Bars Jayson Alm, MD;  Location: [facility] OR;  Service: Orthopedics;  Laterality: Right;  . MANIPULATION JOINT UNDER ANESTHESIA KNEE Right 04/18/2017   Procedure: MANIPULATION OF RIGHT KNEE JOINT UNDER GENERAL ANESTHESIA (INCLUDES  APPLICATION OF TRACTION OR OTHER FIXATION DEVICES);  Surgeon: Bars Jayson Alm, MD;  Location: [facility] OR;  Service: Orthopedics;  Laterality: Right;  . COLONOSCOPY  2020   diverticuli  . ESOPHAGOGASTRODOUDENOSCOPY W/BIOPSY N/A 01/09/2019   Procedure: EGD;  Surgeon: Merle Norleen Ricardo Mickey., MD;  Location: [facility];  Service: Gastroenterology;  Laterality: N/A;  . NEUROPLASTY &/OR TRANSPOSITION MEDIAN NERVE AT CARPAL TUNNEL Right 01/13/2019   Procedure: NEUROPLASTY AND/OR TRANSPOSITION; MEDIAN NERVE AT CARPAL TUNNEL;  Surgeon: Rocklin Maude Charleston, MD;  Location: [facility] OR;  Service: Neurosurgery;  Laterality: Right;  . EXCISION TUMOR SOFT TISSUE FOREARM &/OR WRIST Right 05/19/2019   Procedure: EXCISION, TUMOR, SOFT TISSUE OF FOREARM AND/OR WRIST;  Surgeon: Harry Alm Shaw, MD;  Location: [facility] OR;  Service: Orthopedics;  Laterality: Right;  . ARTHROSCOPIC ROTATOR CUFF REPAIR Right 09/24/2019   Procedure: R- ARTHROSCOPY, SHOULDER, SURGICAL; WITH ROTATOR CUFF REPAIR;  Surgeon: Klifto, Christopher Scott, MD;  Location: [facility] OR;  Service: Orthopedics;  Laterality: Right;  . ARTHROSCOPY SHOULDER W/DEBRIDEMENT Right 09/24/2019   Procedure: R- ARTHROSCOPY, SHOULDER, SURGICAL; DEBRIDEMENT, EXTENSIVE, 3 OR MORE DISCRETE STRUCTURES;  Surgeon: Sandralee Lonni Hamilton, MD;  Location: [facility] OR;  Service: Orthopedics;  Laterality: Right;  . TENODESIS BICEPS W/TRANSPLANTATION LONG TENDON OPEN Right 09/24/2019   Procedure: R- TENODESIS OF LONG TENDON OF BICEPS;  Surgeon: Klifto, Christopher Scott, MD;  Location: [facility] OR;  Service: Orthopedics;  Laterality: Right;  .  ARTHROSCOPIC SUBACROMIAL DECOMP Right 09/24/2019   Procedure: R- ARTHROSCOPY, SHOULDER, SURGICAL; DECOMPRESSION SUBACROMIAL SPACE W/PARTIAL ACROMIOPLASTY, W/CORACOACROMIAL LIGAMENT RELEASE, WHEN PERFORMED (LIST IN ADDITION TO PRIMARY PROCEDURE);  Surgeon: Klifto, Christopher Scott, MD;  Location: [facility] OR;  Service: Orthopedics;  Laterality:  Right;  . ARTHRODESIS ANTERIOR CERVICLE SPINE N/A 11/12/2019   Procedure: ARTHRODESIS ANTERIOR CERVICAL SPINE;  Surgeon: Rod Eleanor HERO, MD;  Location: [facility];  Service: Orthopedics;  Laterality: N/A;  . ARTHRODESIS ANTERIOR CERVICLE SPINE N/A 11/12/2019   Procedure: ARTHRODESIS ANT INTERBODY INC DISCECTOMY, CERVICAL BELOW C2 EACH ADDL;  Surgeon: Rod Eleanor HERO, MD;  Location: [facility];  Service: Orthopedics;  Laterality: N/A;  . INSTRUMENTATION ANTERIOR SPINE 8/MORE SEGMENTS N/A 11/12/2019   Procedure: ANTERIOR INSTRUMENTATION; 4 TO 7 VERTEBRAL SEGMENTS (LIST IN ADDITION TO PRIMARY PROCEDURE);  Surgeon: Rod Eleanor HERO, MD;  Location: [facility];  Service: Orthopedics;  Laterality: N/A;  . INSERTION INTERBODY BIOMECHANICAL DEVICE W/ INSTRUMENTATION N/A 11/12/2019   Procedure: INSERTION INTERBODY BIOMECHANICAL DEVICE WITH INTEGRAL ANTERIOR INSTRUMENTATION, TO INTERVERTEBRAL DISC SPACE IN CONJUNCTION INTERBODY ARTHRODESIS, EACH INTERSPACE (LIST CODE FOR PRIMARY PROCEDURE);  Surgeon: Rod Eleanor HERO, MD;  Location: [facility];  Service: Orthopedics;  Laterality: N/A;  . AUTOGRAFT OBTAINED SAME INCISION FOR SPINE SURGERY N/A 09/22/2020   Procedure: AUTOGRAFT OBTAINED SAME INCISION FOR SPINE SURGERY;  Surgeon: Rod Eleanor HERO, MD;  Location: [facility];  Service: Orthopedics;  Laterality: N/A;  . LAMINECTOMY POSTERIOR CERVICLE &/DECOMPRESSION  MORE THAN 2 SEGMENTS N/A 09/22/2020   Procedure: LAMINECTOMY POSTERIOR CERVICLE &/DECOMPRESSION  MORE THAN 2 SEGMENTS;  Surgeon: Rod Eleanor HERO, MD;  Location: [facility];  Service: Orthopedics;  Laterality: N/A;  . INSTRUMENTATION POSTERIOR SPINE 7 TO 12 VERTEBRAL SEGMENTS N/A 09/22/2020   Procedure: INSTRUMENTATION POSTERIOR SPINE 3 TO 6 VERTEBRAL SEGMENTS;  Surgeon: Rod Eleanor HERO, MD;  Location: [facility];  Service: Orthopedics;  Laterality: N/A;  . POSTERIOR  THORACIC SPINE FUSION/ARTHRODESIS ONE LEVEL N/A 09/22/2020   Procedure: ARTHRODESIS, POSTERIOR OR POSTEROLATERAL TECHNIQUE, SINGLE LEVEL; CERVICAL BELOW C2 SEGMENT;  Surgeon: Rod Eleanor HERO, MD;  Location: [facility];  Service: Orthopedics;  Laterality: N/A;  . ARTHRODESIS POSTERIOR SPINE N/A 09/22/2020   Procedure: ARTHRODESIS POSTERIOR SPINE ADDITIONAL FIRST;  Surgeon: Rod Eleanor HERO, MD;  Location: [facility];  Service: Orthopedics;  Laterality: N/A;  . BONE SPUR REMOVAL RIGHT FOOT    . CARPAL TUNNEL RELEASE    . CESAREAN SECTION     THREE  . CYST REMOVAL RIGHT WRIST Right   . ENDOMETRIAL ABLATION    . JOINT REPLACEMENT Right    knee  . KNEE ARTHROSCOPY Right   . SKIN BIOPSY Left    leg - melanoma  . TUBAL LIGATION       Past Family History: Family History  Problem Relation Age of Onset  . Diabetes type II Mother   . High blood pressure (Hypertension) Mother   . Stroke Father   . Diabetes Brother        diabetic coma twin brother  . Sleep apnea Sister   . Anesthesia problems Neg Hx   . Malignant hyperthermia Neg Hx     Social History: Social History   Socioeconomic History  . Marital status: Single  . Number of children: 3  Occupational History  . Occupation: Unemployed  Tobacco Use  . Smoking status: Former    Current packs/day: 0.00    Average packs/day: 0.3 packs/day for 32.0 years (8.0 ttl  pk-yrs)    Types: Cigarettes    Start date: 09/23/1987    Quit date: 09/23/2019    Years since quitting: 4.5    Passive exposure: Yes  . Smokeless tobacco: Former    Quit date: 09/18/2020  Vaping Use  . Vaping status: Never Used  Substance and Sexual Activity  . Alcohol  use: Not Currently    Comment: occ; quit September 19, 2019  . Drug use: Never  . Sexual activity: Not Currently    Partners: Male    Birth control/protection: Post-menopausal  Social History Narrative   09/26/2023      Persons in the home: lives in a long term care facility    Family: 3 kids- [place], [place], [place]   Mom visits once a week   Home: One Story   Your Bedrooms is on: First Level   Pets: none   Teacher, early years/pre you use daily: Agricultural consultant      Social Drivers of Health   Financial Resource Strain: Patient Declined (04/02/2024)   Overall Financial Resource Strain (CARDIA)   . Difficulty of Paying Living Expenses: Patient declined  Food Insecurity: No Food Insecurity (04/02/2024)   Hunger Vital Sign   . Worried About Programme researcher, broadcasting/film/video in the Last Year: Never true   . Ran Out of Food in the Last Year: Never true  Transportation Needs: No Transportation Needs (04/02/2024)   PRAPARE - Transportation   . Lack of Transportation (Medical): No   . Lack of Transportation (Non-Medical): No  Physical Activity: Inactive (09/26/2023)   Exercise Vital Sign   . Days of Exercise per Week: 0 days   . Minutes of Exercise per Session: 0 min  Housing Stability: Unknown (04/02/2024)   Housing Stability Vital Sign   . Unable to Pay for Housing in the Last Year: Patient declined   . Number of Times Moved in the Last Year: 0   . Homeless in the Last Year: No     Medications: Current Outpatient Medications  Medication Sig Dispense Refill  . acetaminophen  (TYLENOL ) 500 mg capsule Take 2 capsules (1,000 mg total) by mouth every 6 (six) hours as needed for Pain 30 capsule 0  . albuterol  90 mcg/actuation inhaler Inhale 2 inhalations into the lungs every 4 (four) hours as needed    . apixaban  (ELIQUIS ) 5 mg tablet Take 1 tablet (5 mg total) by mouth every 12 (twelve) hours    . ascorbic acid , vitamin C, (VITAMIN C) 500 MG tablet Take 500 mg by mouth 2 (two) times daily    . aspirin  81 MG chewable tablet Take 81 mg by mouth once daily    . atorvastatin  (LIPITOR) 10 MG tablet Take 10 mg by mouth once daily    . baclofen  (LIORESAL ) 5 mg tablet Take 5 mg by mouth 2 (two) times daily    . benzonatate  (TESSALON ) 100 MG capsule Take 100 mg  by mouth once daily    . bisacodyL  (DULCOLAX) 10 mg suppository Place 10 mg rectally once daily as needed for Constipation    . buprenorphine HCL (SUBUTEX) 2 mg SL tablet Place 4 mg under the tongue once daily    . cholecalciferol 1000 unit tablet Take 1,000 Units by mouth once daily    . cranberry conc-ascorbic acid  (CRANBERRY CONCENTRATE) 140-100 mg Cap Take 1 capsule by mouth 2 (two) times daily    . d-mannose 500 mg Cap Take 1 capsule by mouth once daily AZO OTC    .  dextromethorphan -guaiFENesin  (MUCINEX  DM) 30-600 mg ER tablet Take 1 tablet by mouth every 12 (twelve) hours    . DULoxetine  (CYMBALTA ) 30 MG DR capsule Take 2 capsules (60 mg total) by mouth once daily (Patient taking differently: Take 30 mg by mouth once daily Pt taking 60mg  + 30mg  = 90mg  qd) 90 capsule 0  . DULoxetine  (CYMBALTA ) 60 MG DR capsule Take 60 mg by mouth once daily Pt taking 60mg  + 30mg  = 90mg  qd    . ergocalciferol, vitamin D2, 50 mcg (2,000 unit) Tab Take 1 tablet by mouth once daily    . ertapenem [facility]) injection Inject 1 g into the vein daily Pharmacy to determine diluent , concentration, and rate of administration.    . fluorometholone  (FML) 0.1 % ophthalmic suspension Place 1 drop into the right eye as needed    . fluticasone  propionate (FLONASE ) 50 mcg/actuation nasal spray Place 1 spray into both nostrils once daily as needed    . LACTULOSE  ORAL Take 30 mLs by mouth once daily as needed    . lidocaine  HCL 4 % Crea Apply topically once daily    . LINZESS  290 mcg capsule Take 1 capsule (290 mcg total) by mouth once daily 90 capsule 3  . loratadine (CLARITIN) 10 mg tablet Take 10 mg by mouth once daily as needed    . meclizine  (ANTIVERT ) 25 mg tablet Take 25 mg by mouth once daily as needed    . metFORMIN  (GLUCOPHAGE ) 500 MG tablet Take 500 mg by mouth 2 (two) times daily with meals    . methenamine hippurate (HIPREX) 1 gram tablet Take 1 tablet (1 g total) by mouth 2 (two) times daily 180 tablet 3  .  methocarbamoL  (ROBAXIN ) 500 MG tablet Take by mouth    . mineral oil-hydrophilic petrolatum (AQUAPHOR) ointment Apply topically as needed for Dry Skin    . multivitamin tablet Take 1 tablet by mouth once daily    . naloxone (NARCAN) 4 mg/actuation nasal spray Place 4 mg into one nostril 3 (three) times daily as needed (Three times daily)    . omeprazole (PRILOSEC) 20 MG DR capsule Take 2 capsules (40 mg total) by mouth once daily 40mg  per med rec 01.31.2024 180 capsule 3  . ondansetron  (ZOFRAN ) 4 MG tablet Take 4 mg by mouth every 6 (six) hours as needed    . oxyCODONE  (ROXICODONE ) 5 MG immediate release tablet Take 1 tablet by mouth every 4 (four) hours as needed    . OZEMPIC 0.25 mg or 0.5 mg (2 mg/3 mL) pen injector Inject 0.25 mg subcutaneously once a week    . polyethylene glycol (MIRALAX ) powder Take 17 g by mouth once daily    . polyethylene glycol 400 1 % Drop Apply 2 drops to eye once daily Visine OTC    . pregabalin  (LYRICA ) 150 MG capsule Take 150 mg by mouth 2 (two) times daily    . promethazine  (PHENERGAN ) 12.5 MG tablet Take 12.5 mg by mouth every 6 (six) hours as needed for Nausea    . saliva stimulant comb. no.6 Lozg by Mucous Membrane route 3 (three) times daily as needed    . semaglutide (WEGOVY) 0.25 mg/0.5 mL pen injector Inject 0.5 mLs (0.25 mg total) subcutaneously once a week 2 mL 0  . sennosides-docusate (SENOKOT-S) 8.6-50 mg tablet Take 1 tablet by mouth once daily as needed    . sodium chloride  0.9% infusion     . tiZANidine  (ZANAFLEX ) 2 MG tablet Take 2 mg by  mouth 2 (two) times daily    . traZODone  (DESYREL ) 50 MG tablet Take 50 mg by mouth at bedtime    . estradioL (ESTRING) 2 mg (7.5 mcg /24 hour) vaginal ring Place 1 Ring (2 mg total) vaginally every 3 (three) months for 1 dose Following insertion, ring should remain in place for 90 days. Then, replace. 1 Ring 4   No current facility-administered medications for this visit.    Allergies: Allergies  Allergen  Reactions  . Buprenorphine Hcl Itching  . Melatonin Anxiety    agitation  . Neomycin Sulfate Other (See Comments)    Positive patch test  . Sulfa (Sulfonamide Antibiotics) Itching     Review of Systems:  A ROS was performed including pertinent positives and negative as documented in the HPI.  Physical Exam: BP 128/82 (BP Location: Left forearm, Patient Position: Sitting, BP Cuff Size: Large Adult)   Pulse 71   LMP 11/05/2016   SpO2 98%    CERVICAL SPINE EXAM:  ROM: Normal Tenderness: upper trapezius bilaterally   Strength: Cervical Right Left  Deltoid 2/5 4/5  External rotation 0/5 3/5  Biceps 4/5 5/5  Triceps 4/5 5/5  Wrist Extensor 3/5 5/5  Wrist Flexor 0/5 5/5  FDP 0/5 3/5  Interossei 0/5 3/5    Sensation: Decreased sensation to light touch in the thumb on the right.  Normal to light touch bilateral upper extremity for all other areas  Imaging: X-ray of the cervical spine dated 04/26/2023 was reviewed: FINDINGS/IMPRESSION:  The cervical spine is well demonstrated from the base the skull to the C4 vertebral body. Status post C4-C6 ACDF and C5-C7 posterior spinal fusion in similar alignment. No evidence for perihardware lucency or fractures. Similar osseous fusion of the C4-C6 vertebral bodies. Similar mild multilevel cervical spondylosis most prominent at C3-C4. No significant listhesis. No evidence for acute compression fracture deformity. No prevertebral soft tissue thickening.  I have reviewed/interpreted the above images myself and concur with the radiology report  Assessment:    ICD-10-CM  1. Radiculopathy of cervical region  M54.12    Plan: I believe JEILANI GRUPE has myofascial pain vs cervical radicular pain due to adjacent level diease above her fusion at C3-4.  The findings were discussed with the patient.  I discussed different treatment options.   I have recommended MRI of cervical spine.   All questions and concerns were answered.  She can call any  time with further concerns.   She will follow up following testing MRI.  I spent a total of 45 minutes in both face-to-face and non-face-to-face activities, excluding procedures performed, for this visit on the date of this encounter.  Attestation Statement:   I personally performed the service, non-incident to. (WP)   JOSHUA PRENTICE SHARPS, PA

## 2024-04-09 ENCOUNTER — Other Ambulatory Visit: Payer: Self-pay

## 2024-04-09 ENCOUNTER — Emergency Department
Admission: EM | Admit: 2024-04-09 | Discharge: 2024-04-09 | Disposition: A | Discharge status: Home / Self Care | Attending: Emergency Medicine | Admitting: Emergency Medicine

## 2024-04-09 ENCOUNTER — Emergency Department

## 2024-04-09 DIAGNOSIS — I10 Essential (primary) hypertension: Secondary | ICD-10-CM | POA: Diagnosis not present

## 2024-04-09 DIAGNOSIS — M5412 Radiculopathy, cervical region: Secondary | ICD-10-CM | POA: Insufficient documentation

## 2024-04-09 DIAGNOSIS — M25511 Pain in right shoulder: Secondary | ICD-10-CM | POA: Insufficient documentation

## 2024-04-09 HISTORY — DX: Essential (primary) hypertension: I10

## 2024-04-09 HISTORY — DX: Type 2 diabetes mellitus without complications: E11.9

## 2024-04-09 MED ORDER — OXYCODONE HCL 5 MG PO TABS: 5.0000 mg | ORAL_TABLET | Freq: Three times a day (TID) | ORAL | 0 refills | Status: AC | PRN

## 2024-04-09 MED ORDER — PREDNISONE 20 MG PO TABS
60.0000 mg | ORAL_TABLET | Freq: Once | ORAL | Status: AC
  Administered 2024-04-09: 60 mg via ORAL
  Filled 2024-04-09: qty 3

## 2024-04-09 MED ORDER — OXYCODONE HCL 5 MG PO TABS
5.0000 mg | ORAL_TABLET | Freq: Once | ORAL | Status: AC
  Administered 2024-04-09: 5 mg via ORAL
  Filled 2024-04-09: qty 1

## 2024-04-09 MED ORDER — PREDNISONE 10 MG (21) PO TBPK: ORAL_TABLET | ORAL | 0 refills | Status: AC

## 2024-04-09 NOTE — ED Notes (Signed)
 Tech advised the pt was ok and did not need any assistance changing.

## 2024-04-09 NOTE — Discharge Instructions (Addendum)
 Please do not take prednisone  even if it arrives this evening.  I have given you your dose for the day while here in the emergency department.  Keep your appointment for MRI of your neck once it is scheduled.  Discussed symptoms with primary care or orthopedics if medications prescribed today are not helping with your pain.

## 2024-04-09 NOTE — ED Provider Notes (Signed)
 [facility] Provider Note    Event Date/Time   First MD Initiated Contact with Patient 04/09/24 1427     (approximate)   History   Shoulder Pain   HPI  Wendy Nelson is a 58 y.o. female with history of hypertension, hyperlipidemia, neurogenic bladder, quadriplegia and as listed in EMR presents to the emergency department for treatment and evaluation of right shoulder pain for the past 3 weeks. No known injury. Symptoms started after traveling to [place]. Ultrasound yesterday ruled out DVT. She is scheduled for an MRI.  Pain is in the superior aspect of the right shoulder and occasionally radiates into the right index finger.     Physical Exam    Vitals:   04/09/24 1709 04/09/24 1745  BP: (!) 134/102 124/86  Pulse: 73   Resp: 16   Temp: 97.9 F (36.6 C)   SpO2: 92%     General: Awake, no distress.  CV:  Good peripheral perfusion.  Resp:  Normal effort.  Abd:  No distention.  Other:  Range of motion of right upper extremity limited due to CVA.  Patient able to demonstrate flexion and extension at elbow without report of increase of pain.  She is unable to abduct internally or externally rotate shoulder also secondary to residual effect of CVA.  Tenderness diffuse from superior shoulder and around upper arm.  No wounds or lesions noted.   ED Results / Procedures / Treatments   Labs (all labs ordered are listed, but only abnormal results are displayed)  Labs Reviewed - No data to display   EKG  Not indicated   RADIOLOGY  Image and radiology report reviewed and interpreted by me. Radiology report consistent with the same.  Image of the right shoulder shows mild shoulder osteoarthritis but no acute concerns.  PROCEDURES:  Critical Care performed: No  Procedures   MEDICATIONS ORDERED IN ED:  Medications  predniSONE  (DELTASONE ) tablet 60 mg (60 mg Oral Given 04/09/24 1539)  oxyCODONE  (Oxy IR/ROXICODONE ) immediate release tablet 5 mg  (5 mg Oral Given 04/09/24 1539)     IMPRESSION / MDM / ASSESSMENT AND PLAN / ED COURSE   I have reviewed the triage note and vital signs. Vital signs stable   Differential diagnosis includes, but is not limited to, musculoskeletal pain, osteoarthritis, cervical radiculopathy.  Patient's presentation is most consistent with acute illness / injury with system symptoms.  58 year old female presenting to the emergency department for treatment and evaluation of right shoulder pain that radiates into the right index finger.  See HPI for further details.  X-ray shows mild osteoarthritis of the right shoulder.  Patient symptoms are most likely related to cervical radiculopathy for which the patient is awaiting insurance approval for MR of the cervical spine.  Plan will be to treat her with prednisone  taper and a short course of pain medication.  Patient agreeable to this plan.      FINAL CLINICAL IMPRESSION(S) / ED DIAGNOSES   Final diagnoses:  Acute pain of right shoulder  Cervical radiculopathy, acute     Rx / DC Orders   ED Discharge Orders          Ordered    predniSONE  (STERAPRED UNI-PAK 21 TAB) 10 MG (21) TBPK tablet        04/09/24 1542    oxyCODONE  (ROXICODONE ) 5 MG immediate release tablet  Every 8 hours PRN        04/09/24 1542  Note:  This document was prepared using Dragon voice recognition software and may include unintentional dictation errors.   Herlinda Kirk NOVAK, FNP 04/09/24 1805    Claudene Rover, MD 04/10/24 [phone]

## 2024-04-09 NOTE — ED Triage Notes (Signed)
 [facility] x3 weeks. Reports has been using pain patches.

## 2024-05-23 ENCOUNTER — Emergency Department
Admission: EM | Admit: 2024-05-23 | Discharge: 2024-05-23 | Disposition: A | Discharge status: Home / Self Care | Attending: Emergency Medicine | Admitting: Emergency Medicine

## 2024-05-23 ENCOUNTER — Other Ambulatory Visit: Payer: Self-pay

## 2024-05-23 DIAGNOSIS — T83091A Other mechanical complication of indwelling urethral catheter, initial encounter: Secondary | ICD-10-CM | POA: Diagnosis present

## 2024-05-23 DIAGNOSIS — I10 Essential (primary) hypertension: Secondary | ICD-10-CM | POA: Diagnosis not present

## 2024-05-23 DIAGNOSIS — Y732 Prosthetic and other implants, materials and accessory gastroenterology and urology devices associated with adverse incidents: Secondary | ICD-10-CM | POA: Diagnosis not present

## 2024-05-23 DIAGNOSIS — T839XXA Unspecified complication of genitourinary prosthetic device, implant and graft, initial encounter: Secondary | ICD-10-CM

## 2024-05-23 NOTE — ED Notes (Signed)
 Call placed to [facility] [place] spoke with Kim,RN report given.

## 2024-05-23 NOTE — ED Notes (Signed)
 Transportation at bedside.

## 2024-05-23 NOTE — ED Provider Notes (Addendum)
 [facility] Provider Note    Event Date/Time   First MD Initiated Contact with Patient 05/23/24 1610     (approximate)   History   foley reinsertion   HPI  Wendy Nelson is a 58 y.o. female  hypertension, hyperlipidemia, neurogenic bladder, quadriplegia       Patient reports she is here just to have her Foley catheter replaced.  She has it switched out about every 30 days through the urogynecology clinic at [facility].  However today at her care facility it was noted to have come out.  No pain or discomfort no other symptoms.  She has a chronic Foley catheter.  She does tell me that if about 5 days ago she had some nausea and some stomach pain, and she has been seen evaluate by her medical team at [facility].  They did an x-ray and noted she had an ileus.  She was started on a clear liquid diet and she is actually been doing very well without, she has been having normal bowel movements, she is taking clear diet without issue and the pain and nausea have resolved.  She otherwise has not any fevers chills or other symptoms.  She has chronic paralysis of the lower extremities and does have decreased sensation in those areas as well.   Today she was getting her normal bath and care when the attendant noted that her urinary catheter had come out.  Physical Exam   Triage Vital Signs: ED Triage Vitals  Encounter Vitals Group     BP 05/23/24 1553 107/78     Girls Systolic BP Percentile --      Girls Diastolic BP Percentile --      Boys Systolic BP Percentile --      Boys Diastolic BP Percentile --      Pulse Rate 05/23/24 1553 68     Resp 05/23/24 1553 18     Temp 05/23/24 1553 (!) 97.2 F (36.2 C)     Temp Source 05/23/24 1553 Oral     SpO2 05/23/24 1553 97 %     Weight 05/23/24 1554 273 lb (123.8 kg)     Height 05/23/24 1554 5' 5 (1.651 m)     Head Circumference --      [facility] Flow --      Pain Score 05/23/24 1554 0     Pain Loc --      Pain Education --       Exclude from Growth Chart --     Most recent vital signs: Vitals:   05/23/24 1553  BP: 107/78  Pulse: 68  Resp: 18  Temp: (!) 97.2 F (36.2 C)  SpO2: 97%     General: Awake, no distress.  She is very pleasant CV:  Good peripheral perfusion.  Normal tone Resp:  Normal effort.  Clear Abd:  No distention.  Soft nontender nondistended.  Bowel sounds are normal.  She denies any nausea.  Foley catheter has been placed by nursing staff, has drained approximately 50 mL of yellowish urine at this time.  Appears to be functioning well. Other:  Of note nursing noted a small foreign body noted at the vaginal introitus which was removed.  I examined it was placed into a specimen cup and it does appear to be physiologic, it appears to be what I think is probably the edge of a Band-Aid.  However of course I do not have exact certainty.   ED Results / Procedures /  Treatments   Labs (all labs ordered are listed, but only abnormal results are displayed) Labs Reviewed  URINE CULTURE     EKG     RADIOLOGY     PROCEDURES:  Critical Care performed: No  Procedures   MEDICATIONS ORDERED IN ED: Medications - No data to display   IMPRESSION / MDM / ASSESSMENT AND PLAN / ED COURSE  I reviewed the triage vital signs and the nursing notes.                              Differential diagnosis includes, but is not limited to, Foley catheter reinsertion.  Patient's presentation is most consistent with acute medical issue requireing procedure / replacement of medical device.   Patient reports a recent ileus but appears her medical teams been taking good care of her.  Her symptoms are improving and they are continue to follow her.  She tells me they have a plan to reevaluate and repeat her x-ray on Monday under [facility] is [facility].  She has no concerns around that, and feels like she would like to return to [facility] now that the catheters been replaced to  continue her care.  I did discuss that we could certainly evaluate further if she has concerns or worsening pain nausea vomiting etc., but this point she feels much better and reports that clear liquid diet and treatment Piche's been effective.  She has very reassuring examination.  EMS reports she had hypotension, her blood pressure when I see her approximately 415 is 144 systolic she is awake alert oriented I rechecked her oral temperature is 98.36F.  She has no fever shakes or systemic symptoms.     Discussed goals of care with the patient today, she feels very confident that she is feeling better the symptoms that she had abdominal pain and what was diagnosed as a ileus per the patient are getting better she has been on a broth diet and she feels much better.  She does not have any fevers or chills.  She reports she just came today to have the catheter replaced and wants to continue her care team at [facility].  I rechecked her temp her oral temp is 97.9, she is asymptomatic, blood pressure 144 systolic normal heart rate.  She is awake alert and stable.  She advises she will continue care with her medical team at [facility], and I think this is very much agreeable.  Return precautions and treatment recommendations and follow-up discussed with the patient who is agreeable with the plan.   FINAL CLINICAL IMPRESSION(S) / ED DIAGNOSES   Final diagnoses:  Foley catheter problem, initial encounter     Rx / DC Orders   ED Discharge Orders     None        Note:  This document was prepared using Dragon voice recognition software and may include unintentional dictation errors.   Dicky Anes, MD 05/23/24 1656    Dicky Anes, MD 05/23/24 [phone]

## 2024-05-23 NOTE — Discharge Instructions (Signed)
 Please follow-up with your medical team within the next 1 to 2 days at [facility] as you are already planning.   Please return to the emergency room right away if you are to develop a fever, severe nausea, pain becomes present anywhere, you are unable to keep food down, begin vomiting any dark or bloody fluid, you develop any dark or bloody stools, feel dehydrated, or other new concerns or symptoms arise.

## 2024-05-23 NOTE — ED Triage Notes (Signed)
 Pt coming by EMS from [facility] [place] with complaints of her foley catheter coming out and needing to be reinserted. Staff at the facility is unable to replace the foley. Initial BP upon arrival 89/57. 300cc normal saline given en route.  BP: 98/58 Temp: 97.8 O2: 96% RR: 22 HR: 71 Capno: 50 CBG: 84

## 2024-07-18 ENCOUNTER — Other Ambulatory Visit: Payer: Self-pay

## 2024-07-18 ENCOUNTER — Emergency Department
Admission: EM | Admit: 2024-07-18 | Discharge: 2024-07-19 | Disposition: A | Discharge status: Skilled Nursing Facility | Attending: Emergency Medicine | Admitting: Emergency Medicine

## 2024-07-18 DIAGNOSIS — I959 Hypotension, unspecified: Secondary | ICD-10-CM | POA: Diagnosis present

## 2024-07-18 DIAGNOSIS — N390 Urinary tract infection, site not specified: Secondary | ICD-10-CM | POA: Insufficient documentation

## 2024-07-18 DIAGNOSIS — B9689 Other specified bacterial agents as the cause of diseases classified elsewhere: Secondary | ICD-10-CM | POA: Insufficient documentation

## 2024-07-18 DIAGNOSIS — E119 Type 2 diabetes mellitus without complications: Secondary | ICD-10-CM | POA: Insufficient documentation

## 2024-07-18 DIAGNOSIS — I10 Essential (primary) hypertension: Secondary | ICD-10-CM | POA: Insufficient documentation

## 2024-07-18 LAB — URINALYSIS, COMPLETE (UACMP) WITH MICROSCOPIC
Bilirubin Urine: NEGATIVE
Glucose, UA: NEGATIVE mg/dL
Ketones, ur: NEGATIVE mg/dL
Nitrite: NEGATIVE
Protein, ur: 30 mg/dL — AB
Specific Gravity, Urine: 1.004 — ABNORMAL LOW (ref 1.005–1.030)
WBC, UA: >50 WBC/hpf (ref 0–5)
pH: 6 (ref 5.0–8.0)

## 2024-07-18 LAB — COMPREHENSIVE METABOLIC PANEL WITH GFR
ALT: 17 U/L (ref 0–44)
AST: 24 U/L (ref 15–41)
Albumin: 3.1 g/dL — ABNORMAL LOW (ref 3.5–5.0)
Alkaline Phosphatase: 125 U/L (ref 38–126)
Anion gap: 12 (ref 5–15)
BUN: 14 mg/dL (ref 6–20)
CO2: 26 mmol/L (ref 22–32)
Calcium: 8.4 mg/dL — ABNORMAL LOW (ref 8.9–10.3)
Chloride: 101 mmol/L (ref 98–111)
Creatinine, Ser: 0.88 mg/dL (ref 0.44–1.00)
GFR, Estimated: >60 mL/min
Glucose, Bld: 189 mg/dL — ABNORMAL HIGH (ref 70–99)
Potassium: 3.9 mmol/L (ref 3.5–5.1)
Sodium: 139 mmol/L (ref 135–145)
Total Bilirubin: 0.2 mg/dL (ref 0.0–1.2)
Total Protein: 6.5 g/dL (ref 6.5–8.1)

## 2024-07-18 LAB — TROPONIN T, HIGH SENSITIVITY: Troponin T High Sensitivity: 20 ng/L — ABNORMAL HIGH (ref 0–19)

## 2024-07-18 LAB — CBC
HCT: 35.8 % — ABNORMAL LOW (ref 36.0–46.0)
Hemoglobin: 11.1 g/dL — ABNORMAL LOW (ref 12.0–15.0)
MCH: 27.3 pg (ref 26.0–34.0)
MCHC: 31 g/dL (ref 30.0–36.0)
MCV: 88 fL (ref 80.0–100.0)
Platelets: 340 K/uL (ref 150–400)
RBC: 4.07 MIL/uL (ref 3.87–5.11)
RDW: 15.6 % — ABNORMAL HIGH (ref 11.5–15.5)
WBC: 12 K/uL — ABNORMAL HIGH (ref 4.0–10.5)
nRBC: 0 % (ref 0.0–0.2)

## 2024-07-18 LAB — CBG MONITORING, ED: Glucose-Capillary: 196 mg/dL — ABNORMAL HIGH (ref 70–99)

## 2024-07-18 MED ORDER — SODIUM CHLORIDE 0.9 % IV BOLUS
1000.0000 mL | Freq: Once | INTRAVENOUS | Status: AC
  Administered 2024-07-18: 1000 mL via INTRAVENOUS

## 2024-07-18 MED ORDER — SODIUM CHLORIDE 0.9 % IV SOLN
1.0000 g | Freq: Once | INTRAVENOUS | Status: AC
  Administered 2024-07-18: 1 g via INTRAVENOUS
  Filled 2024-07-18: qty 10

## 2024-07-18 MED ORDER — CEPHALEXIN 500 MG PO CAPS: 500.0000 mg | ORAL_CAPSULE | Freq: Two times a day (BID) | ORAL | 0 refills | Status: DC

## 2024-07-18 NOTE — ED Notes (Signed)
 Assisted primary RN with bladder irrigation due to decreased Foley output. Pt's Foley has air leak when inflating balloon and irrigation fluid leaks around catheter. MD notified.

## 2024-07-18 NOTE — ED Triage Notes (Signed)
 Pt arrives via [facility] from [facility] for low blood pressure. 74/42 BP with [facility] and 90/59 BP with EMS. CBG 215 with EMS whioch is abnormal for pt. Hx of hypertension and hypotension.

## 2024-07-18 NOTE — Discharge Instructions (Addendum)
 Please take your antibiotics as prescribed for the next 7 days.  Please drink plenty fluids.  Return to the emergency department for any symptom personally concerning to yourself.

## 2024-07-18 NOTE — ED Notes (Signed)
 Unable to obtain second IV access due to pt's hypotension and previous EMS attempts.

## 2024-07-18 NOTE — ED Provider Notes (Signed)
 "  [facility] Provider Note    Event Date/Time   First MD Initiated Contact with Patient 07/18/24 2034     (approximate)  History   Chief Complaint: Hypotension  HPI  Wendy Nelson is a 59 y.o. female with a past medical history of diabetes, hypertension, prior CVA/quadriplegia, presents to the emergency department for hypotension.  According to the patient and record review they checked her blood pressure tonight and noted it to be low.  Patient states her blood pressure often times will fluctuate sometimes low sometimes elevated.  States she has been feeling somewhat nauseated recently but denies any vomiting no known diarrhea.  Patient denies any pain.  Blood pressure in the emergency department currently 86/38.  Physical Exam   Triage Vital Signs: ED Triage Vitals  Encounter Vitals Group     BP 07/18/24 2029 (!) 86/38     Girls Systolic BP Percentile --      Girls Diastolic BP Percentile --      Boys Systolic BP Percentile --      Boys Diastolic BP Percentile --      Pulse Rate 07/18/24 2029 94     Resp 07/18/24 2029 (!) 23     Temp 07/18/24 2029 98.6 F (37 C)     Temp Source 07/18/24 2029 Oral     SpO2 07/18/24 2029 98 %     Weight 07/18/24 2030 291 lb 6.4 oz (132.2 kg)     Height 07/18/24 2030 5' 5 (1.651 m)     Head Circumference --      [facility] Flow --      Pain Score 07/18/24 2030 0     Pain Loc --      Pain Education --      Exclude from Growth Chart --     Most recent vital signs: Vitals:   07/18/24 2029  BP: (!) 86/38  Pulse: 94  Resp: (!) 23  Temp: 98.6 F (37 C)  SpO2: 98%    General: Awake, no distress.  CV:  Good peripheral perfusion.  Regular rate and rhythm  Resp:  Normal effort.  Equal breath sounds bilaterally.  Abd:  No distention.  Soft, nontender.  No rebound or guarding.  ED Results / Procedures / Treatments   EKG  EKG viewed and interpreted by myself shows a normal sinus rhythm at 94 bpm with a narrow QRS,  normal axis, normal intervals, no concerning ST changes.   MEDICATIONS ORDERED IN ED: Medications  sodium chloride  0.9 % bolus 1,000 mL (has no administration in time range)     IMPRESSION / MDM / ASSESSMENT AND PLAN / ED COURSE  I reviewed the triage vital signs and the nursing notes.  Patient's presentation is most consistent with acute presentation with potential threat to life or bodily function.  Patient presents to the emergency department from her nursing facility for low blood pressure.  Patient is quadriplegic, denies any pain.  Does have a chronic catheter.  Blood pressure currently 86/38 in the emergency department blood pressure cuff may be somewhat limited due to body habitus we will recheck a forearm pressure.  We will IV hydrate we will check labs including a CBC and a chemistry.  Will attempt to obtain a urine sample and continue to closely monitor.  Patient agreeable to plan of care.  She has no concerns currently.  Patient's lab work is reassuring CBC shows no concerning findings.  Chemistry is reassuring with normal renal function.  Troponin slightly elevated at 20 but not concerning only so.  Urinalysis pending.  Patient's blood pressure has been as high as 117/79 with a forearm pressure.  Seems to be maintaining around 100-105 for the most part.  I reviewed the patient's past ER visits her blood pressure has largely ranged around 100-110 systolic this does not appear to be significantly off her baseline.  Awaiting urinalysis results we will likely cover with antibiotics if infected and discharged back to her care facility.  Patient agreeable to plan.  Urinalysis shows urinary tract infection with a new Foley catheter.  Old Foley catheter was removed and new 1 was placed prior to obtaining a sample.  Will dose IV Rocephin  and discharged on Keflex .  Patient will be ready for discharge upon completion of the Rocephin .  FINAL CLINICAL IMPRESSION(S) / ED DIAGNOSES    Hypotension Urinary tract infection   Note:  This document was prepared using Dragon voice recognition software and may include unintentional dictation errors.   Dorothyann Drivers, MD 07/18/24 2322  "

## 2024-07-19 MED ORDER — CEPHALEXIN 500 MG PO CAPS: 500.0000 mg | ORAL_CAPSULE | Freq: Two times a day (BID) | ORAL | 0 refills | Status: AC

## 2024-07-19 NOTE — ED Notes (Signed)
 This RN called [facility] and spoke with Lupita, CHAR[facility]. She was given a full report and told that the pt will be arriving by [facility] with not ETA yet.

## 2024-07-19 NOTE — ED Notes (Signed)
 This RN called [facility] to inform them that pt is on her way back to the facility at this time
